# Patient Record
Sex: Female | Born: 1960
Health system: Southern US, Community
[De-identification: ages and names within clinical notes are randomized; demographics above are authoritative.]

## PROBLEM LIST (undated history)

## (undated) DIAGNOSIS — T7840XA Allergy, unspecified, initial encounter: Secondary | ICD-10-CM

## (undated) DIAGNOSIS — D682 Hereditary deficiency of other clotting factors: Secondary | ICD-10-CM

## (undated) DIAGNOSIS — M1990 Unspecified osteoarthritis, unspecified site: Secondary | ICD-10-CM

## (undated) DIAGNOSIS — T4145XA Adverse effect of unspecified anesthetic, initial encounter: Secondary | ICD-10-CM

## (undated) DIAGNOSIS — N3281 Overactive bladder: Secondary | ICD-10-CM

## (undated) DIAGNOSIS — J45909 Unspecified asthma, uncomplicated: Secondary | ICD-10-CM

## (undated) DIAGNOSIS — E785 Hyperlipidemia, unspecified: Secondary | ICD-10-CM

## (undated) DIAGNOSIS — E119 Type 2 diabetes mellitus without complications: Secondary | ICD-10-CM

## (undated) DIAGNOSIS — K219 Gastro-esophageal reflux disease without esophagitis: Secondary | ICD-10-CM

## (undated) DIAGNOSIS — F3289 Other specified depressive episodes: Secondary | ICD-10-CM

## (undated) DIAGNOSIS — G5601 Carpal tunnel syndrome, right upper limb: Secondary | ICD-10-CM

## (undated) DIAGNOSIS — I10 Essential (primary) hypertension: Secondary | ICD-10-CM

## (undated) DIAGNOSIS — R011 Cardiac murmur, unspecified: Secondary | ICD-10-CM

## (undated) DIAGNOSIS — F329 Major depressive disorder, single episode, unspecified: Secondary | ICD-10-CM

## (undated) DIAGNOSIS — R0600 Dyspnea, unspecified: Secondary | ICD-10-CM

## (undated) DIAGNOSIS — T8859XA Other complications of anesthesia, initial encounter: Secondary | ICD-10-CM

## (undated) DIAGNOSIS — G4733 Obstructive sleep apnea (adult) (pediatric): Secondary | ICD-10-CM

## (undated) DIAGNOSIS — R51 Headache: Secondary | ICD-10-CM

## (undated) DIAGNOSIS — R0609 Other forms of dyspnea: Secondary | ICD-10-CM

## (undated) DIAGNOSIS — G4730 Sleep apnea, unspecified: Secondary | ICD-10-CM

## (undated) DIAGNOSIS — I341 Nonrheumatic mitral (valve) prolapse: Secondary | ICD-10-CM

## (undated) DIAGNOSIS — Z9989 Dependence on other enabling machines and devices: Secondary | ICD-10-CM

## (undated) DIAGNOSIS — F4310 Post-traumatic stress disorder, unspecified: Secondary | ICD-10-CM

## (undated) HISTORY — DX: Other specified depressive episodes: F32.89

## (undated) HISTORY — DX: Major depressive disorder, single episode, unspecified: F32.9

## (undated) HISTORY — DX: Nonrheumatic mitral (valve) prolapse: I34.1

## (undated) HISTORY — DX: Essential (primary) hypertension: I10

## (undated) HISTORY — DX: Gastro-esophageal reflux disease without esophagitis: K21.9

## (undated) HISTORY — DX: Unspecified osteoarthritis, unspecified site: M19.90

## (undated) HISTORY — PX: LASIK: SHX215

## (undated) HISTORY — DX: Allergy, unspecified, initial encounter: T78.40XA

---

## 1979-05-12 HISTORY — PX: VAGINAL HYSTERECTOMY: SUR661

## 2000-01-23 ENCOUNTER — Other Ambulatory Visit: Admission: RE | Admit: 2000-01-23 | Discharge: 2000-01-23 | Payer: Self-pay | Admitting: Internal Medicine

## 2001-04-07 ENCOUNTER — Other Ambulatory Visit: Admission: RE | Admit: 2001-04-07 | Discharge: 2001-04-07 | Payer: Self-pay | Admitting: Obstetrics & Gynecology

## 2001-04-23 ENCOUNTER — Encounter: Admission: RE | Admit: 2001-04-23 | Discharge: 2001-04-23 | Payer: Self-pay | Admitting: Obstetrics & Gynecology

## 2001-04-23 ENCOUNTER — Encounter: Payer: Self-pay | Admitting: Obstetrics & Gynecology

## 2001-04-25 ENCOUNTER — Ambulatory Visit (HOSPITAL_BASED_OUTPATIENT_CLINIC_OR_DEPARTMENT_OTHER): Admission: RE | Admit: 2001-04-25 | Discharge: 2001-04-25 | Payer: Self-pay | Admitting: *Deleted

## 2001-04-25 ENCOUNTER — Encounter: Admission: RE | Admit: 2001-04-25 | Discharge: 2001-04-25 | Payer: Self-pay | Admitting: *Deleted

## 2001-04-25 ENCOUNTER — Encounter (INDEPENDENT_AMBULATORY_CARE_PROVIDER_SITE_OTHER): Payer: Self-pay | Admitting: *Deleted

## 2001-04-25 HISTORY — PX: BREAST BIOPSY: SHX20

## 2001-08-20 ENCOUNTER — Encounter: Payer: Self-pay | Admitting: Obstetrics & Gynecology

## 2001-08-27 ENCOUNTER — Inpatient Hospital Stay (HOSPITAL_COMMUNITY): Admission: RE | Admit: 2001-08-27 | Discharge: 2001-08-29 | Payer: Self-pay | Admitting: Obstetrics & Gynecology

## 2001-08-27 ENCOUNTER — Encounter (INDEPENDENT_AMBULATORY_CARE_PROVIDER_SITE_OTHER): Payer: Self-pay | Admitting: Specialist

## 2001-08-27 HISTORY — PX: TOTAL ABDOMINAL HYSTERECTOMY: SHX209

## 2001-08-27 HISTORY — PX: PUBOVAGINAL SLING: SHX1035

## 2001-12-19 ENCOUNTER — Encounter: Payer: Self-pay | Admitting: Internal Medicine

## 2001-12-19 ENCOUNTER — Emergency Department (HOSPITAL_COMMUNITY): Admission: EM | Admit: 2001-12-19 | Discharge: 2001-12-19 | Payer: Self-pay | Admitting: Internal Medicine

## 2002-03-29 ENCOUNTER — Emergency Department (HOSPITAL_COMMUNITY): Admission: EM | Admit: 2002-03-29 | Discharge: 2002-03-29 | Payer: Self-pay | Admitting: Emergency Medicine

## 2002-09-10 HISTORY — PX: SHOULDER ARTHROSCOPY W/ ROTATOR CUFF REPAIR: SHX2400

## 2004-08-17 ENCOUNTER — Ambulatory Visit: Payer: Self-pay | Admitting: Internal Medicine

## 2005-02-13 ENCOUNTER — Ambulatory Visit: Payer: Self-pay | Admitting: Internal Medicine

## 2005-12-20 ENCOUNTER — Ambulatory Visit: Payer: Self-pay | Admitting: Internal Medicine

## 2006-04-08 ENCOUNTER — Ambulatory Visit: Payer: Self-pay | Admitting: Internal Medicine

## 2006-10-09 ENCOUNTER — Ambulatory Visit: Payer: Self-pay | Admitting: Internal Medicine

## 2006-10-09 LAB — CONVERTED CEMR LAB
ALT: 31 units/L (ref 0–40)
AST: 25 units/L (ref 0–37)
Albumin: 3.9 g/dL (ref 3.5–5.2)
Alkaline Phosphatase: 60 units/L (ref 39–117)
BUN: 9 mg/dL (ref 6–23)
Basophils Absolute: 0 10*3/uL (ref 0.0–0.1)
Basophils Relative: 0.2 % (ref 0.0–1.0)
Bilirubin, Direct: 0.1 mg/dL (ref 0.0–0.3)
CO2: 29 meq/L (ref 19–32)
Calcium: 9.6 mg/dL (ref 8.4–10.5)
Chloride: 104 meq/L (ref 96–112)
Cholesterol: 250 mg/dL (ref 0–200)
Creatinine, Ser: 0.8 mg/dL (ref 0.4–1.2)
Direct LDL: 145 mg/dL
Eosinophils Absolute: 0.2 10*3/uL (ref 0.0–0.6)
Eosinophils Relative: 2.3 % (ref 0.0–5.0)
GFR calc Af Amer: 99 mL/min
GFR calc non Af Amer: 82 mL/min
Glucose, Bld: 123 mg/dL — ABNORMAL HIGH (ref 70–99)
HCT: 41.3 % (ref 36.0–46.0)
Hemoglobin: 14.5 g/dL (ref 12.0–15.0)
Lymphocytes Relative: 35.5 % (ref 12.0–46.0)
MCHC: 34.9 g/dL (ref 30.0–36.0)
MCV: 89.1 fL (ref 78.0–100.0)
Monocytes Absolute: 0.7 10*3/uL (ref 0.2–0.7)
Monocytes Relative: 7.8 % (ref 3.0–11.0)
Neutro Abs: 4.6 10*3/uL (ref 1.4–7.7)
Neutrophils Relative %: 54.2 % (ref 43.0–77.0)
Platelets: 263 10*3/uL (ref 150–400)
Potassium: 3.7 meq/L (ref 3.5–5.1)
RBC: 4.64 M/uL (ref 3.87–5.11)
RDW: 12.3 % (ref 11.5–14.6)
Sodium: 138 meq/L (ref 135–145)
TSH: 3.19 microintl units/mL (ref 0.35–5.50)
Total Bilirubin: 0.5 mg/dL (ref 0.3–1.2)
Total Protein: 6.7 g/dL (ref 6.0–8.3)
WBC: 8.6 10*3/uL (ref 4.5–10.5)

## 2006-11-27 ENCOUNTER — Ambulatory Visit: Payer: Self-pay | Admitting: Internal Medicine

## 2007-04-21 ENCOUNTER — Ambulatory Visit: Payer: Self-pay | Admitting: Internal Medicine

## 2007-04-21 DIAGNOSIS — M25519 Pain in unspecified shoulder: Secondary | ICD-10-CM | POA: Insufficient documentation

## 2007-04-21 DIAGNOSIS — I10 Essential (primary) hypertension: Secondary | ICD-10-CM | POA: Insufficient documentation

## 2007-04-22 ENCOUNTER — Encounter: Payer: Self-pay | Admitting: Internal Medicine

## 2007-09-23 ENCOUNTER — Telehealth: Payer: Self-pay | Admitting: Internal Medicine

## 2007-09-25 ENCOUNTER — Telehealth (INDEPENDENT_AMBULATORY_CARE_PROVIDER_SITE_OTHER): Payer: Self-pay | Admitting: *Deleted

## 2007-10-17 ENCOUNTER — Telehealth: Payer: Self-pay | Admitting: Internal Medicine

## 2007-10-21 ENCOUNTER — Encounter (HOSPITAL_COMMUNITY): Admission: RE | Admit: 2007-10-21 | Discharge: 2007-11-20 | Payer: Self-pay | Admitting: Orthopedic Surgery

## 2007-10-27 ENCOUNTER — Telehealth: Payer: Self-pay | Admitting: Internal Medicine

## 2007-10-30 ENCOUNTER — Ambulatory Visit: Payer: Self-pay | Admitting: Internal Medicine

## 2007-10-31 ENCOUNTER — Ambulatory Visit: Payer: Self-pay | Admitting: Internal Medicine

## 2007-10-31 DIAGNOSIS — I10 Essential (primary) hypertension: Secondary | ICD-10-CM | POA: Insufficient documentation

## 2007-10-31 DIAGNOSIS — F339 Major depressive disorder, recurrent, unspecified: Secondary | ICD-10-CM | POA: Insufficient documentation

## 2007-10-31 DIAGNOSIS — E7800 Pure hypercholesterolemia, unspecified: Secondary | ICD-10-CM | POA: Insufficient documentation

## 2008-03-16 ENCOUNTER — Ambulatory Visit: Payer: Self-pay | Admitting: Internal Medicine

## 2008-03-16 DIAGNOSIS — N3000 Acute cystitis without hematuria: Secondary | ICD-10-CM | POA: Insufficient documentation

## 2008-03-16 LAB — CONVERTED CEMR LAB
Bilirubin Urine: NEGATIVE
Glucose, Urine, Semiquant: NEGATIVE
Ketones, urine, test strip: NEGATIVE
Nitrite: NEGATIVE
Protein, U semiquant: 30
Specific Gravity, Urine: 1.01
Urobilinogen, UA: NEGATIVE
pH: 5

## 2008-03-25 ENCOUNTER — Telehealth (INDEPENDENT_AMBULATORY_CARE_PROVIDER_SITE_OTHER): Payer: Self-pay | Admitting: *Deleted

## 2008-04-01 ENCOUNTER — Ambulatory Visit (HOSPITAL_BASED_OUTPATIENT_CLINIC_OR_DEPARTMENT_OTHER): Admission: RE | Admit: 2008-04-01 | Discharge: 2008-04-01 | Payer: Self-pay | Admitting: Orthopedic Surgery

## 2008-04-01 HISTORY — PX: KNEE ARTHROSCOPY: SUR90

## 2008-04-21 ENCOUNTER — Ambulatory Visit: Payer: Self-pay | Admitting: Internal Medicine

## 2008-04-21 LAB — CONVERTED CEMR LAB
ALT: 29 units/L (ref 0–35)
AST: 26 units/L (ref 0–37)
Albumin: 4 g/dL (ref 3.5–5.2)
Alkaline Phosphatase: 56 units/L (ref 39–117)
BUN: 15 mg/dL (ref 6–23)
Basophils Absolute: 0 10*3/uL (ref 0.0–0.1)
Basophils Relative: 0.3 % (ref 0.0–3.0)
Bilirubin Urine: NEGATIVE
Bilirubin, Direct: 0.1 mg/dL (ref 0.0–0.3)
Blood in Urine, dipstick: NEGATIVE
CO2: 29 meq/L (ref 19–32)
Calcium: 9.7 mg/dL (ref 8.4–10.5)
Chloride: 103 meq/L (ref 96–112)
Cholesterol: 183 mg/dL (ref 0–200)
Creatinine, Ser: 1 mg/dL (ref 0.4–1.2)
Direct LDL: 113.3 mg/dL
Eosinophils Absolute: 0.3 10*3/uL (ref 0.0–0.7)
Eosinophils Relative: 3.4 % (ref 0.0–5.0)
GFR calc Af Amer: 76 mL/min
GFR calc non Af Amer: 63 mL/min
Glucose, Bld: 121 mg/dL — ABNORMAL HIGH (ref 70–99)
Glucose, Urine, Semiquant: NEGATIVE
HCT: 40.9 % (ref 36.0–46.0)
HDL: 41.5 mg/dL (ref >39.0)
Hemoglobin: 14.2 g/dL (ref 12.0–15.0)
Ketones, urine, test strip: NEGATIVE
Lymphocytes Relative: 46.4 % — ABNORMAL HIGH (ref 12.0–46.0)
MCHC: 34.7 g/dL (ref 30.0–36.0)
MCV: 89 fL (ref 78.0–100.0)
Monocytes Absolute: 0.7 10*3/uL (ref 0.1–1.0)
Monocytes Relative: 7.3 % (ref 3.0–12.0)
Neutro Abs: 3.9 10*3/uL (ref 1.4–7.7)
Neutrophils Relative %: 42.6 % — ABNORMAL LOW (ref 43.0–77.0)
Nitrite: NEGATIVE
Platelets: 249 10*3/uL (ref 150–400)
Potassium: 3.8 meq/L (ref 3.5–5.1)
Protein, U semiquant: NEGATIVE
RBC: 4.59 M/uL (ref 3.87–5.11)
RDW: 12.4 % (ref 11.5–14.6)
Sodium: 143 meq/L (ref 135–145)
Specific Gravity, Urine: 1.02
TSH: 5.68 microintl units/mL — ABNORMAL HIGH (ref 0.35–5.50)
Total Bilirubin: 0.7 mg/dL (ref 0.3–1.2)
Total CHOL/HDL Ratio: 4.4
Total Protein: 6.9 g/dL (ref 6.0–8.3)
Triglycerides: 201 mg/dL (ref 0–149)
Urobilinogen, UA: 0.2
VLDL: 40 mg/dL (ref 0–40)
WBC Urine, dipstick: NEGATIVE
WBC: 9.2 10*3/uL (ref 4.5–10.5)
pH: 5

## 2008-04-29 ENCOUNTER — Ambulatory Visit: Payer: Self-pay | Admitting: Internal Medicine

## 2008-08-02 ENCOUNTER — Telehealth: Payer: Self-pay | Admitting: Internal Medicine

## 2008-08-10 ENCOUNTER — Ambulatory Visit: Payer: Self-pay | Admitting: Internal Medicine

## 2008-08-10 DIAGNOSIS — M1990 Unspecified osteoarthritis, unspecified site: Secondary | ICD-10-CM | POA: Insufficient documentation

## 2008-12-13 ENCOUNTER — Ambulatory Visit: Payer: Self-pay | Admitting: Internal Medicine

## 2008-12-13 LAB — CONVERTED CEMR LAB: Blood Glucose, Fingerstick: 121

## 2009-04-22 ENCOUNTER — Ambulatory Visit: Payer: Self-pay | Admitting: Internal Medicine

## 2009-04-22 LAB — CONVERTED CEMR LAB
ALT: 35 units/L (ref 0–35)
AST: 29 units/L (ref 0–37)
Albumin: 4.2 g/dL (ref 3.5–5.2)
BUN: 13 mg/dL (ref 6–23)
Bilirubin Urine: NEGATIVE
Chloride: 106 meq/L (ref 96–112)
Cholesterol: 157 mg/dL (ref 0–200)
Eosinophils Relative: 3 % (ref 0.0–5.0)
GFR calc non Af Amer: 70.35 mL/min (ref >60)
Glucose, Bld: 149 mg/dL — ABNORMAL HIGH (ref 70–99)
Glucose, Urine, Semiquant: NEGATIVE
HCT: 42.7 % (ref 36.0–46.0)
Hemoglobin: 14.6 g/dL (ref 12.0–15.0)
Lymphs Abs: 2.5 10*3/uL (ref 0.7–4.0)
MCV: 89.2 fL (ref 78.0–100.0)
Monocytes Absolute: 0.5 10*3/uL (ref 0.1–1.0)
Monocytes Relative: 7.3 % (ref 3.0–12.0)
Neutro Abs: 4 10*3/uL (ref 1.4–7.7)
Platelets: 238 10*3/uL (ref 150.0–400.0)
Potassium: 4.1 meq/L (ref 3.5–5.1)
RDW: 12.3 % (ref 11.5–14.6)
Sodium: 141 meq/L (ref 135–145)
TSH: 2.07 microintl units/mL (ref 0.35–5.50)
Total Bilirubin: 0.7 mg/dL (ref 0.3–1.2)
WBC Urine, dipstick: NEGATIVE
WBC: 7.2 10*3/uL (ref 4.5–10.5)
pH: 7

## 2009-05-12 ENCOUNTER — Ambulatory Visit: Payer: Self-pay | Admitting: Internal Medicine

## 2009-11-10 ENCOUNTER — Ambulatory Visit: Payer: Self-pay | Admitting: Internal Medicine

## 2009-11-10 DIAGNOSIS — G4700 Insomnia, unspecified: Secondary | ICD-10-CM | POA: Insufficient documentation

## 2009-11-10 LAB — CONVERTED CEMR LAB: Blood Glucose, Fingerstick: 128

## 2010-01-31 ENCOUNTER — Ambulatory Visit: Payer: Self-pay | Admitting: Internal Medicine

## 2010-01-31 DIAGNOSIS — J029 Acute pharyngitis, unspecified: Secondary | ICD-10-CM | POA: Insufficient documentation

## 2010-05-18 ENCOUNTER — Telehealth: Payer: Self-pay | Admitting: Internal Medicine

## 2010-07-17 ENCOUNTER — Ambulatory Visit: Payer: Self-pay | Admitting: Internal Medicine

## 2010-07-17 LAB — CONVERTED CEMR LAB
ALT: 26 units/L (ref 0–35)
Alkaline Phosphatase: 71 units/L (ref 39–117)
BUN: 12 mg/dL (ref 6–23)
Basophils Relative: 0.5 % (ref 0.0–3.0)
Bilirubin, Direct: 0 mg/dL (ref 0.0–0.3)
Calcium: 9.8 mg/dL (ref 8.4–10.5)
Cholesterol: 185 mg/dL (ref 0–200)
Creatinine, Ser: 0.9 mg/dL (ref 0.4–1.2)
Eosinophils Relative: 3.1 % (ref 0.0–5.0)
GFR calc non Af Amer: 74.78 mL/min (ref >60)
Hemoglobin, Urine: NEGATIVE
Ketones, ur: NEGATIVE mg/dL
LDL Cholesterol: 114 mg/dL — ABNORMAL HIGH (ref 0–99)
Leukocytes, UA: NEGATIVE
Lymphocytes Relative: 33.8 % (ref 12.0–46.0)
MCV: 88.5 fL (ref 78.0–100.0)
Monocytes Absolute: 0.6 10*3/uL (ref 0.1–1.0)
Monocytes Relative: 6.9 % (ref 3.0–12.0)
Neutrophils Relative %: 55.7 % (ref 43.0–77.0)
Platelets: 273 10*3/uL (ref 150.0–400.0)
RBC: 4.69 M/uL (ref 3.87–5.11)
Total Bilirubin: 0.6 mg/dL (ref 0.3–1.2)
Total CHOL/HDL Ratio: 4
Urine Glucose: NEGATIVE mg/dL
Urobilinogen, UA: 0.2 (ref 0.0–1.0)
VLDL: 25.4 mg/dL (ref 0.0–40.0)
WBC: 8.2 10*3/uL (ref 4.5–10.5)

## 2010-08-01 ENCOUNTER — Ambulatory Visit: Payer: Self-pay | Admitting: Internal Medicine

## 2010-10-10 NOTE — Assessment & Plan Note (Signed)
Summary: congestion//ccm   Vital Signs:  Patient profile:   50 year old female Weight:      223 pounds Temp:     98.3 degrees F oral BP sitting:   100 / 70  (right arm) Cuff size:   regular  Vitals Entered By: Duard Brady LPN (Jan 31, 2010 4:30 PM) CC: c/o sore throat , ear pain , bodyaches Is Patient Diabetic? No   CC:  c/o sore throat , ear pain , and bodyaches.  History of Present Illness: 50 year old patient who is seen today with a one-week history of sore throat and ear pain.  She has had generalized myalgias, but no documented fever.  She has a history of depression, which has been stable.  She is treated hypertension, and dyslipidemia.  Denies any cough, shortness of breath or chest pain, rapid strep screen negative  Preventive Screening-Counseling & Management  Alcohol-Tobacco     Smoking Status: never  Allergies (verified): No Known Drug Allergies  Past History:  Past Medical History: Reviewed history from 12/13/2008 and no changes required. Depression Hyperlipidemia Hypertension Osteoarthritis impaired glucose tolerance  Review of Systems       The patient complains of anorexia, muscle weakness, and enlarged lymph nodes.  The patient denies fever, weight loss, weight gain, vision loss, decreased hearing, hoarseness, chest pain, syncope, dyspnea on exertion, peripheral edema, prolonged cough, headaches, hemoptysis, abdominal pain, melena, hematochezia, severe indigestion/heartburn, hematuria, incontinence, genital sores, suspicious skin lesions, transient blindness, difficulty walking, depression, unusual weight change, abnormal bleeding, angioedema, and breast masses.    Physical Exam  General:  overweight-appearing.  low-normal blood pressure Head:  Normocephalic and atraumatic without obvious abnormalities. No apparent alopecia or balding. Eyes:  No corneal or conjunctival inflammation noted. EOMI. Perrla. Funduscopic exam benign, without  hemorrhages, exudates or papilledema. Vision grossly normal. Ears:  both tympanic membranes and canals normal Nose:  External nasal examination shows no deformity or inflammation. Nasal mucosa are pink and moist without lesions or exudates. Mouth:  pharyngeal erythema.   Neck:  mild anterior cervical adenopathy Lungs:  Normal respiratory effort, chest expands symmetrically. Lungs are clear to auscultation, no crackles or wheezes. Heart:  Normal rate and regular rhythm. S1 and S2 normal without gallop, murmur, click, rub or other extra sounds.   Impression & Recommendations:  Problem # 1:  SORE THROAT (ICD-462)  Her updated medication list for this problem includes:    Meloxicam 15 Mg Tabs (Meloxicam) ..... One daily  Orders: Rapid Strep (14782)  Problem # 2:  HYPERTENSION (ICD-401.9)  Her updated medication list for this problem includes:    Lisinopril-hydrochlorothiazide 20-25 Mg Tabs (Lisinopril-hydrochlorothiazide) ..... One daily  Complete Medication List: 1)  Zoloft 100 Mg Tabs (sertraline Hcl)  .Marland Kitchen.. 1 once daily 2)  Lipitor 20 Mg Tabs (Atorvastatin calcium) .... 1/2 once daily 3)  Meloxicam 15 Mg Tabs (Meloxicam) .... One daily 4)  Lorazepam 0.5 Mg Tabs (Lorazepam) .Marland Kitchen.. 1 two times a day as needed 5)  Oxybutynin Chloride 15 Mg Xr24h-tab (Oxybutynin chloride) .... Take one tab once daily 6)  Lisinopril-hydrochlorothiazide 20-25 Mg Tabs (Lisinopril-hydrochlorothiazide) .... One daily 7)  Zolpidem Tartrate 10 Mg Tabs (Zolpidem tartrate) .... One at bedtime as needed for sleep 8)  Hydrocodone-acetaminophen 5-500 Mg Tabs (Hydrocodone-acetaminophen) .... One every 6 hours as needed for pain  Patient Instructions: 1)  Please schedule a follow-up appointment as needed. 2)  Limit your Sodium (Salt) to less than 2 grams a day(slightly less than 1/2 a teaspoon) to prevent fluid  retention, swelling, or worsening of symptoms. 3)  Please schedule a follow-up appointment in 6  months. Prescriptions: HYDROCODONE-ACETAMINOPHEN 5-500 MG TABS (HYDROCODONE-ACETAMINOPHEN) one every 6 hours as needed for pain  #40 x 0   Entered and Authorized by:   Gordy Savers  MD   Signed by:   Gordy Savers  MD on 01/31/2010   Method used:   Print then Give to Patient   RxID:   [id number]   Appended Document: congestion//ccm     Allergies: No Known Drug Allergies   Complete Medication List: 1)  Zoloft 100 Mg Tabs (sertraline Hcl)  .Marland Kitchen.. 1 once daily 2)  Lipitor 20 Mg Tabs (Atorvastatin calcium) .... 1/2 once daily 3)  Meloxicam 15 Mg Tabs (Meloxicam) .... One daily 4)  Lorazepam 0.5 Mg Tabs (Lorazepam) .Marland Kitchen.. 1 two times a day as needed 5)  Oxybutynin Chloride 15 Mg Xr24h-tab (Oxybutynin chloride) .... Take one tab once daily 6)  Lisinopril-hydrochlorothiazide 20-25 Mg Tabs (Lisinopril-hydrochlorothiazide) .... One daily 7)  Zolpidem Tartrate 10 Mg Tabs (Zolpidem tartrate) .... One at bedtime as needed for sleep 8)  Hydrocodone-acetaminophen 5-500 Mg Tabs (Hydrocodone-acetaminophen) .... One every 6 hours as needed for pain   Laboratory Results    Other Tests  Rapid Strep: negative

## 2010-10-10 NOTE — Assessment & Plan Note (Signed)
Summary: 6 mo rov/mm   Vital Signs:  Patient profile:   50 year old female Weight:      229 pounds Temp:     98.1 degrees F oral BP sitting:   110 / 70  (left arm) Cuff size:   large  Vitals Entered By: Duard Brady LPN (November 10, 8117 10:10 AM) CC: 6 mos rov / doing ok , c/o insomnia , bilat thumb pain Is Patient Diabetic? No CBG Result 128   CC:  6 mos rov / doing ok , c/o insomnia , and bilat thumb pain.  History of Present Illness: 50 year old patient who is in today for follow-up of her dyslipidemiaand hypertension.  She has a chief complaint of insomnia.  This has been present for at least 6 months.  She has a history of anxiety, depression, which has been stable.  She was seen 6 months ago for a physical and lab reviewed  Preventive Screening-Counseling & Management  Alcohol-Tobacco     Smoking Status: never  Allergies (verified): No Known Drug Allergies  Past History:  Past Medical History: Reviewed history from 12/13/2008 and no changes required. Depression Hyperlipidemia Hypertension Osteoarthritis impaired glucose tolerance  Social History: Smoking Status:  never  Review of Systems  The patient denies anorexia, fever, weight loss, weight gain, vision loss, decreased hearing, hoarseness, chest pain, syncope, dyspnea on exertion, peripheral edema, prolonged cough, headaches, hemoptysis, abdominal pain, melena, hematochezia, severe indigestion/heartburn, hematuria, incontinence, genital sores, muscle weakness, suspicious skin lesions, transient blindness, difficulty walking, depression, unusual weight change, abnormal bleeding, enlarged lymph nodes, angioedema, and breast masses.    Physical Exam  General:  overweight-appearing.  low normal blood pressureoverweight-appearing.   Head:  Normocephalic and atraumatic without obvious abnormalities. No apparent alopecia or balding. Eyes:  No corneal or conjunctival inflammation noted. EOMI. Perrla.  Funduscopic exam benign, without hemorrhages, exudates or papilledema. Vision grossly normal. Mouth:  Oral mucosa and oropharynx without lesions or exudates.  Teeth in good repair. Neck:  No deformities, masses, or tenderness noted. Lungs:  Normal respiratory effort, chest expands symmetrically. Lungs are clear to auscultation, no crackles or wheezes. Heart:  Normal rate and regular rhythm. S1 and S2 normal without gallop, murmur, click, rub or other extra sounds. Msk:  No deformity or scoliosis noted of thoracic or lumbar spine.   Skin:  Intact without suspicious lesions or rashes Cervical Nodes:  No lymphadenopathy noted   Impression & Recommendations:  Problem # 1:  IMPAIRED GLUCOSE TOLERANCE (ICD-271.3)  Problem # 2:  HYPERTENSION (ICD-401.9)  The following medications were removed from the medication list:    Benazepril-hydrochlorothiazide 20-25 Mg Tabs (Benazepril-hydrochlorothiazide) .Marland Kitchen... 1 once daily Her updated medication list for this problem includes:    Lisinopril-hydrochlorothiazide 20-25 Mg Tabs (Lisinopril-hydrochlorothiazide) ..... One daily  The following medications were removed from the medication list:    Benazepril-hydrochlorothiazide 20-25 Mg Tabs (Benazepril-hydrochlorothiazide) .Marland Kitchen... 1 once daily Her updated medication list for this problem includes:    Lisinopril-hydrochlorothiazide 20-25 Mg Tabs (Lisinopril-hydrochlorothiazide) ..... One daily  Problem # 3:  HYPERLIPIDEMIA (ICD-272.4)  Her updated medication list for this problem includes:    Lipitor 20 Mg Tabs (Atorvastatin calcium) .Marland Kitchen... 1/2 once daily  Her updated medication list for this problem includes:    Lipitor 20 Mg Tabs (Atorvastatin calcium) .Marland Kitchen... 1/2 once daily  Problem # 4:  INSOMNIA (ICD-780.52)  Her updated medication list for this problem includes:    Zolpidem Tartrate 10 Mg Tabs (Zolpidem tartrate) ..... One at bedtime as needed for  sleep  Complete Medication List: 1)  Zoloft 100 Mg  Tabs (sertraline Hcl)  .Marland Kitchen.. 1 once daily 2)  Lipitor 20 Mg Tabs (Atorvastatin calcium) .... 1/2 once daily 3)  Meloxicam 15 Mg Tabs (Meloxicam) .... One daily 4)  Lorazepam 0.5 Mg Tabs (Lorazepam) .Marland Kitchen.. 1 two times a day as needed 5)  Oxybutynin Chloride 15 Mg Xr24h-tab (Oxybutynin chloride) .... Take one tab once daily 6)  Lisinopril-hydrochlorothiazide 20-25 Mg Tabs (Lisinopril-hydrochlorothiazide) .... One daily 7)  Zolpidem Tartrate 10 Mg Tabs (Zolpidem tartrate) .... One at bedtime as needed for sleep  Other Orders: Capillary Blood Glucose/CBG (04540)  Patient Instructions: 1)  Please schedule a follow-up appointment in 6 months. 2)  Limit your Sodium (Salt). 3)  It is important that you exercise regularly at least 20 minutes 5 times a week. If you develop chest pain, have severe difficulty breathing, or feel very tired , stop exercising immediately and seek medical attention. 4)  You need to lose weight. Consider a lower calorie diet and regular exercise.  5)  Check your Blood Pressure regularly. If it is above: 140/90  you should make an appointment. Prescriptions: ZOLPIDEM TARTRATE 10 MG TABS (ZOLPIDEM TARTRATE) one at bedtime as needed for sleep  #30 x 3   Entered and Authorized by:   Gordy Savers  MD   Signed by:   Gordy Savers  MD on 11/10/2009   Method used:   Print then Give to Patient   RxID:   [id number] LISINOPRIL-HYDROCHLOROTHIAZIDE 20-25 MG TABS (LISINOPRIL-HYDROCHLOROTHIAZIDE) one daily  #90 x 6   Entered and Authorized by:   Gordy Savers  MD   Signed by:   Gordy Savers  MD on 11/10/2009   Method used:   Print then Give to Patient   RxID:   [id number] OXYBUTYNIN CHLORIDE 15 MG XR24H-TAB (OXYBUTYNIN CHLORIDE) take one tab once daily  #90 x 6   Entered and Authorized by:   Gordy Savers  MD   Signed by:   Gordy Savers  MD on 11/10/2009   Method used:   Print then Give to Patient   RxID:   [id number] LORAZEPAM  0.5 MG  TABS (LORAZEPAM) 1 two times a day as needed  #90 x 4   Entered and Authorized by:   Gordy Savers  MD   Signed by:   Gordy Savers  MD on 11/10/2009   Method used:   Print then Give to Patient   RxID:   [id number] MELOXICAM 15 MG  TABS (MELOXICAM) one daily  #90 x 6   Entered and Authorized by:   Gordy Savers  MD   Signed by:   Gordy Savers  MD on 11/10/2009   Method used:   Print then Give to Patient   RxID:   [id number] LIPITOR 20 MG  TABS (ATORVASTATIN CALCIUM) 1/2 once daily  #90 x 6   Entered and Authorized by:   Gordy Savers  MD   Signed by:   Gordy Savers  MD on 11/10/2009   Method used:   Print then Give to Patient   RxID:   [id number] ZOLOFT 100 MG  TABS (SERTRALINE HCL) 1 once daily  #90 x 6   Entered and Authorized by:   Gordy Savers  MD   Signed by:   Gordy Savers  MD on 11/10/2009   Method used:   Print then Give to Patient   RxID:  [id number]  

## 2010-10-10 NOTE — Progress Notes (Signed)
Summary: missed appt  Phone Note Outgoing Call   Call placed by: Duard Brady LPN,  May 18, 2010 9:01 AM Call placed to: Patient Summary of Call: no call no show 6 mos rov - attempt to call - no ans no mach. - Looking in chart was seen 02/04/10 ,instructed to rov in 6 mos. not due untill Nov. KIK Initial call taken by: Duard Brady LPN,  May 18, 2010 9:02 AM

## 2010-10-10 NOTE — Assessment & Plan Note (Signed)
Summary: ROV/MM   Vital Signs:  Patient Profile:   50 Years Old Female Temp:     98.2 degrees F BP sitting:   98 / 74  (left arm) Cuff size:   large  Vitals Entered By: Raechel Ache, RN (April 29, 2008 2:01 PM)                In  Chief Complaint:  ROV and labs done. Has R knee fractures- no weight-bearing x 6 wks..  History of Present Illness: 50 year female seen today for follow-up of her hypertension and depression.  She has treated dyslipidemia, on Lipitor.  In the past 5 weeks.  She has been recovering from orthopedic surgery.  She states she is been more depressed and hypersomnolent and feels that she needs a higher dose of Zoloft.  The symptoms began before her orthopedic surgery and her forced inactivity    Current Allergies: No known allergies   Past Medical History:    Reviewed history from 10/31/2007 and no changes required:       Depression       Hyperlipidemia       Hypertension  Past Surgical History:    Reviewed history from 10/31/2007 and no changes required:       gravida two, para two, abortus zero,        left rotator cuff surgery       status post right knee surgery with meniscectomy and microfractures of the patella   Family History:    Reviewed history from 10/31/2007 and no changes required:       father died age 80, possible MI versus rheumatic fever       mother history of end-stage COPD              One brother died age 73, MI       one sister is well    Review of Systems  The patient denies anorexia, fever, weight loss, weight gain, vision loss, decreased hearing, hoarseness, chest pain, syncope, dyspnea on exertion, peripheral edema, prolonged cough, headaches, hemoptysis, abdominal pain, melena, hematochezia, severe indigestion/heartburn, hematuria, incontinence, genital sores, muscle weakness, suspicious skin lesions, transient blindness, difficulty walking, depression, unusual weight change, abnormal bleeding, enlarged lymph nodes,  angioedema, and breast masses.     Physical Exam  General:     overweight-appearing.  wheelchair-bound Head:     Normocephalic and atraumatic without obvious abnormalities. No apparent alopecia or balding. Eyes:     No corneal or conjunctival inflammation noted. EOMI. Perrla. Funduscopic exam benign, without hemorrhages, exudates or papilledema. Vision grossly normal. Mouth:     Oral mucosa and oropharynx without lesions or exudates.  Teeth in good repair. Neck:     No deformities, masses, or tenderness noted. Lungs:     Normal respiratory effort, chest expands symmetrically. Lungs are clear to auscultation, no crackles or wheezes. Heart:     Normal rate and regular rhythm. S1 and S2 normal without gallop, murmur, click, rub or other extra sounds. Abdomen:     Bowel sounds positive,abdomen soft and non-tender without masses, organomegaly or hernias noted.    Impression & Recommendations:  Problem # 1:  HYPERTENSION (ICD-401.9)  Her updated medication list for this problem includes:    Benazepril-hydrochlorothiazide 20-25 Mg Tabs (Benazepril-hydrochlorothiazide) .Marland Kitchen... 1 once daily   Problem # 2:  HYPERLIPIDEMIA (ICD-272.4)  Her updated medication list for this problem includes:    Lipitor 20 Mg Tabs (Atorvastatin calcium) .Marland Kitchen... 1/2 once daily  Complete Medication List: 1)  Zoloft 100 Mg Tabs (sertraline Hcl)  .Marland Kitchen.. 1 once daily 2)  Detrol La 4 Mg Cp24 (Tolterodine tartrate) .Marland Kitchen.. 1 once daily 3)  Lipitor 20 Mg Tabs (Atorvastatin calcium) .... 1/2 once daily 4)  Meloxicam 15 Mg Tabs (Meloxicam) .... One daily 5)  Lorazepam 0.5 Mg Tabs (Lorazepam) .Marland Kitchen.. 1 two times a day as needed 6)  Benazepril-hydrochlorothiazide 20-25 Mg Tabs (Benazepril-hydrochlorothiazide) .Marland Kitchen.. 1 once daily 7)  Ciprofloxacin Hcl 500 Mg Tabs (Ciprofloxacin hcl) .... One twice daily   Patient Instructions: 1)  Please schedule a follow-up appointment in 3 months. 2)  Limit your Sodium (Salt) to less  than 2 grams a day(slightly less than 1/2 a teaspoon) to prevent fluid retention, swelling, or worsening of symptoms.   Prescriptions: BENAZEPRIL-HYDROCHLOROTHIAZIDE 20-25 MG  TABS (BENAZEPRIL-HYDROCHLOROTHIAZIDE) 1 once daily  #90 x 5   Entered and Authorized by:   Gordy Savers  MD   Signed by:   Gordy Savers  MD on 04/29/2008   Method used:   Print then Give to Patient   RxID:   [id number] LORAZEPAM 0.5 MG  TABS (LORAZEPAM) 1 two times a day as needed  #50 Each x 2   Entered and Authorized by:   Gordy Savers  MD   Signed by:   Gordy Savers  MD on 04/29/2008   Method used:   Print then Give to Patient   RxID:   [id number] MELOXICAM 15 MG  TABS (MELOXICAM) one daily  #90 x 6   Entered and Authorized by:   Gordy Savers  MD   Signed by:   Gordy Savers  MD on 04/29/2008   Method used:   Print then Give to Patient   RxID:   [id number] LIPITOR 20 MG  TABS (ATORVASTATIN CALCIUM) 1/2 once daily  #90 x 6   Entered and Authorized by:   Gordy Savers  MD   Signed by:   Gordy Savers  MD on 04/29/2008   Method used:   Print then Give to Patient   RxID:   [id number] DETROL LA 4 MG  CP24 (TOLTERODINE TARTRATE) 1 once daily  #90 x 6   Entered and Authorized by:   Gordy Savers  MD   Signed by:   Gordy Savers  MD on 04/29/2008   Method used:   Print then Give to Patient   RxID:   [id number] ZOLOFT 100 MG  TABS (SERTRALINE HCL) 1 once daily  #90 x 6   Entered and Authorized by:   Gordy Savers  MD   Signed by:   Gordy Savers  MD on 04/29/2008   Method used:   Print then Give to Patient   RxID:   [id number]  ]

## 2010-10-10 NOTE — Assessment & Plan Note (Signed)
Summary: cpx/flu shot and pneumonia shot/cjr   Vital Signs:  Patient profile:   50 year old female Height:      63 inches Weight:      233 pounds BMI:     41.42 Temp:     98.4 degrees F oral BP sitting:   100 / 64  (left arm) Cuff size:   regular  Vitals Entered By: Duard Brady LPN (August 01, 2010 2:04 PM) CC: cpx - doing well Is Patient Diabetic? No Flu Vaccine Consent Questions     Do you have a history of severe allergic reactions to this vaccine? no    Any prior history of allergic reactions to egg and/or gelatin? no    Do you have a sensitivity to the preservative Thimersol? no    Do you have a past history of Guillan-Barre Syndrome? no    Do you currently have an acute febrile illness? no    Have you ever had a severe reaction to latex? no    Vaccine information given and explained to patient? yes    Are you currently pregnant? no    Lot Number:[id number]   Exp Date:03/10/2011   Site Given  Left Deltoid IM   CC:  cpx - doing well.  History of Present Illness: 50 year old patient who is seen today for follow-upand an annual exam.  Medical problems include impaired glucose tolerance.  Exogenous obesity, hypertension, and dyslipidemia her in she is doing quite well.  No concerns or complaints.  No prior history of screening colonoscopy  Allergies (verified): No Known Drug Allergies  Past History:  Past Medical History: Reviewed history from 12/13/2008 and no changes required. Depression Hyperlipidemia Hypertension Osteoarthritis impaired glucose tolerance  Past Surgical History: gravida two, para two, abortus zero,  left rotator cuff surgery status post right knee surgery with meniscectomy and microfractures of the patella Hysterectomy  Family History: Reviewed history from 05/12/2009 and no changes required. father died age 20, possible MI versus rheumatic fever mother history of end-stage COPD: DM 2 maternal GM- DM 2  One brother died age 45,  MI one sister is well- IGT  Social History: Reviewed history from 05/12/2009 and no changes required. Married Regular exercise-yes  Review of Systems  The patient denies anorexia, fever, weight loss, weight gain, vision loss, decreased hearing, hoarseness, chest pain, syncope, dyspnea on exertion, peripheral edema, prolonged cough, headaches, hemoptysis, abdominal pain, melena, hematochezia, severe indigestion/heartburn, hematuria, incontinence, genital sores, muscle weakness, suspicious skin lesions, transient blindness, difficulty walking, depression, unusual weight change, abnormal bleeding, enlarged lymph nodes, angioedema, and breast masses.    Physical Exam  General:  overweight-appearing.  110/70overweight-appearing.   Head:  Normocephalic and atraumatic without obvious abnormalities. No apparent alopecia or balding. Eyes:  No corneal or conjunctival inflammation noted. EOMI. Perrla. Funduscopic exam benign, without hemorrhages, exudates or papilledema. Vision grossly normal. Ears:  External ear exam shows no significant lesions or deformities.  Otoscopic examination reveals clear canals, tympanic membranes are intact bilaterally without bulging, retraction, inflammation or discharge. Hearing is grossly normal bilaterally. Nose:  External nasal examination shows no deformity or inflammation. Nasal mucosa are pink and moist without lesions or exudates. Mouth:  Oral mucosa and oropharynx without lesions or exudates.  Teeth in good repair. Neck:  No deformities, masses, or tenderness noted. Chest Wall:  No deformities, masses, or tenderness noted. Lungs:  Normal respiratory effort, chest expands symmetrically. Lungs are clear to auscultation, no crackles or wheezes. Heart:  Normal rate and regular rhythm. S1 and  S2 normal without gallop, murmur, click, rub or other extra sounds. Abdomen:  Bowel sounds positive,abdomen soft and non-tender without masses, organomegaly or hernias  noted. Msk:  No deformity or scoliosis noted of thoracic or lumbar spine.   Pulses:  R and L carotid,radial,femoral,dorsalis pedis and posterior tibial pulses are full and equal bilaterally Extremities:  No clubbing, cyanosis, edema, or deformity noted with normal full range of motion of all joints.   Neurologic:  No cranial nerve deficits noted. Station and gait are normal. Plantar reflexes are down-going bilaterally. DTRs are symmetrical throughout. Sensory, motor and coordinative functions appear intact. Skin:  Intact without suspicious lesions or rashes Cervical Nodes:  No lymphadenopathy noted Axillary Nodes:  No palpable lymphadenopathy Inguinal Nodes:  No significant adenopathy Psych:  Cognition and judgment appear intact. Alert and cooperative with normal attention span and concentration. No apparent delusions, illusions, hallucinations   Impression & Recommendations:  Problem # 1:  Preventive Health Care (ICD-V70.0)  Orders: Gastroenterology Referral (GI)  Complete Medication List: 1)  Zoloft 100 Mg Tabs (sertraline Hcl)  .Marland Kitchen.. 1 once daily 2)  Lipitor 20 Mg Tabs (Atorvastatin calcium) .... 1/2 once daily 3)  Meloxicam 15 Mg Tabs (Meloxicam) .... One daily 4)  Lorazepam 0.5 Mg Tabs (Lorazepam) .Marland Kitchen.. 1 two times a day as needed 5)  Oxybutynin Chloride 15 Mg Xr24h-tab (Oxybutynin chloride) .... Take one tab once daily 6)  Lisinopril-hydrochlorothiazide 20-25 Mg Tabs (Lisinopril-hydrochlorothiazide) .... One daily 7)  Zolpidem Tartrate 10 Mg Tabs (Zolpidem tartrate) .... One at bedtime as needed for sleep  Other Orders: Admin 1st Vaccine (34742) Flu Vaccine 51yrs + (59563)  Patient Instructions: 1)  Please schedule a follow-up appointment in 6 months. 2)  Limit your Sodium (Salt). 3)  It is important that you exercise regularly at least 20 minutes 5 times a week. If you develop chest pain, have severe difficulty breathing, or feel very tired , stop exercising immediately and  seek medical attention. 4)  You need to lose weight. Consider a lower calorie diet and regular exercise.  Prescriptions: ZOLPIDEM TARTRATE 10 MG TABS (ZOLPIDEM TARTRATE) one at bedtime as needed for sleep  #30 x 3   Entered and Authorized by:   Gordy Savers  MD   Signed by:   Gordy Savers  MD on 08/01/2010   Method used:   Print then Give to Patient   RxID:   [id number] LISINOPRIL-HYDROCHLOROTHIAZIDE 20-25 MG TABS (LISINOPRIL-HYDROCHLOROTHIAZIDE) one daily  #90 x 6   Entered and Authorized by:   Gordy Savers  MD   Signed by:   Gordy Savers  MD on 08/01/2010   Method used:   Print then Give to Patient   RxID:   [id number] OXYBUTYNIN CHLORIDE 15 MG XR24H-TAB (OXYBUTYNIN CHLORIDE) take one tab once daily  #90 x 6   Entered and Authorized by:   Gordy Savers  MD   Signed by:   Gordy Savers  MD on 08/01/2010   Method used:   Print then Give to Patient   RxID:   [id number] LORAZEPAM 0.5 MG  TABS (LORAZEPAM) 1 two times a day as needed  #90 x 4   Entered and Authorized by:   Gordy Savers  MD   Signed by:   Gordy Savers  MD on 08/01/2010   Method used:   Print then Give to Patient   RxID:   [id number] MELOXICAM 15 MG  TABS (MELOXICAM) one daily  #90 x 6  Entered and Authorized by:   Gordy Savers  MD   Signed by:   Gordy Savers  MD on 08/01/2010   Method used:   Print then Give to Patient   RxID:   [id number] LIPITOR 20 MG  TABS (ATORVASTATIN CALCIUM) 1/2 once daily  #90 x 6   Entered and Authorized by:   Gordy Savers  MD   Signed by:   Gordy Savers  MD on 08/01/2010   Method used:   Print then Give to Patient   RxID:   [id number] ZOLOFT 100 MG  TABS (SERTRALINE HCL) 1 once daily  #90 x 6   Entered and Authorized by:   Gordy Savers  MD   Signed by:   Gordy Savers  MD on 08/01/2010   Method used:   Print then Give to Patient   RxID:    [id number]    Orders Added: 1)  Admin 1st Vaccine [90471] 2)  Flu Vaccine 62yrs + [01027] 3)  Est. Patient 40-64 years [25366] 4)  Gastroenterology Referral [GI]

## 2011-01-02 ENCOUNTER — Encounter: Payer: Self-pay | Admitting: Internal Medicine

## 2011-01-04 ENCOUNTER — Ambulatory Visit (INDEPENDENT_AMBULATORY_CARE_PROVIDER_SITE_OTHER): Payer: BC Managed Care – PPO | Admitting: Internal Medicine

## 2011-01-04 ENCOUNTER — Encounter: Payer: Self-pay | Admitting: Internal Medicine

## 2011-01-04 VITALS — BP 116/60 | HR 100 | Ht 63.0 in | Wt 235.2 lb

## 2011-01-04 DIAGNOSIS — G4700 Insomnia, unspecified: Secondary | ICD-10-CM

## 2011-01-04 DIAGNOSIS — G4733 Obstructive sleep apnea (adult) (pediatric): Secondary | ICD-10-CM

## 2011-01-04 NOTE — Progress Notes (Signed)
  Subjective:    Patient ID: Kayla [name], female    DOB: 18-Jul-1961, 50 y.o.   MRN: [id number]  HPI 53 yoF self referred for sleep medicine evaluation. She describes loud snoring, witnessed respiratory distress and daytme tiredness. Occasional ambien at bedtime. Occasional caffeine in day. No ENT surgery. Has considered sleep an issue for 10 years. See sleep questionnaire.  Review of Systems See HPI Constitutional:   No weight loss, night sweats,  Fevers, chills, fatigue, HEENT:   No headaches,  Difficulty swallowing,  Tooth/dental problems,  Sore throat,                No sneezing, itching, ear ache, nasal congestion, post nasal drip,   CV:  No chest pain,  Orthopnea, PND, swelling in lower extremities, anasarca, dizziness, palpitations  GI  No heartburn, indigestion, abdominal pain, nausea, vomiting, diarrhea, change in bowel habits, loss of appetite  Resp: No shortness of breath with exertion or at rest.  No excess mucus, no productive cough,  No non-productive cough,  No coughing up of blood.  No change in color of mucus.  No wheezing.   Skin: no rash or lesions.  GU: no dysuria, change in color of urine, no urgency or frequency.  No flank pain.  MS:  No joint pain or swelling.  No decreased range of motion.  No back pain.  Psych:  No change in mood or affect. No depression or anxiety.  No memory loss.      Objective:   Physical Exam General- Alert, Oriented, Affect-appropriate, Distress- none acute  Moderately overweight  Skin- rash-none, lesions- none, excoriation- none  Lymphadenopathy- none  Head- atraumatic  Eyes- Gross vision intact, PERRLA, conjunctivae clear secretions  Ears- OK- Hearing, canals, Tm L ,   R ,  Nose- Clear, No- Septal dev, mucus, polyps, erosion, perforation   Throat- Mallampati II-III , mucosa clear , drainage- none, tonsils- atrophic  Neck- flexible , trachea midline, no stridor , thyroid nl, carotid no bruit  Chest - symmetrical  excursion , unlabored     Heart/CV- RRR , no murmur , no gallop  , no rub, nl s1 s2                     - JVD- none , edema- none, stasis changes- none, varices- none     Lung- clear to P&A, wheeze- none, cough- none , dullness-none, rub- none     Chest wall-  Abd- tender-no, distended-no, bowel sounds-present, HSM- no  Br/ Gen/ Rectal- Not done, not indicated  Extrem- cyanosis- none, clubbing, none, atrophy- none, strength- nl  Neuro- grossly intact to observation         Assessment & Plan:

## 2011-01-04 NOTE — Patient Instructions (Signed)
Orders- [facility]  Schedule split protocol NPSG for dx of obstructive Sleep Apnea

## 2011-01-04 NOTE — Assessment & Plan Note (Signed)
She gives a very good history for OSA, especially concerning because of family hx of early cardiac death in father and brother. We discussed the physiology of OSA, compared simple snoring, reviewed medical concerns and the testing process. We will get a formal sleep study.

## 2011-01-07 NOTE — Assessment & Plan Note (Signed)
A sleep study should help understand if she has primary insomnia, or this is secondary to sleep disordered breathing.

## 2011-01-23 NOTE — Op Note (Signed)
NAME:  Kayla [name], Kayla [name] NO.:  [id number]   MEDICAL RECORD NO.:  [id number]          PATIENT TYPE:  AMB   LOCATION:  DSC                          FACILITY:  [facility]   PHYSICIAN:  Loreta Ave, M.D. DATE OF BIRTH:  Apr 12, 1961   DATE OF PROCEDURE:  04/01/2008  DATE OF DISCHARGE:                               OPERATIVE REPORT   PREOPERATIVE DIAGNOSIS:  Right knee medial meniscus tear.   POSTOPERATIVE DIAGNOSIS:  Right knee extensive tearing medial meniscus,  small focal grade 4 lesion, weightbearing dome medial femoral condyle  with chondral loose bodies.  Radial tears, middle third lateral  meniscus.   PROCEDURE:  Right knee exam under anesthesia, arthroscopy, partial  medial and lesser extent lateral meniscectomy.  Removal of chondral  loose bodies.  Chondroplasty microfracture in medial femoral condyle.   SURGEON:  Loreta Ave, MD   ASSISTANT:  Genene Churn. Barry Dienes, [place]   ANESTHESIA:  General.   BLOOD LOSS:  Minimal.   TOURNIQUET:  Not employed.   SPECIMENS:  None.   CULTURES:  None.   COMPLICATIONS:  None.   DRESSINGS:  Soft compressive.   PROCEDURE:  The patient brought to the operating room, placed on the  operating table in supine position.  After adequate anesthesia had been  obtained, knee examined.  Full motion, good stability, positive medial  McMurray's.  Leg holder applied.  Prepped and draped in the usual  sterile fashion.  Three portals created, one superolateral, one each  medial and lateral parapatellar.  Inflow catheter induced.  Knee was  distended.  Arthroscope was induced.  Knee was inspected.  Good  patellofemoral tracking and cartilage.  Cruciate ligaments intact.  Medial meniscus had extensive complex tearing of the entire posterior  third.  Posterior third removed, tapered the intervening meniscus.  Small radial tears in the middle of the lateral meniscus debrided with a  shaver, tapered in smoothly.  Lateral compartment looked  good.  Unfortunately, about a 1-cm diameter grade 4 lesion right in the middle  of the weightbearing dome medial femoral condyle in full extension,  slightly lateral, chondroplasty to a stable surface.  This is where the  chondral loose bodies were coming from.  As it was grade 4 at the base,  it was debrided, then treated with multiple microfracturing.  The fluid  pressure decreased to be sure we had good bleeding.  Once this was  confirmed, the entire knee examined.  No other findings appreciated.  Instruments and fluid removed.  Portals and knee injected with Marcaine.  Portals closed with 4-0 nylon.  Sterile compressive dressing applied.  Anesthesia reversed.  Brought to the recovery room.  Tolerated this  surgery well.  No complications.      Loreta Ave, M.D.  Electronically Signed     DFM/MEDQ  D:  04/01/2008  T:  04/02/2008  Job:  [id number]

## 2011-01-26 NOTE — Op Note (Signed)
[facility]  Patient:    Kayla [name], Kayla [name] Visit Number: [id number] MRN: [id number]          Service Type: Attending:  Vikki Ports, M.D. Proc. Date: 04/25/01                             Operative Report  PREOPERATIVE DIAGNOSIS:   Abnormal left mammogram.  POSTOPERATIVE DIAGNOSIS:  Abnormal left mammogram.  OPERATION:   Needle localization, left breast biopsy.  SURGEON:  Catalina Lunger, M.D.  ANESTHESIA:  General.  DESCRIPTION OF PROCEDURE:  After wire localization was performed, the patient was taken to the operating room and placed in a supine position.  After general anesthesia was induced via laryngeal mask, the left breast was prepped and draped in normal sterile fashion.  A curvilinear periareolar incision was made extending from the 7 oclock region to the 10 oclock region of the left breast.  I dissected down to approximately 2 to 3 cm, incising all tissue around the reinforced segment of the wire.  Adequate hemostasis was assured. Specimen mammography verified the presence of the mass within the lesion. Tissue were injected with 0.25 Marcaine, and the skin was closed with a subcuticular 4-0 Monocryl.  Dermabond dressing was applied.  The patient tolerated the procedure well and went to PACU in good condition. Attending:  Vikki Ports, M.D. DD:  04/25/01 TD:  04/26/01 Job: [id number]

## 2011-01-26 NOTE — Op Note (Signed)
[facility]  Patient:    Kayla [name], Kayla [name] Visit Number: [id number] MRN: [id number]          Service Type: GYN Location: 4W [id number] 01 Attending Physician:  Mickle Mallory Dictated by:   Gerrit Friends. Aldona Bar, M.D. Proc. Date: 08/27/01 Admit Date:  08/27/2001                             Operative Report  PREOPERATIVE DIAGNOSES: 1. Menorrhagia. 2. Dysmenorrhea. 3. Uterine fibroids. 4. Stress urinary incontinence.  POSTOPERATIVE DIAGNOSES: 1. Menorrhagia. 2. Dysmenorrhea. 3. Uterine fibroids. 4. Stress urinary incontinence. 5. Pathology pending.  PROCEDURE:  First part - Total abdominal hysterectomy.  SURGEON:  Gerrit Friends. Aldona Bar, M.D.  ASSISTANT:  Luvenia Redden, M.D.  ANESTHESIA:  General endotracheal.  HISTORY:  This 50 year old, gravida 5, para 2, aborta 2, is being taken to the operating room at this time for total abdominal hysterectomy because of menorrhagia, dysmenorrhea, and uterine fibroids.  She was referred to me by Dr. Eleonore Chiquito.  She also has complaints of stress urinary incontinence and has been evaluated by Dr. Barron Alvine, who will be performing a pubovaginal sling at the conclusion of the abdominal hysterectomy, and that will be dictated as the second part of the operative note.  DESCRIPTION OF PROCEDURE:  The patient was taken to the operating room and after satisfactory induction of general endotracheal anesthesia, she was prepped and draped having been placed in a supine position.  A Foley catheter was placed as part of the prep.  Once the patient was adequately draped, the procedure was begun.  A Pfannenstiel incision was made and with minimal difficulty dissected down sharply to the fascia which was also incised in a low transverse fashion. Hemostasis was created at each layer.  Subfascial space was created inferiorly and superiorly, muscles separated in the midline, peritoneum identified and entered  appropriately with care taken to avoid the bowel superiorly and the bladder inferiorly.  At this time, the abdomen and pelvis was inspected.  The uterus was approximately 10-12 weeks size, very irregular.  Both ovaries and tubes felt normal.  The appendix was palpated and noted to be normal.  Upper abdomen was unremarkable.  At this time, a self-retaining OConnor-OSullivan retractor was placed, and bowel was packed off without difficulty.  Hysterectomy at this time was begun in the usual fashion.  Long Kelly clamps were placed at the cornus of the uterus, and the uterus was elevated.  At this time, the round ligaments were identified, suture ligated with 0 Vicryl suture, and transected with the Bovie, and the anterior flap was created as well, pushing the vesicouterine peritoneum off of the lower uterine segment.  At time time, as mentioned, both ovaries appeared normal.  Both ovaries were left in situ.  The ovarian pedicles were isolated, clamped, and doubly suture secured with 0 Vicryl suture.  Skeletonization of the uterine vessels was then carried out, and the uterine artery pedicles were then clamped, cut, and suture secured with 0 Vicryl suture and, thereafter, an addition parametrial bite was taken in a similar fashion bilaterally.  An attempt was made to identify both ureters, but because of the increased tissue on the sidewall (fatty tissue), it was difficult to specifically palpate the ureters.  At this time, to aid in visualization, the fundus of the uterus was removed. The cervical stump was then elevated, and continued parametrial bites were taken bilaterally with suture ligature  of 0 Vicryl suture used.  Finally the vaginal angles were identified, clamped, cut, and suture secured.  This was carried out after the rectum was pushed off posteriorly.  At this time, using the stings scissors, the cervix was removed in its entirety, and the vaginal cuff was thereafter closed  using figure-of-eight 0 Vicryl sutures, and the cuff was closed completely.  Hemostasis was adequate.  The pelvis at this time was profusely irrigated and noted to be dry.  The hysterectomy portion of the procedure was then noted to be complete.  Again, irrigation was carried out and hemostasis was adequate.  At time time, all packs were removed, retractors were removed, all counts were noted to be correct.  No foreign bodies were noted to be remaining in the abdominal cavity, and closure of the abdomen was carried out in the usual fashion.  The abdominal peritoneum was closed with 0 Vicryl in a running fashion, the muscles secured with same.  Assured of good subfascial hemostasis, the fascia was then reapproximated with 0 Vicryl from the angle of the midline bilaterally.  Subcutaneous tissue was rendered hemostatic, and the remainder of the closure of the abdomen will be carried out by Dr. Isabel Caprice upon completion of the pubovaginal sling portion of the procedure which will be dictated in a separate operative note.  At this point, estimated blood loss was approximately 150-200 cc.  All counts correct x 2. Specimen included the uterus and included the cervix. Dictated by:   Gerrit Friends. Aldona Bar, M.D. Attending Physician:  Mickle Mallory DD:  08/27/01 TD:  08/27/01 Job: [id number]

## 2011-01-26 NOTE — Op Note (Signed)
[facility]  Patient:    Kayla [name], Kayla [name] Visit Number: [id number] MRN: [id number]          Service Type: GYN Location: 4W 0457 01 Attending Physician:  Mickle Mallory Dictated by:   Barron Alvine, M.D. Proc. Date: 08/27/01 Admit Date:  08/27/2001   CC:         Gerrit Friends. Aldona Bar, M.D.   Operative Report  PREOPERATIVE DIAGNOSIS:  Stress urinary incontinence.  POSTOPERATIVE DIAGNOSIS:  Stress urinary incontinence.  PROCEDURE:  Pubovaginal sling, flexible cystoscopy and suprapubic tube placement.  SURGEON:  Barron Alvine, M.D.  ANESTHESIA:  General endotracheal.  INDICATIONS FOR PROCEDURE:  Ms. Canul is a 50 year old female. She was sent to [place] through the courtesy of Dr. Aldona Bar for evaluation of concurrent stress incontinence. The patient has significant dysfunctional uterine bleeding and was scheduled for an abdominal hysterectomy. Because of her concurrent stress incontinence, we evaluated her and discussed with her the options for having a concurrent procedure for that problem. She has 80-90% stress base leakage with rare urge incontinence. She has otherwise fairly normal voiding patterns with mild frequency. She wears several thin panty liners per day. Urodynamics did not reveal any other worrisome features with regard to her bladder function. We told the patient that it would not be unusual for her incontinence to progress status post hysterectomy if she did not have a concurrent procedure. We talked about the possibility of a Burch suspension versus a pubovaginal sling. After much discussion, the patient did elect to have a concurrent pubovaginal sling with her hysterectomy. Dr. Aldona Bar performed the abdominal hysterectomy. He left the incision open after closure of the fascia and his portion of the operation will be dictated separately.  FINDINGS:  When we entered the operating room, the patient had had completion of a  hysterectomy. That procedure went well and the patient was stable. Again her fascia then closed but the subcutaneous skin portion of the incision were left open. The patient was in moderate lithotomy position. We had asked them to look at her fascia potentially for use of the graft but they found that to be quite attenuated and also thought that it might difficult to close her incision and therefore we elected to go ahead and use a piece of cadaveric fascia lata measuring approximately 3 x 7 cm. We utilized a weighted vaginal speculum and a Foley catheter. The patient really did not have a significant cystocele. For that reason, a small incision was made from the mid urethra back several centimeters and the vaginal clots were developed. With blunt dissective technique, we were able to get into the retropubic spaces bilaterally. The sling itself was anchored on both ends with a #1 nylon suture. Then utilizing the Pfannenstiel incision from the hysterectomy, we were able to pass a Stamey needle passer underneath the pubic symphysis to the tip of my finger. We were able to use digital finger control throughout that passage and this was brought out the right side of the vaginal incision. The nylon sutures were grabbed through the eye of the needle and brought out the suprapubic incision. The same thing was done on the left side. This thing was then positioned at the mid urethra and opened up to disperse the sling. Cystoscopy showed this to be in good position with no evidence of bladder injury or suture within the bladder. The suprapubic tube was placed with direct visual guidance. We then trimmed a very small amount of redundant anterior vaginal mucosa and  closed the vaginal incision with a running 2-0 Vicryl suture. Vaginal packing was utilized. The suprapubic incision was irrigated and then infiltrated with Marcaine. The subcutaneous tissues were reapproximated and the skin was closed clips.  The suprapubic tube was just inferior to that incision. It was left to grab the drainage and the Foley catheter was removed. The patient was brought to the recovery room in stable condition without obvious complications or difficulties. Dictated by:   Barron Alvine, M.D. Attending Physician:  Mickle Mallory DD:  08/27/01 TD:  08/28/01 Job: [id number]

## 2011-01-26 NOTE — Discharge Summary (Signed)
[facility]  Patient:    Kayla [name], Kayla [name] Visit Number: [id number] MRN: [id number]          Service Type: GYN Location: 4W 0457 01 Attending Physician:  Mickle Mallory Dictated by:   Gerrit Friends. Aldona Bar, M.D. Admit Date:  08/27/2001 Discharge Date: 08/29/2001   CC:         Gordy Savers, M.D.  Barron Alvine, M.D.   Discharge Summary  DISCHARGE DIAGNOSES: 1. Leiomyomata uterus. 2. Adenomyosis of the uterus. 3. Clinical dysmenorrhea and menorrhagia. 4. Stress urinary incontinence.  SUMMARY:  This 50 year old female was admitted to the hospital after a preoperative work-up for total abdominal hysterectomy by myself and a pubovaginal sling by Dr. Isabel Caprice.  The procedure was carried out without incident.  The patients postoperative course was entirely benign.  Her pathology was benign.  Her discharge hemoglobin was 11.4, white count 12,100, platelet count 336,000, and metabolic profile within normal limits.  On the morning of 12/20, she was ambulating well, tolerating a regular diet well, having normal bowel function, and was unable to void yet, but her suprapubic catheter was functioning well.  Her wound was clean and dry, and she was having no vaginal bleeding.  She was afebrile.  Vital signs were stable.  She was ready for discharge and, accordingly, she was discharged to home with instructions to return to my office on 12/23 for staple removal, Dr. Noralee Chars office on 12/26 for follow up of the suprapubic catheter, etc., and again to see me in approximately 3-4 weeks for a postoperative check up. She was given all specific instructions at the time of discharge and understood all instructions well.  DISCHARGE MEDICATIONS: 1. Cipro 250 mg twice daily until she sees Dr. Isabel Caprice. 2. Tylox 1-2 every 4-6 hours as needed for severe pain. 3. Motrin 600 mg every 6 hours as needed for less severe pain.  Her pathology report returned and  was consistent with the discharge diagnosis.  CONDITION ON DISCHARGE:  Improved. Dictated by:   Gerrit Friends. Aldona Bar, M.D. Attending Physician:  Mickle Mallory DD:  08/29/01 TD:  08/31/01 Job: [phone] [id number]

## 2011-01-30 ENCOUNTER — Encounter: Payer: Self-pay | Admitting: Internal Medicine

## 2011-01-30 ENCOUNTER — Ambulatory Visit (HOSPITAL_BASED_OUTPATIENT_CLINIC_OR_DEPARTMENT_OTHER): Payer: BC Managed Care – PPO | Attending: Internal Medicine

## 2011-01-30 ENCOUNTER — Ambulatory Visit (INDEPENDENT_AMBULATORY_CARE_PROVIDER_SITE_OTHER): Payer: BC Managed Care – PPO | Admitting: Internal Medicine

## 2011-01-30 DIAGNOSIS — E785 Hyperlipidemia, unspecified: Secondary | ICD-10-CM

## 2011-01-30 DIAGNOSIS — I10 Essential (primary) hypertension: Secondary | ICD-10-CM

## 2011-01-30 DIAGNOSIS — E739 Lactose intolerance, unspecified: Secondary | ICD-10-CM

## 2011-01-30 DIAGNOSIS — G4733 Obstructive sleep apnea (adult) (pediatric): Secondary | ICD-10-CM | POA: Insufficient documentation

## 2011-01-30 LAB — HEMOGLOBIN A1C: Hgb A1c MFr Bld: 7.6 % — ABNORMAL HIGH (ref 4.6–6.5)

## 2011-01-30 LAB — GLUCOSE, POCT (MANUAL RESULT ENTRY): POC Glucose: 147

## 2011-01-30 MED ORDER — ATORVASTATIN CALCIUM 20 MG PO TABS: ORAL_TABLET | ORAL | Status: DC

## 2011-01-30 NOTE — Progress Notes (Signed)
  Subjective:    Patient ID: Kayla [name], female    DOB: 1961-05-04, 50 y.o.   MRN: [id number]  HPI  50 year old patient who is in today for followup. She has a history of hypertension and dyslipidemia. She was seen 6 months ago for annual physical. LDL cholesterol was slightly over 100. She is on 10 mg of Lipitor daily. She is scheduled for a sleep study tonight to rule out obstructive sleep apnea. She has a history of depression which has been stable.  She is cared for a granddaughter with scarlet fever. She is asymptomatic. Unfortunately there is been no weight loss.   Review of Systems  Constitutional: Negative.   HENT: Negative for hearing loss, congestion, sore throat, rhinorrhea, dental problem, sinus pressure and tinnitus.   Eyes: Negative for pain, discharge and visual disturbance.  Respiratory: Negative for cough and shortness of breath.   Cardiovascular: Negative for chest pain, palpitations and leg swelling.  Gastrointestinal: Negative for nausea, vomiting, abdominal pain, diarrhea, constipation, blood in stool and abdominal distention.  Genitourinary: Negative for dysuria, urgency, frequency, hematuria, flank pain, vaginal bleeding, vaginal discharge, difficulty urinating, vaginal pain and pelvic pain.  Musculoskeletal: Negative for joint swelling, arthralgias and gait problem.  Skin: Negative for rash.  Neurological: Negative for dizziness, syncope, speech difficulty, weakness, numbness and headaches.  Hematological: Negative for adenopathy.  Psychiatric/Behavioral: Negative for behavioral problems, dysphoric mood and agitation. The patient is not nervous/anxious.        Objective:   Physical Exam  Constitutional: She is oriented to person, place, and time. She appears well-developed and well-nourished.  HENT:  Head: Normocephalic.  Right Ear: External ear normal.  Left Ear: External ear normal.  Mouth/Throat: Oropharynx is clear and moist.  Eyes: Conjunctivae and  EOM are normal. Pupils are equal, round, and reactive to light.  Neck: Normal range of motion. Neck supple. No thyromegaly present.  Cardiovascular: Normal rate, regular rhythm, normal heart sounds and intact distal pulses.   Pulmonary/Chest: Effort normal and breath sounds normal.  Abdominal: Soft. Bowel sounds are normal. She exhibits no mass. There is no tenderness.  Musculoskeletal: Normal range of motion.  Lymphadenopathy:    She has no cervical adenopathy.  Neurological: She is alert and oriented to person, place, and time.  Skin: Skin is warm and dry. No rash noted.  Psychiatric: She has a normal mood and affect. Her behavior is normal.          Assessment & Plan:   Hypertension well controlled Dyslipidemia. We'll increase Lipitor to 20 mg daily  Glucose tolerance. Exercise weight loss encouraged we'll check a random blood sugar

## 2011-01-30 NOTE — Patient Instructions (Signed)
You need to lose weight.  Consider a lower calorie diet and regular exercise.  Limit your sodium (Salt) intake    It is important that you exercise regularly, at least 20 minutes 3 to 4 times per week.  If you develop chest pain or shortness of breath seek  medical attention.  Return in 6 months for follow-up  

## 2011-02-04 DIAGNOSIS — R0989 Other specified symptoms and signs involving the circulatory and respiratory systems: Secondary | ICD-10-CM

## 2011-02-04 DIAGNOSIS — G4733 Obstructive sleep apnea (adult) (pediatric): Secondary | ICD-10-CM

## 2011-02-04 DIAGNOSIS — R0609 Other forms of dyspnea: Secondary | ICD-10-CM

## 2011-02-04 NOTE — Procedures (Signed)
NAME:  Kayla [name], Kayla [name]          ACCOUNT NO.:  [id number]  MEDICAL RECORD NO.:  [id number]          PATIENT TYPE:  OUT  LOCATION:  SLEEP CENTER                 FACILITY:  [facility]  PHYSICIAN:  Inayah Woodin D. Maple Hudson, MD, FCCP, FACPDATE OF BIRTH:  12-07-58  DATE OF STUDY:  01/30/2011                           NOCTURNAL POLYSOMNOGRAM  REFERRING PHYSICIAN:  Merial Moritz D Mohid Furuya  INDICATION FOR STUDY:  Insomnia with sleep apnea.  EPWORTH SLEEPINESS SCORE:  MEDICATIONS:  Home medications are charted and reviewed.  SLEEP ARCHITECTURE:  Split study protocol.  During the diagnostic phase, total sleep time 168.5 minutes with sleep efficiency 76.1%.  Stage I was 11.3%, stage II 65.6%, stage III 13.4%, REM 9.8% of total sleep time. Sleep latency 18 minutes, REM latency 144.5 minutes, awake after sleep onset 33.5 minutes, arousal index 28.5.  Bedtime medication:  None.  RESPIRATORY DATA:  Split study protocol.  Apnea-hypopnea index (AHI) 19.9 per hour.  A total of 56 events was scored including 17 obstructive apneas, 1 central apnea, 4 mixed apneas, 34 hypopneas.  Events were seen in all sleep positions, especially while supine.  REM AHI 50.9.  CPAP was then titrated to 9 CWP, AHI 0 per hour.  She wore a ResMed Mirage FX nasal mask with C-Flex setting of 3.  OXYGEN DATA:  Moderate snoring before CPAP with oxygen desaturation to a nadir of 88% on room air.  With CPAP titration mean oxygen saturation held 94.7% on room air and snoring was prevented.  CARDIAC DATA:  Sinus rhythm with occasional PAC.  MOVEMENT-PARASOMNIA:  No significant movement disturbance.  Bathroom x1.  IMPRESSIONS-RECOMMENDATIONS: 1. Moderate obstructive sleep apnea/hypopnea syndrome, apnea-hypopnea     index 19.9 per hour with events in all sleep positions.  Moderate     snoring and oxygen desaturation to a nadir of 88% on room air. 2. Successful continuous positive airway pressure titration to 9     centimeters of water  pressure, apnea-hypopnea     index 0 per hour.  She wore a ResMed Mirage FX nasal mask with the     C-Flex setting of 3.  Snoring was prevented and oxygenation     normalized.     Milaina Sher D. Maple Hudson, MD, [facility], FACP Diplomate, Biomedical engineer of Sleep Medicine Electronically Signed    CDY/MEDQ  D:  02/04/2011 12:45:41  T:  02/04/2011 23:44:22  Job:  [id number]

## 2011-02-12 ENCOUNTER — Telehealth: Payer: Self-pay | Admitting: *Deleted

## 2011-02-12 NOTE — Telephone Encounter (Signed)
Pt would like lab results.  

## 2011-02-12 NOTE — Telephone Encounter (Signed)
Please advise 

## 2011-02-13 ENCOUNTER — Encounter: Payer: Self-pay | Admitting: Internal Medicine

## 2011-02-13 MED ORDER — METFORMIN HCL 500 MG PO TABS: 500.0000 mg | ORAL_TABLET | Freq: Two times a day (BID) | ORAL | Status: DC

## 2011-02-13 NOTE — Telephone Encounter (Signed)
Called pt - informed of lab and dr. Vernon Prey instructions to start metformin and rov 1 mos. New rx sent to [facility]. Pt will call back to make rov. KIK

## 2011-02-13 NOTE — Telephone Encounter (Signed)
Notify patient that HghA1C is elevated into diabetic range- start metformin 500 one BID  #180; stress better diet exercise and weight loss; sent diabetic info; rov ONE MONTH

## 2011-02-15 ENCOUNTER — Encounter: Payer: Self-pay | Admitting: Internal Medicine

## 2011-02-15 ENCOUNTER — Ambulatory Visit (INDEPENDENT_AMBULATORY_CARE_PROVIDER_SITE_OTHER): Payer: BC Managed Care – PPO | Admitting: Internal Medicine

## 2011-02-15 VITALS — BP 108/62 | HR 83 | Ht 63.0 in | Wt 235.6 lb

## 2011-02-15 DIAGNOSIS — G4700 Insomnia, unspecified: Secondary | ICD-10-CM

## 2011-02-15 DIAGNOSIS — G4733 Obstructive sleep apnea (adult) (pediatric): Secondary | ICD-10-CM

## 2011-02-15 MED ORDER — ESZOPICLONE 2 MG PO TABS: 2.0000 mg | ORAL_TABLET | Freq: Every evening | ORAL | Status: DC | PRN

## 2011-02-15 NOTE — Progress Notes (Signed)
  Subjective:    Patient ID: Kayla [name], female    DOB: 05-20-1961, 50 y.o.   MRN: [id number]  HPI    Review of Systems     Objective:   Physical Exam        Assessment & Plan:   Subjective:    Patient ID: Kayla [name], female    DOB: 1961/01/15, 50 y.o.   MRN: [id number]  HPI 01/07/11- 27 yoF self referred for sleep medicine evaluation. She describes loud snoring, witnessed respiratory distress and daytme tiredness. Occasional ambien at bedtime. Occasional caffeine in day. No ENT surgery. Has considered sleep an issue for 10 years. See sleep questionnaire.  02/15/11- OSA / Insomnia Returns after sleep study  NPSG 01/30/11- Moderate OSA  AHI 19.9/ hr. CPAP 9 gave AHI 0/hr Zolpidem takes too long now to help her get to sleep and doesn't last through the night.   Review of Systems See HPI Constitutional:   No weight loss, night sweats,  Fevers, chills, fatigue, HEENT:   No headaches,  Difficulty swallowing,  Tooth/dental problems,  Sore throat,                No sneezing, itching, ear ache, nasal congestion, post nasal drip,   CV:  No chest pain,  Orthopnea, PND, swelling in lower extremities, anasarca, dizziness, palpitations  GI  No heartburn, indigestion, abdominal pain, nausea, vomiting, diarrhea, change in bowel habits, loss of appetite  Resp: No shortness of breath with exertion or at rest.  No excess mucus, no productive cough,  No non-productive cough,  No coughing up of blood.  No change in color of mucus.  No wheezing.   Skin: no rash or lesions.  GU: no dysuria, change in color of urine, no urgency or frequency.  No flank pain.  MS:  No joint pain or swelling.  No decreased range of motion.  No back pain.  Psych:  No change in mood or affect. No depression or anxiety.  No memory loss.      Objective:   Physical Exam General- Alert, Oriented, Affect-appropriate, Distress- none acute.      overweight  Skin- rash-none, lesions- none,  excoriation- none  Lymphadenopathy- none  Head- atraumatic  Eyes- Gross vision intact, PERRLA, conjunctivae clear secretions  Ears- OK- Hearing, canals, Tm - normal  Nose- Clear, No- Septal dev, mucus, polyps, erosion, perforation   Throat- Mallampati II-III , mucosa clear , drainage- none, tonsils- atrophic  Neck- flexible , trachea midline, no stridor , thyroid nl, carotid no bruit  Chest - symmetrical excursion , unlabored     Heart/CV- RRR , no murmur , no gallop  , no rub, nl s1 s2                     - JVD- none , edema- none, stasis changes- none, varices- none     Lung- clear to P&A, wheeze- none, cough- none , dullness-none, rub- none     Chest wall-  Abd- tender-no, distended-no, bowel sounds-present, HSM- no  Br/ Gen/ Rectal- Not done, not indicated  Extrem- cyanosis- none, clubbing, none, atrophy- none, strength- nl  Neuro- grossly intact to observation         Assessment & Plan:

## 2011-02-15 NOTE — Assessment & Plan Note (Signed)
Obstructive sleep apnea confirmed. We discussed medical concerns, sleep habits and available treatments. She will start with CPAP, assisted by sleep med.

## 2011-02-15 NOTE — Assessment & Plan Note (Signed)
She has failed reasonable trial of zolpidem. We will try [facility]

## 2011-02-15 NOTE — Patient Instructions (Addendum)
Order [facility] refer respiratory    New CPAP 9, mask of choice, heated humidifier  Dx OSA   Script for Lunesta 2 mg with samples

## 2011-02-26 ENCOUNTER — Encounter: Payer: Self-pay | Admitting: Internal Medicine

## 2011-03-02 ENCOUNTER — Other Ambulatory Visit: Payer: Self-pay | Admitting: Internal Medicine

## 2011-03-29 ENCOUNTER — Ambulatory Visit (INDEPENDENT_AMBULATORY_CARE_PROVIDER_SITE_OTHER): Payer: BC Managed Care – PPO | Admitting: Internal Medicine

## 2011-03-29 ENCOUNTER — Ambulatory Visit: Payer: BC Managed Care – PPO | Admitting: Internal Medicine

## 2011-03-29 ENCOUNTER — Encounter: Payer: Self-pay | Admitting: Internal Medicine

## 2011-03-29 VITALS — BP 106/68 | HR 78 | Ht 63.0 in | Wt 237.4 lb

## 2011-03-29 DIAGNOSIS — G4733 Obstructive sleep apnea (adult) (pediatric): Secondary | ICD-10-CM

## 2011-03-29 DIAGNOSIS — G4700 Insomnia, unspecified: Secondary | ICD-10-CM

## 2011-03-29 MED ORDER — CLONAZEPAM 1 MG PO TABS: ORAL_TABLET | ORAL | Status: DC

## 2011-03-29 NOTE — Patient Instructions (Signed)
Continue CPAP at 9 cwp. Our goal is "all night, every night"  Script clonazepam for sleep- printed

## 2011-03-29 NOTE — Assessment & Plan Note (Signed)
We have discussed sleep hygiene. This is still an important complaint for her. We will let her try clonazepam.

## 2011-03-29 NOTE — Assessment & Plan Note (Signed)
Good compliance and control 

## 2011-03-29 NOTE — Progress Notes (Signed)
Subjective:    Patient ID: Kayla [name], female    DOB: February 19, 1961, 50 y.o.   MRN: [id number]  HPI  Subjective:    Patient ID: Kayla [name], female    DOB: 1961-01-09, 50 y.o.   MRN: [id number]  HPI    Review of Systems     Objective:   Physical Exam        Assessment & Plan:   Subjective:    Patient ID: Kayla [name], female    DOB: [birth year], 50 y.o.   MRN: [id number]  HPI 01/07/11- 27 yoF self referred for sleep medicine evaluation. She describes loud snoring, witnessed respiratory distress and daytme tiredness. Occasional ambien at bedtime. Occasional caffeine in day. No ENT surgery. Has considered sleep an issue for 10 years. See sleep questionnaire.  02/15/11- OSA / Insomnia Returns after sleep study  NPSG 01/30/11- Moderate OSA  AHI 19.9/ hr. CPAP 9 gave AHI 0/hr Zolpidem takes too long now to help her get to sleep and doesn't last through the night.  03/29/11-  50 yoF never smoker followed for OSA / Insomnia, complicated by HBP, depression Doing well, using CPAP all night every night. It makes a difference. Download for compliance is pending. Mask and pressure seem fine.  Zolpidem didn't work well for her difficulty initiating and maintaining sleep. Lunesta didn't help at all. We discussed a trial of clonazepam.   Review of Systems See HPI Constitutional:   No weight loss, night sweats,  Fevers, chills, fatigue, HEENT:   No headaches,  Difficulty swallowing,  Tooth/dental problems,  Sore throat,                No sneezing, itching, ear ache, nasal congestion, post nasal drip,   CV:  No chest pain,  Orthopnea, PND, swelling in lower extremities, anasarca, dizziness, palpitations  GI  No heartburn, indigestion, abdominal pain, nausea, vomiting, diarrhea, change in bowel habits, loss of appetite  Resp: No shortness of breath with exertion or at rest.  No excess mucus, no productive cough,  No non-productive cough,  No coughing up of blood.  No  change in color of mucus.  No wheezing.   Skin: no rash or lesions.  GU: no dysuria, change in color of urine, no urgency or frequency.  No flank pain.  MS:  No joint pain or swelling.  No decreased range of motion.  No back pain.  Psych:  No change in mood or affect. No depression or anxiety.  No memory loss.      Objective:   Physical Exam General- Alert, Oriented, Affect-appropriate, Distress- none acute.      overweight  Skin- rash-none, lesions- none, excoriation- none  Lymphadenopathy- none  Head- atraumatic  Eyes- Gross vision intact, PERRLA, conjunctivae clear secretions  Ears- OK- Hearing, canals, Tm - normal  Nose- Clear, No- Septal dev, mucus, polyps, erosion, perforation   Throat- Mallampati II-III , mucosa clear , drainage- none, tonsils- atrophic  Neck- flexible , trachea midline, no stridor , thyroid nl, carotid no bruit  Chest - symmetrical excursion , unlabored     Heart/CV- RRR , no murmur , no gallop  , no rub, nl s1 s2                     - JVD- none , edema- none, stasis changes- none, varices- none     Lung- clear to P&A, wheeze- none, cough- none , dullness-none, rub- none     Chest wall-  Abd- tender-no, distended-no, bowel sounds-present, HSM- no  Br/ Gen/ Rectal- Not done, not indicated  Extrem- cyanosis- none, clubbing, none, atrophy- none, strength- nl  Neuro- grossly intact to observation         Assessment & Plan:     Review of Systems     Objective:   Physical Exam        Assessment & Plan:

## 2011-05-16 ENCOUNTER — Other Ambulatory Visit: Payer: Self-pay | Admitting: Obstetrics & Gynecology

## 2011-05-16 DIAGNOSIS — R928 Other abnormal and inconclusive findings on diagnostic imaging of breast: Secondary | ICD-10-CM

## 2011-05-21 ENCOUNTER — Ambulatory Visit
Admission: RE | Admit: 2011-05-21 | Discharge: 2011-05-21 | Disposition: A | Discharge status: Home / Self Care | Payer: BC Managed Care – PPO | Source: Ambulatory Visit | Attending: Obstetrics & Gynecology | Admitting: Obstetrics & Gynecology

## 2011-05-21 DIAGNOSIS — R928 Other abnormal and inconclusive findings on diagnostic imaging of breast: Secondary | ICD-10-CM

## 2011-06-08 LAB — BASIC METABOLIC PANEL
CO2: 29
Calcium: 9.2
Creatinine, Ser: 0.83
GFR calc Af Amer: >60
GFR calc non Af Amer: >60

## 2011-07-20 ENCOUNTER — Other Ambulatory Visit (INDEPENDENT_AMBULATORY_CARE_PROVIDER_SITE_OTHER): Payer: BC Managed Care – PPO

## 2011-07-20 DIAGNOSIS — Z0000 Encounter for general adult medical examination without abnormal findings: Secondary | ICD-10-CM

## 2011-07-20 LAB — CBC WITH DIFFERENTIAL/PLATELET
Basophils Absolute: 0 10*3/uL (ref 0.0–0.1)
Eosinophils Relative: 4.5 % (ref 0.0–5.0)
Hemoglobin: 14.3 g/dL (ref 12.0–15.0)
Lymphocytes Relative: 34.3 % (ref 12.0–46.0)
Monocytes Relative: 6.6 % (ref 3.0–12.0)
Neutro Abs: 5.1 10*3/uL (ref 1.4–7.7)
Platelets: 302 10*3/uL (ref 150.0–400.0)
RDW: 13.7 % (ref 11.5–14.6)
WBC: 9.4 10*3/uL (ref 4.5–10.5)

## 2011-07-20 LAB — HEPATIC FUNCTION PANEL
AST: 21 U/L (ref 0–37)
Alkaline Phosphatase: 69 U/L (ref 39–117)
Total Bilirubin: 0.7 mg/dL (ref 0.3–1.2)

## 2011-07-20 LAB — BASIC METABOLIC PANEL
Calcium: 9.7 mg/dL (ref 8.4–10.5)
GFR: 75.51 mL/min (ref >60.00)
Glucose, Bld: 124 mg/dL — ABNORMAL HIGH (ref 70–99)
Sodium: 143 mEq/L (ref 135–145)

## 2011-07-20 LAB — POCT URINALYSIS DIPSTICK
Leukocytes, UA: NEGATIVE
Nitrite, UA: NEGATIVE
Protein, UA: NEGATIVE
Urobilinogen, UA: 0.2
pH, UA: 6.5

## 2011-07-20 LAB — LIPID PANEL
HDL: 52 mg/dL (ref >39.00)
LDL Cholesterol: 85 mg/dL (ref 0–99)
Total CHOL/HDL Ratio: 3
VLDL: 21.4 mg/dL (ref 0.0–40.0)

## 2011-07-20 LAB — TSH: TSH: 3.44 u[IU]/mL (ref 0.35–5.50)

## 2011-07-27 ENCOUNTER — Other Ambulatory Visit: Payer: BC Managed Care – PPO

## 2011-07-30 ENCOUNTER — Ambulatory Visit: Payer: BC Managed Care – PPO | Admitting: Internal Medicine

## 2011-08-03 ENCOUNTER — Ambulatory Visit (INDEPENDENT_AMBULATORY_CARE_PROVIDER_SITE_OTHER): Payer: BC Managed Care – PPO | Admitting: Internal Medicine

## 2011-08-03 ENCOUNTER — Encounter: Payer: Self-pay | Admitting: Internal Medicine

## 2011-08-03 VITALS — BP 110/70 | HR 80 | Temp 98.2°F | Resp 16 | Ht 63.0 in | Wt 232.0 lb

## 2011-08-03 DIAGNOSIS — I10 Essential (primary) hypertension: Secondary | ICD-10-CM

## 2011-08-03 DIAGNOSIS — Z23 Encounter for immunization: Secondary | ICD-10-CM

## 2011-08-03 DIAGNOSIS — Z0000 Encounter for general adult medical examination without abnormal findings: Secondary | ICD-10-CM

## 2011-08-03 DIAGNOSIS — E119 Type 2 diabetes mellitus without complications: Secondary | ICD-10-CM

## 2011-08-03 DIAGNOSIS — E785 Hyperlipidemia, unspecified: Secondary | ICD-10-CM

## 2011-08-03 MED ORDER — METFORMIN HCL ER 500 MG PO TB24: 500.0000 mg | ORAL_TABLET | Freq: Every day | ORAL | Status: DC

## 2011-08-03 MED ORDER — SERTRALINE HCL 100 MG PO TABS: 100.0000 mg | ORAL_TABLET | Freq: Every day | ORAL | Status: DC

## 2011-08-03 MED ORDER — LISINOPRIL-HYDROCHLOROTHIAZIDE 20-25 MG PO TABS: 1.0000 | ORAL_TABLET | Freq: Every day | ORAL | Status: DC

## 2011-08-03 MED ORDER — OXYBUTYNIN CHLORIDE ER 15 MG PO TB24: 15.0000 mg | ORAL_TABLET | Freq: Every day | ORAL | Status: DC

## 2011-08-03 MED ORDER — CLONAZEPAM 1 MG PO TABS: ORAL_TABLET | ORAL | Status: DC

## 2011-08-03 MED ORDER — LORAZEPAM 0.5 MG PO TABS: 0.5000 mg | ORAL_TABLET | Freq: Three times a day (TID) | ORAL | Status: DC

## 2011-08-03 MED ORDER — ATORVASTATIN CALCIUM 20 MG PO TABS: ORAL_TABLET | ORAL | Status: DC

## 2011-08-03 MED ORDER — MELOXICAM 15 MG PO TABS: 15.0000 mg | ORAL_TABLET | Freq: Every day | ORAL | Status: DC

## 2011-08-03 MED ORDER — TRAMADOL HCL 50 MG PO TABS: 50.0000 mg | ORAL_TABLET | ORAL | Status: DC | PRN

## 2011-08-03 NOTE — Progress Notes (Signed)
Subjective:    Patient ID: Kayla [name], female    DOB: May 22, 1961, 50 y.o.   MRN: [id number]  HPI  50 year old patient who is seen today for a wellness exam. She has type 2 diabetes and treated hypertension. She also has arthritis especially involving both knees the right greater than the left. She has dyslipidemia.  Allergies (verified):  No Known Drug Allergies   Past History:  Past Medical History:  Reviewed history from 12/13/2008 and no changes required.  Depression  Hyperlipidemia  Hypertension  Osteoarthritis  impaired glucose tolerance   Past Surgical History:  gravida two, para two, abortus zero,  left rotator cuff surgery  status post right knee surgery with meniscectomy and microfractures of the patella  Hysterectomy   Family History:  Reviewed history from 05/12/2009 and no changes required.  father died age 86, possible MI versus rheumatic fever  mother history of end-stage COPD: DM 2  maternal GM- DM 2  One brother died age 34, MI  one sister is well- IGT   Social History:  Reviewed history from 05/12/2009 and no changes required.  Married  Regular exercise-yes    Past Medical History  Diagnosis Date  . Depressive disorder, not elsewhere classified   . Other and unspecified hyperlipidemia   . Unspecified essential hypertension   . Osteoarthritis   . Mitral valve prolapse     History   Social History  . Marital Status: Married    Spouse Name: N/A    Number of Children: 2  . Years of Education: N/A   Occupational History  . Chiropodist    Social History Main Topics  . Smoking status: Never Smoker   . Smokeless tobacco: Never Used  . Alcohol Use: No  . Drug Use: No  . Sexually Active: Not on file   Other Topics Concern  . Not on file   Social History Narrative  . No narrative on file    Past Surgical History  Procedure Date  . Rotator cuff repair   . Vesicovaginal fistula closure w/ tah   . Replacement total knee       Family History  Problem Relation Age of Onset  . Heart disease Father   . COPD Mother   . Heart disease Mother   . Heart attack Brother     No Known Allergies  Current Outpatient Prescriptions on File Prior to Visit  Medication Sig Dispense Refill  . atorvastatin (LIPITOR) 20 MG tablet 1 daily  90 tablet  6  . clonazePAM (KLONOPIN) 1 MG tablet 1/2 to 1 tab for sleep as needed  30 tablet  5  . lisinopril-hydrochlorothiazide (PRINZIDE,ZESTORETIC) 20-25 MG per tablet Take 1 tablet by mouth daily.        Marland Kitchen LORazepam (ATIVAN) 0.5 MG tablet TAKE ONE TABLET BY MOUTH TWICE DAILY AS NEEDED  60 tablet  2  . meloxicam (MOBIC) 15 MG tablet Take 15 mg by mouth daily.        . metFORMIN (GLUCOPHAGE) 500 MG tablet Take 1 tablet (500 mg total) by mouth 2 (two) times daily with a meal.  180 tablet  1  . oxybutynin (DITROPAN XL) 15 MG 24 hr tablet Take 15 mg by mouth daily.        . sertraline (ZOLOFT) 100 MG tablet Take 100 mg by mouth daily.        . traMADol (ULTRAM) 50 MG tablet Take 50 mg by mouth as needed.  BP 110/70  Pulse 80  Temp(Src) 98.2 F (36.8 C) (Oral)  Resp 16  Ht 5\' 3"  (1.6 m)  Wt 232 lb (105.235 kg)  BMI 41.10 kg/m2  SpO2 97%    Review of Systems  Constitutional: Negative for fever, appetite change, fatigue and unexpected weight change.  HENT: Negative for hearing loss, ear pain, nosebleeds, congestion, sore throat, mouth sores, trouble swallowing, neck stiffness, dental problem, voice change, sinus pressure and tinnitus.   Eyes: Negative for photophobia, pain, redness and visual disturbance.  Respiratory: Negative for cough, chest tightness and shortness of breath.   Cardiovascular: Negative for chest pain, palpitations and leg swelling.  Gastrointestinal: Negative for nausea, vomiting, abdominal pain, diarrhea, constipation, blood in stool, abdominal distention and rectal pain.  Genitourinary: Negative for dysuria, urgency, frequency, hematuria, flank pain,  vaginal bleeding, vaginal discharge, difficulty urinating, genital sores, vaginal pain, menstrual problem and pelvic pain.  Musculoskeletal: Negative for back pain and arthralgias.  Skin: Negative for rash.  Neurological: Negative for dizziness, syncope, speech difficulty, weakness, light-headedness, numbness and headaches.  Hematological: Negative for adenopathy. Does not bruise/bleed easily.  Psychiatric/Behavioral: Negative for suicidal ideas, behavioral problems, self-injury, dysphoric mood and agitation. The patient is not nervous/anxious.        Objective:   Physical Exam  Constitutional: She is oriented to person, place, and time. She appears well-developed and well-nourished.  HENT:  Head: Normocephalic and atraumatic.  Right Ear: External ear normal.  Left Ear: External ear normal.  Mouth/Throat: Oropharynx is clear and moist.  Eyes: Conjunctivae and EOM are normal.  Neck: Normal range of motion. Neck supple. No JVD present. No thyromegaly present.  Cardiovascular: Normal rate, regular rhythm, normal heart sounds and intact distal pulses.   No murmur heard. Pulmonary/Chest: Effort normal and breath sounds normal. She has no wheezes. She has no rales.  Abdominal: Soft. Bowel sounds are normal. She exhibits no distension and no mass. There is no tenderness. There is no rebound and no guarding.  Musculoskeletal: Normal range of motion. She exhibits no edema and no tenderness.  Neurological: She is alert and oriented to person, place, and time. She has normal reflexes. No cranial nerve deficit. She exhibits normal muscle tone. Coordination normal.  Skin: Skin is warm and dry. No rash noted.  Psychiatric: She has a normal mood and affect. Her behavior is normal.          Assessment & Plan:     Preventive Health Exam DM2 HTN HLD  Lifestyle discussed

## 2011-08-03 NOTE — Patient Instructions (Signed)
Limit your sodium (Salt) intake    It is important that you exercise regularly, at least 20 minutes 3 to 4 times per week.  If you develop chest pain or shortness of breath seek  medical attention.  You need to lose weight.  Consider a lower calorie diet and regular exercise.  Schedule your colonoscopy to help detect colon cancer.   

## 2011-08-15 NOTE — H&P (Signed)
[name] is a 50 year old who comes in with left knee pain for 2-3 years. She had a cortisone injection on 03/23/11 in both knees which helped 80% but is now wearing off. She states her left knee pain is in the anteromedial aspect of the knee 4/10 in intensity worse with weightbearing improved with rest and cortisone injection. She's taking Tramadol and Hydrocodone for pain. She has intermittent catching in the left knee. No locking or giving way. She has a diagnosis of degenerative joint disease in the right knee medial compartment bone on bone. She's been treating her degenerative joint disease conservatively with medications and cortisone injections. She is also complaining of right wrist CMC pain, she has diagnosis of right wrist CMC degenerative joint disease and has had cortisone injection in the past which helped tremendously with her pain. Her right wrist pain is 4/10 sharp pain worse with activity and use of her thumb. No numbness or tingling distally. She denies any wrist or hand injury. Past medical history: significant for type II diabetes, mitral valve prolapse and heart murmur. Current medications: Metformin, Lisinopril, Lipitor, Myoflex, Tramadol, Norco. No known drug allergies. 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   EXAMINATION: Well developed well nourished in no acute distress. Eyes: extraocular motion is intact. Lymph: no cervical or axillary lymph nodes palpable. Cardiovascular: no pedal edema. Respiratory: no increase in respiratory effort.  Psych: judgment and insight intact.  Right knee skin is intact, full range of motion tenderness to palpation over the medial joint line. Ligaments are intact. Sensation is intact distally. She has mild patellofemoral crepitus. No swelling. Left knee skin is intact, mild patellofemoral crepitus, she has full range of motion. She has tenderness to palpation in the medial joint line. Positive McMurray's for medial meniscus  tear. Sensation is intact distally. Ligaments are intact. She ambulates with a limp favoring the left side.  Right wrist skin is intact, full range of motion. There is tenderness to palpation in the [facility] joint, there is pain with mobilization of the right thumb. Sensation is intact distally.  X-RAYS: AP lateral sunrise and tunnel views of right knee shows severe degenerative joint disease medial compartment bone on bone. Left knee has mild degenerative joint disease in the medial compartment and patellofemoral compartment. No fracture or dislocation. X-rays of the right wrist AP and lateral show CMC osteoarthritis, no fracture or dislocation.  IMPRESSION: Left knee possible medial meniscus tear. Right knee severe degenerative joint disease medial compartment. Right wrist CMC degenerative joint disease.  PLAN: We'll proceed with a right knee cortisone injection. We'll proceed with a right wrist CMC joint injection. We'll obtain a left knee MRI to rule out meniscal tear. She'll follow-up after the MRI to go over the results. She's given a prescription for Ultram #30 and Norco 5/325 #60.   PROCEDURE: The patient's clinical condition is marked by substantial pain and/or significant functional disability. Other conservative therapy has not provided relief, is contraindicated, or not appropriate. There is a reasonable likelihood that injection will significantly improve the patient's pain and/or functional disability.   After routine sterile prep of the knee with use of ethyl chloride and 1% plain Xylocaine through a medial parapatellar approach the knee is entered without difficulty. The knee is injected with 2:6 Depo-Medrol/Marcaine, accomplished atraumatically.  Patient tolerated the procedure well.  After routine sterile prep the use of ethyl chloride and 1% plain Xylocaine for anesthesia the right wrist CMC joint is injected with 0.5:0.5  Depo-Medrol/Marcaine. The patient tolerated the procedure  well.  Kayla [name], M.D.  Electronically verified by Kayla [name], M.D. DFM(JR):kh D 07-10-11 T 07-11-11  Kayla [name]/[facility] [address]   [address] [place], [place] [address] [phone] A Division of [facility]  Kayla [name], M.D.     Kayla [name], M.D.     Kayla [name], M.D. Kayla [name], M.D.    Kayla [name], M.D. Kayla [name], M.D. Kayla [name], D.O.          Kayla [name]. Kayla Dienes, PA-C            Kayla A. Shepperson, PA-C Kayla [name], OPA-C   RE: Kayla [name], Kayla [name]   [id number]      DOB: 1961-02-21 PROGRESS NOTE: 07-20-11 Kayla [name] is seen for evaluation of her left knee. Cortisone injection in the right knee at the end of October has helped, no complete relief but at least tolerable. We also injected her [facility] joint right hand which has helped.   Left knee continues to be symptomatic. Although she has aching that's constant she definitely has very specific medial mechanical symptoms. She cannot pivot or squat without sharp pain mechanical symptoms. Stair climbing is also difficult.   Recent MRI of her left knee shows medial meniscus tear. Reactive effusion. There is some thinning medial compartment and spur formation medially but previous standing x-rays show maintenance of fairly good joint space throughout. History is reviewed updated included in the chart.  EXAMINATION: She's point tender medial joint line left knee. She has full motion good alignment stable ligaments. Positive medial McMurray's. Not a lot of grating or crepitus.   DISPOSITION: Sufficient continued symptoms on the left to warrant treatment. We discussed exam under anesthesia arthroscopy care of her meniscus tear. Outcome will depend on degree of degenerative changes that are found. More than 25 minutes spent covering this face-to-face and with counseling. Paperwork complete. All questions answered.   Kayla [name],  M.D.  Electronically verified by Kayla [name], M.D. DFM:kh D 07-20-11 T 07-23-11

## 2011-08-16 ENCOUNTER — Encounter (HOSPITAL_BASED_OUTPATIENT_CLINIC_OR_DEPARTMENT_OTHER)
Admission: RE | Disposition: A | Discharge status: Home / Self Care | Payer: Self-pay | Source: Ambulatory Visit | Attending: Orthopedic Surgery

## 2011-08-16 ENCOUNTER — Encounter (HOSPITAL_BASED_OUTPATIENT_CLINIC_OR_DEPARTMENT_OTHER): Payer: Self-pay | Admitting: Anesthesiology

## 2011-08-16 ENCOUNTER — Ambulatory Visit (HOSPITAL_BASED_OUTPATIENT_CLINIC_OR_DEPARTMENT_OTHER)
Admission: RE | Admit: 2011-08-16 | Discharge: 2011-08-16 | Disposition: A | Discharge status: Home / Self Care | Payer: BC Managed Care – PPO | Source: Ambulatory Visit | Attending: Orthopedic Surgery | Admitting: Orthopedic Surgery

## 2011-08-16 ENCOUNTER — Ambulatory Visit (HOSPITAL_BASED_OUTPATIENT_CLINIC_OR_DEPARTMENT_OTHER): Payer: BC Managed Care – PPO | Admitting: Anesthesiology

## 2011-08-16 ENCOUNTER — Other Ambulatory Visit: Payer: Self-pay

## 2011-08-16 ENCOUNTER — Encounter (HOSPITAL_BASED_OUTPATIENT_CLINIC_OR_DEPARTMENT_OTHER): Payer: Self-pay | Admitting: Orthopedic Surgery

## 2011-08-16 DIAGNOSIS — Z4789 Encounter for other orthopedic aftercare: Secondary | ICD-10-CM

## 2011-08-16 DIAGNOSIS — E119 Type 2 diabetes mellitus without complications: Secondary | ICD-10-CM | POA: Insufficient documentation

## 2011-08-16 DIAGNOSIS — M23305 Other meniscus derangements, unspecified medial meniscus, unspecified knee: Secondary | ICD-10-CM | POA: Insufficient documentation

## 2011-08-16 DIAGNOSIS — I10 Essential (primary) hypertension: Secondary | ICD-10-CM | POA: Insufficient documentation

## 2011-08-16 DIAGNOSIS — G4733 Obstructive sleep apnea (adult) (pediatric): Secondary | ICD-10-CM | POA: Insufficient documentation

## 2011-08-16 DIAGNOSIS — M9420 Chondromalacia, unspecified site: Secondary | ICD-10-CM | POA: Insufficient documentation

## 2011-08-16 HISTORY — PX: KNEE ARTHROSCOPY W/ MENISCECTOMY: SHX1879

## 2011-08-16 LAB — POCT I-STAT, CHEM 8
BUN: 14 mg/dL (ref 6–23)
Calcium, Ion: 1.16 mmol/L (ref 1.12–1.32)
Chloride: 102 mEq/L (ref 96–112)
Glucose, Bld: 131 mg/dL — ABNORMAL HIGH (ref 70–99)
Potassium: 3.6 mEq/L (ref 3.5–5.1)

## 2011-08-16 SURGERY — ARTHROSCOPY, KNEE, WITH MEDIAL MENISCECTOMY
Anesthesia: General | Site: Knee | Laterality: Left | Wound class: Clean

## 2011-08-16 MED ORDER — PROPOFOL 10 MG/ML IV EMUL
INTRAVENOUS | Status: DC | PRN
  Administered 2011-08-16: 200 mg via INTRAVENOUS

## 2011-08-16 MED ORDER — ONDANSETRON HCL 4 MG/2ML IJ SOLN
INTRAMUSCULAR | Status: DC | PRN
  Administered 2011-08-16: 4 mg via INTRAVENOUS

## 2011-08-16 MED ORDER — PROMETHAZINE HCL 25 MG/ML IJ SOLN: 6.2500 mg | INTRAMUSCULAR | Status: DC | PRN

## 2011-08-16 MED ORDER — BUPIVACAINE HCL (PF) 0.5 % IJ SOLN
INTRAMUSCULAR | Status: DC | PRN
  Administered 2011-08-16: 20 mL

## 2011-08-16 MED ORDER — METHYLPREDNISOLONE ACETATE 80 MG/ML IJ SUSP
INTRAMUSCULAR | Status: DC | PRN
  Administered 2011-08-16: 80 mg

## 2011-08-16 MED ORDER — DEXAMETHASONE SODIUM PHOSPHATE 4 MG/ML IJ SOLN
INTRAMUSCULAR | Status: DC | PRN
  Administered 2011-08-16: 10 mg via INTRAVENOUS

## 2011-08-16 MED ORDER — FENTANYL CITRATE 0.05 MG/ML IJ SOLN
INTRAMUSCULAR | Status: DC | PRN
  Administered 2011-08-16: 100 ug via INTRAVENOUS
  Administered 2011-08-16: 50 ug via INTRAVENOUS
  Administered 2011-08-16: 25 ug via INTRAVENOUS
  Administered 2011-08-16 (×2): 50 ug via INTRAVENOUS
  Administered 2011-08-16: 25 ug via INTRAVENOUS

## 2011-08-16 MED ORDER — CHLORHEXIDINE GLUCONATE 4 % EX LIQD: 60.0000 mL | Freq: Once | CUTANEOUS | Status: DC

## 2011-08-16 MED ORDER — HYDROMORPHONE HCL PF 1 MG/ML IJ SOLN
0.2500 mg | INTRAMUSCULAR | Status: DC | PRN
  Administered 2011-08-16 (×2): 0.5 mg via INTRAVENOUS

## 2011-08-16 MED ORDER — MIDAZOLAM HCL 5 MG/5ML IJ SOLN
INTRAMUSCULAR | Status: DC | PRN
  Administered 2011-08-16: 2 mg via INTRAVENOUS

## 2011-08-16 MED ORDER — MEPERIDINE HCL 25 MG/ML IJ SOLN: 6.2500 mg | INTRAMUSCULAR | Status: DC | PRN

## 2011-08-16 MED ORDER — DROPERIDOL 2.5 MG/ML IJ SOLN
INTRAMUSCULAR | Status: DC | PRN
  Administered 2011-08-16: 0.625 mg via INTRAVENOUS

## 2011-08-16 MED ORDER — CEFAZOLIN SODIUM 1-5 GM-% IV SOLN
1.0000 g | INTRAVENOUS | Status: AC
  Administered 2011-08-16: 2 g via INTRAVENOUS

## 2011-08-16 MED ORDER — OXYCODONE-ACETAMINOPHEN 5-325 MG PO TABS
1.0000 | ORAL_TABLET | Freq: Once | ORAL | Status: AC | PRN
  Administered 2011-08-16: 1 via ORAL

## 2011-08-16 MED ORDER — SODIUM CHLORIDE 0.9 % IR SOLN
Status: DC | PRN
  Administered 2011-08-16: 3000 mL

## 2011-08-16 MED ORDER — LACTATED RINGERS IV SOLN
INTRAVENOUS | Status: DC
  Administered 2011-08-16 (×2): via INTRAVENOUS

## 2011-08-16 SURGICAL SUPPLY — 37 items
BANDAGE ELASTIC 6 VELCRO ST LF (GAUZE/BANDAGES/DRESSINGS) ×2 IMPLANT
BLADE CUDA 5.5 (BLADE) IMPLANT
BLADE CUDA GRT WHITE 3.5 (BLADE) IMPLANT
BLADE CUTTER GATOR 3.5 (BLADE) ×2 IMPLANT
BLADE CUTTER MENIS 5.5 (BLADE) IMPLANT
BLADE GREAT WHITE 4.2 (BLADE) ×2 IMPLANT
BUR OVAL 4.0 (BURR) IMPLANT
CANISTER OMNI JUG 16 LITER (MISCELLANEOUS) ×2 IMPLANT
CANISTER SUCTION 2500CC (MISCELLANEOUS) IMPLANT
CLOTH BEACON ORANGE TIMEOUT ST (SAFETY) ×2 IMPLANT
CUTTER MENISCUS  4.2MM (BLADE)
CUTTER MENISCUS 4.2MM (BLADE) IMPLANT
DRAPE ARTHROSCOPY W/POUCH 90 (DRAPES) ×2 IMPLANT
DRSG PAD ABDOMINAL 8X10 ST (GAUZE/BANDAGES/DRESSINGS) ×2 IMPLANT
DURAPREP 26ML APPLICATOR (WOUND CARE) ×2 IMPLANT
ELECT MENISCUS 165MM 90D (ELECTRODE) IMPLANT
ELECT REM PT RETURN 9FT ADLT (ELECTROSURGICAL)
ELECTRODE REM PT RTRN 9FT ADLT (ELECTROSURGICAL) IMPLANT
GAUZE XEROFORM 1X8 LF (GAUZE/BANDAGES/DRESSINGS) ×2 IMPLANT
GLOVE BIOGEL M STRL SZ7.5 (GLOVE) ×2 IMPLANT
GLOVE BIOGEL PI IND STRL 8 (GLOVE) ×1 IMPLANT
GLOVE BIOGEL PI INDICATOR 8 (GLOVE) ×1
GLOVE ORTHO TXT STRL SZ7.5 (GLOVE) ×4 IMPLANT
GOWN BRE IMP PREV XXLGXLNG (GOWN DISPOSABLE) ×4 IMPLANT
GOWN PREVENTION PLUS XLARGE (GOWN DISPOSABLE) ×2 IMPLANT
HOLDER KNEE FOAM BLUE (MISCELLANEOUS) ×2 IMPLANT
KNEE WRAP E Z 3 GEL PACK (MISCELLANEOUS) ×2 IMPLANT
PACK ARTHROSCOPY DSU (CUSTOM PROCEDURE TRAY) ×2 IMPLANT
PACK BASIN DAY SURGERY FS (CUSTOM PROCEDURE TRAY) ×2 IMPLANT
PENCIL BUTTON HOLSTER BLD 10FT (ELECTRODE) IMPLANT
SET ARTHROSCOPY TUBING (MISCELLANEOUS) ×1
SET ARTHROSCOPY TUBING LN (MISCELLANEOUS) ×1 IMPLANT
SPONGE GAUZE 4X4 12PLY (GAUZE/BANDAGES/DRESSINGS) ×2 IMPLANT
SUT ETHILON 3 0 PS 1 (SUTURE) ×2 IMPLANT
SUT VIC AB 3-0 FS2 27 (SUTURE) IMPLANT
TOWEL OR 17X24 6PK STRL BLUE (TOWEL DISPOSABLE) ×2 IMPLANT
WATER STERILE IRR 1000ML POUR (IV SOLUTION) ×2 IMPLANT

## 2011-08-16 NOTE — Anesthesia Preprocedure Evaluation (Signed)
Anesthesia Evaluation  Patient identified by MRN, date of birth, ID band Patient awake    Reviewed: Allergy & Precautions, H&P , NPO status , Patient's Chart, lab work & pertinent test results  Airway Mallampati: II TM Distance: >3 FB Neck ROM: full    Dental No notable dental hx. (+) Teeth Intact   Pulmonary sleep apnea and Continuous Positive Airway Pressure Ventilation ,  clear to auscultation  Pulmonary exam normal       Cardiovascular hypertension, On Medications regular Normal    Neuro/Psych Negative Neurological ROS  Negative Psych ROS   GI/Hepatic negative GI ROS, Neg liver ROS,   Endo/Other  Well Controlled, Type obesity  Renal/GU negative Renal ROS  Genitourinary negative   Musculoskeletal   Abdominal Normal abdominal exam  (+)   Peds  Hematology negative hematology ROS (+)   Anesthesia Other Findings   Reproductive/Obstetrics negative OB ROS                           Anesthesia Physical Anesthesia Plan  ASA: III  Anesthesia Plan: General   Post-op Pain Management:    Induction: Intravenous  Airway Management Planned: LMA  Additional Equipment:   Intra-op Plan:   Post-operative Plan: Extubation in OR  Informed Consent: I have reviewed the patients History and Physical, chart, labs and discussed the procedure including the risks, benefits and alternatives for the proposed anesthesia with the patient or authorized representative who has indicated his/her understanding and acceptance.     Plan Discussed with: CRNA  Anesthesia Plan Comments:         Anesthesia Quick Evaluation

## 2011-08-16 NOTE — Anesthesia Procedure Notes (Signed)
Procedure Name: LMA Insertion Date/Time: 08/16/2011 4:05 PM Performed by: Radford Pax Pre-anesthesia Checklist: Patient identified, Timeout performed, Emergency Drugs available, Suction available and Patient being monitored Patient Re-evaluated:Patient Re-evaluated prior to inductionOxygen Delivery Method: Circle System Utilized Preoxygenation: Pre-oxygenation with 100% oxygen Intubation Type: IV induction Ventilation: Mask ventilation without difficulty LMA: LMA with gastric port inserted LMA Size: 4.0 Number of attempts: 1 Placement Confirmation: breath sounds checked- equal and bilateral and positive ETCO2 Tube secured with: Tape Dental Injury: Teeth and Oropharynx as per pre-operative assessment

## 2011-08-16 NOTE — Brief Op Note (Signed)
08/16/2011  5:05 PM  PATIENT:  Kayla [name]  50 y.o. female  PRE-OPERATIVE DIAGNOSIS:  mmt left  POST-OPERATIVE DIAGNOSIS:  Left Knee Medial Meniscus Tear  PROCEDURE:  Procedure(s): Left KNEE ARTHROSCOPY WITH MEDIAL MENISECTOMY  SURGEON:  Surgeon(s): Loreta Ave, MD  PHYSICIAN ASSISTANT: Zonia Kief M   ANESTHESIA:   general  EBL:  Total I/O In: 1500 [I.V.:1500] Out: -         SPECIMEN:  No Specimen  DISPOSITION OF SPECIMEN:  N/A  COUNTS:  YES  TOURNIQUET:  * No tourniquets in log *      PATIENT DISPOSITION:  PACU - hemodynamically stable.

## 2011-08-16 NOTE — Interval H&P Note (Signed)
History and Physical Interval Note:  08/16/2011 7:47 AM  Kayla [name]  has presented today for surgery, with the diagnosis of mmt left  The various methods of treatment have been discussed with the patient and family. After consideration of risks, benefits and other options for treatment, the patient has consented to  Procedure(s): KNEE ARTHROSCOPY WITH MEDIAL MENISECTOMY as a surgical intervention .  The patients' history has been reviewed, patient examined, no change in status, stable for surgery.  I have reviewed the patients' chart and labs.  Questions were answered to the patient's satisfaction.     Alexa Blish F

## 2011-08-16 NOTE — Anesthesia Postprocedure Evaluation (Signed)
  Anesthesia Post-op Note  Patient: Kayla [name]  Procedure(s) Performed:  KNEE ARTHROSCOPY WITH MEDIAL MENISECTOMY  Patient Location: PACU  Anesthesia Type: General  Level of Consciousness: awake, alert  and oriented  Airway and Oxygen Therapy: Patient Spontanous Breathing  Post-op Pain: mild  Post-op Assessment: Post-op Vital signs reviewed, Patient's Cardiovascular Status Stable, Respiratory Function Stable, Patent Airway and No signs of Nausea or vomiting  Post-op Vital Signs: Reviewed and stable  Complications: No apparent anesthesia complications

## 2011-08-16 NOTE — Transfer of Care (Signed)
Immediate Anesthesia Transfer of Care Note  Patient: Kayla [name]  Procedure(s) Performed:  KNEE ARTHROSCOPY WITH MEDIAL MENISECTOMY  Patient Location: PACU  Anesthesia Type: General  Level of Consciousness: awake and alert   Airway & Oxygen Therapy: Patient Spontanous Breathing and Patient connected to face mask oxygen  Post-op Assessment: Report given to PACU RN and Post -op Vital signs reviewed and stable  Post vital signs: Reviewed and stable  Complications: No apparent anesthesia complications

## 2011-08-21 NOTE — Op Note (Signed)
NAME:  Kayla [name], Kayla [name] NO.:  [id number]  MEDICAL RECORD NO.:  [id number]  LOCATION:                                 FACILITY:  PHYSICIAN:  Loreta Ave, M.D.      DATE OF BIRTH:  DATE OF PROCEDURE:  08/16/2011 DATE OF DISCHARGE:                              OPERATIVE REPORT   PREOPERATIVE DIAGNOSIS:  Left knee medial meniscus tear.  POSTOPERATIVE DIAGNOSIS:  Left knee medial meniscus tear, some focal mild grade 3 chondromalacia of the medial femoral condyle.  PROCEDURE:  Left knee exam under anesthesia.  Partial medial meniscectomy.  Chondroplasty, medial femoral condyle.  SURGEON:  Loreta Ave, M.D.  ASSISTANT:  Genene Churn. Barry Dienes, [place].  ANESTHESIA:  General.  BLOOD LOSS:  Minimal.  SPECIMENS:  None.  COMPLICATION:  None.  DRESSINGS:  Soft compressive.  TOURNIQUET:  Not employed.  PROCEDURE:  The patient was brought to the operating room, placed on the operating table in supine position.  After adequate anesthesia had been obtained, knee examined.  Good motion, good stability.  Leg holder applied.  Leg prepped and draped in usual sterile fashion.  Two portals, one each medial and lateral parapatellar.  Arthroscope was introduced. Knee distended and inspected.  Patellofemoral joint with good tracking and cartilage throughout.  Cruciate ligaments intact.  Lateral meniscus and lateral compartment are normal.  Medial meniscus, extensive complex tearing entire posterior half, displaced fragments.  Saucerized out to a stable rim.  Tapered into remaining meniscus.  Some mild focal grade 2 and 3 changes on the condyle, which was rubbing over the torn cartilage, debrided to a stable surface.  Most of the compartment looked good. Entire knee examined, no other findings appreciated.  Instruments and fluid removed.  Portals closed with nylon. Injected Marcaine.  Knee injected with Depo-Medrol and Marcaine. Anesthesia reversed.  Brought to the recovery  room.  Tolerated the surgery well.  No complications.     Loreta Ave, M.D.     DFM/MEDQ  D:  08/20/2011  T:  08/20/2011  Job:  [id number]

## 2011-08-30 ENCOUNTER — Other Ambulatory Visit: Payer: Self-pay | Admitting: Internal Medicine

## 2011-10-10 ENCOUNTER — Telehealth: Payer: Self-pay | Admitting: Internal Medicine

## 2011-10-10 DIAGNOSIS — G4733 Obstructive sleep apnea (adult) (pediatric): Secondary | ICD-10-CM

## 2011-10-10 NOTE — Telephone Encounter (Signed)
Per Florentina Addison okay to send order. I have sent the order. Will sign off message since that was all needed

## 2011-10-29 ENCOUNTER — Other Ambulatory Visit: Payer: Self-pay | Admitting: Internal Medicine

## 2011-11-30 ENCOUNTER — Encounter: Payer: Self-pay | Admitting: Internal Medicine

## 2011-11-30 ENCOUNTER — Ambulatory Visit (INDEPENDENT_AMBULATORY_CARE_PROVIDER_SITE_OTHER): Payer: BC Managed Care – PPO | Admitting: Internal Medicine

## 2011-11-30 VITALS — BP 138/70 | Temp 98.5°F | Wt 230.0 lb

## 2011-11-30 DIAGNOSIS — I10 Essential (primary) hypertension: Secondary | ICD-10-CM

## 2011-11-30 DIAGNOSIS — E119 Type 2 diabetes mellitus without complications: Secondary | ICD-10-CM

## 2011-11-30 LAB — HEMOGLOBIN A1C: Hgb A1c MFr Bld: 7.1 % — ABNORMAL HIGH (ref 4.6–6.5)

## 2011-11-30 NOTE — Patient Instructions (Signed)
Limit your sodium (Salt) intake    It is important that you exercise regularly, at least 20 minutes 3 to 4 times per week.  If you develop chest pain or shortness of breath seek  medical attention.  You need to lose weight.  Consider a lower calorie diet and regular exercise. 

## 2011-11-30 NOTE — Progress Notes (Signed)
  Subjective:    Patient ID: Kayla [name], female    DOB: 04/08/1961, 51 y.o.   MRN: [id number]  HPI 51 year old patient who is seen today for followup of hypertension and type 2 diabetes. Diabetes is controlled with low-dose metformin therapy. She tolerates her medications well. She did have left knee arthroscopic surgery performed in December. Unfortunately still has knee pain related to underlying arthritic changes.    Review of Systems  Constitutional: Negative.   HENT: Negative for hearing loss, congestion, sore throat, rhinorrhea, dental problem, sinus pressure and tinnitus.   Eyes: Negative for pain, discharge and visual disturbance.  Respiratory: Negative for cough and shortness of breath.   Cardiovascular: Negative for chest pain, palpitations and leg swelling.  Gastrointestinal: Negative for nausea, vomiting, abdominal pain, diarrhea, constipation, blood in stool and abdominal distention.  Genitourinary: Negative for dysuria, urgency, frequency, hematuria, flank pain, vaginal bleeding, vaginal discharge, difficulty urinating, vaginal pain and pelvic pain.  Musculoskeletal: Negative for joint swelling, arthralgias (left knee pain) and gait problem.  Skin: Negative for rash.  Neurological: Negative for dizziness, syncope, speech difficulty, weakness, numbness and headaches.  Hematological: Negative for adenopathy.  Psychiatric/Behavioral: Negative for behavioral problems, dysphoric mood and agitation. The patient is not nervous/anxious.        Objective:   Physical Exam  Constitutional: She is oriented to person, place, and time. She appears well-developed and well-nourished.       Overweight. Weight 2:30 Repeat blood pressure 110/70  HENT:  Head: Normocephalic.  Right Ear: External ear normal.  Left Ear: External ear normal.  Mouth/Throat: Oropharynx is clear and moist.  Eyes: Conjunctivae and EOM are normal. Pupils are equal, round, and reactive to light.  Neck:  Normal range of motion. Neck supple. No thyromegaly present.  Cardiovascular: Normal rate, regular rhythm, normal heart sounds and intact distal pulses.   Pulmonary/Chest: Effort normal and breath sounds normal.  Abdominal: Soft. Bowel sounds are normal. She exhibits no mass. There is no tenderness.  Musculoskeletal: Normal range of motion.  Lymphadenopathy:    She has no cervical adenopathy.  Neurological: She is alert and oriented to person, place, and time.  Skin: Skin is warm and dry. No rash noted.  Psychiatric: She has a normal mood and affect. Her behavior is normal.          Assessment & Plan:   Hypertension well controlled. We'll continue present regimen. Low salt diet exercise weight loss all encouraged Diabetes mellitus. We'll check a hemoglobin A1c. Lifestyle treatment stress

## 2012-01-15 ENCOUNTER — Ambulatory Visit (INDEPENDENT_AMBULATORY_CARE_PROVIDER_SITE_OTHER): Payer: BC Managed Care – PPO | Admitting: Internal Medicine

## 2012-01-15 ENCOUNTER — Encounter: Payer: Self-pay | Admitting: Internal Medicine

## 2012-01-15 VITALS — BP 110/74 | Temp 98.4°F | Wt 223.0 lb

## 2012-01-15 DIAGNOSIS — J069 Acute upper respiratory infection, unspecified: Secondary | ICD-10-CM

## 2012-01-15 DIAGNOSIS — I10 Essential (primary) hypertension: Secondary | ICD-10-CM

## 2012-01-15 DIAGNOSIS — J029 Acute pharyngitis, unspecified: Secondary | ICD-10-CM

## 2012-01-15 DIAGNOSIS — E119 Type 2 diabetes mellitus without complications: Secondary | ICD-10-CM

## 2012-01-15 MED ORDER — HYDROCODONE-HOMATROPINE 5-1.5 MG/5ML PO SYRP: 5.0000 mL | ORAL_SOLUTION | Freq: Four times a day (QID) | ORAL | Status: AC | PRN

## 2012-01-15 NOTE — Progress Notes (Signed)
  Subjective:    Patient ID: Kayla [name], female    DOB: 05-18-1961, 51 y.o.   MRN: [id number]  HPI  51 year old patient his medical problems include hypertension and type 2 diabetes. She has osteoarthritis and is scheduled for bilateral simultaneous knee replacement surgeries at the end of this month. For the past 10 days she has had some cold symptoms with head congestion and cough this has worsened over the past week. Cough is nonproductive denies any shortness of breath or wheezing.    Review of Systems  Constitutional: Positive for fatigue.  HENT: Positive for congestion. Negative for hearing loss, sore throat, rhinorrhea, dental problem, sinus pressure and tinnitus.   Eyes: Negative for pain, discharge and visual disturbance.  Respiratory: Positive for cough. Negative for shortness of breath.   Cardiovascular: Negative for chest pain, palpitations and leg swelling.  Gastrointestinal: Negative for nausea, vomiting, abdominal pain, diarrhea, constipation, blood in stool and abdominal distention.  Genitourinary: Negative for dysuria, urgency, frequency, hematuria, flank pain, vaginal bleeding, vaginal discharge, difficulty urinating, vaginal pain and pelvic pain.  Musculoskeletal: Negative for joint swelling, arthralgias and gait problem.  Skin: Negative for rash.  Neurological: Negative for dizziness, syncope, speech difficulty, weakness, numbness and headaches.  Hematological: Negative for adenopathy.  Psychiatric/Behavioral: Negative for behavioral problems, dysphoric mood and agitation. The patient is not nervous/anxious.        Objective:   Physical Exam  Constitutional: She is oriented to person, place, and time. She appears well-developed and well-nourished.  HENT:  Head: Normocephalic.  Right Ear: External ear normal.  Left Ear: External ear normal.       Mild erythema of the oropharynx  Eyes: Conjunctivae and EOM are normal. Pupils are equal, round, and reactive to  light.  Neck: Normal range of motion. Neck supple. No thyromegaly present.  Cardiovascular: Normal rate, regular rhythm, normal heart sounds and intact distal pulses.   Pulmonary/Chest: Effort normal and breath sounds normal.       O2 saturation 98  Abdominal: Soft. Bowel sounds are normal. She exhibits no mass. There is no tenderness.  Musculoskeletal: Normal range of motion.  Lymphadenopathy:    She has no cervical adenopathy.  Neurological: She is alert and oriented to person, place, and time.  Skin: Skin is warm and dry. No rash noted.  Psychiatric: She has a normal mood and affect. Her behavior is normal.          Assessment & Plan:   Viral URI Hypertension well controlled Diabetes mellitus  Will treat symptomatically. Will call if she develops any worsening symptoms or fever

## 2012-01-15 NOTE — Patient Instructions (Signed)
Get plenty of rest, Drink lots of  clear liquids, and use Tylenol or ibuprofen for fever and discomfort.    nasonex  Use 2 puffs each nares daily

## 2012-01-18 ENCOUNTER — Other Ambulatory Visit: Payer: Self-pay | Admitting: Internal Medicine

## 2012-01-23 ENCOUNTER — Encounter (HOSPITAL_COMMUNITY): Payer: Self-pay | Admitting: Respiratory Therapy

## 2012-01-23 ENCOUNTER — Telehealth: Payer: Self-pay | Admitting: *Deleted

## 2012-01-23 NOTE — Telephone Encounter (Signed)
Attempt to call Dr. Greig Right office - spoke with Jasmine December - both doctor and PA in surgery - will leave msg for them to call back r/t possible BP issues noted at her pro op visit yesterday - I got VM - but could not understand info -   VM for Threresa - LMTCB if questions - waiting for D.r Murphy's office to return call - but it will probably be tomorrow since they are in surg today. KIK

## 2012-01-23 NOTE — Telephone Encounter (Signed)
Pt states Dr. Greig Right office called with information for Dr Kirtland Bouchard that her BP was different in each arm. Pt wants to talk to Selena Batten to see if this is going to interfere with her surgery.  Attempted to call x 3 but busy.

## 2012-01-24 ENCOUNTER — Telehealth: Payer: Self-pay

## 2012-01-24 NOTE — Telephone Encounter (Signed)
Attempt to call- ANS mach - LMTCB and make appt to be seen to eval BPs further - will see Friday or Monday. KIK

## 2012-01-24 NOTE — Telephone Encounter (Signed)
Suggest ROV to evaluate for possible left subclavian artery stenosis

## 2012-01-24 NOTE — Telephone Encounter (Signed)
recv'd call from Dr. Greig Right office - pre-op BPs different in each arm per PA (L) 89/61 (R) 111/78 Having total knee replacement 02/06/12 - did you see this as a problem - please advise

## 2012-01-25 ENCOUNTER — Encounter (HOSPITAL_COMMUNITY)
Admission: RE | Admit: 2012-01-25 | Discharge: 2012-01-25 | Disposition: A | Discharge status: Home / Self Care | Payer: BC Managed Care – PPO | Source: Ambulatory Visit | Attending: Surgery | Admitting: Surgery

## 2012-01-25 ENCOUNTER — Encounter: Payer: Self-pay | Admitting: Internal Medicine

## 2012-01-25 ENCOUNTER — Ambulatory Visit (INDEPENDENT_AMBULATORY_CARE_PROVIDER_SITE_OTHER): Payer: BC Managed Care – PPO | Admitting: Internal Medicine

## 2012-01-25 ENCOUNTER — Encounter (HOSPITAL_COMMUNITY)
Admission: RE | Admit: 2012-01-25 | Discharge: 2012-01-25 | Disposition: A | Discharge status: Home / Self Care | Payer: BC Managed Care – PPO | Source: Ambulatory Visit | Attending: Orthopedic Surgery | Admitting: Orthopedic Surgery

## 2012-01-25 ENCOUNTER — Encounter (HOSPITAL_COMMUNITY): Payer: Self-pay

## 2012-01-25 VITALS — BP 102/70 | Wt 226.0 lb

## 2012-01-25 DIAGNOSIS — E785 Hyperlipidemia, unspecified: Secondary | ICD-10-CM

## 2012-01-25 DIAGNOSIS — I10 Essential (primary) hypertension: Secondary | ICD-10-CM

## 2012-01-25 DIAGNOSIS — E119 Type 2 diabetes mellitus without complications: Secondary | ICD-10-CM

## 2012-01-25 HISTORY — DX: Sleep apnea, unspecified: G47.30

## 2012-01-25 LAB — URINALYSIS, ROUTINE W REFLEX MICROSCOPIC
Bilirubin Urine: NEGATIVE
Nitrite: NEGATIVE
Specific Gravity, Urine: 1.004 — ABNORMAL LOW (ref 1.005–1.030)
Urobilinogen, UA: 0.2 mg/dL (ref 0.0–1.0)
pH: 6.5 (ref 5.0–8.0)

## 2012-01-25 LAB — COMPREHENSIVE METABOLIC PANEL
ALT: 28 U/L (ref 0–35)
Albumin: 3.9 g/dL (ref 3.5–5.2)
Alkaline Phosphatase: 76 U/L (ref 39–117)
BUN: 11 mg/dL (ref 6–23)
Chloride: 101 mEq/L (ref 96–112)
GFR calc Af Amer: 85 mL/min — ABNORMAL LOW (ref >90)
Glucose, Bld: 145 mg/dL — ABNORMAL HIGH (ref 70–99)
Potassium: 3.7 mEq/L (ref 3.5–5.1)
Sodium: 140 mEq/L (ref 135–145)
Total Bilirubin: 0.3 mg/dL (ref 0.3–1.2)
Total Protein: 6.9 g/dL (ref 6.0–8.3)

## 2012-01-25 LAB — SURGICAL PCR SCREEN: MRSA, PCR: NEGATIVE

## 2012-01-25 LAB — CBC
HCT: 37.4 % (ref 36.0–46.0)
Hemoglobin: 13 g/dL (ref 12.0–15.0)
RBC: 4.43 MIL/uL (ref 3.87–5.11)
WBC: 9.5 10*3/uL (ref 4.0–10.5)

## 2012-01-25 LAB — PROTIME-INR: Prothrombin Time: 13 seconds (ref 11.6–15.2)

## 2012-01-25 LAB — APTT: aPTT: 28 seconds (ref 24–37)

## 2012-01-25 LAB — TYPE AND SCREEN: Antibody Screen: NEGATIVE

## 2012-01-25 LAB — ABO/RH: ABO/RH(D): A POS

## 2012-01-25 NOTE — Progress Notes (Signed)
  Subjective:    Patient ID: Kayla [name], female    DOB: [birth year], 51 y.o.   MRN: [id number]  HPI  51 year old patient who has a history of hypertension. She has multiple cardiovascular risk factors including diabetes and dyslipidemia. She is scheduled for elective staged bilateral knee replacement surgery later this month. Preop evaluation suggested a possible left subclavian artery stenosis with decrease in left brachial artery blood pressure readings. The patient was seen today to rule out significant vascular disease. She is asymptomatic. Denies any claudication exertional chest pain or any history of focal neurological deficit    Review of Systems  Constitutional: Negative.        Objective:   Physical Exam  Constitutional: She appears well-developed and well-nourished. No distress.       Blood pressure left arm 110/70; Blood pressure right arm 104/64  Radial and ulnar pulses were full No carotid or supraclavicular bruits Pedal pulses were full          Assessment & Plan:   Hypertension. Appears to be well controlled. No significant discrepancy in arm blood pressure readings to suggest subclavian or axillary artery stenosis. In fact,  blood pressure slightly diminished on the right compared to the left today. No clinical evidence of vascular disease Dyslipidemia Type 2 diabetes  The patient has been medically cleared for surgery; do not feel that any further preoperative studies are necessary. She is scheduled for preoperative lab

## 2012-01-25 NOTE — Telephone Encounter (Signed)
Spoke with pt- appt made today for 115pm

## 2012-01-25 NOTE — Pre-Procedure Instructions (Addendum)
20 Kayla [name]  01/25/2012   Your procedure is scheduled on:  Feb 06, 2012  Report to [facility] at 0630 AM.  Call this number if you have problems the morning of surgery: [phone]   Remember:   Do not eat food:After Midnight.  May have clear liquids: up to 4 Hours before arrival.  Clear liquids include soda, tea, black coffee, apple or grape juice, broth.  Take these medicines the morning of surgery with A SIP OF WATER: ativan (if needed), ditropan, zoloft   Do not wear jewelry, make-up or nail polish.  Do not wear lotions, powders, or perfumes. You may wear deodorant.  Do not shave 48 hours prior to surgery. Men may shave face and neck.  Do not bring valuables to the hospital.  Contacts, dentures or bridgework may not be worn into surgery.  Leave suitcase in the car. After surgery it may be brought to your room.  For patients admitted to the hospital, checkout time is 11:00 AM the day of discharge.   Patients discharged the day of surgery will not be allowed to drive home.  Special Instructions: Incentive Spirometry - Practice and bring it with you on the day of surgery. and CHG Shower Use Special Wash: 1/2 bottle night before surgery and 1/2 bottle morning of surgery.   Please read over the following fact sheets that you were given: Pain Booklet, Coughing and Deep Breathing, Blood Transfusion Information, MRSA Information and Surgical Site Infection Prevention

## 2012-01-25 NOTE — Patient Instructions (Signed)
Limit your sodium (Salt) intake  Please check your blood pressure on a regular basis.  If it is consistently greater than 150/90, please make an office appointment.   

## 2012-02-05 MED ORDER — CEFAZOLIN SODIUM-DEXTROSE 2-3 GM-% IV SOLR
2.0000 g | INTRAVENOUS | Status: DC
  Filled 2012-02-05: qty 50

## 2012-02-05 MED ORDER — CEFAZOLIN SODIUM-DEXTROSE 2-3 GM-% IV SOLR
2.0000 g | INTRAVENOUS | Status: AC
  Administered 2012-02-06: 2 g via INTRAVENOUS

## 2012-02-05 NOTE — H&P (Signed)
[facility] [address]   [address] [place], [place] [address] [phone] A Division of [facility]  Loreta Ave, M.D.     Robert A. Thurston Hole, M.D.     Lunette Stands, M.D. Eulas Post, M.D.    Buford Dresser, M.D. Estell Harpin, M.D. Ralene Cork, D.O.          Genene Churn. Barry Dienes, PA-C            Kirstin A. Shepperson, PA-C Janace Litten, OPA-C   RE: Kayla [name], Kayla [name]                                [id number]      DOB: 05/12/1961 PROGRESS NOTE: 01-22-12 Chief complaint: Bilateral knee pain.  History of present illness: 51 year-old white female who presents to the office today with the above complaint.  Known history of end stage DJD.  She was originally scheduled to have bilateral total knee replacements on May 29th, but she realizes that this will be difficult since she has a 66 year-old child and she will have limited assistance post-op.  States that she would like to do the right knee and possibly do the right 4-6 weeks after.   Current medications: Atorvastatin, Clonazepam, Lisinopril/HCTZ, Lorazepam, Meloxicam, Metformin, Oxybutynin, Sertraline and Ambien. Allergies: Question Morphine. Past medical/surgical history: Hysterectomy and bladder tack procedure, left rotator cuff repair, bilateral knee scopes, hypercholesterolemia, hypertension and Type II diabetes. Review of systems: Patient denies fevers or chills.  She does admit to some feeling of lightheadedness when she stands too quickly.  Denies chest pain or shortness of breath, palpitations, GI or GU issues. Family history: Positive for heart disease, hypertension, diabetes, cancer and stroke.   Social history: Does not smoke or drink.  Patient is married.  She is a Public librarian.    EXAMINATION: Height: 5?2.  Weight: 224 pounds.  Pulse: 78.  Temperature: 98.6.  Blood pressure left arm: 89/61 and rechecked 89/76.  Blood pressure right arm: 111/78.   Pleasant white female, alert and oriented x 3 and in no acute distress.  No increase in respiratory effort.  Head is normocephalic, a traumatic.  PERRLA, EOMI.  Cervical spine unremarkable.  Lungs: CTA bilaterally.  No wheezes noted.  Heart: RRR.  S1 and S2.  No murmurs noted.  Abdomen: Round and non-distended.  NBS x 4.  Soft and non-tender.  Left knee: Decreased range of motion.  Positive crepitus.  Joint line tender.  Positive effusion.  Positive effusion.  Calf non-tender.  Neurovascularly intact.  Skin warm and dry. Gait antalgic.    IMPRESSION: End stage DJD, right knee, and chronic pain.  Failed conservative treatment.  PLAN: We will proceed with right total knee replacement on May 29th.  If she rehabs well we will consider doing the left knee 4-6 weeks post-op and again patient is very comfortable with this approach.  I did call her primary care physician, Dr. Vernon Prey office, in regards to blood pressures taken today and to see if they would like to see Ms. Dascoli and decide whether or not adjustments need to be made to her current blood pressure medication.  Patient states that she will also call his office to discuss this matter.  All questions answered.    Loreta Ave, M.D.   Electronically verified by Loreta Ave, M.D. DFM(JMO):jjh Dr. Eleonore Chiquito, fax: [phone]  D 01-22-12  T 01-23-12

## 2012-02-06 ENCOUNTER — Ambulatory Visit (HOSPITAL_COMMUNITY): Payer: BC Managed Care – PPO | Admitting: Anesthesiology

## 2012-02-06 ENCOUNTER — Inpatient Hospital Stay (HOSPITAL_COMMUNITY): Payer: BC Managed Care – PPO

## 2012-02-06 ENCOUNTER — Encounter (HOSPITAL_COMMUNITY): Payer: Self-pay | Admitting: Anesthesiology

## 2012-02-06 ENCOUNTER — Encounter (HOSPITAL_COMMUNITY): Payer: Self-pay | Admitting: *Deleted

## 2012-02-06 ENCOUNTER — Encounter (HOSPITAL_COMMUNITY)
Admission: RE | Disposition: A | Discharge status: Home / Self Care | Payer: Self-pay | Source: Ambulatory Visit | Attending: Orthopedic Surgery

## 2012-02-06 ENCOUNTER — Inpatient Hospital Stay (HOSPITAL_COMMUNITY)
Admission: RE | Admit: 2012-02-06 | Discharge: 2012-02-08 | DRG: 209 | Disposition: A | Discharge status: Home / Self Care | Payer: BC Managed Care – PPO | Source: Ambulatory Visit | Attending: Orthopedic Surgery | Admitting: Orthopedic Surgery

## 2012-02-06 DIAGNOSIS — G8929 Other chronic pain: Secondary | ICD-10-CM | POA: Diagnosis present

## 2012-02-06 DIAGNOSIS — E119 Type 2 diabetes mellitus without complications: Secondary | ICD-10-CM | POA: Diagnosis present

## 2012-02-06 DIAGNOSIS — Z79899 Other long term (current) drug therapy: Secondary | ICD-10-CM

## 2012-02-06 DIAGNOSIS — I10 Essential (primary) hypertension: Secondary | ICD-10-CM | POA: Diagnosis present

## 2012-02-06 DIAGNOSIS — E785 Hyperlipidemia, unspecified: Secondary | ICD-10-CM | POA: Diagnosis present

## 2012-02-06 DIAGNOSIS — M1710 Unilateral primary osteoarthritis, unspecified knee: Principal | ICD-10-CM | POA: Diagnosis present

## 2012-02-06 DIAGNOSIS — Z471 Aftercare following joint replacement surgery: Secondary | ICD-10-CM

## 2012-02-06 DIAGNOSIS — Z01812 Encounter for preprocedural laboratory examination: Secondary | ICD-10-CM

## 2012-02-06 HISTORY — PX: TOTAL KNEE ARTHROPLASTY: SHX125

## 2012-02-06 LAB — GLUCOSE, CAPILLARY
Glucose-Capillary: 129 mg/dL — ABNORMAL HIGH (ref 70–99)
Glucose-Capillary: 149 mg/dL — ABNORMAL HIGH (ref 70–99)

## 2012-02-06 SURGERY — ARTHROPLASTY, KNEE, TOTAL
Anesthesia: General | Site: Knee | Laterality: Right | Wound class: Clean

## 2012-02-06 MED ORDER — INSULIN ASPART 100 UNIT/ML ~~LOC~~ SOLN
0.0000 [IU] | Freq: Three times a day (TID) | SUBCUTANEOUS | Status: DC
  Administered 2012-02-08: 2 [IU] via SUBCUTANEOUS

## 2012-02-06 MED ORDER — SODIUM CHLORIDE 0.9 % IR SOLN
Status: DC | PRN
  Administered 2012-02-06: 3000 mL

## 2012-02-06 MED ORDER — OXYCODONE-ACETAMINOPHEN 5-325 MG PO TABS
1.0000 | ORAL_TABLET | ORAL | Status: DC | PRN
  Administered 2012-02-07 (×4): 1 via ORAL
  Administered 2012-02-08 (×3): 2 via ORAL
  Filled 2012-02-06 (×4): qty 2
  Filled 2012-02-06 (×2): qty 1
  Filled 2012-02-06: qty 2

## 2012-02-06 MED ORDER — ACETAMINOPHEN 650 MG RE SUPP: 650.0000 mg | Freq: Four times a day (QID) | RECTAL | Status: DC | PRN

## 2012-02-06 MED ORDER — WARFARIN VIDEO
1.0000 | Freq: Once | Status: AC
  Administered 2012-02-07: 1

## 2012-02-06 MED ORDER — ONDANSETRON HCL 4 MG/2ML IJ SOLN: 4.0000 mg | Freq: Four times a day (QID) | INTRAMUSCULAR | Status: DC | PRN

## 2012-02-06 MED ORDER — SERTRALINE HCL 100 MG PO TABS
100.0000 mg | ORAL_TABLET | Freq: Every day | ORAL | Status: DC
  Administered 2012-02-07: 100 mg via ORAL
  Filled 2012-02-06 (×4): qty 1

## 2012-02-06 MED ORDER — PROPOFOL 10 MG/ML IV EMUL
INTRAVENOUS | Status: DC | PRN
  Administered 2012-02-06: 200 mg via INTRAVENOUS

## 2012-02-06 MED ORDER — DROPERIDOL 2.5 MG/ML IJ SOLN: 0.6250 mg | INTRAMUSCULAR | Status: DC | PRN

## 2012-02-06 MED ORDER — METHOCARBAMOL 500 MG PO TABS
500.0000 mg | ORAL_TABLET | Freq: Four times a day (QID) | ORAL | Status: DC | PRN
Start: 2012-02-06 — End: 2012-02-08
  Administered 2012-02-07 – 2012-02-08 (×5): 500 mg via ORAL
  Filled 2012-02-06 (×5): qty 1

## 2012-02-06 MED ORDER — METOCLOPRAMIDE HCL 10 MG PO TABS: 5.0000 mg | ORAL_TABLET | Freq: Three times a day (TID) | ORAL | Status: DC | PRN

## 2012-02-06 MED ORDER — DOCUSATE SODIUM 100 MG PO CAPS
100.0000 mg | ORAL_CAPSULE | Freq: Two times a day (BID) | ORAL | Status: DC
  Administered 2012-02-06 – 2012-02-08 (×4): 100 mg via ORAL
  Filled 2012-02-06 (×5): qty 1

## 2012-02-06 MED ORDER — MORPHINE SULFATE (PF) 1 MG/ML IV SOLN
INTRAVENOUS | Status: DC
  Administered 2012-02-06: 18 mg via INTRAVENOUS
  Administered 2012-02-06 – 2012-02-07 (×2): via INTRAVENOUS
  Filled 2012-02-06 (×2): qty 25

## 2012-02-06 MED ORDER — ONDANSETRON HCL 4 MG PO TABS: 4.0000 mg | ORAL_TABLET | Freq: Four times a day (QID) | ORAL | Status: DC | PRN

## 2012-02-06 MED ORDER — METFORMIN HCL ER 500 MG PO TB24
500.0000 mg | ORAL_TABLET | Freq: Every day | ORAL | Status: DC
  Administered 2012-02-07 – 2012-02-08 (×2): 500 mg via ORAL
  Filled 2012-02-06 (×3): qty 1

## 2012-02-06 MED ORDER — ONDANSETRON HCL 4 MG/2ML IJ SOLN
4.0000 mg | Freq: Four times a day (QID) | INTRAMUSCULAR | Status: DC | PRN
  Administered 2012-02-06: 4 mg via INTRAVENOUS
  Filled 2012-02-06: qty 2

## 2012-02-06 MED ORDER — OXYBUTYNIN CHLORIDE ER 15 MG PO TB24
15.0000 mg | ORAL_TABLET | Freq: Every day | ORAL | Status: DC
  Administered 2012-02-06 – 2012-02-08 (×3): 15 mg via ORAL
  Filled 2012-02-06 (×4): qty 1

## 2012-02-06 MED ORDER — SIMVASTATIN 40 MG PO TABS
40.0000 mg | ORAL_TABLET | Freq: Every day | ORAL | Status: DC
  Administered 2012-02-06 – 2012-02-07 (×2): 40 mg via ORAL
  Filled 2012-02-06 (×3): qty 1

## 2012-02-06 MED ORDER — ACETAMINOPHEN 325 MG PO TABS: 650.0000 mg | ORAL_TABLET | Freq: Four times a day (QID) | ORAL | Status: DC | PRN

## 2012-02-06 MED ORDER — CEFAZOLIN SODIUM 1-5 GM-% IV SOLN
1.0000 g | Freq: Three times a day (TID) | INTRAVENOUS | Status: AC
  Administered 2012-02-06 – 2012-02-07 (×3): 1 g via INTRAVENOUS
  Filled 2012-02-06 (×3): qty 50

## 2012-02-06 MED ORDER — DIPHENHYDRAMINE HCL 12.5 MG/5ML PO ELIX: 12.5000 mg | ORAL_SOLUTION | Freq: Four times a day (QID) | ORAL | Status: DC | PRN

## 2012-02-06 MED ORDER — MORPHINE SULFATE 4 MG/ML IJ SOLN
INTRAMUSCULAR | Status: DC | PRN
  Administered 2012-02-06: 4 mg via INTRAVENOUS

## 2012-02-06 MED ORDER — PHENOL 1.4 % MT LIQD: 1.0000 | OROMUCOSAL | Status: DC | PRN

## 2012-02-06 MED ORDER — NALOXONE HCL 0.4 MG/ML IJ SOLN: 0.4000 mg | INTRAMUSCULAR | Status: DC | PRN

## 2012-02-06 MED ORDER — WARFARIN SODIUM 7.5 MG PO TABS
7.5000 mg | ORAL_TABLET | Freq: Once | ORAL | Status: AC
  Administered 2012-02-06: 7.5 mg via ORAL
  Filled 2012-02-06: qty 1

## 2012-02-06 MED ORDER — BUPIVACAINE HCL (PF) 0.25 % IJ SOLN
INTRAMUSCULAR | Status: DC | PRN
  Administered 2012-02-06: 30 mL

## 2012-02-06 MED ORDER — FENTANYL CITRATE 0.05 MG/ML IJ SOLN
INTRAMUSCULAR | Status: DC | PRN
  Administered 2012-02-06 (×2): 100 ug via INTRAVENOUS
  Administered 2012-02-06 (×2): 50 ug via INTRAVENOUS
  Administered 2012-02-06: 100 ug via INTRAVENOUS

## 2012-02-06 MED ORDER — MIDAZOLAM HCL 5 MG/5ML IJ SOLN
INTRAMUSCULAR | Status: DC | PRN
  Administered 2012-02-06: 2 mg via INTRAVENOUS

## 2012-02-06 MED ORDER — MAGNESIUM CITRATE PO SOLN
1.0000 | Freq: Once | ORAL | Status: AC | PRN
  Filled 2012-02-06: qty 296

## 2012-02-06 MED ORDER — HYDROMORPHONE HCL PF 1 MG/ML IJ SOLN
0.2500 mg | INTRAMUSCULAR | Status: DC | PRN
  Administered 2012-02-06 (×3): 0.5 mg via INTRAVENOUS

## 2012-02-06 MED ORDER — METOCLOPRAMIDE HCL 5 MG/ML IJ SOLN: 5.0000 mg | Freq: Three times a day (TID) | INTRAMUSCULAR | Status: DC | PRN

## 2012-02-06 MED ORDER — METHOCARBAMOL 100 MG/ML IJ SOLN
500.0000 mg | INTRAVENOUS | Status: AC
  Administered 2012-02-06: 500 mg via INTRAVENOUS
  Filled 2012-02-06: qty 5

## 2012-02-06 MED ORDER — SODIUM CHLORIDE 0.9 % IJ SOLN: 9.0000 mL | INTRAMUSCULAR | Status: DC | PRN

## 2012-02-06 MED ORDER — CLONAZEPAM 0.5 MG PO TABS: 0.5000 mg | ORAL_TABLET | Freq: Every evening | ORAL | Status: DC | PRN

## 2012-02-06 MED ORDER — BUPIVACAINE-EPINEPHRINE PF 0.5-1:200000 % IJ SOLN
INTRAMUSCULAR | Status: DC | PRN
  Administered 2012-02-06: 150 mg

## 2012-02-06 MED ORDER — MENTHOL 3 MG MT LOZG: 1.0000 | LOZENGE | OROMUCOSAL | Status: DC | PRN

## 2012-02-06 MED ORDER — WARFARIN - PHARMACIST DOSING INPATIENT
Freq: Every day | Status: DC
  Administered 2012-02-06: 18:00:00

## 2012-02-06 MED ORDER — DIPHENHYDRAMINE HCL 50 MG/ML IJ SOLN
12.5000 mg | Freq: Four times a day (QID) | INTRAMUSCULAR | Status: DC | PRN
  Administered 2012-02-07: 12.5 mg via INTRAVENOUS
  Filled 2012-02-06: qty 1

## 2012-02-06 MED ORDER — ENOXAPARIN SODIUM 30 MG/0.3ML ~~LOC~~ SOLN
30.0000 mg | Freq: Two times a day (BID) | SUBCUTANEOUS | Status: DC
  Administered 2012-02-07 – 2012-02-08 (×3): 30 mg via SUBCUTANEOUS
  Filled 2012-02-06 (×5): qty 0.3

## 2012-02-06 MED ORDER — ONDANSETRON HCL 4 MG/2ML IJ SOLN
INTRAMUSCULAR | Status: DC | PRN
  Administered 2012-02-06: 4 mg via INTRAVENOUS

## 2012-02-06 MED ORDER — LORAZEPAM 0.5 MG PO TABS: 0.5000 mg | ORAL_TABLET | Freq: Two times a day (BID) | ORAL | Status: DC | PRN

## 2012-02-06 MED ORDER — METHOCARBAMOL 100 MG/ML IJ SOLN
500.0000 mg | Freq: Four times a day (QID) | INTRAVENOUS | Status: DC | PRN
  Filled 2012-02-06: qty 5

## 2012-02-06 MED ORDER — COUMADIN BOOK
1.0000 | Freq: Once | Status: AC
  Administered 2012-02-06: 1
  Filled 2012-02-06: qty 1

## 2012-02-06 MED ORDER — LACTATED RINGERS IV SOLN
INTRAVENOUS | Status: DC | PRN
  Administered 2012-02-06: 08:00:00 via INTRAVENOUS

## 2012-02-06 MED ORDER — SENNOSIDES-DOCUSATE SODIUM 8.6-50 MG PO TABS: 1.0000 | ORAL_TABLET | Freq: Every evening | ORAL | Status: DC | PRN

## 2012-02-06 MED ORDER — POTASSIUM CHLORIDE IN NACL 20-0.9 MEQ/L-% IV SOLN
INTRAVENOUS | Status: DC
  Administered 2012-02-06 – 2012-02-07 (×2): via INTRAVENOUS
  Filled 2012-02-06 (×5): qty 1000

## 2012-02-06 SURGICAL SUPPLY — 55 items
BANDAGE ESMARK 6X9 LF (GAUZE/BANDAGES/DRESSINGS) ×1 IMPLANT
BLADE SAG 18X100X1.27 (BLADE) ×4 IMPLANT
BNDG ESMARK 6X9 LF (GAUZE/BANDAGES/DRESSINGS) ×2
BOOTCOVER CLEANROOM LRG (PROTECTIVE WEAR) ×4 IMPLANT
BOWL SMART MIX CTS (DISPOSABLE) ×2 IMPLANT
CEMENT BONE SIMPLEX SPEEDSET (Cement) ×4 IMPLANT
CLOTH BEACON ORANGE TIMEOUT ST (SAFETY) ×2 IMPLANT
COVER BACK TABLE 24X17X13 BIG (DRAPES) IMPLANT
COVER SURGICAL LIGHT HANDLE (MISCELLANEOUS) ×4 IMPLANT
CUFF TOURNIQUET SINGLE 34IN LL (TOURNIQUET CUFF) ×2 IMPLANT
DRAPE EXTREMITY T 121X128X90 (DRAPE) ×2 IMPLANT
DRAPE PROXIMA HALF (DRAPES) ×2 IMPLANT
DRAPE U-SHAPE 47X51 STRL (DRAPES) ×2 IMPLANT
DRSG PAD ABDOMINAL 8X10 ST (GAUZE/BANDAGES/DRESSINGS) ×2 IMPLANT
DURAPREP 26ML APPLICATOR (WOUND CARE) ×2 IMPLANT
ELECT CAUTERY BLADE 6.4 (BLADE) ×2 IMPLANT
ELECT REM PT RETURN 9FT ADLT (ELECTROSURGICAL) ×2
ELECTRODE REM PT RTRN 9FT ADLT (ELECTROSURGICAL) ×1 IMPLANT
EVACUATOR 1/8 PVC DRAIN (DRAIN) ×2 IMPLANT
FACESHIELD LNG OPTICON STERILE (SAFETY) ×2 IMPLANT
GAUZE XEROFORM 5X9 LF (GAUZE/BANDAGES/DRESSINGS) ×2 IMPLANT
GLOVE BIOGEL PI IND STRL 8 (GLOVE) ×1 IMPLANT
GLOVE BIOGEL PI INDICATOR 8 (GLOVE) ×1
GLOVE ORTHO TXT STRL SZ7.5 (GLOVE) ×2 IMPLANT
GOWN PREVENTION PLUS XLARGE (GOWN DISPOSABLE) IMPLANT
GOWN STRL NON-REIN LRG LVL3 (GOWN DISPOSABLE) IMPLANT
GOWN STRL REIN 2XL XLG LVL4 (GOWN DISPOSABLE) IMPLANT
HANDPIECE INTERPULSE COAX TIP (DISPOSABLE) ×1
IMMOBILIZER KNEE 22 UNIV (SOFTGOODS) ×2 IMPLANT
IMMOBILIZER KNEE 24 THIGH 36 (MISCELLANEOUS) IMPLANT
IMMOBILIZER KNEE 24 UNIV (MISCELLANEOUS)
KIT BASIN OR (CUSTOM PROCEDURE TRAY) ×2 IMPLANT
KIT ROOM TURNOVER OR (KITS) ×2 IMPLANT
MANIFOLD NEPTUNE II (INSTRUMENTS) ×2 IMPLANT
NS IRRIG 1000ML POUR BTL (IV SOLUTION) ×2 IMPLANT
PACK TOTAL JOINT (CUSTOM PROCEDURE TRAY) ×2 IMPLANT
PAD ARMBOARD 7.5X6 YLW CONV (MISCELLANEOUS) ×4 IMPLANT
PAD CAST 4YDX4 CTTN HI CHSV (CAST SUPPLIES) ×1 IMPLANT
PADDING CAST COTTON 4X4 STRL (CAST SUPPLIES) ×1
PADDING CAST COTTON 6X4 STRL (CAST SUPPLIES) ×2 IMPLANT
RUBBERBAND STERILE (MISCELLANEOUS) ×2 IMPLANT
SET HNDPC FAN SPRY TIP SCT (DISPOSABLE) ×1 IMPLANT
SPONGE GAUZE 4X4 12PLY (GAUZE/BANDAGES/DRESSINGS) ×2 IMPLANT
STAPLER VISISTAT 35W (STAPLE) ×2 IMPLANT
SUCTION FRAZIER TIP 10 FR DISP (SUCTIONS) ×2 IMPLANT
SUT VIC AB 1 CTX 36 (SUTURE) ×2
SUT VIC AB 1 CTX36XBRD ANBCTR (SUTURE) ×2 IMPLANT
SUT VIC AB 2-0 CT1 27 (SUTURE) ×2
SUT VIC AB 2-0 CT1 TAPERPNT 27 (SUTURE) ×2 IMPLANT
SYR 30ML LL (SYRINGE) ×2 IMPLANT
SYR 30ML SLIP (SYRINGE) ×2 IMPLANT
TOWEL OR 17X24 6PK STRL BLUE (TOWEL DISPOSABLE) ×2 IMPLANT
TOWEL OR 17X26 10 PK STRL BLUE (TOWEL DISPOSABLE) ×2 IMPLANT
TRAY FOLEY CATH 14FR (SET/KITS/TRAYS/PACK) ×2 IMPLANT
WATER STERILE IRR 1000ML POUR (IV SOLUTION) ×2 IMPLANT

## 2012-02-06 NOTE — Anesthesia Procedure Notes (Addendum)
Anesthesia Regional Block:  Femoral nerve block  Pre-Anesthetic Checklist: ,, timeout performed, Correct Patient, Correct Site, Correct Laterality, Correct Procedure,, site marked, risks and benefits discussed, Surgical consent,  Pre-op evaluation,  At surgeon's request and post-op pain management  Laterality: Right  Prep: chloraprep       Needles:  Injection technique: Single-shot  Needle Type: Echogenic Stimulator Needle     Needle Length: 5cm 5 cm Needle Gauge: 22 and 22 G    Additional Needles:  Procedures: ultrasound guided and nerve stimulator Femoral nerve block  Nerve Stimulator or Paresthesia:  Response: quadraceps contraction, 0.45 mA,   Additional Responses:   Narrative:  Start time: 02/06/2012 8:10 AM End time: 02/06/2012 8:22 AM Injection made incrementally with aspirations every 5 mL.  Performed by: Personally  Anesthesiologist: Halford Decamp, MD  Additional Notes: Functioning IV was confirmed and monitors were applied.  A 50mm 22ga Arrow echogenic stimulator needle was used. Sterile prep and drape,hand hygiene and sterile gloves were used. Ultrasound guidance: relevant anatomy identified, needle position confirmed, local anesthetic spread visualized around nerve(s)., vascular puncture avoided.  Image printed for medical record. Negative aspiration and negative test dose prior to incremental administration of local anesthetic. The patient tolerated the procedure well.    Femoral nerve block Procedure Name: LMA Insertion Date/Time: 02/06/2012 8:40 AM Performed by: Marena Chancy Pre-anesthesia Checklist: Patient identified, Emergency Drugs available, Suction available, Patient being monitored and Timeout performed Patient Re-evaluated:Patient Re-evaluated prior to inductionOxygen Delivery Method: Circle system utilized Preoxygenation: Pre-oxygenation with 100% oxygen Intubation Type: IV induction Ventilation: Mask ventilation without difficulty LMA:  LMA inserted LMA Size: 4.0 Number of attempts: 1 Placement Confirmation: positive ETCO2 and breath sounds checked- equal and bilateral Tube secured with: Tape Dental Injury: Teeth and Oropharynx as per pre-operative assessment

## 2012-02-06 NOTE — Consult Note (Signed)
ANTICOAGULATION CONSULT NOTE - Initial Consult  Pharmacy Consult for Coumadin with Lovenox bridging Indication: VTE prophylaxis following R-TKA  No Known Allergies  Patient Measurements: Height: 5\' 3"  (160 cm) Weight: 227 lb (102.967 kg) IBW/kg (Calculated) : 52.4   Vital Signs: Temp: 97.5 F (36.4 C) (05/29 1130) Temp src: Oral (05/29 0708) BP: 122/69 mmHg (05/29 1130) Pulse Rate: 86  (05/29 1130)  Labs: Estimated Creatinine Clearance: 84.7 ml/min (by C-G formula based on Cr of 0.88). Lab Results  Component Value Date   WBC 9.5 01/25/2012   HGB 13.0 01/25/2012   HCT 37.4 01/25/2012   MCV 84.4 01/25/2012   PLT 287 01/25/2012   Lab Results  Component Value Date   INR 0.96 01/25/2012     Medical / Surgical History: Past Medical History  Diagnosis Date  . Depressive disorder, not elsewhere classified   . Other and unspecified hyperlipidemia   . Unspecified essential hypertension   . Osteoarthritis   . Mitral valve prolapse   . Sleep apnea   . Diabetes mellitus    Past Surgical History  Procedure Date  . Rotator cuff repair   . Vesicovaginal fistula closure w/ tah   . Eye surgery     lasik bilateral   . Knee arthroscopy w/ meniscal repair   . Abdominal hysterectomy     Medications:  Prescriptions prior to admission  Medication Sig Dispense Refill  . atorvastatin (LIPITOR) 20 MG tablet Take 20 mg by mouth daily.      . clonazePAM (KLONOPIN) 1 MG tablet Take 0.5-1 mg by mouth at bedtime as needed.      Marland Kitchen lisinopril-hydrochlorothiazide (PRINZIDE,ZESTORETIC) 20-25 MG per tablet Take 1 tablet by mouth daily.  90 tablet  6  . LORazepam (ATIVAN) 0.5 MG tablet Take 0.5 mg by mouth 2 (two) times daily as needed. For anxiety      . meloxicam (MOBIC) 15 MG tablet Take 1 tablet (15 mg total) by mouth daily.  90 tablet  6  . metFORMIN (GLUCOPHAGE XR) 500 MG 24 hr tablet Take 1 tablet (500 mg total) by mouth daily with breakfast.  180 tablet  11  . oxybutynin (DITROPAN XL) 15  MG 24 hr tablet Take 1 tablet (15 mg total) by mouth daily.  90 tablet  6  . sertraline (ZOLOFT) 100 MG tablet Take 1 tablet (100 mg total) by mouth daily.  90 tablet  6  . zolpidem (AMBIEN) 10 MG tablet Take 10 mg by mouth at bedtime as needed. For sleep       Scheduled:    .  ceFAZolin (ANCEF) IV  1 g Intravenous Q8H  .  ceFAZolin (ANCEF) IV  2 g Intravenous 60 min Pre-Op  . docusate sodium  100 mg Oral BID  . enoxaparin  30 mg Subcutaneous Q12H  . insulin aspart  0-15 Units Subcutaneous TID WC  . metFORMIN  500 mg Oral Q breakfast  . methocarbamol (ROBAXIN) IV  500 mg Intravenous To PACU  . morphine   Intravenous Q4H  . oxybutynin  15 mg Oral Daily  . sertraline  100 mg Oral Daily  . simvastatin  40 mg Oral q1800  . DISCONTD:  ceFAZolin (ANCEF) IV  2 g Intravenous 60 min Pre-Op   Anti-infectives     Start     Dose/Rate Route Frequency Ordered Stop   02/06/12 1600   ceFAZolin (ANCEF) IVPB 1 g/50 mL premix        1 g 100 mL/hr over 30  Minutes Intravenous Every 8 hours 02/06/12 1314 02/07/12 1559   02/05/12 1432   ceFAZolin (ANCEF) IVPB 2 g/50 mL premix  Status:  Discontinued        2 g 100 mL/hr over 30 Minutes Intravenous 60 min pre-op 02/05/12 1432 02/06/12 1144   02/05/12 1432   ceFAZolin (ANCEF) IVPB 2 g/50 mL premix        2 g 100 mL/hr over 30 Minutes Intravenous 60 min pre-op 02/05/12 1432 02/06/12 0840          Assessment:  51 y/o female s/p R-TKA who is to be placed on Coumadin with Lovenox bridging post-op for VTE prophylaxis  Baseline INR  0.96,   Hgb 13,   Plt 287  Estimated Creatinine Clearance: 84.7 ml/min (by C-G formula based on Cr of 0.88).  No adjustments for Lovenox required.  Goal of Therapy:   INR 2-3   Plan:   Coumadin 7.5 mg po today.  Continue Lovenox 30 mg sq q 12 hours until INR therapeutic. Daily INR's, CBC. Warf ED initiated  Laurena Bering, Pharm.D. 02/06/2012 1:36 PM

## 2012-02-06 NOTE — Anesthesia Preprocedure Evaluation (Signed)
Anesthesia Evaluation  Patient identified by MRN, date of birth, ID band Patient awake    Reviewed: Allergy & Precautions, H&P , NPO status , Patient's Chart, lab work & pertinent test results, reviewed documented beta blocker date and time   History of Anesthesia Complications Negative for: history of anesthetic complications  Airway Mallampati: I TM Distance: >3 FB Neck ROM: Full    Dental  (+) Teeth Intact and Dental Advisory Given   Pulmonary  breath sounds clear to auscultation  Pulmonary exam normal       Cardiovascular hypertension, Pt. on medications Rhythm:Regular Rate:Normal     Neuro/Psych PSYCHIATRIC DISORDERS Anxiety Depression negative neurological ROS     GI/Hepatic negative GI ROS, Neg liver ROS,   Endo/Other  Diabetes mellitus-, Well Controlled, Type obesity  Renal/GU negative Renal ROS     Musculoskeletal   Abdominal   Peds  Hematology   Anesthesia Other Findings   Reproductive/Obstetrics                           Anesthesia Physical Anesthesia Plan  ASA: II  Anesthesia Plan: General   Post-op Pain Management:    Induction: Intravenous  Airway Management Planned: LMA  Additional Equipment:   Intra-op Plan:   Post-operative Plan:   Informed Consent: I have reviewed the patients History and Physical, chart, labs and discussed the procedure including the risks, benefits and alternatives for the proposed anesthesia with the patient or authorized representative who has indicated his/her understanding and acceptance.   Dental advisory given  Plan Discussed with: CRNA, Anesthesiologist and Surgeon  Anesthesia Plan Comments:         Anesthesia Quick Evaluation

## 2012-02-06 NOTE — Progress Notes (Signed)
Orthopedic Tech Progress Note Patient Details:  Kayla [name] [birth year] [id number]  CPM Right Knee CPM Right Knee: On Right Knee Flexion (Degrees): 60  Right Knee Extension (Degrees): 0  Trapeze bar patient helper  Nikki Dom 02/06/2012, 11:23 AM

## 2012-02-06 NOTE — Anesthesia Postprocedure Evaluation (Signed)
Anesthesia Post Note  Patient: Kayla [name]  Procedure(s) Performed: Procedure(s) (LRB): TOTAL KNEE ARTHROPLASTY (Right)  Anesthesia type: general  Patient location: PACU  Post pain: Pain level controlled  Post assessment: Patient's Cardiovascular Status Stable  Last Vitals:  Filed Vitals:   02/06/12 1130  BP: 122/69  Pulse: 86  Temp: 36.4 C  Resp: 14    Post vital signs: Reviewed and stable  Level of consciousness: sedated  Complications: No apparent anesthesia complications

## 2012-02-06 NOTE — Op Note (Signed)
NAMEMarland Kitchen  [name], Kayla [name] NO.:  [id number]  MEDICAL RECORD NO.:  [id number]  LOCATION:  5025                         FACILITY:  [facility]  PHYSICIAN:  Loreta Ave, M.D. DATE OF BIRTH:  [birth year]  DATE OF PROCEDURE:  02/06/2012 DATE OF DISCHARGE:                              OPERATIVE REPORT   PREOPERATIVE DIAGNOSIS:  Right knee end-stage degenerative arthritis, varus alignment.  POSTOPERATIVE DIAGNOSIS:  Right knee end-stage degenerative arthritis, varus alignment.  PROCEDURE:  Right knee modified minimally invasive total knee replacement Stryker Triathlon prosthesis.  Soft tissue balancing. Cemented pegged posterior stabilized #4 femoral component.  Cemented #4 tibial component, 9 mm polyethylene insert.  Cemented resurfacing 35 mm patellar component.  SURGEON:  Loreta Ave, M.D.  ASSISTANT:  Genene Churn. Barry Dienes, [place], present throughout the entire case, necessary for timely completion of procedure.  ANESTHESIA:  General.  BLOOD LOSS:  Minimal.  SPECIMENS:  None.  CULTURES:  None.  COMPLICATION:  None.  DRESSING:  Soft compressive with knee immobilizer.  DRAINS:  Hemovac x1.  TOURNIQUET TIME:  45 minutes.  PROCEDURE:  The patient was brought to the operating room and placed on the operating table in supine position.  After adequate anesthesia had been obtained, right knee examined.  5 degrees of varus.  Minimal flexion contracture, fairly good flexion.  Tourniquet applied.  Prepped and draped in usual sterile fashion.  Exsanguinated with elevation, Esmarch.  Tourniquet inflated to 350 mmHg.  Straight incision above the patella down to the tibial tubercle.  Hemostasis with cautery.  Medial arthrotomy, vastus splitting, preserving quad tendon.  Knee exposed. Grade 4 changes throughout.  Medial capsule released.  Remnants of menisci, cruciate ligaments, periarticular spurs removed.  Distal femur exposed.  Intramedullary guide placed.  An 8-mm cut 5  degrees of valgus with the intramedullary guide.  Using epicondylar axis, the femur was sized, cut, and fitted for a posterior stabilized pegged #4 component. Proximal tibia exposed.  Extramedullary guide.  3-degree posterior slope cut.  Taken below the defect medially.  Size 4 component.  Patella exposed, posterior 10-mm removed; drilled, sized, and fitted for a 35-mm component.  Trials put in place throughout after the knee was thoroughly cleared out all recesses.  With a #4 components above and below, a 9-mm insert and 35 patella and a medial capsule release.  I was very pleased with alignment, stability, mechanical axis, balance in flexion and extension, as well as a patellar tracking.  Tibia was marked for rotation and reamed.  All trials removed.  Copious irrigation with a pulse irrigating device.  Cement prepared and placed on all components, which were firmly seated.  Polyethylene attached to the tibia and knee reduced.  Patella held with clamp.  Once cement hardened, I again re- examined and I was pleased with the results.  Hemovac placed and brought out through a separate stab wound.  Arthrotomy was closed with #1 Vicryl, skin and subcutaneous tissue with Vicryl and staples.  A sterile compressive dressing was applied.  Tourniquet deflated and removed. Knee immobilizer applied.  Anesthesia reversed.  Brought to the recovery room.  Tolerated surgery well.  No complications.     Loreta Ave, M.D.  DFM/MEDQ  D:  02/06/2012  T:  02/06/2012  Job:  [id number]

## 2012-02-06 NOTE — Preoperative (Signed)
Beta Blockers   Reason not to administer Beta Blockers:Not Applicable 

## 2012-02-06 NOTE — Interval H&P Note (Signed)
History and Physical Interval Note:  02/06/2012 8:31 AM  Kayla [name]  has presented today for surgery, with the diagnosis of Degenerative Arthritis Right Side  The various methods of treatment have been discussed with the patient and family. After consideration of risks, benefits and other options for treatment, the patient has consented to  Procedure(s) (LRB): TOTAL KNEE ARTHROPLASTY (Right) as a surgical intervention .  The patients' history has been reviewed, patient examined, no change in status, stable for surgery.  I have reviewed the patients' chart and labs.  Questions were answered to the patient's satisfaction.     Jones Viviani F

## 2012-02-06 NOTE — Brief Op Note (Signed)
02/06/2012  1:00 PM  PATIENT:  Kayla [name]  51 y.o. female  PRE-OPERATIVE DIAGNOSIS:  Degenerative Arthritis Right Side  POST-OPERATIVE DIAGNOSIS:  Degenerative Arthritis Right Side  PROCEDURE:  Procedure(s) (LRB): TOTAL KNEE ARTHROPLASTY (Right)  SURGEON:  Surgeon(s) and Role:    * Loreta Ave, MD - Primary  PHYSICIAN ASSISTANT: Zonia Kief M   ANESTHESIA:   regional and general  EBL:  Total I/O In: 800 [I.V.:800] Out: 275 [Urine:275]  BLOOD ADMINISTERED:none  SPECIMEN:  No Specimen  DISPOSITION OF SPECIMEN:  N/A  COUNTS:  YES  TOURNIQUET:   Total Tourniquet Time Documented: Thigh (Right) - 61 minutes  PATIENT DISPOSITION:  PACU - hemodynamically stable.

## 2012-02-06 NOTE — Transfer of Care (Signed)
Immediate Anesthesia Transfer of Care Note  Patient: Kayla [name]  Procedure(s) Performed: Procedure(s) (LRB): TOTAL KNEE ARTHROPLASTY (Right)  Patient Location: PACU  Anesthesia Type: General  Level of Consciousness: awake and alert   Airway & Oxygen Therapy: Patient Spontanous Breathing and Patient connected to nasal cannula oxygen  Post-op Assessment: Report given to PACU RN and Post -op Vital signs reviewed and stable  Post vital signs: stable  Complications: No apparent anesthesia complications

## 2012-02-06 NOTE — Progress Notes (Signed)
Orthopedic Tech Progress Note Patient Details:  Kayla [name] 12-Dec-1960 [id number]  CPM Right Knee CPM Right Knee: On Right Knee Flexion (Degrees): 60  Right Knee Extension (Degrees): 0    Nikki Dom 02/06/2012, 11:24 AM Viewed order from rn order list

## 2012-02-07 ENCOUNTER — Encounter (HOSPITAL_COMMUNITY): Payer: Self-pay | Admitting: Orthopedic Surgery

## 2012-02-07 LAB — CBC
Hemoglobin: 10.8 g/dL — ABNORMAL LOW (ref 12.0–15.0)
MCH: 28.6 pg (ref 26.0–34.0)
MCHC: 33 g/dL (ref 30.0–36.0)
MCV: 86.5 fL (ref 78.0–100.0)
Platelets: 198 10*3/uL (ref 150–400)

## 2012-02-07 LAB — BASIC METABOLIC PANEL
Calcium: 8.6 mg/dL (ref 8.4–10.5)
Creatinine, Ser: 0.8 mg/dL (ref 0.50–1.10)
GFR calc non Af Amer: 83 mL/min — ABNORMAL LOW (ref >90)
Glucose, Bld: 170 mg/dL — ABNORMAL HIGH (ref 70–99)
Sodium: 135 mEq/L (ref 135–145)

## 2012-02-07 LAB — GLUCOSE, CAPILLARY
Glucose-Capillary: 175 mg/dL — ABNORMAL HIGH (ref 70–99)
Glucose-Capillary: 168 mg/dL — ABNORMAL HIGH (ref 70–99)
Glucose-Capillary: 136 mg/dL — ABNORMAL HIGH (ref 70–99)
Glucose-Capillary: 128 mg/dL — ABNORMAL HIGH (ref 70–99)

## 2012-02-07 LAB — PROTIME-INR: Prothrombin Time: 13.5 seconds (ref 11.6–15.2)

## 2012-02-07 MED ORDER — WARFARIN SODIUM 7.5 MG PO TABS
7.5000 mg | ORAL_TABLET | Freq: Once | ORAL | Status: AC
  Administered 2012-02-07: 7.5 mg via ORAL
  Filled 2012-02-07: qty 1

## 2012-02-07 NOTE — Progress Notes (Signed)
Subjective: C/o itching. Denies cp, sob.    Objective: Vital signs in last 24 hours: Temp:  [98.5 F (36.9 C)-100.4 F (38 C)] 98.5 F (36.9 C) (05/30 0533) Pulse Rate:  [87-98] 94  (05/30 0533) Resp:  [16-18] 16  (05/30 0543) BP: (102-118)/(44-70) 108/47 mmHg (05/30 0533) SpO2:  [92 %-99 %] 95 % (05/30 0543) Weight:  [102.967 kg (227 lb)] 102.967 kg (227 lb) (05/29 1319)  Intake/Output from previous day: 05/29 0701 - 05/30 0700 In: 1040 [P.O.:240; I.V.:800] Out: 1425 [Urine:1275; Drains:150] Intake/Output this shift: Total I/O In: -  Out: 125 [Urine:125]   Basename 02/07/12 0607  HGB 10.8*    Basename 02/07/12 0607  WBC 8.4  RBC 3.78*  HCT 32.7*  PLT 198    Basename 02/07/12 0607  NA 135  K 3.9  CL 97  CO2 29  BUN 10  CREATININE 0.80  GLUCOSE 170*  CALCIUM 8.6    Basename 02/07/12 0607  LABPT --  INR 1.01    Exam:  Dressing c/d/i.  Calf nt, nvi.    Assessment/Plan: D/c pca due to itching. Patient advised to list this as a true allergic reaction.  Anticipate d/c home fri or sat.     Kayla [name] M 02/07/2012, 1:07 PM

## 2012-02-07 NOTE — Progress Notes (Signed)
UR COMPLETED  

## 2012-02-07 NOTE — Evaluation (Signed)
Occupational Therapy Evaluation Patient Details Name: Kayla [name] MRN: [id number] DOB: 01-21-1961 Today's Date: 02/07/2012 Time: 4098-1191 OT Time Calculation (min): 30 min  OT Assessment / Plan / Recommendation Clinical Impression  Pt s/p right TKA and presents with generalized weakness, pain, and overall decreased independence with ADLs. Pt will benefit from skilled OT in the acute setting to maximize I with ADL and ADL mobility prior to d/c home with family    OT Assessment  Patient needs continued OT Services    Follow Up Recommendations  No OT follow up;Supervision - Intermittent    Barriers to Discharge      Equipment Recommendations  None recommended by OT    Recommendations for Other Services    Frequency  Min 2X/week    Precautions / Restrictions Precautions Precautions: Knee Precaution Booklet Issued: No Required Braces or Orthoses: Knee Immobilizer - Right Knee Immobilizer - Right: On except when in CPM Restrictions Weight Bearing Restrictions: Yes RLE Weight Bearing: Weight bearing as tolerated   Pertinent Vitals/Pain C/o 6/10 pain; RN notified    ADL  Grooming: Performed;Wash/dry face;Brushing hair;Teeth care;Min guard Where Assessed - Grooming: Supported standing Lower Body Dressing: Simulated;Moderate assistance Where Assessed - Lower Body Dressing: Sopported sit to stand Toilet Transfer: Simulated;Min guard Toilet Transfer Method: Sit to stand Toileting - Clothing Manipulation and Hygiene: Simulated;Minimal assistance Where Assessed - Engineer, mining and Hygiene: Standing Equipment Used: Knee Immobilizer;Rolling walker Transfers/Ambulation Related to ADLs: Min guard A with RW ambulation. VC for sequencing, especially for backing up ADL Comments: Expect good progress    OT Diagnosis: Generalized weakness;Acute pain  OT Problem List: Decreased activity tolerance;Impaired balance (sitting and/or standing);Decreased knowledge of  use of DME or AE;Decreased knowledge of precautions;Pain;Increased edema OT Treatment Interventions: Self-care/ADL training;Therapeutic activities;Patient/family education;Balance training;DME and/or AE instruction   OT Goals Acute Rehab OT Goals OT Goal Formulation: With patient Time For Goal Achievement: 02/14/12 Potential to Achieve Goals: Good ADL Goals Pt Will Perform Grooming: Standing at sink;with modified independence ADL Goal: Grooming - Progress: Goal set today Pt Will Perform Lower Body Bathing: with set-up;with supervision;Sit to stand in shower ADL Goal: Lower Body Bathing - Progress: Goal set today Pt Will Perform Lower Body Dressing: with set-up;with supervision;Sit to stand from bed;Sit to stand from chair ADL Goal: Lower Body Dressing - Progress: Goal set today Pt Will Transfer to Toilet: with modified independence;Ambulation;3-in-1 ADL Goal: Toilet Transfer - Progress: Goal set today Pt Will Perform Toileting - Clothing Manipulation: with modified independence;Standing ADL Goal: Toileting - Clothing Manipulation - Progress: Goal set today Pt Will Perform Tub/Shower Transfer: Shower transfer;with supervision;Ambulation;with DME (backward entry) ADL Goal: Tub/Shower Transfer - Progress: Goal set today  Visit Information  Last OT Received On: 02/07/12 Assistance Needed: +1    Subjective Data  Subjective: I don't want anymore morphine Patient Stated Goal: Return home with family   Prior Functioning  Home Living Lives With: Spouse;Son (2.5 yo daughter in daycare during day) Available Help at Discharge: Family;Available PRN/intermittently (husband and son work 3rd shift, older dtr to stay til 6/4) Type of Home: House Home Access: Stairs to enter [employer] of Steps: 3-4 Entrance Stairs-Rails: Can reach both Home Layout: One level Bathroom Shower/Tub: Walk-in shower;Door [employer] Toilet: Standard Bathroom Accessibility: Yes How Accessible: Accessible  via walker Home Adaptive Equipment: Bedside commode/3-in-1;Walker - rolling Prior Function Level of Independence: Independent Able to Take Stairs?: Yes Driving: Yes Vocation: Full time employment Comments: job involves driving and walking Communication Communication: No difficulties Dominant Hand:  Right    Cognition  Overall Cognitive Status: Appears within functional limits for tasks assessed/performed Arousal/Alertness: Awake/alert Orientation Level: Oriented X4 / Intact Behavior During Session: [facility] for tasks performed    Extremity/Trunk Assessment Right Upper Extremity Assessment RUE ROM/Strength/Tone: Within functional levels RUE Sensation: WFL - Light Touch RUE Coordination: WFL - gross/fine motor Left Upper Extremity Assessment LUE ROM/Strength/Tone: Within functional levels LUE Sensation: WFL - Light Touch LUE Coordination: WFL - gross/fine motor Right Lower Extremity Assessment RLE ROM/Strength/Tone: Deficits;Due to pain;Due to precautions RLE ROM/Strength/Tone Deficits: initiates quad set, achieved 60 deg active R knee flex Left Lower Extremity Assessment LLE ROM/Strength/Tone: Within functional levels Trunk Assessment Trunk Assessment: Normal   Mobility Bed Mobility Bed Mobility: Not assessed Supine to Sit: 4: Min guard;HOB flat Details for Bed Mobility Assistance: v/c's for long sit technique, pt able to manage R LE I'ly Transfers Sit to Stand: 4: Min assist;With armrests;From chair/3-in-1 Stand to Sit: 4: Min assist;With armrests;To chair/3-in-1 Details for Transfer Assistance: v/c's for hand placement and technique, minA initially due to R knee buckling upon standing   Exercise Total Joint Exercises Ankle Circles/Pumps: AROM;Both;10 reps;Supine Quad Sets: AROM;Right;10 reps;Supine Heel Slides: AAROM;Right;10 reps;Supine Goniometric ROM: 60 R aa knee flex  Balance    End of Session OT - End of Session Equipment Utilized During Treatment: Gait belt;Right  knee immobilizer Activity Tolerance: Patient tolerated treatment well Patient left: in chair;with call bell/phone within reach;with family/visitor present Nurse Communication: Mobility status CPM Right Knee CPM Right Knee: Off   Hiroki Wint 02/07/2012, 9:55 AM

## 2012-02-07 NOTE — Progress Notes (Addendum)
Physical Therapy Evaluation Note  Past Medical History  Diagnosis Date  . Depressive disorder, not elsewhere classified   . Other and unspecified hyperlipidemia   . Unspecified essential hypertension   . Osteoarthritis   . Mitral valve prolapse   . Sleep apnea   . Diabetes mellitus    Past Surgical History  Procedure Date  . Rotator cuff repair   . Vesicovaginal fistula closure w/ tah   . Eye surgery     lasik bilateral   . Knee arthroscopy w/ meniscal repair   . Abdominal hysterectomy      02/07/12 0743  PT Visit Information  Last PT Received On 02/07/12  Assistance Needed +1  PT Time Calculation  PT Start Time 0743  PT Stop Time 0808  PT Time Calculation (min) 25 min  Subjective Data  Subjective Pt received supine in bed with report of 0/10 R knee pain. Patient c/o "I'm itching like crazy."  Precautions  Precautions Knee  Precaution Booklet Issued No  Required Braces or Orthoses Knee Immobilizer - Right  Knee Immobilizer - Right On except when in CPM  Restrictions  Weight Bearing Restrictions Yes  RLE Weight Bearing WBAT  Home Living  Lives With Spouse;Son (doe have 2.5 yo daughter who will be in daycare during the day)  Available Help at Discharge Family;Available PRN/intermittently (husband and son work 3rd shift, older dtr to stay til 6/4)  Type of Home House  Home Access Stairs to enter  Entrance Stairs-Number of Steps 3-4  Entrance Stairs-Rails Can reach both  Home Layout One level  [employer];Door  Horticulturist, commercial Yes  How Accessible Accessible via walker  Home Adaptive Equipment Bedside commode/3-in-1;Walker - rolling  Prior Function  Level of Independence Independent  Able to Take Stairs? Yes  Driving Yes  Vocation Full time employment  Comments job involves driving and walking  Communication  Communication No difficulties  Cognition  Overall Cognitive Status Appears within functional  limits for tasks assessed/performed  Arousal/Alertness Awake/alert  Orientation Level Oriented X4 / Intact  Behavior During Session [facility] for tasks performed  Right Upper Extremity Assessment  RUE ROM/Strength/Tone WFL  Left Upper Extremity Assessment  LUE ROM/Strength/Tone WFL  Right Lower Extremity Assessment  RLE ROM/Strength/Tone Deficits;Due to pain;Due to precautions  RLE ROM/Strength/Tone Deficits initiates quad set, achieved 60 deg active R knee flex  Left Lower Extremity Assessment  LLE ROM/Strength/Tone WFL  Trunk Assessment  Trunk Assessment Normal  Bed Mobility  Bed Mobility Supine to Sit  Supine to Sit 4: Min guard;HOB flat  Details for Bed Mobility Assistance v/c's for long sit technique, pt able to manage R LE I'ly  Transfers  Transfers Sit to Stand;Stand to Sit  Sit to Stand 4: Min assist;With armrests;From bed  Stand to Sit 4: Min assist;With armrests;To chair/3-in-1  Details for Transfer Assistance v/c's for hand placement and technique, minA initially due to R knee buckling upon standing  Ambulation/Gait  Ambulation/Gait Assistance 4: Min assist  Ambulation Distance (Feet) 30 Feet  Assistive device Rolling walker  Ambulation/Gait Assistance Details v/c's for sequencing and to maintain R quad set when advancing L LE to minimize R knee buckling  Gait Pattern Step-to pattern;Decreased step length - right;Decreased stance time - right;Antalgic  Gait velocity slow  Stairs No  Exercises  Exercises Total Joint (provided handout for HEP)  Total Joint Exercises  Ankle Circles/Pumps AROM;Both;10 reps;Supine  Quad Sets AROM;Right;10 reps;Supine  Heel Slides AAROM;Right;10 reps;Supine  Goniometric ROM  60 R aa knee flex  PT - End of Session  Equipment Utilized During Treatment Gait belt;Right knee immobilizer  Activity Tolerance Patient limited by pain;Patient limited by fatigue  Patient left in chair;with call bell/phone within reach  Nurse Communication Mobility  status  CPM Right Knee  CPM Right Knee Off  PT Assessment  Clinical Impression Statement Pt s/p R TKA presenting with minimal R knee pain, decreased R LE strength, and R knee active ROM. Patient motivated to return home and tolerating PT well. Patient demonstrates good potential to be able to return home upon d/c from hospital  PT Recommendation/Assessment Patient needs continued PT services  PT Problem List Decreased strength;Decreased range of motion;Decreased activity tolerance;Decreased balance  Barriers to Discharge Decreased caregiver support  PT Therapy Diagnosis  Difficulty walking;Abnormality of gait;Generalized weakness;Acute pain  PT Plan  PT Frequency 7X/week  PT Treatment/Interventions DME instruction;Stair training;Functional mobility training;Gait training;Therapeutic activities;Therapeutic exercise  PT Recommendation  Follow Up Recommendations Home health PT;Supervision/Assistance - 24 hour  Equipment Recommended None recommended by PT (pt has RW and BSC)  Individuals Consulted  Consulted and Agree with Results and Recommendations Patient  Acute Rehab PT Goals  PT Goal Formulation With patient  Time For Goal Achievement 02/14/12  Potential to Achieve Goals Good  Pt will go Supine/Side to Sit with modified independence;with HOB 0 degrees  PT Goal: Supine/Side to Sit - Progress Goal set today  Pt will go Sit to Supine/Side with modified independence;with HOB 0 degrees  PT Goal: Sit to Supine/Side - Progress Goal set today  Pt will go Sit to Stand with modified independence;with upper extremity assist (up to RW)  PT Goal: Sit to Stand - Progress Goal set today  Pt will Ambulate >150 feet;with modified independence;with rolling walker  PT Goal: Ambulate - Progress Goal set today  Pt will Go Up / Down Stairs with supervision;3-5 stairs;with rail(s)  PT Goal: Up/Down Stairs - Progress Goal set today  Pt will Perform Home Exercise Program Independently  PT Goal: Perform  Home Exercise Program - Progress Goal set today  Written Expression  Dominant Hand Right     Pain: pt initially at 0/10 R knee pain, pt with increased R knee pain s/p PT but did not rate  Lewis Shock, PT, DPT Pager #: [phone] Office #: [phone]

## 2012-02-07 NOTE — Consult Note (Signed)
ANTICOAGULATION CONSULT NOTE - Follow Up Consult  Pharmacy Consult for Coumadin with Lovenox bridging Indication: VTE prophylaxis following R-TKA  No Known Allergies  Patient Measurements: Height: 5\' 3"  (160 cm) Weight: 227 lb (102.967 kg) IBW/kg (Calculated) : 52.4    Vital Signs: Temp: 98.5 F (36.9 C) (05/30 0533) BP: 108/47 mmHg (05/30 0533) Pulse Rate: 94  (05/30 0533)  Labs:  Basename 02/07/12 0607  HGB 10.8*  HCT 32.7*  PLT 198  APTT --  LABPROT 13.5  INR 1.01  HEPARINUNFRC --  CREATININE 0.80  CKTOTAL --  CKMB --  TROPONINI --   Estimated Creatinine Clearance: 93.2 ml/min (by C-G formula based on Cr of 0.8).   Medications:  Prescriptions prior to admission  Medication Sig Dispense Refill  . atorvastatin (LIPITOR) 20 MG tablet Take 20 mg by mouth daily.      . clonazePAM (KLONOPIN) 1 MG tablet Take 0.5-1 mg by mouth at bedtime as needed.      Marland Kitchen lisinopril-hydrochlorothiazide (PRINZIDE,ZESTORETIC) 20-25 MG per tablet Take 1 tablet by mouth daily.  90 tablet  6  . LORazepam (ATIVAN) 0.5 MG tablet Take 0.5 mg by mouth 2 (two) times daily as needed. For anxiety      . meloxicam (MOBIC) 15 MG tablet Take 1 tablet (15 mg total) by mouth daily.  90 tablet  6  . metFORMIN (GLUCOPHAGE XR) 500 MG 24 hr tablet Take 1 tablet (500 mg total) by mouth daily with breakfast.  180 tablet  11  . oxybutynin (DITROPAN XL) 15 MG 24 hr tablet Take 1 tablet (15 mg total) by mouth daily.  90 tablet  6  . sertraline (ZOLOFT) 100 MG tablet Take 1 tablet (100 mg total) by mouth daily.  90 tablet  6  . zolpidem (AMBIEN) 10 MG tablet Take 10 mg by mouth at bedtime as needed. For sleep       Scheduled:    .  ceFAZolin (ANCEF) IV  1 g Intravenous Q8H  . coumadin book  1 each Does not apply Once  . docusate sodium  100 mg Oral BID  . enoxaparin  30 mg Subcutaneous Q12H  . insulin aspart  0-15 Units Subcutaneous TID WC  . metFORMIN  500 mg Oral Q breakfast  . methocarbamol (ROBAXIN) IV   500 mg Intravenous To PACU  . oxybutynin  15 mg Oral Daily  . sertraline  100 mg Oral Daily  . simvastatin  40 mg Oral q1800  . warfarin  7.5 mg Oral ONCE-1800  . warfarin  1 each Does not apply Once  . Warfarin - Pharmacist Dosing Inpatient   Does not apply q1800  . DISCONTD:  ceFAZolin (ANCEF) IV  2 g Intravenous 60 min Pre-Op  . DISCONTD: morphine   Intravenous Q4H   Anti-infectives     Start     Dose/Rate Route Frequency Ordered Stop   02/06/12 1600   ceFAZolin (ANCEF) IVPB 1 g/50 mL premix        1 g 100 mL/hr over 30 Minutes Intravenous Every 8 hours 02/06/12 1314 02/07/12 0830   02/05/12 1432   ceFAZolin (ANCEF) IVPB 2 g/50 mL premix  Status:  Discontinued        2 g 100 mL/hr over 30 Minutes Intravenous 60 min pre-op 02/05/12 1432 02/06/12 1144   02/05/12 1432   ceFAZolin (ANCEF) IVPB 2 g/50 mL premix        2 g 100 mL/hr over 30 Minutes Intravenous 60 min pre-op 02/05/12  1432 02/06/12 0840          Assessment:  POD # 1 s/p R-TKA on Coumadin with Lovenox bridging for VTE prophylaxis  INR 1.01 WBC/Hgb/Hct/Plts:  8.4/10.8/32.7/198 (05/30 7829)  Renal function stable  No evidence of bleeding  Goal of Therapy:   INR 2-3   Plan:   Repeat Coumadin 7.5 mg today.  Continue Lovenox until INR therapeutic.  Rabecka Brendel, Elisha Headland, Pharm.D. 02/07/2012 10:53 AM

## 2012-02-07 NOTE — Progress Notes (Signed)
Physical Therapy Treatment Note   02/07/12 1450  PT Visit Information  Last PT Received On 02/07/12  Assistance Needed +1  PT Time Calculation  PT Start Time 1450  PT Stop Time 1522  PT Time Calculation (min) 32 min  Subjective Data  Subjective Pt received supine in bed in CPM 0-60 with request for pain meds. RN provided. pt pain at 6/10. Patient with report "I haven't been able to pee since they took my catheter out."  Precautions  Precautions Knee  Required Braces or Orthoses Knee Immobilizer - Right  Knee Immobilizer - Right On except when in CPM  Restrictions  Weight Bearing Restrictions Yes  RLE Weight Bearing WBAT  Cognition  Overall Cognitive Status Appears within functional limits for tasks assessed/performed  Arousal/Alertness Awake/alert  Orientation Level Oriented X4 / Intact  Behavior During Session [facility] for tasks performed  Bed Mobility  Bed Mobility Supine to Sit  Supine to Sit 4: Min guard;HOB flat  Transfers  Sit to Stand 4: Min assist;With armrests;From chair/3-in-1  Stand to Sit 4: Min assist;With armrests;To chair/3-in-1  Details for Transfer Assistance v/c's for hand placement  Ambulation/Gait  Ambulation/Gait Assistance 4: Min assist  Ambulation Distance (Feet) 65 Feet  Assistive device Rolling walker  Ambulation/Gait Assistance Details used R KI, v/c's to increased R LE WBing  Gait Pattern Step-to pattern;Decreased step length - right;Decreased stance time - right;Antalgic  Gait velocity improved since yesterday  Total Joint Exercises  Quad Sets AROM;Right;10 reps;Supine  Heel Slides AAROM;Right;10 reps;Supine  PT - End of Session  Equipment Utilized During Treatment Gait belt;Right knee immobilizer  Activity Tolerance Patient tolerated treatment well  Patient left in chair;with call bell/phone within reach  Nurse Communication (pt unable to pee)  PT - Assessment/Plan  Comments on Treatment Session Pt assist to commode x 2 but pt unable to urinate  either time. Patient with significant improved with transfer and ambulation. will trial stairs tomrorow AM prior to d/c.  PT Plan Discharge plan remains appropriate;Frequency remains appropriate  PT Frequency 7X/week  Follow Up Recommendations Home health PT;Supervision/Assistance - 24 hour  Equipment Recommended None recommended by PT  Acute Rehab PT Goals  Time For Goal Achievement 02/14/12  Potential to Achieve Goals Good  PT Goal: Supine/Side to Sit - Progress Progressing toward goal  PT Goal: Sit to Stand - Progress Progressing toward goal  PT Goal: Ambulate - Progress Progressing toward goal  PT Goal: Perform Home Exercise Program - Progress Progressing toward goal     Pain: 6/10 R knee pain, RN provided pain meds  Lewis Shock, PT, DPT Pager #: [phone] Office #: [phone]

## 2012-02-07 NOTE — Progress Notes (Signed)
Occupational Therapy Treatment Patient Details Name: Kayla [name] MRN: [id number] DOB: June 03, 1961 Today's Date: 02/07/2012 Time: 7829-5621 OT Time Calculation (min): 11 min  OT Assessment / Plan / Recommendation Comments on Treatment Session pt doing well despite pain    Follow Up Recommendations  No OT follow up;Supervision - Intermittent    Barriers to Discharge       Equipment Recommendations  None recommended by OT    Recommendations for Other Services    Frequency Min 2X/week   Plan Discharge plan remains appropriate    Precautions / Restrictions Precautions Precautions: Knee Precaution Booklet Issued: No Required Braces or Orthoses: Knee Immobilizer - Right Knee Immobilizer - Right: On except when in CPM Restrictions Weight Bearing Restrictions: Yes RLE Weight Bearing: Weight bearing as tolerated   Pertinent Vitals/Pain 6/10 pain; RN provided medication    ADL  Grooming: Performed;Wash/dry face;Brushing hair;Teeth care;Min guard Where Assessed - Grooming: Supported standing Lower Body Dressing: Simulated;Moderate assistance Where Assessed - Lower Body Dressing: Sopported sit to stand Toilet Transfer: Simulated;Min guard Toilet Transfer Method: Sit to stand Toileting - Clothing Manipulation and Hygiene: Simulated;Minimal assistance Where Assessed - Engineer, mining and Hygiene: Standing Tub/Shower Transfer: Simulated;Minimal assistance Tub/Shower Transfer Method: Ambulating (backward entry/forward exit) Psychologist, educational: Walk in shower Equipment Used: Knee Immobilizer;Rolling walker Transfers/Ambulation Related to ADLs: educated pt on walk-in shower entry/exit. husband present. pt able to verbalize sequence, but still requiring cues to execute.  ADL Comments: Educated pt on various shower DME. Pt states she will try putting her 3n1 in shower, if it does not fit, pt and husband able to verbalize where to acquire a different shower  seat    OT Diagnosis: Generalized weakness;Acute pain  OT Problem List: Decreased activity tolerance;Impaired balance (sitting and/or standing);Decreased knowledge of use of DME or AE;Decreased knowledge of precautions;Pain;Increased edema OT Treatment Interventions: Self-care/ADL training;Therapeutic activities;Patient/family education;Balance training;DME and/or AE instruction   OT Goals Acute Rehab OT Goals OT Goal Formulation: With patient Time For Goal Achievement: 02/14/12 Potential to Achieve Goals: Good ADL Goals Pt Will Perform Grooming: Standing at sink;with modified independence ADL Goal: Grooming - Progress: Goal set today Pt Will Perform Lower Body Bathing: with set-up;with supervision;Sit to stand in shower ADL Goal: Lower Body Bathing - Progress: Goal set today Pt Will Perform Lower Body Dressing: with set-up;with supervision;Sit to stand from bed;Sit to stand from chair ADL Goal: Lower Body Dressing - Progress: Goal set today Pt Will Transfer to Toilet: with modified independence;Ambulation;3-in-1 ADL Goal: Toilet Transfer - Progress: Goal set today Pt Will Perform Toileting - Clothing Manipulation: with modified independence;Standing ADL Goal: Toileting - Clothing Manipulation - Progress: Goal set today Pt Will Perform Tub/Shower Transfer: Shower transfer;with supervision;Ambulation;with DME (backward entry) ADL Goal: Tub/Shower Transfer - Progress: Progressing toward goals  Visit Information  Last OT Received On: 02/07/12 Assistance Needed: +1    Subjective Data  Subjective: I don't want anymore morphine Patient Stated Goal: Return home with family   Prior Functioning  Home Living Lives With: Spouse;Son (2.5 yo daughter in daycare during day) Available Help at Discharge: Family;Available PRN/intermittently (husband and son work 3rd shift, older dtr to stay til 6/4) Type of Home: House Home Access: Stairs to enter [employer] of Steps: 3-4 Entrance  Stairs-Rails: Can reach both Home Layout: One level Bathroom Shower/Tub: Walk-in shower;Door [employer] Toilet: Standard Bathroom Accessibility: Yes How Accessible: Accessible via walker Home Adaptive Equipment: Bedside commode/3-in-1;Walker - rolling Prior Function Level of Independence: Independent Able to Take Stairs?: Yes Driving:  Yes Vocation: Full time employment Comments: job involves driving and walking Communication Communication: No difficulties Dominant Hand: Right    Cognition  Overall Cognitive Status: Appears within functional limits for tasks assessed/performed Arousal/Alertness: Awake/alert Orientation Level: Oriented X4 / Intact Behavior During Session: WFL for tasks performed    End of Session OT - End of Session Equipment Utilized During Treatment: Gait belt;Right knee immobilizer Activity Tolerance: Patient tolerated treatment well Patient left: in chair;with call bell/phone within reach;with family/visitor present Nurse Communication: Mobility status CPM Right Knee CPM Right Knee: Off   Kayla [name] 02/07/2012, 10:00 AM

## 2012-02-07 NOTE — Care Management Note (Signed)
    Page 1 of 1   02/07/2012     11:53:50 AM   CARE MANAGEMENT NOTE 02/07/2012  Patient:  Kayla [name], Kayla [name]   Account Number:  [id number]  Date Initiated:  02/06/2012  Documentation initiated by:  Vance Peper  Subjective/Objective Assessment:   51 yr old female s/p right total knee arthroplasty     Action/Plan:   will f/u pt fresh postop   Anticipated DC Date:     Anticipated DC Plan:  HOME W HOME HEALTH SERVICES      DC Planning Services  CM consult      Choice offered to / List presented to:             Status of service:  In process, will continue to follow Medicare Important Message given?  NO (If response is "NO", the following Medicare IM given date fields will be blank) Date Medicare IM given:   Date Additional Medicare IM given:    Discharge Disposition:    Per UR Regulation:  Reviewed for med. necessity/level of care/duration of stay  If discussed at Long Length of Stay Meetings, dates discussed:    Comments:  02/07/12  11:51  Anette Guarneri RN/CM Spoke with patient, she has DME at home, MD office has set up HHPT w/[facility], per [name] her care givers will be her husband and daughter No  other CM needs identified at this time

## 2012-02-08 LAB — BASIC METABOLIC PANEL
BUN: 9 mg/dL (ref 6–23)
CO2: 28 mEq/L (ref 19–32)
Calcium: 8.7 mg/dL (ref 8.4–10.5)
Chloride: 103 mEq/L (ref 96–112)
Creatinine, Ser: 0.66 mg/dL (ref 0.50–1.10)
Glucose, Bld: 119 mg/dL — ABNORMAL HIGH (ref 70–99)

## 2012-02-08 LAB — CBC
HCT: 28.2 % — ABNORMAL LOW (ref 36.0–46.0)
MCH: 29.3 pg (ref 26.0–34.0)
MCV: 86 fL (ref 78.0–100.0)
Platelets: 156 10*3/uL (ref 150–400)
RBC: 3.28 MIL/uL — ABNORMAL LOW (ref 3.87–5.11)

## 2012-02-08 LAB — GLUCOSE, CAPILLARY

## 2012-02-08 MED ORDER — METHOCARBAMOL 500 MG PO TABS: 500.0000 mg | ORAL_TABLET | Freq: Four times a day (QID) | ORAL | Status: AC

## 2012-02-08 MED ORDER — BISACODYL 10 MG RE SUPP
10.0000 mg | Freq: Once | RECTAL | Status: AC
  Administered 2012-02-08: 10 mg via RECTAL
  Filled 2012-02-08: qty 1

## 2012-02-08 MED ORDER — OXYCODONE-ACETAMINOPHEN 7.5-325 MG PO TABS: 1.0000 | ORAL_TABLET | ORAL | Status: AC | PRN

## 2012-02-08 MED ORDER — ENOXAPARIN SODIUM 30 MG/0.3ML ~~LOC~~ SOLN: 30.0000 mg | Freq: Two times a day (BID) | SUBCUTANEOUS | Status: DC

## 2012-02-08 MED ORDER — WARFARIN SODIUM 5 MG PO TABS: 5.0000 mg | ORAL_TABLET | Freq: Every day | ORAL | Status: DC

## 2012-02-08 MED ORDER — WARFARIN SODIUM 5 MG PO TABS
5.0000 mg | ORAL_TABLET | Freq: Once | ORAL | Status: DC
  Filled 2012-02-08: qty 1

## 2012-02-08 NOTE — Consult Note (Signed)
ANTICOAGULATION CONSULT NOTE - Follow Up Consult  Pharmacy Consult for Coumadin with Lovenox bridging Indication: VTE prophylaxis following R-TKA  No Known Allergies  Patient Measurements: Height: 5\' 3"  (160 cm) Weight: 227 lb (102.967 kg) IBW/kg (Calculated) : 52.4    Vital Signs: Temp: 98.2 F (36.8 C) (05/31 0519) BP: 104/63 mmHg (05/31 0519) Pulse Rate: 82  (05/31 0519)  Labs:  Basename 02/08/12 0547 02/07/12 0607  HGB 9.6* 10.8*  HCT 28.2* 32.7*  PLT 156 198  APTT -- --  LABPROT 17.9* 13.5  INR 1.45 1.01  HEPARINUNFRC -- --  CREATININE 0.66 0.80  CKTOTAL -- --  CKMB -- --  TROPONINI -- --   Estimated Creatinine Clearance: 93.2 ml/min (by C-G formula based on Cr of 0.66).   Medications:  Prescriptions prior to admission  Medication Sig Dispense Refill  . atorvastatin (LIPITOR) 20 MG tablet Take 20 mg by mouth daily.      . clonazePAM (KLONOPIN) 1 MG tablet Take 0.5-1 mg by mouth at bedtime as needed.      Marland Kitchen lisinopril-hydrochlorothiazide (PRINZIDE,ZESTORETIC) 20-25 MG per tablet Take 1 tablet by mouth daily.  90 tablet  6  . LORazepam (ATIVAN) 0.5 MG tablet Take 0.5 mg by mouth 2 (two) times daily as needed. For anxiety      . meloxicam (MOBIC) 15 MG tablet Take 1 tablet (15 mg total) by mouth daily.  90 tablet  6  . metFORMIN (GLUCOPHAGE XR) 500 MG 24 hr tablet Take 1 tablet (500 mg total) by mouth daily with breakfast.  180 tablet  11  . oxybutynin (DITROPAN XL) 15 MG 24 hr tablet Take 1 tablet (15 mg total) by mouth daily.  90 tablet  6  . sertraline (ZOLOFT) 100 MG tablet Take 1 tablet (100 mg total) by mouth daily.  90 tablet  6  . zolpidem (AMBIEN) 10 MG tablet Take 10 mg by mouth at bedtime as needed. For sleep       Scheduled:     . docusate sodium  100 mg Oral BID  . enoxaparin  30 mg Subcutaneous Q12H  . insulin aspart  0-15 Units Subcutaneous TID WC  . metFORMIN  500 mg Oral Q breakfast  . oxybutynin  15 mg Oral Daily  . sertraline  100 mg Oral  Daily  . simvastatin  40 mg Oral q1800  . warfarin  7.5 mg Oral ONCE-1800  . warfarin  1 each Does not apply Once  . Warfarin - Pharmacist Dosing Inpatient   Does not apply q1800   Anti-infectives     Start     Dose/Rate Route Frequency Ordered Stop   02/06/12 1600   ceFAZolin (ANCEF) IVPB 1 g/50 mL premix        1 g 100 mL/hr over 30 Minutes Intravenous Every 8 hours 02/06/12 1314 02/07/12 0830   02/05/12 1432   ceFAZolin (ANCEF) IVPB 2 g/50 mL premix  Status:  Discontinued        2 g 100 mL/hr over 30 Minutes Intravenous 60 min pre-op 02/05/12 1432 02/06/12 1144   02/05/12 1432   ceFAZolin (ANCEF) IVPB 2 g/50 mL premix        2 g 100 mL/hr over 30 Minutes Intravenous 60 min pre-op 02/05/12 1432 02/06/12 0840          Assessment: s/p R-TKA on Coumadin with Lovenox bridging for VTE prophylaxis. Baseline INR 1.01. Coumadin score = 6.  Anticoag: INR up to 1.45. Hgb donw to 9.6 post-op.  Continue Lovenox 30mg /12h until INR>=2.  CV: HTN: VSS  ENDO: CBGs 128-175 on SSI and metformin  Neuro: Zoloft for depression  GI/NUTR: HLD, BMI > 40, Zocor   Goal of Therapy:   INR 2-3   Plan:   Decrease Coumadin to 5mg  today  Continue Lovenox until INR therapeutic.  Morphine added to allergy profile  Merilynn Finland, [employer], [address].D. 02/08/2012 9:33 AM

## 2012-02-08 NOTE — Progress Notes (Addendum)
Physical Therapy Treatment Note   02/08/12 0804  PT Visit Information  Last PT Received On 02/08/12  Assistance Needed +1  PT Time Calculation  PT Start Time 1610-9604  31 min  Subjective Data  Subjective Pt received supine in bed with request to use commode. Patient reports "I'm still having hard time peeing." Pt with also reports increased pain this date due "the knee woke up."  Precautions  Precautions Knee  Required Braces or Orthoses Knee Immobilizer - Right  Restrictions  RLE Weight Bearing WBAT  Cognition  Overall Cognitive Status Appears within functional limits for tasks assessed/performed  Arousal/Alertness Awake/alert  Orientation Level Oriented X4 / Intact  Behavior During Session [facility] for tasks performed  Bed Mobility  Bed Mobility Supine to Sit  Supine to Sit 5: Supervision;HOB flat  Transfers  Sit to Stand 5: Supervision;Without upper extremity assist;From bed  Stand to Sit 5: Supervision;With upper extremity assist;To chair/3-in-1  Ambulation/Gait  Ambulation/Gait Assistance 5: Supervision  Ambulation Distance (Feet) 100 Feet  Assistive device Rolling walker  Ambulation/Gait Assistance Details used RW with R KI  Gait Pattern Step-to pattern;Decreased step length - right;Decreased stance time - right;Antalgic  Gait velocity slow  General Gait Details improved R LE WBing  Stairs Yes  Stairs Assistance 4: Min guard  Stair Management Technique Two rails;Forwards  Number of Stairs 2   Balance  Balance Assessed Yes  Total Joint Exercises  Quad Sets AROM;Right;10 reps;Supine  Heel Slides AAROM;Right;10 reps;Seated  Long Arc Quad AROM;Right;10 reps;Seated  PT - End of Session  Equipment Utilized During Treatment Gait belt;Right knee immobilizer  Activity Tolerance Patient tolerated treatment well  Patient left in chair;with call bell/phone within reach  Nurse Communication Mobility status  PT - Assessment/Plan  Comments on Treatment Session Pt assisted to  commode with supervision however still having difficulty with urinating. Pt with improved transfer and ambulation mobility. Patient safe to d/c home today or when approved by MD.  PT Plan Discharge plan remains appropriate;Frequency remains appropriate  PT Frequency 7X/week  Follow Up Recommendations Home health PT;Supervision/Assistance - 24 hour  Acute Rehab PT Goals  Time For Goal Achievement 02/14/12  Potential to Achieve Goals Good  PT Goal: Supine/Side to Sit - Progress Progressing toward goal  PT Goal: Sit to Supine/Side - Progress Progressing toward goal  PT Goal: Sit to Stand - Progress Progressing toward goal  PT Goal: Ambulate - Progress Progressing toward goal  PT Goal: Up/Down Stairs - Progress Progressing toward goal  PT Goal: Perform Home Exercise Program - Progress Progressing toward goal    Pain: 7/10 R knee pain.  Lewis Shock, PT, DPT Pager #: [phone] Office #: [phone]

## 2012-02-08 NOTE — Progress Notes (Signed)
Subjective: Doing well.  Pain controlled.  No bowel movement yet.  States she is voiding but slow starting.  hx bladder spasms.  Denies abd pain, dysuria.     Objective: Vital signs in last 24 hours: Temp:  [98.2 F (36.8 C)-98.7 F (37.1 C)] 98.2 F (36.8 C) (05/31 0519) Pulse Rate:  [82-96] 82  (05/31 0519) Resp:  [16-20] 20  (05/31 0519) BP: (104-115)/(63-71) 104/63 mmHg (05/31 0519) SpO2:  [90 %-97 %] 90 % (05/31 0519)  Intake/Output from previous day: 05/30 0701 - 05/31 0700 In: 1010 [P.O.:360; I.V.:650] Out: 205 [Urine:125; Drains:80] Intake/Output this shift: Total I/O In: 480 [P.O.:480] Out: -    Basename 02/08/12 0547 02/07/12 0607  HGB 9.6* 10.8*    Basename 02/08/12 0547 02/07/12 0607  WBC 7.6 8.4  RBC 3.28* 3.78*  HCT 28.2* 32.7*  PLT 156 198    Basename 02/08/12 0547 02/07/12 0607  NA 138 135  K 3.6 3.9  CL 103 97  CO2 28 29  BUN 9 10  CREATININE 0.66 0.80  GLUCOSE 119* 170*  CALCIUM 8.7 8.6    Basename 02/08/12 0547 02/07/12 0607  LABPT -- --  INR 1.45 1.01    Exam:  Knee wound looks good.  Staples intact.  No signs of infection.  hemovac removed.  abd soft, nontender.    Assessment/Plan: Dulcolax supp now.  Will see how she is voiding this afternoon.  Anticipate d/c home today.     Kayla [name] M 02/08/2012, 10:42 AM

## 2012-02-08 NOTE — Progress Notes (Signed)
Occupational Therapy Treatment Patient Details Name: Kayla [name] MRN: [id number] DOB: 1961-05-06 Today's Date: 02/08/2012 Time: 1222-1230 OT Time Calculation (min): 8 min  OT Assessment / Plan / Recommendation Comments on Treatment Session Pt doing well with ADLs, and able to recall instruction provided yesterday    Follow Up Recommendations  No OT follow up;Supervision - Intermittent    Barriers to Discharge       Equipment Recommendations  None recommended by OT    Recommendations for Other Services    Frequency Min 2X/week   Plan Discharge plan remains appropriate    Precautions / Restrictions Precautions Precautions: Knee Required Braces or Orthoses: Knee Immobilizer - Right Restrictions RLE Weight Bearing: Weight bearing as tolerated       ADL  Tub/Shower Transfer: Performed;Supervision/safety (posterior approach/anterior exit) Tub/Shower Transfer Method: Science writer: Walk in shower Equipment Used: Knee Immobilizer;Rolling walker Transfers/Ambulation Related to ADLs: supervision ADL Comments: Pt. initially attempted to enter shower going forward, but hesitated and self corrected.  She demonstrates good technique and safety    OT Diagnosis:    OT Problem List:   OT Treatment Interventions:     OT Goals ADL Goals ADL Goal: Tub/Shower Transfer - Progress: Met  Visit Information  Last OT Received On: 02/08/12 Assistance Needed: +1    Subjective Data      Prior Functioning       Cognition  Overall Cognitive Status: Appears within functional limits for tasks assessed/performed Arousal/Alertness: Awake/alert Orientation Level: Oriented X4 / Intact Behavior During Session: [facility] for tasks performed    Mobility Bed Mobility Bed Mobility: Supine to Sit Supine to Sit: 5: Supervision;HOB flat Transfers Transfers: Sit to Stand;Stand to Sit Sit to Stand: 5: Supervision;Without upper extremity assist;From bed Stand to Sit:  5: Supervision;With upper extremity assist;To chair/3-in-1   Exercises    Balance    End of Session OT - End of Session Activity Tolerance: Patient tolerated treatment well Patient left: with call bell/phone within reach;in bed   Kayla [name], Kayla [name] 02/08/2012, 1:36 PM

## 2012-02-27 ENCOUNTER — Ambulatory Visit (INDEPENDENT_AMBULATORY_CARE_PROVIDER_SITE_OTHER): Payer: BC Managed Care – PPO | Admitting: Internal Medicine

## 2012-02-27 ENCOUNTER — Encounter: Payer: Self-pay | Admitting: Internal Medicine

## 2012-02-27 VITALS — BP 102/80 | Temp 98.3°F | Wt 220.0 lb

## 2012-02-27 DIAGNOSIS — E119 Type 2 diabetes mellitus without complications: Secondary | ICD-10-CM

## 2012-02-27 DIAGNOSIS — I10 Essential (primary) hypertension: Secondary | ICD-10-CM

## 2012-02-27 MED ORDER — METFORMIN HCL ER 500 MG PO TB24: 1000.0000 mg | ORAL_TABLET | Freq: Every day | ORAL | Status: DC

## 2012-02-27 NOTE — Progress Notes (Signed)
  Subjective:    Patient ID: Kayla [name], female    DOB: [birth year], 51 y.o.   MRN: [id number]  HPI  Wt Readings from Last 3 Encounters:  02/27/12 220 lb (99.791 kg)  02/06/12 227 lb (102.967 kg)  02/06/12 227 lb (102.967 kg)    BP Readings from Last 3 Encounters:  02/27/12 102/80  02/08/12 104/63  02/08/12 45/19   51 year old patient status post recent knee surgery. Blood pressures have remained low in the perioperative period and occasionally associated with orthostatic symptoms. She remains on accommodation therapy. She has type 2 diabetes controlled on low-dose metformin. Last hemoglobin A1c 7.1. She continues to lose weight.  Review of Systems  Neurological: Positive for dizziness, weakness and light-headedness.       Objective:   Physical Exam  Constitutional: She is oriented to person, place, and time. She appears well-developed and well-nourished.       Blood pressure 110/70 in both arms  HENT:  Head: Normocephalic.  Right Ear: External ear normal.  Left Ear: External ear normal.  Mouth/Throat: Oropharynx is clear and moist.  Eyes: Conjunctivae and EOM are normal. Pupils are equal, round, and reactive to light.  Neck: Normal range of motion. Neck supple. No thyromegaly present.  Cardiovascular: Normal rate, regular rhythm, normal heart sounds and intact distal pulses.   Pulmonary/Chest: Effort normal and breath sounds normal.  Abdominal: Soft. Bowel sounds are normal. She exhibits no mass. There is no tenderness.  Musculoskeletal: Normal range of motion.  Lymphadenopathy:    She has no cervical adenopathy.  Neurological: She is alert and oriented to person, place, and time.  Skin: Skin is warm and dry. No rash noted.  Psychiatric: She has a normal mood and affect. Her behavior is normal.          Assessment & Plan:   History of hypertension. Patient has documented hypotension associated with symptoms. We'll challenge soft blood pressure medications  and continue to monitor blood pressure carefully Diabetes. We'll increase metformin to 1 g daily  Recheck 3 months

## 2012-02-27 NOTE — Patient Instructions (Signed)
Limit your sodium (Salt) intake    It is important that you exercise regularly, at least 20 minutes 3 to 4 times per week.  If you develop chest pain or shortness of breath seek  medical attention.  You need to lose weight.  Consider a lower calorie diet and regular exercise.   Please check your hemoglobin A1c every 3 months  Please check your blood pressure on a regular basis.  If it is consistently greater than 140/90, please make an office appointment.

## 2012-03-04 NOTE — Discharge Summary (Signed)
  ABBREVIATED DISCHARGE SUMMARY      DATE OF HOSPITALIZATION:  06 Feb 2012   REASON FOR HOSPITALIZATION:  51 yo wf with end stage djd right knee and pain.    SIGNIFICANT FINDINGS:  DJD  OPERATION:  Right total knee replacement  FINAL DIAGNOSIS:   Same  SECONDARY DIAGNOSIS: none  CONSULTANTS:  none  DISCHARGE CONDITION:  STABLE  DISCHARGED TO:  HOME

## 2012-04-04 ENCOUNTER — Ambulatory Visit: Payer: BC Managed Care – PPO | Admitting: Internal Medicine

## 2012-05-05 ENCOUNTER — Telehealth: Payer: Self-pay | Admitting: Internal Medicine

## 2012-05-05 NOTE — Telephone Encounter (Signed)
Pt would like to come and give urine sample tomorrow. Pt has a remote history of uti's.

## 2012-05-05 NOTE — Telephone Encounter (Signed)
ok 

## 2012-05-05 NOTE — Telephone Encounter (Signed)
Attempt tp call- "[employer] customer-cant leave msg" Called [employer] number - VM - LMTCB and make lab appt - ok to come by and run just a urine

## 2012-05-05 NOTE — Telephone Encounter (Signed)
You last saw 02/27/12 for BP concerns - please advise - only 2 same day appt slots avaible

## 2012-05-06 ENCOUNTER — Other Ambulatory Visit (INDEPENDENT_AMBULATORY_CARE_PROVIDER_SITE_OTHER): Payer: BC Managed Care – PPO

## 2012-05-06 DIAGNOSIS — R300 Dysuria: Secondary | ICD-10-CM

## 2012-05-06 LAB — POCT URINALYSIS DIPSTICK
Bilirubin, UA: NEGATIVE
Glucose, UA: NEGATIVE
Nitrite, UA: NEGATIVE

## 2012-05-08 ENCOUNTER — Telehealth: Payer: Self-pay | Admitting: Internal Medicine

## 2012-05-08 MED ORDER — CIPROFLOXACIN HCL 500 MG PO TABS: 500.0000 mg | ORAL_TABLET | Freq: Two times a day (BID) | ORAL | Status: AC

## 2012-05-08 NOTE — Telephone Encounter (Signed)
Generic cipro 500  #14 one BID

## 2012-05-08 NOTE — Telephone Encounter (Signed)
attpempt to call- VM - LMTCB if questions cipro sent(fax) to [facility]

## 2012-05-08 NOTE — Telephone Encounter (Signed)
Please advise - UA done 8/27

## 2012-05-08 NOTE — Telephone Encounter (Signed)
Pt called req to get lab results. If results are positive, pls call med to [facility] in [place]. Lv detailed vm if pt not available.

## 2012-05-20 ENCOUNTER — Encounter: Payer: Self-pay | Admitting: Internal Medicine

## 2012-05-20 ENCOUNTER — Ambulatory Visit (INDEPENDENT_AMBULATORY_CARE_PROVIDER_SITE_OTHER): Payer: BC Managed Care – PPO | Admitting: Internal Medicine

## 2012-05-20 VITALS — BP 120/80 | HR 87 | Ht 63.0 in | Wt 225.0 lb

## 2012-05-20 DIAGNOSIS — G4700 Insomnia, unspecified: Secondary | ICD-10-CM

## 2012-05-20 DIAGNOSIS — Z23 Encounter for immunization: Secondary | ICD-10-CM

## 2012-05-20 DIAGNOSIS — G4733 Obstructive sleep apnea (adult) (pediatric): Secondary | ICD-10-CM

## 2012-05-20 NOTE — Progress Notes (Signed)
Subjective:    Patient ID: Kayla [name], female    DOB: 20-Apr-1961, 51 y.o.   MRN: [id number]  HPI 01/07/11- 49 yoF self referred for sleep medicine evaluation. She describes loud snoring, witnessed respiratory distress and daytme tiredness. Occasional ambien at bedtime. Occasional caffeine in day. No ENT surgery. Has considered sleep an issue for 10 years. See sleep questionnaire.  02/15/11- OSA / Insomnia Returns after sleep study  NPSG 01/30/11- Moderate OSA  AHI 19.9/ hr. CPAP 9 gave AHI 0/hr Zolpidem takes too long now to help her get to sleep and doesn't last through the night.  03/29/11-  50 yoF never smoker followed for OSA / Insomnia, complicated by HBP, depression Doing well, using CPAP all night every night. It makes a difference. Download for compliance is pending. Mask and pressure seem fine.  Zolpidem didn't work well for her difficulty initiating and maintaining sleep. Lunesta didn't help at all. We discussed a trial of clonazepam.   05/20/12- 51 yoF never smoker followed for OSA / Insomnia, complicated by HBP, depression Wears CPAP 9/Advanced every night for about 6 hours, nasal pillows mask. Difficulty initiating and maintaining sleep. Sleep hygiene reviewed. Bedtime 8:30 or 9 PM when she lies down with her granddaughter, then tries to sleep until 7 AM is probably longer than her needed sleep time. Failed zolpidem, Lunesta, clonazepam, none of which seemed to help provide comfortable sleep for that long.  ROS-see HPI Constitutional:   No-   weight loss, night sweats, fevers, chills, fatigue, lassitude. HEENT:   No-  headaches, difficulty swallowing, tooth/dental problems, sore throat,       No-  sneezing, itching, ear ache, nasal congestion, post nasal drip,  CV:  No-   chest pain, orthopnea, PND, swelling in lower extremities, anasarca,  dizziness, palpitations Resp: No-   shortness of breath with exertion or at rest.              No-   productive cough,  No  non-productive cough,  No- coughing up of blood.              No-   change in color of mucus.  No- wheezing.   Skin: No-   rash or lesions. GI:  No-   heartburn, indigestion, abdominal pain, nausea, vomiting,  GU: . MS:  No-   joint pain or swelling.  . Neuro-     nothing unusual Psych:  No- change in mood or affect. No depression or anxiety.  No memory loss.  OBJ- Physical Exam General- Alert, Oriented, Affect-appropriate, Distress- none acute; weight Skin- rash-none, lesions- none, excoriation- none Lymphadenopathy- none Head- atraumatic            Eyes- Gross vision intact, PERRLA, conjunctivae and secretions clear            Ears- Hearing, canals-normal            Nose- Clear, no-Septal dev, mucus, polyps, erosion, perforation             Throat- Mallampati II , mucosa clear , drainage- none, tonsils- atrophic Neck- flexible , trachea midline, no stridor , thyroid nl, carotid no bruit Chest - symmetrical excursion , unlabored           Heart/CV- RRR , no murmur , no gallop  , no rub, nl s1 s2                           - JVD-  none , edema- none, stasis changes- none, varices- none           Lung- clear to P&A, wheeze- none, cough- none , dullness-none, rub- none           Chest wall-  Abd-  Br/ Gen/ Rectal- Not done, not indicated Extrem- cyanosis- none, clubbing, none, atrophy- none, strength- nl. Right knee surgical incision scar. Neuro- grossly intact to observation Assessment & Plan:

## 2012-05-20 NOTE — Patient Instructions (Addendum)
Flu vax  Continue CPAP 9/ Advanced  To help with sleep- Set a regular bedtime in a comfortably cool, dark, quiet bedroom. Wind down in the evening, getting away from caffeine, alcohol, bright lights and stimulation as you get closer to bedtime. Try melatonin 3-6 mg every evening 30 minutes before bed Try an otc sleep med like ZZquil 15- 30 minutes before bedtime.  Don't try to stay in bed much longer than your sleep time, allowing for maybe 20 minutes or so to read in bed or etc if that helps you.

## 2012-05-26 ENCOUNTER — Encounter: Payer: Self-pay | Admitting: Internal Medicine

## 2012-05-27 NOTE — Assessment & Plan Note (Signed)
Good compliance and control. Weight loss is encouraged.

## 2012-05-27 NOTE — Assessment & Plan Note (Signed)
She is trying to sleep for longer than we would expect to be normal, 10 or 11 hours, but she is trying to lie down with her granddaughter at 9 PM. We discussed sleep hygiene and expectations. Try for shorter time in bed -On the order of 7 or 8 hours of sleep. She can try melatonin and OTC diphenhydramine if needed. Flu vaccine

## 2012-05-28 ENCOUNTER — Encounter (HOSPITAL_COMMUNITY)
Admission: RE | Admit: 2012-05-28 | Discharge: 2012-05-28 | Disposition: A | Discharge status: Home / Self Care | Payer: BC Managed Care – PPO | Source: Ambulatory Visit | Attending: Orthopedic Surgery | Admitting: Orthopedic Surgery

## 2012-05-28 ENCOUNTER — Encounter (HOSPITAL_COMMUNITY): Payer: Self-pay

## 2012-05-28 ENCOUNTER — Encounter (HOSPITAL_COMMUNITY): Payer: Self-pay | Admitting: Pharmacy Technician

## 2012-05-28 HISTORY — DX: Headache: R51

## 2012-05-28 LAB — URINALYSIS, ROUTINE W REFLEX MICROSCOPIC
Bilirubin Urine: NEGATIVE
Ketones, ur: NEGATIVE mg/dL
Nitrite: NEGATIVE
pH: 5.5 (ref 5.0–8.0)

## 2012-05-28 LAB — SURGICAL PCR SCREEN: Staphylococcus aureus: POSITIVE — AB

## 2012-05-28 LAB — PROTIME-INR: INR: 0.97 (ref 0.00–1.49)

## 2012-05-28 LAB — CBC
HCT: 39.5 % (ref 36.0–46.0)
Hemoglobin: 13.5 g/dL (ref 12.0–15.0)
MCV: 81.4 fL (ref 78.0–100.0)
Platelets: 285 10*3/uL (ref 150–400)
RBC: 4.85 MIL/uL (ref 3.87–5.11)
WBC: 9.2 10*3/uL (ref 4.0–10.5)

## 2012-05-28 LAB — COMPREHENSIVE METABOLIC PANEL
AST: 17 U/L (ref 0–37)
BUN: 11 mg/dL (ref 6–23)
CO2: 27 mEq/L (ref 19–32)
Chloride: 102 mEq/L (ref 96–112)
Creatinine, Ser: 0.83 mg/dL (ref 0.50–1.10)
GFR calc non Af Amer: 79 mL/min — ABNORMAL LOW (ref >90)
Total Bilirubin: 0.3 mg/dL (ref 0.3–1.2)

## 2012-05-28 LAB — TYPE AND SCREEN: ABO/RH(D): A POS

## 2012-05-28 NOTE — H&P (Signed)
  [facility] [address]   [address] [place], [place] [address] [phone] A Division of [facility]  Loreta Ave, M.D.   Robert A. Thurston Hole, M.D.   Burnell Blanks, M.D.   Eulas Post, M.D.   Lunette Stands, M.D Buford Dresser, M.D.  Charlsie Quest, M.D.   Estell Harpin, M.D.   Melina Fiddler, M.D. Genene Churn. Barry Dienes, PA-C            Kirstin A. Shepperson, PA-C Josh Brookview, PA-C [place], [place]   RE: Kayla, [name]                                [id number]      DOB: [birth year] PROGRESS NOTE: 05-22-12 Chief complaint: Left knee pain.  History of present illness: 19 three year-old white female with a history of end stage DJD, left knee, and chronic pain.  Returns.  States that knee symptoms are unchanged from previous visit.  She is wanting to proceed with total knee replacement as scheduled.  She is status post right total knee replacement on Feb 06, 2012 which is doing very well.  Pleased with her surgical result up to this point.   Current medications: Atorvastatin, Clonazepam, Lorazepam, Meloxicam, Metformin, Oxybutynin, Sertraline and Ambien. Allergies: Question Morphine. Past medical/surgical history: Right total knee replacement, hysterectomy, bladder tack procedure, left rotator cuff repair, bilateral knee scopes, hypercholesterolemia, hypertension and Type II diabetes. Review of systems: Patient currently denies lightheadedness, dizziness, fevers or chills, cardiac, pulmonary, GI, GU or neuro issues. Family history: Positive for heart disease, hypertension, diabetes, cancer and stroke. Social history: Does not smoke or drink.  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     EXAMINATION: Height: 5?4.  Weight: 221 pounds.  Blood pressure: 132/86.  Temperature: 98.2.  Pulse: 73.  Pleasant white female, alert and oriented x 3 and in no acute distress.  No increase in respiratory effort.   Head is normocephalic, a traumatic.  PERRLA, EOMI.  Cervical spine unremarkable.  Lungs: CTA bilaterally.  No wheezes.  Heart: RRR.  S1 and S2.  No murmurs.  Abdomen: Round and non-distended.  NBS x 4.  Soft and non-tender.  Left knee: Decreased range of motion.  Positive crepitus.  Joint line tender.  Positive effusion.  Ligaments stable.  Calf non-tender.  Neurovascularly intact.  Skin warm and dry.   X-RAYS: Previous left knee x-ray, AP, lateral and sunrise views, show end stage DJD with some periarticular spurs.    IMPRESSION: End stage DJD, left knee, and chronic pain.  Failed conservative treatment.    PLAN: We will proceed with left total knee replacement as scheduled.  Surgical procedure, along with potential rehab/recovery time discussed.  All questions answered.    Genene Churn. Barry Dienes, PA-C   Electronically verified by Loreta Ave, M.D. JMO:jjh D 05-26-12 T 05-26-12

## 2012-05-28 NOTE — Pre-Procedure Instructions (Signed)
20 IRANIA DURELL  05/28/2012   Your procedure is scheduled on:  06/04/12  Report to [facility] at 745 AM.  Call this number if you have problems the morning of surgery: [phone]   Remember:   Do not eat food:After Midnight.   Take these medicines the morning of surgery with A SIP OF WATER: **hydocodone,ativan,robaxin,ditropan,zoloft   Do not wear jewelry, make-up or nail polish.  Do not wear lotions, powders, or perfumes. You may wear deodorant.  Do not shave 48 hours prior to surgery. Men may shave face and neck.  Do not bring valuables to the hospital.  Contacts, dentures or bridgework may not be worn into surgery.  Leave suitcase in the car. After surgery it may be brought to your room.  For patients admitted to the hospital, checkout time is 11:00 AM the day of discharge.   Patients discharged the day of surgery will not be allowed to drive home.  Name and phone number of your driver: family  Special Instructions: CHG Shower Use Special Wash: 1/2 bottle night before surgery and 1/2 bottle morning of surgery.   Please read over the following fact sheets that you were given: Pain Booklet, Coughing and Deep Breathing, Blood Transfusion Information, Total Joint Packet, MRSA Information and Surgical Site Infection Prevention

## 2012-05-28 NOTE — Progress Notes (Signed)
Dr young called for sleep study

## 2012-05-29 ENCOUNTER — Encounter: Payer: Self-pay | Admitting: Internal Medicine

## 2012-05-29 ENCOUNTER — Ambulatory Visit (INDEPENDENT_AMBULATORY_CARE_PROVIDER_SITE_OTHER): Payer: BC Managed Care – PPO | Admitting: Internal Medicine

## 2012-05-29 VITALS — BP 120/84 | Temp 98.0°F | Wt 225.0 lb

## 2012-05-29 DIAGNOSIS — I10 Essential (primary) hypertension: Secondary | ICD-10-CM

## 2012-05-29 DIAGNOSIS — G4733 Obstructive sleep apnea (adult) (pediatric): Secondary | ICD-10-CM

## 2012-05-29 DIAGNOSIS — M1990 Unspecified osteoarthritis, unspecified site: Secondary | ICD-10-CM

## 2012-05-29 DIAGNOSIS — E119 Type 2 diabetes mellitus without complications: Secondary | ICD-10-CM

## 2012-05-29 LAB — GLUCOSE, POCT (MANUAL RESULT ENTRY): POC Glucose: 100 mg/dl — AB (ref 70–99)

## 2012-05-29 MED ORDER — HYDROCODONE-ACETAMINOPHEN 5-325 MG PO TABS: 1.0000 | ORAL_TABLET | Freq: Two times a day (BID) | ORAL | Status: DC | PRN

## 2012-05-29 NOTE — Progress Notes (Signed)
Subjective:    Patient ID: Kayla [name], female    DOB: 07/18/1961, 51 y.o.   MRN: [id number]  HPI  51 year old patient who is seen today for followup. She is scheduled for elective total knee replacement surgery next week. She's had a recent pulmonary evaluation and does have a history of OSA. She has type 2 diabetes which has been stable. She has maintained the muscularis and the control. Fasting blood sugar this morning 100.  She has treated hypertension which has been the well-controlled. Her main complaint today is headaches. This has occurred almost daily for the past 2 or 3 weeks. She wakes up with a dull mild headache but seems to worsen throughout the day. She has been helped with Allegra and Aleve but has been instructed to withhold anti-inflammatories prior to her surgery.  Past Medical History  Diagnosis Date  . Depressive disorder, not elsewhere classified   . Other and unspecified hyperlipidemia   . Osteoarthritis   . Mitral valve prolapse   . Diabetes mellitus   . Sleep apnea     c pap    last sleep study  2 yrs     . Unspecified essential hypertension     fast heart rate     dr Oswaldo Milian  . Shortness of breath   . Headache     History   Social History  . Marital Status: Married    Spouse Name: N/A    Number of Children: 2  . Years of Education: N/A   Occupational History  . Chiropodist    Social History Main Topics  . Smoking status: Never Smoker   . Smokeless tobacco: Never Used  . Alcohol Use: No  . Drug Use: No  . Sexually Active: Not on file   Other Topics Concern  . Not on file   Social History Narrative  . No narrative on file    Past Surgical History  Procedure Date  . Rotator cuff repair   . Vesicovaginal fistula closure w/ tah   . Eye surgery     lasik bilateral   . Knee arthroscopy w/ meniscal repair   . Abdominal hysterectomy   . Total knee arthroplasty 02/06/2012    Procedure: TOTAL KNEE ARTHROPLASTY;  Surgeon: Loreta Ave, MD;  [facility];  Service: Orthopedics;  Laterality: Right;  total knee right side    Family History  Problem Relation Age of Onset  . Heart disease Father   . COPD Mother   . Heart disease Mother   . Heart attack Brother     Allergies  Allergen Reactions  . Morphine And Related Itching    Current Outpatient Prescriptions on File Prior to Visit  Medication Sig Dispense Refill  . atorvastatin (LIPITOR) 20 MG tablet Take 20 mg by mouth daily.      Marland Kitchen LORazepam (ATIVAN) 0.5 MG tablet Take 0.5 mg by mouth 2 (two) times daily as needed. For anxiety      . meloxicam (MOBIC) 7.5 MG tablet Take 7.5 mg by mouth daily.      . metFORMIN (GLUCOPHAGE-XR) 500 MG 24 hr tablet Take 1,000 mg by mouth daily with breakfast.      . oxybutynin (DITROPAN XL) 15 MG 24 hr tablet Take 15 mg by mouth daily.      . sertraline (ZOLOFT) 100 MG tablet Take 100 mg by mouth daily.      Marland Kitchen zolpidem (AMBIEN) 10 MG tablet Take 10 mg by mouth at  bedtime.         BP 120/84  Temp 98 F (36.7 C) (Oral)  Wt 225 lb (102.059 kg)       Review of Systems  Constitutional: Negative.   HENT: Negative for hearing loss, congestion, sore throat, rhinorrhea, dental problem, sinus pressure and tinnitus.   Eyes: Negative for pain, discharge and visual disturbance.  Respiratory: Negative for cough and shortness of breath.   Cardiovascular: Negative for chest pain, palpitations and leg swelling.  Gastrointestinal: Negative for nausea, vomiting, abdominal pain, diarrhea, constipation, blood in stool and abdominal distention.  Genitourinary: Negative for dysuria, urgency, frequency, hematuria, flank pain, vaginal bleeding, vaginal discharge, difficulty urinating, vaginal pain and pelvic pain.  Musculoskeletal: Positive for joint swelling, arthralgias and gait problem.  Skin: Negative for rash.  Neurological: Positive for headaches. Negative for dizziness, syncope, speech difficulty, weakness and numbness.    Hematological: Negative for adenopathy.  Psychiatric/Behavioral: Negative for behavioral problems, dysphoric mood and agitation. The patient is not nervous/anxious.        Objective:   Physical Exam  Constitutional: She is oriented to person, place, and time. She appears well-developed and well-nourished.       Weight 225 Blood pressure 120/80  HENT:  Head: Normocephalic.  Right Ear: External ear normal.  Left Ear: External ear normal.  Mouth/Throat: Oropharynx is clear and moist.  Eyes: Conjunctivae normal and EOM are normal. Pupils are equal, round, and reactive to light.  Neck: Normal range of motion. Neck supple. No thyromegaly present.  Cardiovascular: Normal rate, regular rhythm, normal heart sounds and intact distal pulses.   Pulmonary/Chest: Effort normal and breath sounds normal.  Abdominal: Soft. Bowel sounds are normal. She exhibits no mass. There is no tenderness.  Musculoskeletal: Normal range of motion.  Lymphadenopathy:    She has no cervical adenopathy.  Neurological: She is alert and oriented to person, place, and time.  Skin: Skin is warm and dry. No rash noted.  Psychiatric: She has a normal mood and affect. Her behavior is normal.          Assessment & Plan:   Diabetes mellitus. Appears to be stable we'll continue present regimen Advanced DJD. Scheduled for elective total knee replacement   Hypertension stable OSA stable  Recheck 3 months

## 2012-05-29 NOTE — Patient Instructions (Signed)
Limit your sodium (Salt) intake  Please check your blood pressure on a regular basis.  If it is consistently greater than 150/90, please make an office appointment.   Please check your hemoglobin A1c every 3 months  

## 2012-06-03 MED ORDER — CEFAZOLIN SODIUM-DEXTROSE 2-3 GM-% IV SOLR
2.0000 g | INTRAVENOUS | Status: DC
  Filled 2012-06-03: qty 50

## 2012-06-04 ENCOUNTER — Encounter (HOSPITAL_COMMUNITY): Payer: Self-pay | Admitting: *Deleted

## 2012-06-04 ENCOUNTER — Encounter (HOSPITAL_COMMUNITY)
Admission: RE | Disposition: A | Discharge status: Home / Self Care | Payer: Self-pay | Source: Ambulatory Visit | Attending: Orthopedic Surgery

## 2012-06-04 ENCOUNTER — Encounter (HOSPITAL_COMMUNITY): Payer: Self-pay | Admitting: Anesthesiology

## 2012-06-04 ENCOUNTER — Inpatient Hospital Stay (HOSPITAL_COMMUNITY): Payer: BC Managed Care – PPO

## 2012-06-04 ENCOUNTER — Ambulatory Visit (HOSPITAL_COMMUNITY): Payer: BC Managed Care – PPO | Admitting: Anesthesiology

## 2012-06-04 ENCOUNTER — Inpatient Hospital Stay (HOSPITAL_COMMUNITY)
Admission: RE | Admit: 2012-06-04 | Discharge: 2012-06-06 | DRG: 209 | Disposition: A | Discharge status: Home / Self Care | Payer: BC Managed Care – PPO | Source: Ambulatory Visit | Attending: Orthopedic Surgery | Admitting: Orthopedic Surgery

## 2012-06-04 DIAGNOSIS — I059 Rheumatic mitral valve disease, unspecified: Secondary | ICD-10-CM | POA: Diagnosis present

## 2012-06-04 DIAGNOSIS — G4733 Obstructive sleep apnea (adult) (pediatric): Secondary | ICD-10-CM | POA: Diagnosis present

## 2012-06-04 DIAGNOSIS — F3289 Other specified depressive episodes: Secondary | ICD-10-CM | POA: Diagnosis present

## 2012-06-04 DIAGNOSIS — Z96659 Presence of unspecified artificial knee joint: Secondary | ICD-10-CM

## 2012-06-04 DIAGNOSIS — F329 Major depressive disorder, single episode, unspecified: Secondary | ICD-10-CM | POA: Diagnosis present

## 2012-06-04 DIAGNOSIS — Z23 Encounter for immunization: Secondary | ICD-10-CM

## 2012-06-04 DIAGNOSIS — E119 Type 2 diabetes mellitus without complications: Secondary | ICD-10-CM | POA: Diagnosis present

## 2012-06-04 DIAGNOSIS — E7800 Pure hypercholesterolemia, unspecified: Secondary | ICD-10-CM | POA: Diagnosis present

## 2012-06-04 DIAGNOSIS — I10 Essential (primary) hypertension: Secondary | ICD-10-CM | POA: Diagnosis present

## 2012-06-04 DIAGNOSIS — K219 Gastro-esophageal reflux disease without esophagitis: Secondary | ICD-10-CM | POA: Diagnosis present

## 2012-06-04 DIAGNOSIS — M1710 Unilateral primary osteoarthritis, unspecified knee: Principal | ICD-10-CM | POA: Diagnosis present

## 2012-06-04 DIAGNOSIS — Z01812 Encounter for preprocedural laboratory examination: Secondary | ICD-10-CM

## 2012-06-04 HISTORY — DX: Type 2 diabetes mellitus without complications: E11.9

## 2012-06-04 HISTORY — DX: Post-traumatic stress disorder, unspecified: F43.10

## 2012-06-04 HISTORY — PX: TOTAL KNEE ARTHROPLASTY: SHX125

## 2012-06-04 HISTORY — DX: Obstructive sleep apnea (adult) (pediatric): G47.33

## 2012-06-04 HISTORY — DX: Dependence on other enabling machines and devices: Z99.89

## 2012-06-04 HISTORY — DX: Hereditary deficiency of other clotting factors: D68.2

## 2012-06-04 HISTORY — DX: Other forms of dyspnea: R06.09

## 2012-06-04 HISTORY — DX: Cardiac murmur, unspecified: R01.1

## 2012-06-04 HISTORY — DX: Dyspnea, unspecified: R06.00

## 2012-06-04 LAB — GLUCOSE, CAPILLARY
Glucose-Capillary: 151 mg/dL — ABNORMAL HIGH (ref 70–99)
Glucose-Capillary: 172 mg/dL — ABNORMAL HIGH (ref 70–99)
Glucose-Capillary: 192 mg/dL — ABNORMAL HIGH (ref 70–99)

## 2012-06-04 SURGERY — ARTHROPLASTY, KNEE, TOTAL
Anesthesia: General | Site: Knee | Laterality: Left | Wound class: Clean

## 2012-06-04 MED ORDER — MEPERIDINE HCL 25 MG/ML IJ SOLN: 6.2500 mg | INTRAMUSCULAR | Status: DC | PRN

## 2012-06-04 MED ORDER — METHOCARBAMOL 500 MG PO TABS
500.0000 mg | ORAL_TABLET | Freq: Four times a day (QID) | ORAL | Status: DC | PRN
  Administered 2012-06-04 – 2012-06-05 (×2): 500 mg via ORAL
  Filled 2012-06-04 (×4): qty 1

## 2012-06-04 MED ORDER — DIPHENHYDRAMINE HCL 12.5 MG/5ML PO ELIX: 12.5000 mg | ORAL_SOLUTION | Freq: Four times a day (QID) | ORAL | Status: DC | PRN

## 2012-06-04 MED ORDER — PROPOFOL 10 MG/ML IV BOLUS
INTRAVENOUS | Status: DC | PRN
  Administered 2012-06-04: 100 mg via INTRAVENOUS
  Administered 2012-06-04: 200 mg via INTRAVENOUS
  Administered 2012-06-04: 100 mg via INTRAVENOUS

## 2012-06-04 MED ORDER — HYDROMORPHONE 0.3 MG/ML IV SOLN
INTRAVENOUS | Status: AC
  Filled 2012-06-04: qty 25

## 2012-06-04 MED ORDER — ATORVASTATIN CALCIUM 20 MG PO TABS
20.0000 mg | ORAL_TABLET | Freq: Every day | ORAL | Status: DC
  Administered 2012-06-04 – 2012-06-06 (×3): 20 mg via ORAL
  Filled 2012-06-04 (×3): qty 1

## 2012-06-04 MED ORDER — INSULIN ASPART 100 UNIT/ML ~~LOC~~ SOLN
0.0000 [IU] | Freq: Three times a day (TID) | SUBCUTANEOUS | Status: DC
  Administered 2012-06-04 – 2012-06-05 (×4): 3 [IU] via SUBCUTANEOUS
  Administered 2012-06-06 (×2): 2 [IU] via SUBCUTANEOUS

## 2012-06-04 MED ORDER — MIDAZOLAM HCL 5 MG/5ML IJ SOLN
INTRAMUSCULAR | Status: DC | PRN
  Administered 2012-06-04: 2 mg via INTRAVENOUS

## 2012-06-04 MED ORDER — LIDOCAINE HCL (CARDIAC) 20 MG/ML IV SOLN: INTRAVENOUS | Status: DC | PRN

## 2012-06-04 MED ORDER — LORAZEPAM 0.5 MG PO TABS
0.5000 mg | ORAL_TABLET | Freq: Two times a day (BID) | ORAL | Status: DC | PRN
  Administered 2012-06-05: 0.5 mg via ORAL
  Filled 2012-06-04: qty 1

## 2012-06-04 MED ORDER — MENTHOL 3 MG MT LOZG: 1.0000 | LOZENGE | OROMUCOSAL | Status: DC | PRN

## 2012-06-04 MED ORDER — PHENOL 1.4 % MT LIQD: 1.0000 | OROMUCOSAL | Status: DC | PRN

## 2012-06-04 MED ORDER — CEFAZOLIN SODIUM-DEXTROSE 2-3 GM-% IV SOLR
INTRAVENOUS | Status: DC | PRN
  Administered 2012-06-04: 2 g via INTRAVENOUS

## 2012-06-04 MED ORDER — METFORMIN HCL ER 500 MG PO TB24
1000.0000 mg | ORAL_TABLET | Freq: Every day | ORAL | Status: DC
  Administered 2012-06-05 – 2012-06-06 (×2): 1000 mg via ORAL
  Filled 2012-06-04 (×3): qty 2

## 2012-06-04 MED ORDER — LIDOCAINE HCL (CARDIAC) 20 MG/ML IV SOLN
INTRAVENOUS | Status: DC | PRN
  Administered 2012-06-04: 20 mg via INTRAVENOUS

## 2012-06-04 MED ORDER — BUPIVACAINE-EPINEPHRINE PF 0.5-1:200000 % IJ SOLN
INTRAMUSCULAR | Status: DC | PRN
  Administered 2012-06-04: 30 mL

## 2012-06-04 MED ORDER — OXYBUTYNIN CHLORIDE ER 15 MG PO TB24
15.0000 mg | ORAL_TABLET | Freq: Every day | ORAL | Status: DC
  Administered 2012-06-05 – 2012-06-06 (×2): 15 mg via ORAL
  Filled 2012-06-04 (×3): qty 1

## 2012-06-04 MED ORDER — ONDANSETRON HCL 4 MG/2ML IJ SOLN: 4.0000 mg | Freq: Four times a day (QID) | INTRAMUSCULAR | Status: DC | PRN

## 2012-06-04 MED ORDER — BUPIVACAINE HCL (PF) 0.25 % IJ SOLN
INTRAMUSCULAR | Status: DC | PRN
  Administered 2012-06-04: 30 mL

## 2012-06-04 MED ORDER — ACETAMINOPHEN 325 MG PO TABS: 650.0000 mg | ORAL_TABLET | Freq: Four times a day (QID) | ORAL | Status: DC | PRN

## 2012-06-04 MED ORDER — METHOCARBAMOL 100 MG/ML IJ SOLN
500.0000 mg | Freq: Four times a day (QID) | INTRAVENOUS | Status: DC | PRN
  Administered 2012-06-04: 500 mg via INTRAVENOUS
  Filled 2012-06-04: qty 5

## 2012-06-04 MED ORDER — METOCLOPRAMIDE HCL 10 MG PO TABS: 5.0000 mg | ORAL_TABLET | Freq: Three times a day (TID) | ORAL | Status: DC | PRN

## 2012-06-04 MED ORDER — WARFARIN VIDEO: Freq: Once | Status: DC

## 2012-06-04 MED ORDER — CEFAZOLIN SODIUM 1-5 GM-% IV SOLN
1.0000 g | Freq: Three times a day (TID) | INTRAVENOUS | Status: AC
  Administered 2012-06-04 – 2012-06-05 (×3): 1 g via INTRAVENOUS
  Filled 2012-06-04 (×3): qty 50

## 2012-06-04 MED ORDER — MIDAZOLAM HCL 2 MG/2ML IJ SOLN: 0.5000 mg | Freq: Once | INTRAMUSCULAR | Status: DC | PRN

## 2012-06-04 MED ORDER — HYDROMORPHONE HCL PF 1 MG/ML IJ SOLN
INTRAMUSCULAR | Status: AC
  Filled 2012-06-04: qty 1

## 2012-06-04 MED ORDER — WARFARIN SODIUM 7.5 MG PO TABS
7.5000 mg | ORAL_TABLET | Freq: Once | ORAL | Status: AC
  Administered 2012-06-04: 7.5 mg via ORAL
  Filled 2012-06-04: qty 1

## 2012-06-04 MED ORDER — SODIUM CHLORIDE 0.9 % IR SOLN
Status: DC | PRN
  Administered 2012-06-04: 3000 mL

## 2012-06-04 MED ORDER — 0.9 % SODIUM CHLORIDE (POUR BTL) OPTIME
TOPICAL | Status: DC | PRN
  Administered 2012-06-04: 1000 mL

## 2012-06-04 MED ORDER — BUPIVACAINE HCL (PF) 0.25 % IJ SOLN
INTRAMUSCULAR | Status: AC
  Filled 2012-06-04: qty 30

## 2012-06-04 MED ORDER — HYDROMORPHONE 0.3 MG/ML IV SOLN
INTRAVENOUS | Status: DC
  Administered 2012-06-04: 1.79 mg via INTRAVENOUS
  Administered 2012-06-04: 13:00:00 via INTRAVENOUS
  Administered 2012-06-04: 0.9 mg via INTRAVENOUS
  Administered 2012-06-04: 1.53 mg via INTRAVENOUS

## 2012-06-04 MED ORDER — HYDROMORPHONE HCL PF 1 MG/ML IJ SOLN
INTRAMUSCULAR | Status: AC
  Administered 2012-06-04: 0.5 mg via INTRAVENOUS
  Filled 2012-06-04: qty 1

## 2012-06-04 MED ORDER — COUMADIN BOOK
Freq: Once | Status: DC
  Filled 2012-06-04: qty 1

## 2012-06-04 MED ORDER — METOCLOPRAMIDE HCL 5 MG/ML IJ SOLN: 5.0000 mg | Freq: Three times a day (TID) | INTRAMUSCULAR | Status: DC | PRN

## 2012-06-04 MED ORDER — LACTATED RINGERS IV SOLN
INTRAVENOUS | Status: DC
  Administered 2012-06-04: 09:00:00 via INTRAVENOUS

## 2012-06-04 MED ORDER — LACTATED RINGERS IV SOLN
INTRAVENOUS | Status: DC | PRN
  Administered 2012-06-04 (×2): via INTRAVENOUS

## 2012-06-04 MED ORDER — SODIUM CHLORIDE 0.9 % IJ SOLN: 9.0000 mL | INTRAMUSCULAR | Status: DC | PRN

## 2012-06-04 MED ORDER — DOCUSATE SODIUM 100 MG PO CAPS
100.0000 mg | ORAL_CAPSULE | Freq: Two times a day (BID) | ORAL | Status: DC
  Administered 2012-06-04 – 2012-06-06 (×5): 100 mg via ORAL
  Filled 2012-06-04 (×7): qty 1

## 2012-06-04 MED ORDER — HYDROMORPHONE HCL PF 1 MG/ML IJ SOLN
0.2500 mg | INTRAMUSCULAR | Status: DC | PRN
  Administered 2012-06-04 (×4): 0.5 mg via INTRAVENOUS

## 2012-06-04 MED ORDER — WARFARIN - PHARMACIST DOSING INPATIENT
Freq: Every day | Status: DC
  Administered 2012-06-05: 18:00:00

## 2012-06-04 MED ORDER — FENTANYL CITRATE 0.05 MG/ML IJ SOLN
INTRAMUSCULAR | Status: DC | PRN
  Administered 2012-06-04: 100 ug via INTRAVENOUS
  Administered 2012-06-04 (×2): 50 ug via INTRAVENOUS
  Administered 2012-06-04: 100 ug via INTRAVENOUS

## 2012-06-04 MED ORDER — ONDANSETRON HCL 4 MG PO TABS: 4.0000 mg | ORAL_TABLET | Freq: Four times a day (QID) | ORAL | Status: DC | PRN

## 2012-06-04 MED ORDER — STERILE WATER FOR IRRIGATION IR SOLN
Status: DC | PRN
  Administered 2012-06-04: 1000 mL

## 2012-06-04 MED ORDER — PROMETHAZINE HCL 25 MG/ML IJ SOLN: 6.2500 mg | INTRAMUSCULAR | Status: DC | PRN

## 2012-06-04 MED ORDER — SERTRALINE HCL 100 MG PO TABS
100.0000 mg | ORAL_TABLET | Freq: Every day | ORAL | Status: DC
  Administered 2012-06-05 – 2012-06-06 (×2): 100 mg via ORAL
  Filled 2012-06-04 (×3): qty 1

## 2012-06-04 MED ORDER — NALOXONE HCL 0.4 MG/ML IJ SOLN: 0.4000 mg | INTRAMUSCULAR | Status: DC | PRN

## 2012-06-04 MED ORDER — SENNOSIDES-DOCUSATE SODIUM 8.6-50 MG PO TABS: 1.0000 | ORAL_TABLET | Freq: Every evening | ORAL | Status: DC | PRN

## 2012-06-04 MED ORDER — ACETAMINOPHEN 650 MG RE SUPP: 650.0000 mg | Freq: Four times a day (QID) | RECTAL | Status: DC | PRN

## 2012-06-04 MED ORDER — DIPHENHYDRAMINE HCL 50 MG/ML IJ SOLN: 12.5000 mg | Freq: Four times a day (QID) | INTRAMUSCULAR | Status: DC | PRN

## 2012-06-04 MED ORDER — ENOXAPARIN SODIUM 30 MG/0.3ML ~~LOC~~ SOLN
30.0000 mg | Freq: Two times a day (BID) | SUBCUTANEOUS | Status: DC
  Administered 2012-06-05 – 2012-06-06 (×3): 30 mg via SUBCUTANEOUS
  Filled 2012-06-04 (×5): qty 0.3

## 2012-06-04 MED ORDER — POTASSIUM CHLORIDE IN NACL 20-0.9 MEQ/L-% IV SOLN
INTRAVENOUS | Status: DC
  Administered 2012-06-04 – 2012-06-05 (×3): via INTRAVENOUS
  Filled 2012-06-04 (×6): qty 1000

## 2012-06-04 SURGICAL SUPPLY — 57 items
BANDAGE ESMARK 6X9 LF (GAUZE/BANDAGES/DRESSINGS) ×1 IMPLANT
BLADE SAG 18X100X1.27 (BLADE) ×4 IMPLANT
BNDG ESMARK 6X9 LF (GAUZE/BANDAGES/DRESSINGS) ×2
BOOTCOVER CLEANROOM LRG (PROTECTIVE WEAR) ×4 IMPLANT
BOWL SMART MIX CTS (DISPOSABLE) ×2 IMPLANT
CEMENT BONE SIMPLEX SPEEDSET (Cement) ×4 IMPLANT
CLOTH BEACON ORANGE TIMEOUT ST (SAFETY) ×2 IMPLANT
CO AXIAL FAN SPRAY TIP SOFT SH (MISCELLANEOUS) ×2 IMPLANT
COVER BACK TABLE 24X17X13 BIG (DRAPES) ×2 IMPLANT
COVER SURGICAL LIGHT HANDLE (MISCELLANEOUS) ×2 IMPLANT
CUFF TOURNIQUET SINGLE 34IN LL (TOURNIQUET CUFF) ×2 IMPLANT
DRAPE EXTREMITY T 121X128X90 (DRAPE) ×2 IMPLANT
DRAPE PROXIMA HALF (DRAPES) ×2 IMPLANT
DRAPE U-SHAPE 47X51 STRL (DRAPES) ×2 IMPLANT
DRSG ADAPTIC 3X8 NADH LF (GAUZE/BANDAGES/DRESSINGS) ×2 IMPLANT
DRSG PAD ABDOMINAL 8X10 ST (GAUZE/BANDAGES/DRESSINGS) IMPLANT
DURAPREP 26ML APPLICATOR (WOUND CARE) ×2 IMPLANT
ELECT CAUTERY BLADE 6.4 (BLADE) ×2 IMPLANT
ELECT REM PT RETURN 9FT ADLT (ELECTROSURGICAL) ×2
ELECTRODE REM PT RTRN 9FT ADLT (ELECTROSURGICAL) ×1 IMPLANT
EVACUATOR 1/8 PVC DRAIN (DRAIN) ×2 IMPLANT
FACESHIELD LNG OPTICON STERILE (SAFETY) ×2 IMPLANT
GAUZE XEROFORM 5X9 LF (GAUZE/BANDAGES/DRESSINGS) IMPLANT
GLOVE BIOGEL PI IND STRL 8 (GLOVE) ×1 IMPLANT
GLOVE BIOGEL PI INDICATOR 8 (GLOVE) ×1
GLOVE ORTHO TXT STRL SZ7.5 (GLOVE) ×2 IMPLANT
GOWN PREVENTION PLUS XLARGE (GOWN DISPOSABLE) ×4 IMPLANT
GOWN STRL NON-REIN LRG LVL3 (GOWN DISPOSABLE) ×4 IMPLANT
GOWN STRL REIN 2XL XLG LVL4 (GOWN DISPOSABLE) ×2 IMPLANT
IMMOBILIZER KNEE 22 UNIV (SOFTGOODS) ×2 IMPLANT
IMMOBILIZER KNEE 24 THIGH 36 (MISCELLANEOUS) IMPLANT
IMMOBILIZER KNEE 24 UNIV (MISCELLANEOUS)
KIT BASIN OR (CUSTOM PROCEDURE TRAY) ×2 IMPLANT
KIT ROOM TURNOVER OR (KITS) ×2 IMPLANT
MANIFOLD NEPTUNE II (INSTRUMENTS) ×2 IMPLANT
NS IRRIG 1000ML POUR BTL (IV SOLUTION) ×2 IMPLANT
PACK TOTAL JOINT (CUSTOM PROCEDURE TRAY) ×2 IMPLANT
PAD ARMBOARD 7.5X6 YLW CONV (MISCELLANEOUS) ×4 IMPLANT
PAD CAST 4YDX4 CTTN HI CHSV (CAST SUPPLIES) ×1 IMPLANT
PADDING CAST COTTON 4X4 STRL (CAST SUPPLIES) ×1
PADDING CAST COTTON 6X4 STRL (CAST SUPPLIES) ×2 IMPLANT
RUBBERBAND STERILE (MISCELLANEOUS) ×2 IMPLANT
SPONGE GAUZE 4X4 12PLY (GAUZE/BANDAGES/DRESSINGS) ×2 IMPLANT
STAPLER VISISTAT 35W (STAPLE) ×2 IMPLANT
SUCTION FRAZIER TIP 10 FR DISP (SUCTIONS) ×2 IMPLANT
SUT ETHILON 4 0 PS 2 18 (SUTURE) ×2 IMPLANT
SUT VIC AB 1 CTX 36 (SUTURE) ×2
SUT VIC AB 1 CTX36XBRD ANBCTR (SUTURE) ×2 IMPLANT
SUT VIC AB 2-0 CT1 27 (SUTURE) ×3
SUT VIC AB 2-0 CT1 36 (SUTURE) ×2 IMPLANT
SUT VIC AB 2-0 CT1 TAPERPNT 27 (SUTURE) ×3 IMPLANT
SYR 30ML LL (SYRINGE) ×2 IMPLANT
SYR 30ML SLIP (SYRINGE) ×2 IMPLANT
TOWEL OR 17X24 6PK STRL BLUE (TOWEL DISPOSABLE) ×2 IMPLANT
TOWEL OR 17X26 10 PK STRL BLUE (TOWEL DISPOSABLE) ×2 IMPLANT
TRAY FOLEY CATH 14FR (SET/KITS/TRAYS/PACK) ×2 IMPLANT
WATER STERILE IRR 1000ML POUR (IV SOLUTION) ×6 IMPLANT

## 2012-06-04 NOTE — Progress Notes (Signed)
PATIENT IS COMPLAINING OF PAIN IN HER CALF NEAR THE BOTTOM OF HER DRESSING, PATIENT DIABETIC AND CBG 192 , PATIENT WEARS A CPAP AT HOME. MD NOTIFIED FOR ISSUES MENTIONED. WILL CONTINUE TO MONITOR.

## 2012-06-04 NOTE — Anesthesia Postprocedure Evaluation (Signed)
  Anesthesia Post-op Note  Patient: Kayla [name]  Procedure(s) Performed: Procedure(s) (LRB) with comments: TOTAL KNEE ARTHROPLASTY (Left)  Patient Location: PACU  Anesthesia Type: GA combined with regional for post-op pain  Level of Consciousness: sedated, patient cooperative and responds to stimulation and voice  Airway and Oxygen Therapy: Patient Spontanous Breathing and Patient connected to nasal cannula oxygen  Post-op Pain: mild  Post-op Assessment: Post-op Vital signs reviewed, Patient's Cardiovascular Status Stable, Respiratory Function Stable, Patent Airway, No signs of Nausea or vomiting and Pain level controlled  Post-op Vital Signs: Reviewed and stable  Complications: No apparent anesthesia complications

## 2012-06-04 NOTE — Anesthesia Procedure Notes (Signed)
Anesthesia Regional Block:  Femoral nerve block  Pre-Anesthetic Checklist: ,, timeout performed, Correct Patient, Correct Site, Correct Laterality, Correct Procedure, Correct Position, site marked, Risks and benefits discussed,  Surgical consent,  Pre-op evaluation,  At surgeon's request and post-op pain management  Laterality: Left  Prep: chloraprep       Needles:  Injection technique: Single-shot  Needle Type: Stimulator Needle - 40     Needle Length: 4cm  Needle Gauge: 22 and 22 G    Additional Needles:  Procedures: nerve stimulator Femoral nerve block  Nerve Stimulator or Paresthesia:  Response: patella twitch, 0.45 mA, 0.1 ms,   Additional Responses:   Narrative:  Start time: 06/04/2012 9:22 AM End time: 06/04/2012 9:28 AM Injection made incrementally with aspirations every 5 mL.  Performed by: Personally  Anesthesiologist: Sandford Craze, MD  Additional Notes: Pt identified in Holding room.  Monitors applied. Working IV access confirmed. Sterile prep L groin.  #22ga PNS to patella twitch at 0.33mA threshold.  30cc 0.5% Bupivacaine with 1:200k epi injected incrementally after negative test dose.  Patient asymptomatic, VSS, no heme aspirated, tolerated well.   Sandford Craze, MD  Femoral nerve block

## 2012-06-04 NOTE — Preoperative (Signed)
Beta Blockers   Reason not to administer Beta Blockers:Not Applicable 

## 2012-06-04 NOTE — Progress Notes (Signed)
ANTICOAGULATION CONSULT NOTE - Initial Consult  Pharmacy Consult for Coumadin  Indication: VTE prophylaxis  Allergies  Allergen Reactions  . Morphine And Related Itching    Patient Measurements:   Heparin Dosing Weight:   Vital Signs: Temp: 97 F (36.1 C) (09/25 1424) Temp src: Oral (09/25 0827) BP: 103/57 mmHg (09/25 1416) Pulse Rate: 112  (09/25 1416)  Labs: No results found for this basename: HGB:2,HCT:3,PLT:3,APTT:3,LABPROT:3,INR:3,HEPARINUNFRC:3,CREATININE:3,CKTOTAL:3,CKMB:3,TROPONINI:3 in the last 72 hours  The CrCl is unknown because both a height and weight (above a minimum accepted value) are required for this calculation.   Medical History: Past Medical History  Diagnosis Date  . Depressive disorder, not elsewhere classified   . Other and unspecified hyperlipidemia   . Osteoarthritis   . Mitral valve prolapse   . Diabetes mellitus   . Sleep apnea     c pap    last sleep study  2 yrs     . Unspecified essential hypertension     fast heart rate     dr Oswaldo Milian  . Shortness of breath   . Headache     Medications:  Scheduled:    . atorvastatin  20 mg Oral Daily  .  ceFAZolin (ANCEF) IV  1 g Intravenous Q8H  . coumadin book   Does not apply Once  . docusate sodium  100 mg Oral BID  . enoxaparin (LOVENOX) injection  30 mg Subcutaneous Q12H  . HYDROmorphone      . HYDROmorphone PCA 0.3 mg/mL   Intravenous Q4H  . insulin aspart  0-15 Units Subcutaneous TID WC  . metFORMIN  1,000 mg Oral Q breakfast  . oxybutynin  15 mg Oral Daily  . sertraline  100 mg Oral Daily  . warfarin  7.5 mg Oral ONCE-1800  . warfarin   Does not apply Once  . Warfarin - Pharmacist Dosing Inpatient   Does not apply q1800  . DISCONTD:  ceFAZolin (ANCEF) IV  2 g Intravenous 60 min Pre-Op    Assessment: 51 yr old female admitted today for total knee replacement of the left knee. She is status post rt total knee replacement in May and is doing well. INR is 0.97. Goal of Therapy:    INR 2-3 Monitor platelets by anticoagulation protocol: Yes   Plan:  1) Coumadin 7.5 mg today (score=7) 2) Daily PT/INR 3) Give pt coumadin booklet 4) Order coumadin video for pt to watch.  Eugene Garnet 06/04/2012,5:06 PM

## 2012-06-04 NOTE — Brief Op Note (Signed)
06/04/2012  1:05 PM  PATIENT:  Kayla [name]  51 y.o. female  PRE-OPERATIVE DIAGNOSIS:  DJD LEFT KNEE  POST-OPERATIVE DIAGNOSIS:  DJD LEFT KNEE  PROCEDURE:  Procedure(s) (LRB) with comments: TOTAL KNEE ARTHROPLASTY (Left)  SURGEON:  Surgeon(s) and Role:    * Loreta Ave, MD - Primary  PHYSICIAN ASSISTANT: Zonia Kief M   ANESTHESIA:   regional and general  EBL:  Total I/O In: 1500 [I.V.:1500] Out: 250 [Urine:200; Blood:50]   SPECIMEN:  No Specimen  DISPOSITION OF SPECIMEN:  N/A  COUNTS:  YES  TOURNIQUET:   Total Tourniquet Time Documented: Thigh (Left) - 67 minutes   PATIENT DISPOSITION:  PACU - hemodynamically stable.

## 2012-06-04 NOTE — Progress Notes (Signed)
Patient on PCA full dose Dilaudid, pain minimal around 3-5 out of 10. Vitals 98.9, HR 112, 117/60, R-18, 02 sat-95 on CPAP. Per report at beginning of shift her CO2 level had increased to high 60's, respiratory instructed [place] to place pt on her home CPAP in hopes the level comes down. Pt has been on CPAP for one hour currently with CO2 level getting as high as 72. Montez Morita, PA on call was notified. Order given to d/c PCA now, give Tylenol at this time and call him again once CO2 decreases for further pain med orders if needed. Will continue to monitor.

## 2012-06-04 NOTE — Transfer of Care (Signed)
Immediate Anesthesia Transfer of Care Note  Patient: Kayla [name]  Procedure(s) Performed: Procedure(s) (LRB) with comments: TOTAL KNEE ARTHROPLASTY (Left)  Patient Location: PACU  Anesthesia Type: General and Regional  Level of Consciousness: awake, alert , oriented and sedated  Airway & Oxygen Therapy: Patient Spontanous Breathing and Patient connected to nasal cannula oxygen  Post-op Assessment: Report given to PACU RN, Post -op Vital signs reviewed and stable and Patient moving all extremities X 4  Post vital signs: Reviewed and stable  Complications: No apparent anesthesia complications

## 2012-06-04 NOTE — Interval H&P Note (Signed)
History and Physical Interval Note:  06/04/2012 8:18 AM  Kayla [name]  has presented today for surgery, with the diagnosis of DJD LEFT KNEE  The various methods of treatment have been discussed with the patient and family. After consideration of risks, benefits and other options for treatment, the patient has consented to  Procedure(s) (LRB) with comments: TOTAL KNEE ARTHROPLASTY (Left) as a surgical intervention .  The patient's history has been reviewed, patient examined, no change in status, stable for surgery.  I have reviewed the patient's chart and labs.  Questions were answered to the patient's satisfaction.     Kayla [name]

## 2012-06-04 NOTE — Anesthesia Preprocedure Evaluation (Signed)
Anesthesia Evaluation  Patient identified by MRN, date of birth, ID band Patient awake    Reviewed: Allergy & Precautions, H&P , NPO status , Patient's Chart, lab work & pertinent test results  History of Anesthesia Complications Negative for: history of anesthetic complications  Airway Mallampati: I TM Distance: >3 FB Neck ROM: Full    Dental No notable dental hx. (+) Teeth Intact and Dental Advisory Given   Pulmonary neg shortness of breath, sleep apnea and Continuous Positive Airway Pressure Ventilation ,  breath sounds clear to auscultation  Pulmonary exam normal       Cardiovascular hypertension (borderline: off meds now), + Valvular Problems/Murmurs (asymptomatic) MVP Rhythm:Regular Rate:Normal     Neuro/Psych  Headaches, PSYCHIATRIC DISORDERS Depression    GI/Hepatic Neg liver ROS, GERD-  Controlled,  Endo/Other  diabetes (glu 151), Well Controlled, Type 2, Oral Hypoglycemic AgentsMorbid obesity  Renal/GU negative Renal ROS     Musculoskeletal   Abdominal (+) + obese,   Peds  Hematology   Anesthesia Other Findings   Reproductive/Obstetrics                           Anesthesia Physical Anesthesia Plan  ASA: III  Anesthesia Plan: General   Post-op Pain Management:    Induction: Intravenous  Airway Management Planned: LMA  Additional Equipment:   Intra-op Plan:   Post-operative Plan: Extubation in OR  Informed Consent: I have reviewed the patients History and Physical, chart, labs and discussed the procedure including the risks, benefits and alternatives for the proposed anesthesia with the patient or authorized representative who has indicated his/her understanding and acceptance.   Dental advisory given  Plan Discussed with: CRNA and Surgeon  Anesthesia Plan Comments: (Plan routine monitors, GA- LMA OK, femoral nerve block for post op analgesia )        Anesthesia  Quick Evaluation

## 2012-06-04 NOTE — Progress Notes (Signed)
Orthopedic Tech Progress Note Patient Details:  Kayla [name] [birth year] [id number]  CPM Left Knee CPM Left Knee: On Left Knee Flexion (Degrees): 60  Left Knee Extension (Degrees): 0  Additional Comments: trapeze bar   Cammer, Mickie Bail 06/04/2012, 1:06 PM

## 2012-06-05 ENCOUNTER — Encounter (HOSPITAL_COMMUNITY): Payer: Self-pay | Admitting: General Practice

## 2012-06-05 LAB — CBC
HCT: 34.9 % — ABNORMAL LOW (ref 36.0–46.0)
MCH: 28 pg (ref 26.0–34.0)
MCHC: 33.8 g/dL (ref 30.0–36.0)
MCV: 82.9 fL (ref 78.0–100.0)
Platelets: 243 10*3/uL (ref 150–400)
RDW: 14.3 % (ref 11.5–15.5)
WBC: 10.6 10*3/uL — ABNORMAL HIGH (ref 4.0–10.5)

## 2012-06-05 LAB — BASIC METABOLIC PANEL
BUN: 8 mg/dL (ref 6–23)
Calcium: 9.3 mg/dL (ref 8.4–10.5)
Creatinine, Ser: 0.73 mg/dL (ref 0.50–1.10)
GFR calc Af Amer: >90 mL/min (ref >90)
GFR calc non Af Amer: >90 mL/min (ref >90)

## 2012-06-05 LAB — GLUCOSE, CAPILLARY
Glucose-Capillary: 172 mg/dL — ABNORMAL HIGH (ref 70–99)
Glucose-Capillary: 161 mg/dL — ABNORMAL HIGH (ref 70–99)
Glucose-Capillary: 155 mg/dL — ABNORMAL HIGH (ref 70–99)

## 2012-06-05 MED ORDER — PNEUMOCOCCAL VAC POLYVALENT 25 MCG/0.5ML IJ INJ
0.5000 mL | INJECTION | INTRAMUSCULAR | Status: AC
  Administered 2012-06-06: 0.5 mL via INTRAMUSCULAR
  Filled 2012-06-05: qty 0.5

## 2012-06-05 MED ORDER — OXYCODONE HCL 5 MG PO TABS
5.0000 mg | ORAL_TABLET | ORAL | Status: DC | PRN
  Administered 2012-06-05 – 2012-06-06 (×8): 5 mg via ORAL
  Filled 2012-06-05 (×8): qty 1

## 2012-06-05 MED ORDER — HYDROCODONE-ACETAMINOPHEN 5-325 MG PO TABS
1.0000 | ORAL_TABLET | Freq: Four times a day (QID) | ORAL | Status: DC | PRN
  Administered 2012-06-05 (×2): 2 via ORAL
  Filled 2012-06-05 (×2): qty 2

## 2012-06-05 MED ORDER — METHOCARBAMOL 500 MG PO TABS
500.0000 mg | ORAL_TABLET | Freq: Four times a day (QID) | ORAL | Status: DC | PRN
  Administered 2012-06-05 (×2): 1000 mg via ORAL
  Administered 2012-06-05: 500 mg via ORAL
  Administered 2012-06-06 (×3): 1000 mg via ORAL
  Filled 2012-06-05 (×5): qty 2

## 2012-06-05 MED ORDER — OXYCODONE HCL 5 MG PO TABS
5.0000 mg | ORAL_TABLET | ORAL | Status: DC | PRN
  Administered 2012-06-05: 10 mg via ORAL
  Filled 2012-06-05: qty 2

## 2012-06-05 MED ORDER — WARFARIN SODIUM 7.5 MG PO TABS
7.5000 mg | ORAL_TABLET | Freq: Once | ORAL | Status: AC
  Administered 2012-06-05: 7.5 mg via ORAL
  Filled 2012-06-05: qty 1

## 2012-06-05 MED ORDER — OXYCODONE-ACETAMINOPHEN 5-325 MG PO TABS
1.0000 | ORAL_TABLET | ORAL | Status: DC | PRN
  Administered 2012-06-05 – 2012-06-06 (×8): 2 via ORAL
  Filled 2012-06-05 (×8): qty 2

## 2012-06-05 MED ORDER — DEXTROSE 5 % IV SOLN
500.0000 mg | Freq: Four times a day (QID) | INTRAVENOUS | Status: DC | PRN
  Filled 2012-06-05: qty 5

## 2012-06-05 NOTE — Evaluation (Signed)
Physical Therapy Evaluation Patient Details Name: Kayla [name] MRN: [id number] DOB: 17-Sep-1960 Today's Date: 06/05/2012 Time: 7829-5621 PT Time Calculation (min): 12 min  PT Assessment / Plan / Recommendation Clinical Impression  51 yo female s/p L TKA. Pt tearful throughout session. Mobilizing fairly well but demonstrates limited activity tolerance. Recommend HHPT and 24 hour supervision.     PT Assessment  Patient needs continued PT services    Follow Up Recommendations  Home health PT;Supervision/Assistance - 24 hour    Barriers to Discharge        Equipment Recommendations  None recommended by PT    Recommendations for Other Services OT consult   Frequency 7X/week    Precautions / Restrictions Precautions Required Braces or Orthoses: Knee Immobilizer - Left Restrictions Weight Bearing Restrictions: Yes LLE Weight Bearing: Weight bearing as tolerated   Pertinent Vitals/Pain       Mobility  Transfers Transfers: Sit to Stand;Stand to Sit Sit to Stand: 4: Min assist;From chair/3-in-1;With upper extremity assist;With armrests Stand to Sit: To chair/3-in-1;With armrests;With upper extremity assist;4: Min assist Details for Transfer Assistance: VCS safety, technique, hand placement, L LE placement. Assist to rise, stabilize, control descent. Ambulation/Gait Ambulation/Gait Assistance: 4: Min assist Ambulation Distance (Feet): 15 Feet Assistive device: Rolling walker Ambulation/Gait Assistance Details: VCS safety, technique, sequence. Assist to stabilize throughout ambulation. Pt did not wish to ambulate any further due to pain.  Gait Pattern: Step-to pattern;Antalgic;Decreased stance time - left;Decreased stride length;Decreased step length - right;Decreased step length - left    Shoulder Instructions     Exercises Total Joint Exercises Ankle Circles/Pumps: AROM;Both;10 reps;Seated Quad Sets: AROM;Left;10 reps;Seated (poor quad activation) Hip  ABduction/ADduction: AAROM;Left;10 reps;Seated Straight Leg Raises: AAROM;Left;10 reps;Seated   PT Diagnosis: Difficulty walking;Abnormality of gait;Acute pain  PT Problem List: Decreased strength;Decreased range of motion;Decreased activity tolerance;Decreased mobility;Pain;Decreased knowledge of use of DME PT Treatment Interventions: DME instruction;Gait training;Stair training;Functional mobility training;Therapeutic activities;Therapeutic exercise;Patient/family education   PT Goals Acute Rehab PT Goals PT Goal Formulation: With patient Time For Goal Achievement: 06/12/12 Potential to Achieve Goals: Good Pt will go Supine/Side to Sit: with supervision PT Goal: Supine/Side to Sit - Progress: Goal set today Pt will go Sit to Supine/Side: with supervision PT Goal: Sit to Supine/Side - Progress: Goal set today Pt will go Sit to Stand: with supervision PT Goal: Sit to Stand - Progress: Goal set today Pt will Ambulate: 51 - 150 feet;with supervision;with rolling walker PT Goal: Ambulate - Progress: Goal set today Pt will Go Up / Down Stairs: 3-5 stairs;with rail(s);with least restrictive assistive device;with min assist PT Goal: Up/Down Stairs - Progress: Goal set today Pt will Perform Home Exercise Program: with supervision, verbal cues required/provided PT Goal: Perform Home Exercise Program - Progress: Goal set today  Visit Information  Last PT Received On: 06/05/12 Assistance Needed: +1    Subjective Data  Subjective: "I had a rough night" (pt tearful) Patient Stated Goal: Less pain.    Prior Functioning  Home Living Lives With: Spouse;Family Type of Home: House Home Access: Stairs to enter [employer] of Steps: 3-4 Entrance Stairs-Rails: Can reach both Home Layout: One level Bathroom Shower/Tub: Health visitor: Standard Bathroom Accessibility: Yes How Accessible: Accessible via walker Home Adaptive Equipment: Feeding equipment;Straight  cane;Walker - standard;Bedside commode/3-in-1 Prior Function Level of Independence: Independent with assistive device(s) Able to Take Stairs?: Yes Driving: Yes Vocation: Unemployed Communication Communication: No difficulties Dominant Hand: Right    Cognition  Overall Cognitive Status: Appears within functional limits  for tasks assessed/performed Arousal/Alertness: Awake/alert Orientation Level: Appears intact for tasks assessed Behavior During Session: Anxious Cognition - Other Comments: pt tearful during session.     Extremity/Trunk Assessment Right Upper Extremity Assessment RUE ROM/Strength/Tone: [facility] for tasks assessed Left Upper Extremity Assessment LUE ROM/Strength/Tone: WFL for tasks assessed Right Lower Extremity Assessment RLE ROM/Strength/Tone: [facility] for tasks assessed Left Lower Extremity Assessment LLE ROM/Strength/Tone: Deficits;Unable to fully assess;Due to pain LLE ROM/Strength/Tone Deficits: SLR 2/5, moves ankle fairly well. Hip abd/add 2/5. LLE Sensation: WFL - Light Touch Trunk Assessment Trunk Assessment: Normal   Balance    End of Session PT - End of Session Equipment Utilized During Treatment: Gait belt;Left knee immobilizer Activity Tolerance: Patient limited by pain (Pt tearful during session) Patient left: in chair;with call bell/phone within reach  GP     Rebeca Alert [facility] 06/05/2012, 9:53 AM [phone]

## 2012-06-05 NOTE — Progress Notes (Signed)
Subjective: C/o left knee pain.  dilauded pca discontinued last night due to increased CO2.   Hx sleep apnea.    Objective: Vital signs in last 24 hours: Temp:  [97 F (36.1 C)-99 F (37.2 C)] 99 F (37.2 C) (09/26 0654) Pulse Rate:  [77-113] 113  (09/26 0654) Resp:  [10-20] 18  (09/26 0654) BP: (103-147)/(54-77) 138/59 mmHg (09/26 0654) SpO2:  [92 %-99 %] 97 % (09/26 0654) FiO2 (%):  [21 %] 21 % (09/25 2151)  Intake/Output from previous day: 09/25 0701 - 09/26 0700 In: 3310 [P.O.:480; I.V.:2830] Out: 1950 [Urine:1900; Blood:50] Intake/Output this shift:     Basename 06/05/12 0640  HGB 11.8*    Basename 06/05/12 0640  WBC 10.6*  RBC 4.21  HCT 34.9*  PLT 243   No results found for this basename: NA:2,K:2,CL:2,CO2:2,BUN:2,CREATININE:2,GLUCOSE:2,CALCIUM:2 in the last 72 hours  Basename 06/05/12 0640  LABPT --  INR 1.03    Exam:  Alert and oriented x 3.  Dressing c/d/i.  Calf nt, nvi.    Assessment/Plan: Start therapy.  Anticipate d/c home tomorrow.     Kayla [name] M 06/05/2012, 7:47 AM

## 2012-06-05 NOTE — Progress Notes (Signed)
ANTICOAGULATION CONSULT - COUMADIN  Pharmacy Consult for Coumadin  HPI: 51 y.o.female admitted for DJD LEFT KNEE now s/p L-TKA who is currently on Coumadin with Lovenox bridging for VTE prophylaxis .  Allergies: Allergies  Allergen Reactions  . Morphine And Related Itching    Height/Weight: Height: 5\' 3"  (160 cm) Weight: 224 lb 13.9 oz (102 kg) IBW/kg (Calculated) : 52.4   Vitals: Blood pressure 138/59, pulse 113, temperature 99 F (37.2 C), temperature source Oral, resp. rate 18, height 5\' 3"  (1.6 m), weight 224 lb 13.9 oz (102 kg), SpO2 97.00%.  Medical / Surgical History: Past Medical History  Diagnosis Date  . Depressive disorder, not elsewhere classified   . Other and unspecified hyperlipidemia   . Mitral valve prolapse   . Unspecified essential hypertension     fast heart rate     dr Oswaldo Milian  . Heart murmur   . Anginal pain   . Exertional dyspnea     "sometimes" (06/05/2012)  . OSA on CPAP   . Type II diabetes mellitus   . Anemia   . Factor II deficiency     "prothrombin gene factor II" (06/05/2012)  . Daily headache     "lately" (06/05/2012)  . Osteoarthritis     "knees; shoulders; base of thumbs" (06/05/2012)  . PTSD (post-traumatic stress disorder)    Past Surgical History  Procedure Date  . Vesicovaginal fistula closure w/ tah 1980's    w/bladder tacking  . Eye surgery     lasik bilateral   . Knee arthroscopy w/ meniscal repair     "both"  . Total knee arthroplasty 02/06/2012    Procedure: TOTAL KNEE ARTHROPLASTY;  Surgeon: Loreta Ave, MD;  Location: [facility] OR;  Service: Orthopedics;  Laterality: Right;  total knee right side  . Total knee arthroplasty 06/04/2012    left  . Shoulder arthroscopy w/ rotator cuff repair ~ 2004    left  . Vaginal hysterectomy 1980's    Current Labs:   [facility] 06/05/12 0640  HGB 11.8*  HCT 34.9*  PLT 243  LABPROT 13.4  INR 1.03  CREATININE 0.73   Lab Results  Component Value Date   INR 1.03 06/05/2012   INR 0.97 05/28/2012   INR 1.45 02/08/2012   Estimated Creatinine Clearance: 92.7 ml/min (by C-G formula based on Cr of 0.73).  Pertinent Medication History: Prescriptions prior to admission  Medication Sig Dispense Refill  . atorvastatin (LIPITOR) 20 MG tablet Take 20 mg by mouth daily.      Marland Kitchen HYDROcodone-acetaminophen (NORCO/VICODIN) 5-325 MG per tablet Take 1 tablet by mouth 2 (two) times daily as needed. For pain.  90 tablet  1  . LORazepam (ATIVAN) 0.5 MG tablet Take 0.5 mg by mouth 2 (two) times daily as needed. For anxiety      . meloxicam (MOBIC) 7.5 MG tablet Take 7.5 mg by mouth daily.      . metFORMIN (GLUCOPHAGE-XR) 500 MG 24 hr tablet Take 1,000 mg by mouth daily with breakfast.      . oxybutynin (DITROPAN XL) 15 MG 24 hr tablet Take 15 mg by mouth daily.      . sertraline (ZOLOFT) 100 MG tablet Take 100 mg by mouth daily.      Marland Kitchen zolpidem (AMBIEN) 10 MG tablet Take 10 mg by mouth at bedtime.        Scheduled:    . atorvastatin  20 mg Oral Daily  .  ceFAZolin (ANCEF) IV  1 g Intravenous Q8H  .  coumadin book   Does not apply Once  . docusate sodium  100 mg Oral BID  . enoxaparin (LOVENOX) injection  30 mg Subcutaneous Q12H  . HYDROmorphone      . insulin aspart  0-15 Units Subcutaneous TID WC  . metFORMIN  1,000 mg Oral Q breakfast  . oxybutynin  15 mg Oral Daily  . sertraline  100 mg Oral Daily  . warfarin  7.5 mg Oral ONCE-1800    Assessment:  INR 1.03 following first dose of Coumadin.  No Complications noted.  Goals:  Target INR of 2-3.  Close monitoring of INR response with noted Factor II deficiency.  Patient has previously been on Coumadin following previous knee surgery  Plan:  Will repeat Coumadin 7.5 mg today.  Daily INR's, CBC.  Jaylin Benzel, Elisha Headland, Pharm. D. 06/05/2012, 10:37 AM

## 2012-06-05 NOTE — Plan of Care (Signed)
Problem: Consults Goal: Diagnosis- Total Joint Replacement Outcome: Completed/Met Date Met:  06/05/12 Primary Total Knee

## 2012-06-05 NOTE — Clinical Social Work Psychosocial (Addendum)
    Clinical Social Work Department BRIEF PSYCHOSOCIAL ASSESSMENT 06/05/2012  Patient:  Kayla [name], Kayla [name]     Account Number:  [id number]     Admit date:  06/04/2012  Clinical Social Worker:  Tiburcio Pea  Date/Time:  06/05/2012 12:13 PM  Referred by:  Physician  Date Referred:  06/05/2012 Referred for  Advanced Directives  SNF Placement   Other Referral:   Interview type:  Patient Other interview type:    PSYCHOSOCIAL DATA Living Status:  HUSBAND Admitted from facility:   Level of care:   Primary support name:  Kayla [name] Primary support relationship to patient:  SPOUSE Degree of support available:   Strong support    CURRENT CONCERNS Current Concerns  Other - See comment   Other Concerns:   Wants information re: Advance Directive    SOCIAL WORK ASSESSMENT / PLAN Met with patient today. She was referred for SNF placement and wanting and Advance Directive.  Patient denies any interest in SNF- stating that she will return home with [facility]. (This is recommendation of PT).  She was interested in getting information regarding an advance directive. Patient given document and it was reviewed. She does not wish to fill it out during this hospitalization- states that she will read it later and arrange for it's completion.   Assessment/plan status:  No Further Intervention Required Other assessment/ plan:   Information/referral to community resources:   Advance Directive document given to pt and explained  Discussed HH/DME needs to be arranged by [facility].    PATIENT'S/FAMILY'S RESPONSE TO PLAN OF CARE: Patient is alert, oriented and very pleasant. She was appreciative of CSW's visit and information provided.  She denied any further needs.

## 2012-06-05 NOTE — Progress Notes (Signed)
Occupational Therapy Evaluation Patient Details Name: Kayla [name] MRN: [id number] DOB: Mar 14, 1961 Today's Date: 06/05/2012 Time: 4098-1191 OT Time Calculation (min): 23 min  OT Assessment / Plan / Recommendation Clinical Impression  Pt. 51 yo female s/p left TKA. Pt. very anxious throughout session, would benefit from acute OT to increase independence with LE dressing and shower transfer     OT Assessment  Patient needs continued OT Services    Follow Up Recommendations  Supervision/Assistance - 24 hour    Barriers to Discharge      Equipment Recommendations  None recommended by OT    Recommendations for Other Services    Frequency  Min 2X/week    Precautions / Restrictions Restrictions Weight Bearing Restrictions: Yes LLE Weight Bearing: Weight bearing as tolerated   Pertinent Vitals/Pain Pt. Reported pain level 6/10. Nursing notified    ADL  Eating/Feeding: Simulated;Set up Where Assessed - Eating/Feeding: Bed level Grooming: Performed;Teeth care;Wash/dry face;Set up Where Assessed - Grooming: Supported sitting Lower Body Dressing: Performed;Maximal assistance Where Assessed - Lower Body Dressing: Unsupported sitting Toilet Transfer: Performed;Minimal assistance Toilet Transfer Method: Sit to stand Toilet Transfer Equipment: Raised toilet seat with arms (or 3-in-1 over toilet) Toileting - Clothing Manipulation and Hygiene: Performed;Independent Where Assessed - Toileting Clothing Manipulation and Hygiene: Standing Equipment Used: Rolling walker;Gait belt;Knee Immobilizer Transfers/Ambulation Related to ADLs: Pt. very anxious and tearful to get OOB. Pt. transfer to bedside commode with cues for correct sequencing of RW and safest hand placement.  ADL Comments: Pt. reports pain level 6/10 and very tearful throughout session. Pt. transfered to bedside commode with in assist and verbal cues for sequencing. Pt. sat in chair to complete grooming tasks with setup due  to pain. Will address walk in shower transfer and LB dressing in next session.      OT Diagnosis: Generalized weakness;Acute pain  OT Problem List: Decreased activity tolerance;Decreased safety awareness;Decreased knowledge of use of DME or AE;Pain OT Treatment Interventions: Self-care/ADL training;Therapeutic exercise;DME and/or AE instruction;Patient/family education   OT Goals Acute Rehab OT Goals OT Goal Formulation: With patient Time For Goal Achievement: 06/18/12 Potential to Achieve Goals: Good ADL Goals Pt Will Perform Grooming: with modified independence;Standing at sink ADL Goal: Grooming - Progress: Goal set today Pt Will Perform Lower Body Dressing: with modified independence;Sit to stand from chair ADL Goal: Lower Body Dressing - Progress: Goal set today Pt Will Perform Tub/Shower Transfer: Shower transfer;with supervision;Ambulation ADL Goal: Tub/Shower Transfer - Progress: Goal set today  Visit Information  Last OT Received On: 06/05/12    Subjective Data  Subjective: I had a really bad night last night Patient Stated Goal: To get back to where I was before   Prior Functioning     Home Living Lives With: Spouse;Family Type of Home: House Home Access: Stairs to enter [employer] of Steps: 3-4 Entrance Stairs-Rails: Can reach both Home Layout: One level Bathroom Shower/Tub: Health visitor: Standard Bathroom Accessibility: Yes How Accessible: Accessible via walker Home Adaptive Equipment: Feeding equipment;Straight cane;Walker - standard;Bedside commode/3-in-1 Prior Function Level of Independence: Independent with assistive device(s) Able to Take Stairs?: Yes Driving: Yes Vocation: Unemployed Communication Communication: No difficulties Dominant Hand: Right         Vision/Perception     Cognition  Overall Cognitive Status: Appears within functional limits for tasks assessed/performed Arousal/Alertness:  Awake/alert Orientation Level: Appears intact for tasks assessed Behavior During Session: Anxious    Extremity/Trunk Assessment Right Upper Extremity Assessment RUE ROM/Strength/Tone: [facility] for tasks assessed Left Upper  Extremity Assessment LUE ROM/Strength/Tone: [facility] for tasks assessed     Mobility Bed Mobility Bed Mobility: Supine to Sit;Sitting - Scoot to Edge of Bed Supine to Sit: With rails;4: Min guard ( Use of leg lifter and (A) with supporting LLE ) Sitting - Scoot to Edge of Bed: 4: Min guard ((A) with LLE support) Details for Bed Mobility Assistance: Pt. required cueing for proper technique and use of leg lifter. (A) with supporting LLE  Transfers Transfers: Sit to Stand;Stand to Sit Sit to Stand: 4: Min assist;From bed;From chair/3-in-1;With upper extremity assist;From elevated surface Stand to Sit: 4: Min assist;With upper extremity assist;With armrests;To chair/3-in-1 Details for Transfer Assistance: Needs cues to keep LLE out in front and for safest hand placement                     End of Session OT - End of Session Equipment Utilized During Treatment: Gait belt;Left knee immobilizer Activity Tolerance: Patient limited by pain (Very anxious ) Patient left: in chair;with call bell/phone within reach Nurse Communication: Patient requests pain meds;Weight bearing status;Mobility status  GO     [name] 06/05/2012, 8:52 AM

## 2012-06-05 NOTE — Progress Notes (Signed)
UR COMPLETED  

## 2012-06-05 NOTE — Progress Notes (Signed)
Nutrition Brief Note  Patient identified on the Malnutrition Screening Tool (MST) report for wt loss, generating a score of 2.   Body mass index is 39.83 kg/(m^2). Pt meets criteria for Obese based on current BMI.   Current diet order is CHO Modified Medium, patient is consuming approximately 50-75% of meals at this time. Labs and medications reviewed.   Pt states her wt loss was intentional.  She has lost 6 lbs over the last 3 months by cutting back on sweets and portions.  Pt requests a sample meal plan which is provided through "Nutrition Therapy for Diabetes" handout from the AND.  Pt states no education needed, just the meal plan.  RD contact information provided for additional questions.  No nutrition interventions warranted at this time. If nutrition issues arise, please consult RD.   Loyce Dys, MS RD LDN Clinical Inpatient Dietitian Pager: [phone] Weekend/After hours pager: [phone]

## 2012-06-05 NOTE — Progress Notes (Signed)
Physical Therapy Treatment Patient Details Name: Kayla [name] MRN: [id number] DOB: [birth year] Today's Date: 06/05/2012 Time: 7829-5621 PT Time Calculation (min): 26 min  PT Assessment / Plan / Recommendation Comments on Treatment Session  Progressing slowly. Pain control better for pm session but activity tolerance still limited.     Follow Up Recommendations  Home health PT;Supervision/Assistance - 24 hour    Barriers to Discharge        Equipment Recommendations  None recommended by PT    Recommendations for Other Services OT consult  Frequency 7X/week   Plan Discharge plan remains appropriate    Precautions / Restrictions Precautions Precautions: Knee Required Braces or Orthoses: Knee Immobilizer - Left Restrictions Weight Bearing Restrictions: No LLE Weight Bearing: Weight bearing as tolerated   Pertinent Vitals/Pain L knee 2/10    Mobility  Bed Mobility Bed Mobility: Supine to Sit;Sit to Supine Supine to Sit: 4: Min assist Sit to Supine: 4: Min assist Details for Bed Mobility Assistance: Assist for L LE off/onto bed.  Transfers Transfers: Sit to Stand;Stand to Sit Sit to Stand: 4: Min assist;From chair/3-in-1;From bed Stand to Sit: 4: Min guard;To bed;To chair/3-in-1 Details for Transfer Assistance: x 2. VCs safety, hand placement, technique. asssit to steady, control descent Ambulation/Gait Ambulation/Gait Assistance: 4: Min assist Ambulation Distance (Feet): 75 Feet Assistive device: Rolling walker Ambulation/Gait Assistance Details: Assist to steady throughout ambulation. VCs safety, sequence, technique.  Gait Pattern: Antalgic;Step-to pattern;Decreased stride length    Exercises Total Joint Exercises Ankle Circles/Pumps: AROM;Both;10 reps;Supine Quad Sets: AROM;Both;10 reps;Supine;Limitations Quad Sets Limitations: poor quad activation Short Arc Quad: AAROM;Left;10 reps;Supine;Limitations Short Arc Quad Limitations: poor quad activation Heel  Slides: AAROM;Left;10 reps;Supine Hip ABduction/ADduction: AAROM;Left;10 reps;Supine Straight Leg Raises: AAROM;Left;10 reps;Supine   PT Diagnosis:    PT Problem List:   PT Treatment Interventions:     PT Goals Acute Rehab PT Goals Pt will go Supine/Side to Sit: with supervision PT Goal: Supine/Side to Sit - Progress: Progressing toward goal Pt will go Sit to Supine/Side: with supervision PT Goal: Sit to Supine/Side - Progress: Progressing toward goal Pt will go Sit to Stand: with supervision PT Goal: Sit to Stand - Progress: Progressing toward goal Pt will Ambulate: 51 - 150 feet;with supervision;with rolling walker PT Goal: Ambulate - Progress: Progressing toward goal Pt will Perform Home Exercise Program: with supervision, verbal cues required/provided PT Goal: Perform Home Exercise Program - Progress: Progressing toward goal  Visit Information  Last PT Received On: 06/05/12 Assistance Needed: +1    Subjective Data  Subjective: "I'm much better" Patient Stated Goal: Less pain.    Cognition  Overall Cognitive Status: Appears within functional limits for tasks assessed/performed Arousal/Alertness: Awake/alert Orientation Level: Appears intact for tasks assessed Behavior During Session: [facility] for tasks performed    Balance     End of Session PT - End of Session Equipment Utilized During Treatment: Gait belt;Left knee immobilizer Activity Tolerance: Patient limited by pain;Patient limited by fatigue Patient left: in bed;with call bell/phone within reach   GP     Rebeca Alert [facility] 06/05/2012, 2:03 PM [phone]

## 2012-06-05 NOTE — Progress Notes (Signed)
Pt assisted to sit edge of bed for 20 minutes, tolerated well but very drowsy. Pt instructed to deep breathe and use incentive. CO2 level decreased to mid 60's. Pt becoming more alert, c/o of increased pain at this time. Mellody Dance, PA contacted again, received order for Vicodin 5/325 q6h for now until CO2 decreases more and becomes more alert. Resting comfortably in bed. Will continue to monitor.

## 2012-06-05 NOTE — Op Note (Signed)
NAME:  Kayla [name], Kayla [name] NO.:  [id number]  MEDICAL RECORD NO.:  [id number]  LOCATION:  5N01C                        FACILITY:  [facility]  PHYSICIAN:  Loreta Ave, M.D. DATE OF BIRTH:  Jul 30, 1961  DATE OF PROCEDURE:  06/04/2012 DATE OF DISCHARGE:                              OPERATIVE REPORT   PREOPERATIVE DIAGNOSIS:  Left knee end-stage degenerative arthritis, varus alignment.  POSTOPERATIVE DIAGNOSIS:  Left knee end-stage degenerative arthritis, varus alignment.  PROCEDURE:  Left knee modified minimally invasive total knee replacement with Stryker Triathlon prosthesis.  Soft tissue balancing.  Cemented pegged posterior stabilized #4 femoral component.  Cemented #4 tibial component, 9-mm polyethylene insert.  Cemented resurfacing 35 mm patellar component.  SURGEON:  Loreta Ave, M.D.  ASSISTANT:  Genene Churn. Barry Dienes, [place], present throughout the entire case, necessary for timely completion of procedure.  ANESTHESIA:  General.  BLOOD LOSS:  Minimal.  SPECIMENS:  None.  CULTURES:  None.  COMPLICATIONS:  None.  DRESSING:  Soft compressive with knee immobilizer.  DRAINS:  Hemovac x1.  TOURNIQUET TIME:  1 hour.  PROCEDURE:  The patient was brought to the operating room and placed on the operating table in supine position.  After adequate anesthesia had been obtained, tourniquet applied, prepped and draped in usual sterile fashion.  A 5 degrees of varus partially correctable.  Almost full extension.  Straight incision above the patella down to the tibial tubercle.  Medial arthrotomy, vastus splitting, preserving quad tendon. Hemostasis with cautery.  Medial capsule released.  Distal femur exposed.  Intramedullary guide placed.  An 8-mm resection, 5 degrees of valgus.  Using epicondylar axis, the femur was sized, cut, and fitted for a posterior stabilized pegged #4 component.  Proximal tibial resection below the defect medially.  Extramedullary guide,  3-degree posterior slope cut.  Size #4 component.  Patella exposed, posterior 10- mm removed; drilled, sized, and fitted for a 35-mm component.  Debris cleared out in flexion and extension.  Trials put in place.  A #4 on the femur, 4 on the tibia, a 9-mm insert, 35 patella.  With this construct, nicely balanced knee, good biomechanical axis.  Excellent patellofemoral tracking.  Full extension and flexion.  Tibia was marked for rotation and hand reamed.  All trials removed.  Copious irrigation with a pulse irrigating device.  Cement prepared, placed on all components, firmly seated.  Polyethylene attached to tibia, knee reduced.  Patella held with a clamp.  Once the cement hardened, knee was irrigated once again. Hemovac placed.  Arthrotomy closed with #1 Vicryl.  Skin and subcutaneous tissue with Vicryl and staples.  Sterile compressive dressing applied.  Tourniquet deflated and removed.  Knee immobilizer applied.  Anesthesia reversed.  Brought to the recovery room.  Tolerated the surgery well.  No complications.     Loreta Ave, M.D.     DFM/MEDQ  D:  06/04/2012  T:  06/05/2012  Job:  [id number]

## 2012-06-05 NOTE — Progress Notes (Signed)
I agree with the following treatment note after reviewing documentation.   Johnston, Jaley Yan Brynn   OTR/L Pager: [phone] Office: [phone] .   

## 2012-06-06 LAB — BASIC METABOLIC PANEL
BUN: 7 mg/dL (ref 6–23)
CO2: 29 mEq/L (ref 19–32)
Chloride: 99 mEq/L (ref 96–112)
GFR calc Af Amer: >90 mL/min (ref >90)
Potassium: 4 mEq/L (ref 3.5–5.1)

## 2012-06-06 LAB — GLUCOSE, CAPILLARY: Glucose-Capillary: 124 mg/dL — ABNORMAL HIGH (ref 70–99)

## 2012-06-06 LAB — PROTIME-INR: Prothrombin Time: 22 seconds — ABNORMAL HIGH (ref 11.6–15.2)

## 2012-06-06 LAB — CBC
HCT: 33 % — ABNORMAL LOW (ref 36.0–46.0)
Hemoglobin: 11 g/dL — ABNORMAL LOW (ref 12.0–15.0)
MCH: 27.8 pg (ref 26.0–34.0)
MCV: 83.5 fL (ref 78.0–100.0)
RBC: 3.95 MIL/uL (ref 3.87–5.11)

## 2012-06-06 MED ORDER — METHOCARBAMOL 500 MG PO TABS: 500.0000 mg | ORAL_TABLET | Freq: Four times a day (QID) | ORAL | Status: DC | PRN

## 2012-06-06 MED ORDER — WARFARIN SODIUM 5 MG PO TABS: 5.0000 mg | ORAL_TABLET | Freq: Every day | ORAL | Status: DC

## 2012-06-06 MED ORDER — BISACODYL 10 MG RE SUPP
10.0000 mg | Freq: Once | RECTAL | Status: AC
  Administered 2012-06-06: 10 mg via RECTAL
  Filled 2012-06-06: qty 1

## 2012-06-06 MED ORDER — OXYCODONE-ACETAMINOPHEN 10-325 MG PO TABS: 1.0000 | ORAL_TABLET | ORAL | Status: DC | PRN

## 2012-06-06 NOTE — Progress Notes (Signed)
Occupational Therapy Discharge Patient Details Name: NATALIAH HATLESTAD MRN: [id number] DOB: [birth year] Today's Date: 06/06/2012 Time: 1046-1100 OT Time Calculation (min): 14 min  Patient discharged from OT services secondary to goals met and no further OT needs identified.  Please see latest therapy progress note for current level of functioning and progress toward goals.    Progress and discharge plan discussed with patient and/or caregiver: Patient/Caregiver agrees with plan  GO     [name] 06/06/2012, 11:20 AM

## 2012-06-06 NOTE — Progress Notes (Signed)
ANTICOAGULATION CONSULT - COUMADIN  Pharmacy Consult for Coumadin  HPI: 51 y.o.female admitted for DJD LEFT KNEE now s/p L-TKA who is currently on Day # 3 of Coumadin with Day # 2 Lovenox bridging for VTE prophylaxis .     Allergies: Allergies  Allergen Reactions  . Morphine And Related Itching    Height/Weight: Height: 5\' 3"  (160 cm) Weight: 224 lb 13.9 oz (102 kg) IBW/kg (Calculated) : 52.4   Vitals: Blood pressure 120/68, pulse 104, temperature 99.6 F (37.6 C), temperature source Oral, resp. rate 14, height 5\' 3"  (1.6 m), weight 224 lb 13.9 oz (102 kg), SpO2 94.00%.  Medical / Surgical History: Past Medical History  Diagnosis Date  . Depressive disorder, not elsewhere classified   . Other and unspecified hyperlipidemia   . Mitral valve prolapse   . Unspecified essential hypertension     fast heart rate     dr Oswaldo Milian  . Heart murmur   . Anginal pain   . Exertional dyspnea     "sometimes" (06/05/2012)  . OSA on CPAP   . Type II diabetes mellitus   . Anemia   . Factor II deficiency     "prothrombin gene factor II" (06/05/2012)  . Daily headache     "lately" (06/05/2012)  . Osteoarthritis     "knees; shoulders; base of thumbs" (06/05/2012)  . PTSD (post-traumatic stress disorder)    Past Surgical History  Procedure Date  . Vesicovaginal fistula closure w/ tah 1980's    w/bladder tacking  . Eye surgery     lasik bilateral   . Knee arthroscopy w/ meniscal repair     "both"  . Total knee arthroplasty 02/06/2012    Procedure: TOTAL KNEE ARTHROPLASTY;  Surgeon: Loreta Ave, MD;  Location[facility] OR;  Service: Orthopedics;  Laterality: Right;  total knee right side  . Total knee arthroplasty 06/04/2012    left  . Shoulder arthroscopy w/ rotator cuff repair ~ 2004    left  . Vaginal hysterectomy 1980's  . Total knee arthroplasty 06/04/2012    Procedure: TOTAL KNEE ARTHROPLASTY;  Surgeon: Loreta Ave, MD;  Location: [facility] OR;  Service: Orthopedics;  Laterality:  Left;    Current Labs:   Basename 06/06/12 0815 06/06/12 0722 06/05/12 0640  HGB -- 11.0* 11.8*  HCT -- 33.0* 34.9*  PLT -- 209 243  LABPROT -- 22.0* 13.4  INR -- 2.01* 1.03  CREATININE 0.77 -- 0.73    Lab Results  Component Value Date   INR 2.01* 06/06/2012   INR 1.03 06/05/2012   INR 0.97 05/28/2012   Estimated Creatinine Clearance: 92.7 ml/min (by C-G formula based on Cr of 0.77).  Pertinent Medication History: Medication Sig  . atorvastatin (LIPITOR) 20 MG tablet Take 20 mg by mouth daily.  Marland Kitchen HYDROcodone-acetaminophen (NORCO/VICODIN) 5-325 MG per tablet Take 1 tablet by mouth 2 (two) times daily as needed. For pain.  Marland Kitchen LORazepam (ATIVAN) 0.5 MG tablet Take 0.5 mg by mouth 2 (two) times daily as needed. For anxiety  . meloxicam (MOBIC) 7.5 MG tablet Take 7.5 mg by mouth daily.  . metFORMIN (GLUCOPHAGE-XR) 500 MG 24 hr tablet Take 1,000 mg by mouth daily with breakfast.  . oxybutynin (DITROPAN XL) 15 MG 24 hr tablet Take 15 mg by mouth daily.  . sertraline (ZOLOFT) 100 MG tablet Take 100 mg by mouth daily.  Marland Kitchen zolpidem (AMBIEN) 10 MG tablet Take 10 mg by mouth at bedtime.    Scheduled:    .  atorvastatin  20 mg Oral Daily  . bisacodyl  10 mg Rectal Once  . coumadin book   Does not apply Once  . docusate sodium  100 mg Oral BID  . enoxaparin (LOVENOX) injection  30 mg Subcutaneous Q12H  . insulin aspart  0-15 Units Subcutaneous TID WC  . metFORMIN  1,000 mg Oral Q breakfast  . oxybutynin  15 mg Oral Daily  . pneumococcal 23 valent vaccine  0.5 mL Intramuscular Tomorrow-1000  . sertraline  100 mg Oral Daily  . warfarin  7.5 mg Oral ONCE-1800  . warfarin   Does not apply Once  . Warfarin - Pharmacist Dosing Inpatient   Does not apply q1800   Anti-infectives     Start     Dose/Rate Route Frequency Ordered Stop   06/04/12 1530   ceFAZolin (ANCEF) IVPB 1 g/50 mL premix        1 g 100 mL/hr over 30 Minutes Intravenous 3 times per day 06/04/12 1517 06/05/12 0622    06/03/12 1415   ceFAZolin (ANCEF) IVPB 2 g/50 mL premix  Status:  Discontinued        2 g 100 mL/hr over 30 Minutes Intravenous 60 min pre-op 06/03/12 1415 06/04/12 1438          Assessment:  Large increase overnight of INR following only 2 doses of Coumadin 7.5 mg.  INR 1.03  > 2.01.  No drug interactions identified.  I expect the INR to continue to rise without additional Coumadin given the patient's rapid INR response.  No complications noted.  Despite INR of 2.01, patient will not be fully anticoagulated by Coumadin until after 5 days [takes 5 days to counteract all Vit K dependent clotting factors].  Until then, in high risk patients, Lovenox should be continued.  Goals:  Target INR of 2-3.  Plan:  Will hold Coumadin today.  Will continue Lovenox 1-2 more days [as above].    Daily INR's, CBC.  Aqueelah Cotrell, Elisha Headland, Pharm. D. 06/06/2012, 12:10 PM

## 2012-06-06 NOTE — Progress Notes (Signed)
I agree with the following treatment note after reviewing documentation.   Johnston, Jacquelina Hewins Brynn   OTR/L Pager: [phone] Office: [phone] .   

## 2012-06-06 NOTE — Progress Notes (Signed)
Occupational Therapy Treatment Patient Details Name: Kayla [name] MRN: [id number] DOB: Mar 06, 1961 Today's Date: 06/06/2012 Time: 1046-1100 OT Time Calculation (min): 14 min  OT Assessment / Plan / Recommendation Comments on Treatment Session Pt. tolerated treatment very well today stating her pain was a 3/10 and feeling much better than the previous day. Pt. is modified independent to complete ADL tasks. Pt was educated and able to demonstrate use of a sock aid to (A) with donning LLE sock.    Follow Up Recommendations  Supervision/Assistance - 24 hour    Barriers to Discharge       Equipment Recommendations  None recommended by OT    Recommendations for Other Services    Frequency Min 2X/week   Plan All goals met and education completed, patient discharged from OT services    Precautions / Restrictions Precautions Precautions: Knee Required Braces or Orthoses: Knee Immobilizer - Left Restrictions Weight Bearing Restrictions: Yes LLE Weight Bearing: Weight bearing as tolerated   Pertinent Vitals/Pain Pt. Reports pain 3/10    ADL  Grooming: Wash/dry hands;Teeth care;Performed;Modified independent Where Assessed - Grooming: Unsupported standing Lower Body Bathing: Simulated;Supervision/safety Where Assessed - Lower Body Bathing: Unsupported sitting Lower Body Dressing: Performed;Modified independent Where Assessed - Lower Body Dressing: Supported sit to Pharmacist, hospital: Buyer, retail Method: Sit to Barista: Raised toilet seat with arms (or 3-in-1 over toilet) Toileting - Clothing Manipulation and Hygiene: Performed;Independent Where Assessed - Toileting Clothing Manipulation and Hygiene: Standing Tub/Shower Transfer: Landscape architect Method: Science writer: Walk in Scientist, research (physical sciences) Used: Rolling walker;Gait belt;Knee  Immobilizer Transfers/Ambulation Related to ADLs: Pt. able to ambulate with RW using correct sequencing. Pt. educated and demonstrated a walk in shower transfer using RW. ADL Comments: Pt. up in chair upon arrival and feeling much better today, now that pain is under control. Pt. completed basic ADL's with modified independence and supervision for safety with shower transfers. Pt. stated she is "ready to go home"    OT Diagnosis:    OT Problem List:   OT Treatment Interventions:     OT Goals Acute Rehab OT Goals OT Goal Formulation: With patient Time For Goal Achievement: 06/18/12 Potential to Achieve Goals: Good ADL Goals Pt Will Perform Grooming: with modified independence;Standing at sink ADL Goal: Grooming - Progress: Met Pt Will Perform Lower Body Dressing: with modified independence;Sit to stand from chair ADL Goal: Lower Body Dressing - Progress: Met Pt Will Perform Tub/Shower Transfer: Shower transfer;with supervision;Ambulation ADL Goal: Tub/Shower Transfer - Progress: Met  Visit Information  Last OT Received On: 06/06/12 Assistance Needed: +1    Subjective Data      Prior Functioning       Cognition  Overall Cognitive Status: Appears within functional limits for tasks assessed/performed Arousal/Alertness: Awake/alert Orientation Level: Appears intact for tasks assessed Behavior During Session: [facility] for tasks performed    Mobility  Bed Mobility Bed Mobility: Not assessed Transfers Transfers: Sit to Stand;Stand to Sit Sit to Stand: 5: Supervision;With upper extremity assist;From chair/3-in-1;With armrests Stand to Sit: 5: Supervision;To chair/3-in-1;With armrests;Without upper extremity assist                 End of Session OT - End of Session Equipment Utilized During Treatment: Gait belt;Left knee immobilizer Activity Tolerance: Patient tolerated treatment well Patient left: in chair;with call bell/phone within reach Nurse Communication: Mobility status   GO     Kayla [name] 06/06/2012, 11:19 AM

## 2012-06-06 NOTE — Progress Notes (Signed)
Physical Therapy Treatment Patient Details Name: Kayla [name] MRN: [id number] DOB: July 12, 1961 Today's Date: 06/06/2012 Time: 4098-1191 PT Time Calculation (min): 19 min  PT Assessment / Plan / Recommendation Comments on Treatment Session  Patient making steady progress towards PT goals.  Patient able to increase activty today and performed stair and curb negotiation safely.  Feel patient is safe for discharge home with home Pt.    Follow Up Recommendations  Home health PT    Barriers to Discharge        Equipment Recommendations  None recommended by OT    Recommendations for Other Services    Frequency 7X/week   Plan Discharge plan remains appropriate    Precautions / Restrictions Precautions Precautions: Knee Required Braces or Orthoses: Knee Immobilizer - Left Restrictions Weight Bearing Restrictions: Yes LLE Weight Bearing: Weight bearing as tolerated   Pertinent Vitals/Pain 5/10    Mobility  Bed Mobility Bed Mobility: Not assessed Supine to Sit: 5: Supervision Sitting - Scoot to Edge of Bed: 5: Supervision Transfers Transfers: Sit to Stand;Stand to Sit Sit to Stand: 5: Supervision;With upper extremity assist;From chair/3-in-1;With armrests Stand to Sit: 5: Supervision;To chair/3-in-1;With armrests;Without upper extremity assist Details for Transfer Assistance: performed 4x successfully without VCs required  Ambulation/Gait Ambulation/Gait Assistance: 5: Supervision Ambulation Distance (Feet): 120 Feet Assistive device: Rolling walker Ambulation/Gait Assistance Details: Patient steady with ambulation VC's to look up Gait Pattern: Step-through pattern;Decreased stride length Gait velocity: decreased General Gait Details: Patient able to tolerated additional ambulation distance today  Stairs: Yes Stairs Assistance: 4: Min assist;4: Min guard Stairs Assistance Details (indicate cue type and reason): Min assist to stabilize rw Stair Management Technique:  Backwards;With walker;No rails (also performed with bilateral rails and now RW, fwd method) Number of Stairs: 12  (3 steps performed x4.  Both fwd and backward technique used.)      PT Goals Acute Rehab PT Goals Pt will go Supine/Side to Sit: with supervision PT Goal: Supine/Side to Sit - Progress: Met Pt will go Sit to Supine/Side: with supervision PT Goal: Sit to Supine/Side - Progress: Met Pt will go Sit to Stand: with supervision PT Goal: Sit to Stand - Progress: Met Pt will Ambulate: 51 - 150 feet;with supervision;with rolling walker PT Goal: Ambulate - Progress: Progressing toward goal Pt will Go Up / Down Stairs: 3-5 stairs;with rail(s);with least restrictive assistive device;with min assist PT Goal: Up/Down Stairs - Progress: Met  Visit Information  Last PT Received On: 06/06/12 Assistance Needed: +1    Subjective Data  Subjective: I feel pretty good Patient Stated Goal: To go home   Cognition  Overall Cognitive Status: Appears within functional limits for tasks assessed/performed Arousal/Alertness: Awake/alert Orientation Level: Appears intact for tasks assessed Behavior During Session: [facility] for tasks performed    Balance     End of Session PT - End of Session Equipment Utilized During Treatment: Gait belt;Left knee immobilizer Activity Tolerance: Patient tolerated treatment well Patient left: in chair;with call bell/phone within reach Nurse Communication: Mobility status   GP     Fabio Asa 06/06/2012, 12:29 PM Charlotte Crumb, PT DPT  [phone]

## 2012-06-06 NOTE — Progress Notes (Signed)
Subjective: Doing well.  No complaints.  Ready to go home.   Objective: Vital signs in last 24 hours: Temp:  [99.1 F (37.3 C)-99.6 F (37.6 C)] 99.6 F (37.6 C) (09/27 0900) Pulse Rate:  [71-104] 104  (09/27 0900) Resp:  [14-18] 18  (09/27 1200) BP: (120-145)/(55-73) 120/68 mmHg (09/27 0900) SpO2:  [94 %] 94 % (09/27 0441)  Intake/Output from previous day: 09/26 0701 - 09/27 0700 In: 2373 [P.O.:1080; I.V.:1293] Out: 385 [Urine:350; Drains:35] Intake/Output this shift:     Basename 06/06/12 0722 06/05/12 0640  HGB 11.0* 11.8*    Basename 06/06/12 0722 06/05/12 0640  WBC 8.5 10.6*  RBC 3.95 4.21  HCT 33.0* 34.9*  PLT 209 243    Basename 06/06/12 0815 06/05/12 0640  NA 136 132*  K 4.0 4.0  CL 99 95*  CO2 29 27  BUN 7 8  CREATININE 0.77 0.73  GLUCOSE 159* 185*  CALCIUM 9.1 9.3    Basename 06/06/12 0722 06/05/12 0640  LABPT -- --  INR 2.01* 1.03    Exam:  Wound looks good.  Staples intact.  No signs of infection.  Calf nt, nvi.  Drain removed.  Assessment/Plan: D/c home today.     Kaiea Esselman M 06/06/2012, 2:17 PM

## 2012-06-06 NOTE — Progress Notes (Signed)
PT PROGRESS NOTE   06/06/12 1400  PT Visit Information  Last PT Received On 06/06/12  Assistance Needed +1  PT Time Calculation  PT Start Time 1401  PT Stop Time 1415  PT Time Calculation (min) 14 min  Subjective Data  Subjective Just about ready to get out of here  Patient Stated Goal To go home  Precautions  Precautions Knee  Required Braces or Orthoses Knee Immobilizer - Left  Restrictions  Weight Bearing Restrictions Yes  LLE Weight Bearing WBAT  Cognition  Overall Cognitive Status Appears within functional limits for tasks assessed/performed  Arousal/Alertness Awake/alert  Orientation Level Appears intact for tasks assessed  Behavior During Session [facility] for tasks performed  PT - Assessment/Plan  Comments on Treatment Session Met with patient  and spouse to perform education for car transfer and expectations for mobility upon discharge.  Reviewed HEP and protocol for CPM with patient and provided visual demonstration on technique for in/out of car.  Patient states that she has no further questions or concerns for discharge from a mobility standpoint.  Anticipate patient will be safe for discharge home with home PT. Patient provided with hand out for HEP.  PT Plan Discharge plan remains appropriate  PT Frequency 7X/week  Follow Up Recommendations Home health PT  PT General Charges  $$ ACUTE PT VISIT 1 Procedure  PT Treatments  $Self Care/Home Management 8-22   Charlotte Crumb, [place] DPT  [phone]

## 2012-06-06 NOTE — Care Management Note (Signed)
    Page 1 of 1   06/06/2012     10:54:05 AM   CARE MANAGEMENT NOTE 06/06/2012  Patient:  Kayla [name], Kayla [name]   Account Number:  [id number]  Date Initiated:  06/06/2012  Documentation initiated by:  Anette Guarneri  Subjective/Objective Assessment:   POD#2 s/p left TKA  Pre-arranged [facility] services wiht [facility]  has DME,     Action/Plan:   home with [facility] services   Anticipated DC Date:  06/06/2012   Anticipated DC Plan:  HOME W HOME HEALTH SERVICES      DC Planning Services  CM consult      Choice offered to / List presented to:             Status of service:  Completed, signed off Medicare Important Message given?  NO (If response is "NO", the following Medicare IM given date fields will be blank) Date Medicare IM given:   Date Additional Medicare IM given:    Discharge Disposition:  HOME W HOME HEALTH SERVICES  Per UR Regulation:  Reviewed for med. necessity/level of care/duration of stay  If discussed at Long Length of Stay Meetings, dates discussed:    Comments:  HH services pre-arranged by MD office, HH with [facility] has DME, CPM to be delivered to home.

## 2012-06-06 NOTE — Progress Notes (Signed)
I agree with the following treatment note after reviewing documentation.   Johnston, Grayling Schranz Brynn   OTR/L Pager: [phone] Office: [phone] .   

## 2012-06-11 NOTE — Discharge Summary (Signed)
  ABBREVIATED DISCHARGE SUMMARY      DATE OF HOSPITALIZATION:  04 Jun 2012  REASON FOR HOSPITALIZATION:  51 yo wf with hx of end stage djd left knee and chronic pain.  Failed conservative treatment.      SIGNIFICANT FINDINGS:  DJD  OPERATION:  Left total knee replacement  FINAL DIAGNOSIS:  same  SECONDARY DIAGNOSIS: none  CONSULTANTS:  none  DISCHARGE CONDITION:  STABLE  DISCHARGED TO:  HOME

## 2012-06-26 ENCOUNTER — Other Ambulatory Visit: Payer: Self-pay | Admitting: Internal Medicine

## 2012-08-11 ENCOUNTER — Other Ambulatory Visit: Payer: Self-pay | Admitting: Internal Medicine

## 2012-08-28 ENCOUNTER — Ambulatory Visit (INDEPENDENT_AMBULATORY_CARE_PROVIDER_SITE_OTHER): Payer: BC Managed Care – PPO | Admitting: Internal Medicine

## 2012-08-28 ENCOUNTER — Encounter: Payer: Self-pay | Admitting: Internal Medicine

## 2012-08-28 VITALS — BP 110/70 | HR 98 | Temp 98.2°F | Resp 18 | Wt 216.0 lb

## 2012-08-28 DIAGNOSIS — E785 Hyperlipidemia, unspecified: Secondary | ICD-10-CM

## 2012-08-28 DIAGNOSIS — F3289 Other specified depressive episodes: Secondary | ICD-10-CM

## 2012-08-28 DIAGNOSIS — I10 Essential (primary) hypertension: Secondary | ICD-10-CM

## 2012-08-28 DIAGNOSIS — E119 Type 2 diabetes mellitus without complications: Secondary | ICD-10-CM

## 2012-08-28 DIAGNOSIS — M1990 Unspecified osteoarthritis, unspecified site: Secondary | ICD-10-CM

## 2012-08-28 DIAGNOSIS — F329 Major depressive disorder, single episode, unspecified: Secondary | ICD-10-CM

## 2012-08-28 LAB — HEMOGLOBIN A1C: Hgb A1c MFr Bld: 6.6 % — ABNORMAL HIGH (ref 4.6–6.5)

## 2012-08-28 MED ORDER — LORAZEPAM 0.5 MG PO TABS: 0.5000 mg | ORAL_TABLET | Freq: Three times a day (TID) | ORAL | Status: DC | PRN

## 2012-08-28 MED ORDER — METHOCARBAMOL 500 MG PO TABS: 500.0000 mg | ORAL_TABLET | Freq: Four times a day (QID) | ORAL | Status: DC | PRN

## 2012-08-28 MED ORDER — ATORVASTATIN CALCIUM 20 MG PO TABS: 20.0000 mg | ORAL_TABLET | Freq: Every day | ORAL | Status: DC

## 2012-08-28 MED ORDER — OXYBUTYNIN CHLORIDE ER 15 MG PO TB24: 15.0000 mg | ORAL_TABLET | Freq: Every day | ORAL | Status: DC

## 2012-08-28 MED ORDER — SERTRALINE HCL 100 MG PO TABS: 100.0000 mg | ORAL_TABLET | Freq: Every day | ORAL | Status: DC

## 2012-08-28 MED ORDER — METFORMIN HCL ER 500 MG PO TB24: 1000.0000 mg | ORAL_TABLET | Freq: Every day | ORAL | Status: DC

## 2012-08-28 NOTE — Progress Notes (Signed)
Subjective:    Patient ID: Kayla [name], female    DOB: [birth year], 51 y.o.   MRN: [id number]  HPI  51 year old patient who is seen today for followup. She has a history of type 2 diabetes which has been controlled on metformin therapy only no recent hemoglobin A1c. 6 months ago and the A1c 7.1. Since the last visit here she has had left total knee replacement surgery. Today she complains of some mild left hip pain and some mild swelling of the left foot. She has osteoarthritis dyslipidemia and a history depression which has been stable. She requires a medication refill  Past Medical History  Diagnosis Date  . Depressive disorder, not elsewhere classified   . Other and unspecified hyperlipidemia   . Mitral valve prolapse   . Unspecified essential hypertension     fast heart rate     dr Oswaldo Milian  . Heart murmur   . Anginal pain   . Exertional dyspnea     "sometimes" (06/05/2012)  . OSA on CPAP   . Type II diabetes mellitus   . Anemia   . Factor II deficiency     "prothrombin gene factor II" (06/05/2012)  . Daily headache     "lately" (06/05/2012)  . Osteoarthritis     "knees; shoulders; base of thumbs" (06/05/2012)  . PTSD (post-traumatic stress disorder)     History   Social History  . Marital Status: Married    Spouse Name: N/A    Number of Children: 2  . Years of Education: N/A   Occupational History  . Chiropodist    Social History Main Topics  . Smoking status: Never Smoker   . Smokeless tobacco: Never Used  . Alcohol Use: No  . Drug Use: No  . Sexually Active: Yes   Other Topics Concern  . Not on file   Social History Narrative  . No narrative on file    Past Surgical History  Procedure Date  . Vesicovaginal fistula closure w/ tah 1980's    w/bladder tacking  . Eye surgery     lasik bilateral   . Knee arthroscopy w/ meniscal repair     "both"  . Total knee arthroplasty 02/06/2012    Procedure: TOTAL KNEE ARTHROPLASTY;  Surgeon: Loreta Ave, MD;  Location: [facility] OR;  Service: Orthopedics;  Laterality: Right;  total knee right side  . Total knee arthroplasty 06/04/2012    left  . Shoulder arthroscopy w/ rotator cuff repair ~ 2004    left  . Vaginal hysterectomy 1980's  . Total knee arthroplasty 06/04/2012    Procedure: TOTAL KNEE ARTHROPLASTY;  Surgeon: Loreta Ave, MD;  Location: [facility] OR;  Service: Orthopedics;  Laterality: Left;    Family History  Problem Relation Age of Onset  . Heart disease Father   . COPD Mother   . Heart disease Mother   . Heart attack Brother     Allergies  Allergen Reactions  . Morphine And Related Itching    Current Outpatient Prescriptions on File Prior to Visit  Medication Sig Dispense Refill  . atorvastatin (LIPITOR) 20 MG tablet Take 20 mg by mouth daily.      Marland Kitchen LORazepam (ATIVAN) 0.5 MG tablet TAKE ONE TABLET BY MOUTH TWICE DAILY AS NEEDED  60 tablet  1  . metFORMIN (GLUCOPHAGE-XR) 500 MG 24 hr tablet Take 1,000 mg by mouth daily with breakfast.      . methocarbamol (ROBAXIN) 500 MG tablet Take 1-2 tablets (500-1,000  mg total) by mouth every 6 (six) hours as needed (spasms).  60 tablet  0  . oxybutynin (DITROPAN XL) 15 MG 24 hr tablet TAKE ONE TABLET BY MOUTH EVERY DAY  90 tablet  0  . sertraline (ZOLOFT) 100 MG tablet Take 100 mg by mouth daily.      . sertraline (ZOLOFT) 100 MG tablet TAKE ONE TABLET BY MOUTH EVERY DAY  90 tablet  0  . oxyCODONE-acetaminophen (PERCOCET) 10-325 MG per tablet Take 1-2 tablets by mouth every 4 (four) hours as needed for pain.  60 tablet  0  . warfarin (COUMADIN) 5 MG tablet Take 1 tablet (5 mg total) by mouth daily.  60 tablet  0    BP 110/70  Pulse 98  Temp 98.2 F (36.8 C) (Oral)  Resp 18  Wt 216 lb (97.977 kg)  SpO2 95%       Review of Systems  Constitutional: Negative.   HENT: Negative for hearing loss, congestion, sore throat, rhinorrhea, dental problem, sinus pressure and tinnitus.   Eyes: Negative for pain, discharge and visual  disturbance.  Respiratory: Negative for cough and shortness of breath.   Cardiovascular: Negative for chest pain, palpitations and leg swelling.  Gastrointestinal: Negative for nausea, vomiting, abdominal pain, diarrhea, constipation, blood in stool and abdominal distention.  Genitourinary: Negative for dysuria, urgency, frequency, hematuria, flank pain, vaginal bleeding, vaginal discharge, difficulty urinating, vaginal pain and pelvic pain.  Musculoskeletal: Negative for joint swelling, arthralgias (Left hip pain) and gait problem.  Skin: Negative for rash.  Neurological: Negative for dizziness, syncope, speech difficulty, weakness, numbness and headaches.  Hematological: Negative for adenopathy.  Psychiatric/Behavioral: Negative for behavioral problems, dysphoric mood and agitation. The patient is not nervous/anxious.        Objective:   Physical Exam  Constitutional: She is oriented to person, place, and time. She appears well-developed and well-nourished.  HENT:  Head: Normocephalic.  Right Ear: External ear normal.  Left Ear: External ear normal.  Mouth/Throat: Oropharynx is clear and moist.  Eyes: Conjunctivae normal and EOM are normal. Pupils are equal, round, and reactive to light.  Neck: Normal range of motion. Neck supple. No thyromegaly present.  Cardiovascular: Normal rate, regular rhythm, normal heart sounds and intact distal pulses.   Pulmonary/Chest: Effort normal and breath sounds normal.  Abdominal: Soft. Bowel sounds are normal. She exhibits no mass. There is no tenderness.  Musculoskeletal: Normal range of motion.  Lymphadenopathy:    She has no cervical adenopathy.  Neurological: She is alert and oriented to person, place, and time.  Skin: Skin is warm and dry. No rash noted.  Psychiatric: She has a normal mood and affect. Her behavior is normal.          Assessment & Plan:   Diabetes mellitus. Will check a hemoglobin A1c. Additional weight loss  encouraged Hypertension stable Osteoarthritis  Recheck 4 months

## 2012-08-28 NOTE — Patient Instructions (Signed)
Take Aleve 200 mg twice daily for pain or swelling    It is important that you exercise regularly, at least 20 minutes 3 to 4 times per week.  If you develop chest pain or shortness of breath seek  medical attention.  You need to lose weight.  Consider a lower calorie diet and regular exercise.   Please check your hemoglobin A1c every 3 months  Please check your blood pressure on a regular basis.  If it is consistently greater than 150/90, please make an office appointment.

## 2012-10-11 DIAGNOSIS — G5601 Carpal tunnel syndrome, right upper limb: Secondary | ICD-10-CM

## 2012-10-11 HISTORY — DX: Carpal tunnel syndrome, right upper limb: G56.01

## 2012-10-20 ENCOUNTER — Encounter (HOSPITAL_BASED_OUTPATIENT_CLINIC_OR_DEPARTMENT_OTHER): Payer: Self-pay | Admitting: *Deleted

## 2012-10-20 ENCOUNTER — Encounter (HOSPITAL_BASED_OUTPATIENT_CLINIC_OR_DEPARTMENT_OTHER)
Admission: RE | Admit: 2012-10-20 | Discharge: 2012-10-20 | Disposition: A | Discharge status: Home / Self Care | Payer: BC Managed Care – PPO | Source: Ambulatory Visit | Attending: Orthopedic Surgery | Admitting: Orthopedic Surgery

## 2012-10-20 LAB — BASIC METABOLIC PANEL
BUN: 13 mg/dL (ref 6–23)
GFR calc Af Amer: 87 mL/min — ABNORMAL LOW (ref >90)
GFR calc non Af Amer: 75 mL/min — ABNORMAL LOW (ref >90)
Potassium: 4.4 mEq/L (ref 3.5–5.1)
Sodium: 141 mEq/L (ref 135–145)

## 2012-10-20 NOTE — Pre-Procedure Instructions (Signed)
To come for BMET 

## 2012-10-21 NOTE — Consult Note (Signed)
  Kayla [name]/[facility] [address]   [address] [place], [place] [address] [phone] A Division of [facility]  Loreta Ave, M.D.   Robert A. Thurston Hole, M.D.   Burnell Blanks, M.D.   Eulas Post, M.D.   Lunette Stands, M.D Buford Dresser, M.D.  Charlsie Quest, M.D.   Estell Harpin, M.D.   Melina Fiddler, M.D. Genene Churn. Barry Dienes, PA-C            Kirstin A. Shepperson, PA-C Josh [place], PA-C [place], [place]   RE: Kayla [name], Kayla [name]                                [id number]      DOB: 05-23-1961 PROGRESS NOTE: 07-15-12 [age] year-old white female who is about six weeks status post left total knee replacement.  Returns.  States that her knee is doing well.  Continues to improve with physical therapy.  Needs a refill of pain medication.  Last office visit we discussed bilateral carpal tunnel syndrome and she brings in NCV/EMG study report from March 12, 2006.  Test at that time showed bilateral carpal tunnel syndrome.  Right median nerve more involved and considered moderate to severe in degree.  Left side mild to moderate.  No evidence of ulnar neuropathy or polyneuropathy.  States that her right hand is worse than the left.  She does have bilateral grip weakness.  Progressively worsening numbness and tingling, right greater than left, thumb, index and long finger.  She would like to have surgery sometime around January if possible.  Patient states that she does occasionally have some neck pain.  She has never had cervical spine x-rays.    EXAMINATION: Pleasant white female, alert and oriented x 3 and in no acute distress.  Cervical spine good range of motion.  She has bilateral brachial plexus and trapezius tenderness.  Bilateral wrists good range of motion.  She has positive Tinel's and Phalen's.  Positive right thenar atrophy.  Decreased sensation to light touch right thumb, index and long finger.  Left knee range of motion  about 0-115+ degrees.  Some swelling.  Calf non-tender.  Neurovascularly intact.  Skin warm and dry.   X-RAYS: Cervical spine, AP and lateral views, show multilevel degenerative disc disease.  More narrowing at C5-6 and C6-7.  Left knee, AP, lateral and sunrise views, show good alignment and seating of her prosthesis.    DISPOSITION:  In regards to her left knee, she will continue physical therapy per protocol.  Follow up in six weeks for recheck.  For her hands recommend bilateral carpal tunnel releases and we will do the right one first.  Pre-op paperwork filled out.  She will call and let us know when she is ready to proceed with final scheduling.  All questions answered.  Refilled Oda Kilts, M.D.

## 2012-10-24 ENCOUNTER — Ambulatory Visit (HOSPITAL_BASED_OUTPATIENT_CLINIC_OR_DEPARTMENT_OTHER): Payer: BC Managed Care – PPO | Admitting: Certified Registered Nurse Anesthetist

## 2012-10-24 ENCOUNTER — Encounter (HOSPITAL_BASED_OUTPATIENT_CLINIC_OR_DEPARTMENT_OTHER)
Admission: RE | Disposition: A | Discharge status: Home / Self Care | Payer: Self-pay | Source: Ambulatory Visit | Attending: Orthopedic Surgery

## 2012-10-24 ENCOUNTER — Ambulatory Visit (HOSPITAL_BASED_OUTPATIENT_CLINIC_OR_DEPARTMENT_OTHER)
Admission: RE | Admit: 2012-10-24 | Discharge: 2012-10-24 | Disposition: A | Discharge status: Home / Self Care | Payer: BC Managed Care – PPO | Source: Ambulatory Visit | Attending: Orthopedic Surgery | Admitting: Orthopedic Surgery

## 2012-10-24 ENCOUNTER — Encounter (HOSPITAL_BASED_OUTPATIENT_CLINIC_OR_DEPARTMENT_OTHER): Payer: Self-pay | Admitting: Certified Registered Nurse Anesthetist

## 2012-10-24 ENCOUNTER — Encounter (HOSPITAL_BASED_OUTPATIENT_CLINIC_OR_DEPARTMENT_OTHER): Payer: Self-pay | Admitting: *Deleted

## 2012-10-24 DIAGNOSIS — R51 Headache: Secondary | ICD-10-CM | POA: Insufficient documentation

## 2012-10-24 DIAGNOSIS — Z01812 Encounter for preprocedural laboratory examination: Secondary | ICD-10-CM | POA: Insufficient documentation

## 2012-10-24 DIAGNOSIS — I10 Essential (primary) hypertension: Secondary | ICD-10-CM | POA: Insufficient documentation

## 2012-10-24 DIAGNOSIS — G5600 Carpal tunnel syndrome, unspecified upper limb: Secondary | ICD-10-CM | POA: Insufficient documentation

## 2012-10-24 DIAGNOSIS — Z96659 Presence of unspecified artificial knee joint: Secondary | ICD-10-CM | POA: Insufficient documentation

## 2012-10-24 DIAGNOSIS — R0602 Shortness of breath: Secondary | ICD-10-CM | POA: Insufficient documentation

## 2012-10-24 DIAGNOSIS — E119 Type 2 diabetes mellitus without complications: Secondary | ICD-10-CM | POA: Insufficient documentation

## 2012-10-24 DIAGNOSIS — F411 Generalized anxiety disorder: Secondary | ICD-10-CM | POA: Insufficient documentation

## 2012-10-24 DIAGNOSIS — G4730 Sleep apnea, unspecified: Secondary | ICD-10-CM | POA: Insufficient documentation

## 2012-10-24 HISTORY — DX: Adverse effect of unspecified anesthetic, initial encounter: T41.45XA

## 2012-10-24 HISTORY — DX: Other complications of anesthesia, initial encounter: T88.59XA

## 2012-10-24 HISTORY — DX: Overactive bladder: N32.81

## 2012-10-24 HISTORY — PX: CARPAL TUNNEL RELEASE: SHX101

## 2012-10-24 HISTORY — DX: Hyperlipidemia, unspecified: E78.5

## 2012-10-24 HISTORY — DX: Carpal tunnel syndrome, right upper limb: G56.01

## 2012-10-24 SURGERY — CARPAL TUNNEL RELEASE
Anesthesia: Regional | Site: Wrist | Laterality: Right | Wound class: Clean

## 2012-10-24 MED ORDER — FENTANYL CITRATE 0.05 MG/ML IJ SOLN
INTRAMUSCULAR | Status: DC | PRN
  Administered 2012-10-24 (×2): 50 ug via INTRAVENOUS

## 2012-10-24 MED ORDER — MIDAZOLAM HCL 5 MG/5ML IJ SOLN
INTRAMUSCULAR | Status: DC | PRN
  Administered 2012-10-24 (×2): 1 mg via INTRAVENOUS

## 2012-10-24 MED ORDER — BUPIVACAINE HCL (PF) 0.5 % IJ SOLN
INTRAMUSCULAR | Status: DC | PRN
  Administered 2012-10-24: 6.5 mL

## 2012-10-24 MED ORDER — LACTATED RINGERS IV SOLN
INTRAVENOUS | Status: DC
  Administered 2012-10-24: 12:00:00 via INTRAVENOUS

## 2012-10-24 MED ORDER — ONDANSETRON HCL 4 MG/2ML IJ SOLN
INTRAMUSCULAR | Status: DC | PRN
  Administered 2012-10-24: 4 mg via INTRAVENOUS

## 2012-10-24 MED ORDER — LIDOCAINE HCL (PF) 0.5 % IJ SOLN
INTRAMUSCULAR | Status: DC | PRN
  Administered 2012-10-24: 35 mL via INTRAVENOUS

## 2012-10-24 MED ORDER — OXYCODONE-ACETAMINOPHEN 5-325 MG PO TABS: 1.0000 | ORAL_TABLET | ORAL | Status: DC | PRN

## 2012-10-24 MED ORDER — CEFAZOLIN SODIUM-DEXTROSE 2-3 GM-% IV SOLR
2.0000 g | INTRAVENOUS | Status: AC
  Administered 2012-10-24: 2 g via INTRAVENOUS

## 2012-10-24 MED ORDER — MIDAZOLAM HCL 2 MG/2ML IJ SOLN: 1.0000 mg | INTRAMUSCULAR | Status: DC | PRN

## 2012-10-24 MED ORDER — FENTANYL CITRATE 0.05 MG/ML IJ SOLN: 50.0000 ug | INTRAMUSCULAR | Status: DC | PRN

## 2012-10-24 MED ORDER — FENTANYL CITRATE 0.05 MG/ML IJ SOLN: 25.0000 ug | INTRAMUSCULAR | Status: DC | PRN

## 2012-10-24 MED ORDER — CEFAZOLIN SODIUM 1-5 GM-% IV SOLN: 1.0000 g | INTRAVENOUS | Status: DC

## 2012-10-24 MED ORDER — OXYCODONE HCL 5 MG PO TABS
5.0000 mg | ORAL_TABLET | ORAL | Status: AC | PRN
  Administered 2012-10-24: 5 mg via ORAL

## 2012-10-24 MED ORDER — MIDAZOLAM HCL 2 MG/ML PO SYRP: 12.0000 mg | ORAL_SOLUTION | Freq: Once | ORAL | Status: DC | PRN

## 2012-10-24 SURGICAL SUPPLY — 42 items
BANDAGE ELASTIC 3 VELCRO ST LF (GAUZE/BANDAGES/DRESSINGS) ×2 IMPLANT
BLADE SURG 15 STRL LF DISP TIS (BLADE) ×1 IMPLANT
BLADE SURG 15 STRL SS (BLADE) ×1
BNDG COHESIVE 3X5 TAN STRL LF (GAUZE/BANDAGES/DRESSINGS) ×2 IMPLANT
BNDG ESMARK 4X9 LF (GAUZE/BANDAGES/DRESSINGS) IMPLANT
CLOTH BEACON ORANGE TIMEOUT ST (SAFETY) ×2 IMPLANT
CORDS BIPOLAR (ELECTRODE) IMPLANT
COVER MAYO STAND STRL (DRAPES) ×2 IMPLANT
COVER TABLE BACK 60X90 (DRAPES) ×2 IMPLANT
CUFF TOURNIQUET SINGLE 18IN (TOURNIQUET CUFF) ×2 IMPLANT
DRAPE EXTREMITY T 121X128X90 (DRAPE) ×2 IMPLANT
DRAPE SURG 17X23 STRL (DRAPES) ×2 IMPLANT
DRSG PAD ABDOMINAL 8X10 ST (GAUZE/BANDAGES/DRESSINGS) ×2 IMPLANT
DURAPREP 26ML APPLICATOR (WOUND CARE) ×2 IMPLANT
GAUZE XEROFORM 1X8 LF (GAUZE/BANDAGES/DRESSINGS) ×2 IMPLANT
GLOVE BIOGEL PI IND STRL 8 (GLOVE) ×1 IMPLANT
GLOVE BIOGEL PI INDICATOR 8 (GLOVE) ×1
GLOVE ECLIPSE 6.5 STRL STRAW (GLOVE) ×2 IMPLANT
GLOVE INDICATOR 7.0 STRL GRN (GLOVE) ×2 IMPLANT
GLOVE ORTHO TXT STRL SZ7.5 (GLOVE) ×4 IMPLANT
GOWN BRE IMP PREV XXLGXLNG (GOWN DISPOSABLE) ×2 IMPLANT
GOWN PREVENTION PLUS XLARGE (GOWN DISPOSABLE) ×4 IMPLANT
NEEDLE HYPO 25X1 1.5 SAFETY (NEEDLE) ×2 IMPLANT
NS IRRIG 1000ML POUR BTL (IV SOLUTION) IMPLANT
PACK BASIN DAY SURGERY FS (CUSTOM PROCEDURE TRAY) ×2 IMPLANT
PAD CAST 3X4 CTTN HI CHSV (CAST SUPPLIES) ×1 IMPLANT
PADDING CAST ABS 3INX4YD NS (CAST SUPPLIES)
PADDING CAST ABS 4INX4YD NS (CAST SUPPLIES)
PADDING CAST ABS COTTON 3X4 (CAST SUPPLIES) IMPLANT
PADDING CAST ABS COTTON 4X4 ST (CAST SUPPLIES) IMPLANT
PADDING CAST COTTON 3X4 STRL (CAST SUPPLIES) ×1
SPLINT FIBERGLASS 3X35 (CAST SUPPLIES) ×2 IMPLANT
SPLINT PLASTER CAST XFAST 3X15 (CAST SUPPLIES) IMPLANT
SPLINT PLASTER XTRA FASTSET 3X (CAST SUPPLIES)
SPONGE GAUZE 4X4 12PLY (GAUZE/BANDAGES/DRESSINGS) ×2 IMPLANT
STOCKINETTE 4X48 STRL (DRAPES) ×2 IMPLANT
SUT ETHILON 3 0 PS 1 (SUTURE) ×2 IMPLANT
SYR BULB 3OZ (MISCELLANEOUS) ×2 IMPLANT
SYR CONTROL 10ML LL (SYRINGE) ×2 IMPLANT
TOWEL OR 17X24 6PK STRL BLUE (TOWEL DISPOSABLE) ×2 IMPLANT
UNDERPAD 30X30 INCONTINENT (UNDERPADS AND DIAPERS) ×2 IMPLANT
WATER STERILE IRR 1000ML POUR (IV SOLUTION) ×2 IMPLANT

## 2012-10-24 NOTE — Anesthesia Preprocedure Evaluation (Addendum)
Anesthesia Evaluation  Patient identified by MRN, date of birth, ID band Patient awake    Reviewed: Allergy & Precautions  Airway Mallampati: II      Dental   Pulmonary shortness of breath, sleep apnea and Continuous Positive Airway Pressure Ventilation ,  Sleep apnea history noted.Dr. Sampson Goon discussed  with Dr. Eulah Pont who agreed to do surgery under bier block. CE breath sounds clear to auscultation        Cardiovascular hypertension, Rhythm:Regular Rate:Normal     Neuro/Psych  Headaches, Anxiety    GI/Hepatic negative GI ROS, Neg liver ROS,   Endo/Other  diabetes  Renal/GU negative Renal ROS     Musculoskeletal   Abdominal   Peds  Hematology   Anesthesia Other Findings   Reproductive/Obstetrics                        Anesthesia Physical Anesthesia Plan  ASA: III  Anesthesia Plan: Bier Block   Post-op Pain Management:    Induction:   Airway Management Planned: Nasal Cannula  Additional Equipment:   Intra-op Plan:   Post-operative Plan: Extubation in OR  Informed Consent: I have reviewed the patients History and Physical, chart, labs and discussed the procedure including the risks, benefits and alternatives for the proposed anesthesia with the patient or authorized representative who has indicated his/her understanding and acceptance.   Dental advisory given  Plan Discussed with: CRNA, Anesthesiologist and Surgeon  Anesthesia Plan Comments:       Anesthesia Quick Evaluation

## 2012-10-24 NOTE — Transfer of Care (Signed)
Immediate Anesthesia Transfer of Care Note  Patient: Kayla [name]  Procedure(s) Performed: Procedure(s) with comments: CARPAL TUNNEL RELEASE (Right) - RIGHT CARPAL TUNNEL RELEASE  Patient Location: PACU  Anesthesia Type:Bier block  Level of Consciousness: awake, alert  and oriented  Airway & Oxygen Therapy: Patient Spontanous Breathing  Post-op Assessment: Report given to PACU RN and Post -op Vital signs reviewed and stable  Post vital signs: Reviewed and stable  Complications: No apparent anesthesia complications

## 2012-10-24 NOTE — Brief Op Note (Signed)
10/24/2012  12:58 PM  PATIENT:  Eunice Blase Kontz  52 y.o. female  PRE-OPERATIVE DIAGNOSIS:  CARPAL TUNNEL SYNDROME RIGHT WRIST  POST-OPERATIVE DIAGNOSIS:  CARPAL TUNNEL SYNDROME RIGHT WRIST  PROCEDURE:  Procedure(s) with comments: CARPAL TUNNEL RELEASE (Right) - RIGHT CARPAL TUNNEL RELEASE  SURGEON:  Surgeon(s) and Role:    * Loreta Ave, MD - Primary  PHYSICIAN ASSISTANT: Zonia Kief M    ANESTHESIA:   regional and IV sedation  EBL:  Total I/O In: 500 [I.V.:500] Out: -   BLOOD ADMINISTERED:none SPECIMEN:  No Specimen  DISPOSITION OF SPECIMEN:  N/A  COUNTS:  YES  TOURNIQUET:   Total Tourniquet Time Documented: Forearm (Right) - 25 minutes Total: Forearm (Right) - 25 minutes   PATIENT DISPOSITION:  PACU - hemodynamically stable.

## 2012-10-24 NOTE — Anesthesia Postprocedure Evaluation (Signed)
  Anesthesia Post-op Note  Patient: Kayla [name]  Procedure(s) Performed: Procedure(s) with comments: CARPAL TUNNEL RELEASE (Right) - RIGHT CARPAL TUNNEL RELEASE  Patient Location: PACU  Anesthesia Type:MAC  Level of Consciousness: awake, alert  and oriented  Airway and Oxygen Therapy: Patient Spontanous Breathing  Post-op Pain: none  Post-op Assessment: Post-op Vital signs reviewed, Patient's Cardiovascular Status Stable, Respiratory Function Stable, Patent Airway and No signs of Nausea or vomiting  Post-op Vital Signs: Reviewed and stable  Complications: No apparent anesthesia complications

## 2012-10-24 NOTE — H&P (Signed)
Kayla [name]/[facility] [address]   [address] Kayla [name], [place] [address] [phone] A Division of [facility]  Loreta Ave, M.D.   Robert A. Thurston Hole, M.D.   Burnell Blanks, M.D.   Eulas Post, M.D.   Lunette Stands, M.D Buford Dresser, M.D.  Charlsie Quest, M.D.   Estell Harpin, M.D.   Melina Fiddler, M.D. Genene Churn. Barry Dienes, PA-C            Kirstin A. Shepperson, PA-C Josh [place], PA-C [place], [place]   RE: Kayla [name], Kayla [name]                                [phone]      DOB: 1960/09/29 PROGRESS NOTE: 07-15-12 [age] year-old white female who is about six weeks status post left total knee replacement.  Returns.  States that her knee is doing well.  Continues to improve with physical therapy.  Needs a refill of pain medication.  Last office visit we discussed bilateral carpal tunnel syndrome and she brings in NCV/EMG study report from March 12, 2006.  Test at that time showed bilateral carpal tunnel syndrome.  Right median nerve more involved and considered moderate to severe in degree.  Left side mild to moderate.  No evidence of ulnar neuropathy or polyneuropathy.  States that her right hand is worse than the left.  She does have bilateral grip weakness.  Progressively worsening numbness and tingling, right greater than left, thumb, index and long finger.  She would like to have surgery sometime around January if possible.  Patient states that she does occasionally have some neck pain.  She has never had cervical spine x-rays.    EXAMINATION: Pleasant white female, alert and oriented x 3 and in no acute distress.  Cervical spine good range of motion.  She has bilateral brachial plexus and trapezius tenderness.  Bilateral wrists good range of motion.  She has positive Tinel's and Phalen's.  Positive right thenar atrophy.  Decreased sensation to light touch right thumb, index and long finger.  Left knee range of motion  about 0-115+ degrees.  Some swelling.  Calf non-tender.  Neurovascularly intact.  Skin warm and dry.   X-RAYS: Cervical spine, AP and lateral views, show multilevel degenerative disc disease.  More narrowing at C5-6 and C6-7.  Left knee, AP, lateral and sunrise views, show good alignment and seating of her prosthesis.    DISPOSITION:  In regards to her left knee, she will continue physical therapy per protocol.  Follow up in six weeks for recheck.  For her hands recommend bilateral carpal tunnel releases and we will do the right one first.  Pre-op paperwork filled out.  She will call and let us know when she is ready to proceed with final scheduling.  All questions answered.  Refilled Oda Kilts, M.D.   Electronically verified by Loreta Ave, M.D. DFM(JMO):jjh  Demetri Kerman/[facility] [address]   [address] [place],  [address] [phone] A Division of [facility]  Loreta Ave, M.D.   Robert A. Thurston Hole, M.D.   Burnell Blanks, M.D.   Eulas Post, M.D.   Lunette Stands, M.D Buford Dresser, M.D.  Charlsie Quest, M.D.   Estell Harpin, M.D.   Melina Fiddler, M.D. Genene Churn. Barry Dienes, PA-C  Kirstin A. Shepperson, PA-C Josh [place], PA-C [place], [place]  RE: Kayla [name], Kayla [name]   [phone]      DOB: Jan 22, 1961 PROGRESS NOTE: 09-12-12 Kayla [name] returns for follow-up. Left total knee replacement by me 06/04/12. Still has a little pain and swelling globally around her knee in her soft tissues. Overall doing well and has advanced well with physical therapy. She remains out of work. We're anticipating sequential bilateral carpal tunnel release in the next 4-6 weeks for documented carpal tunnel syndrome. We discussed that in the past and reviewed it again with her today. Remaining history and general exam is outlined and included in the chart.   EXAMINATION: Her gait and stance is good. Left  knee has full extension 120 degrees of flexion. Good alignment and stability. Just soft tissue irritation over her collaterals and medial and lateral retinaculum. Good strength. She's neurovascularly intact. Both hands have carpal tunnel syndrome positive Tinel's and Phalen's. Moderate atrophy on the right. She also has [facility] degenerative joint disease base of the thumb on the right.  DISPOSITION: 1. Doing well after total knee replacement. Continue exercise/rehab program. Recheck of her knee in March when she's 3 months out and repeat x-rays at that time. 2. Remain out of work. We'll see her at the time of operative intervention for her right carpal tunnel release in 4 weeks. The left will be done subsequent to that. Anticipate out of work until she's recovered and rehabbed from those and her knee. She understands and agrees.  Loreta Ave, M.D.  Electronically verified by Loreta Ave, M.D. DFM:kh D 09-12-12

## 2012-10-26 ENCOUNTER — Other Ambulatory Visit: Payer: Self-pay | Admitting: Internal Medicine

## 2012-10-27 ENCOUNTER — Encounter (HOSPITAL_BASED_OUTPATIENT_CLINIC_OR_DEPARTMENT_OTHER): Payer: Self-pay | Admitting: Orthopedic Surgery

## 2012-10-27 NOTE — Op Note (Signed)
NAME:  Kayla [name], Kayla [name]        ACCOUNT NO.:  [id number]  MEDICAL RECORD NO.:  [id number]  LOCATION:                               FACILITY:  [facility]  PHYSICIAN:  Loreta Ave, M.D. DATE OF BIRTH:  11/18/1960  DATE OF PROCEDURE:  10/24/2012 DATE OF DISCHARGE:  10/24/2012                              OPERATIVE REPORT   PREOPERATIVE DIAGNOSIS:  Right carpal tunnel syndrome.  POSTOPERATIVE DIAGNOSIS:  Right carpal tunnel syndrome.  PROCEDURE:  Right carpal tunnel release.  SURGEON:  Loreta Ave, MD  ASSISTANT:  Genene Churn. Barry Dienes, [place]  ANESTHESIA:  IV regional.  SPECIMENS:  None.  CULTURES:  None.  COMPLICATION:  None.  DRESSING:  Soft compressive.  DESCRIPTION OF PROCEDURE:  The patient was brought to the operating room, placed on the operating table in supine position.  After adequate anesthesia had been obtained, prepped and draped in usual sterile fashion.  Small volar incision over the carpal tunnel.  Skin and subcutaneous tissue divided.  Retinaculum of the carpal tunnel incised for forearm fascia proximally to the palmar arch distally.  Nice complete decompression.  Digital branch, motor branch identified, protected, decompressed.  Moderate hourglass deformity on the nerve with hyperemia prior to release.  Once I confirmed a good release, wound was irrigated.  Closed with nylon.  Margins were injected with Marcaine. Sterile compressive dressing applied.  Short-arm splint, bulky hand dressing applied.  Anesthesia reversed.  Brought to recovery room. Tolerated surgery well.  No complications.     Loreta Ave, M.D.    DFM/MEDQ  D:  10/24/2012  T:  10/24/2012  Job:  [id number]

## 2012-12-17 ENCOUNTER — Ambulatory Visit (INDEPENDENT_AMBULATORY_CARE_PROVIDER_SITE_OTHER): Payer: BC Managed Care – PPO | Admitting: Internal Medicine

## 2012-12-17 ENCOUNTER — Encounter: Payer: Self-pay | Admitting: Internal Medicine

## 2012-12-17 VITALS — BP 120/80 | HR 89 | Temp 98.9°F | Resp 20 | Wt 215.0 lb

## 2012-12-17 DIAGNOSIS — I10 Essential (primary) hypertension: Secondary | ICD-10-CM

## 2012-12-17 DIAGNOSIS — J069 Acute upper respiratory infection, unspecified: Secondary | ICD-10-CM

## 2012-12-17 DIAGNOSIS — E785 Hyperlipidemia, unspecified: Secondary | ICD-10-CM

## 2012-12-17 DIAGNOSIS — E119 Type 2 diabetes mellitus without complications: Secondary | ICD-10-CM

## 2012-12-17 MED ORDER — HYDROCODONE-HOMATROPINE 5-1.5 MG/5ML PO SYRP: 5.0000 mL | ORAL_SOLUTION | Freq: Four times a day (QID) | ORAL | Status: AC | PRN

## 2012-12-17 NOTE — Progress Notes (Signed)
Subjective:    Patient ID: Kayla [name], female    DOB: 01/30/1961, 52 y.o.   MRN: [id number]  HPI  52 year old patient who is seen today for her quarterly diabetic followup. Her last hemoglobin A1c 6.6. For the past 2 weeks she has had cough and congestion. Cough is quite severe but nonproductive. There is been no fever. She's also had a four-week history of diarrhea. Of cough and diarrhea are improving. She has a history of hypertension. She is maintain ice glycemic control. She has osteoarthritis that has been fairly stable.  Past Medical History  Diagnosis Date  . Depressive disorder, not elsewhere classified   . Mitral valve prolapse   . Factor II deficiency     "prothrombin gene factor II" (06/05/2012); states has had no problems  . PTSD (post-traumatic stress disorder)   . Headache     due to allergies  . Overactive bladder   . Complication of anesthesia     hard to wake up after surg. 06/04/2012 and CO2 went up to 78  . Hyperlipidemia   . Carpal tunnel syndrome of right wrist 10/2012  . Exertional dyspnea     occasionally  . OSA on CPAP   . Type II diabetes mellitus     NIDDM  . Heart murmur     states no problems  . Osteoarthritis     knees    History   Social History  . Marital Status: Married    Spouse Name: N/A    Number of Children: 2  . Years of Education: N/A   Occupational History  . Chiropodist    Social History Main Topics  . Smoking status: Never Smoker   . Smokeless tobacco: Never Used  . Alcohol Use: No  . Drug Use: No  . Sexually Active: Yes   Other Topics Concern  . Not on file   Social History Narrative  . No narrative on file    Past Surgical History  Procedure Laterality Date  . Total abdominal hysterectomy  08/27/2001  . Lasik      bilateral  . Knee arthroscopy w/ meniscectomy Left 08/16/2011    partial medial  . Total knee arthroplasty  02/06/2012    Procedure: TOTAL KNEE ARTHROPLASTY;  Surgeon: Loreta Ave, MD;   Location: [facility] OR;  Service: Orthopedics;  Laterality: Right;  total knee right side  . Knee arthroscopy Right 04/01/2008  . Shoulder arthroscopy w/ rotator cuff repair Left 2004  . Vaginal hysterectomy  1980's  . Total knee arthroplasty  06/04/2012    Procedure: TOTAL KNEE ARTHROPLASTY;  Surgeon: Loreta Ave, MD;  Location: [facility] OR;  Service: Orthopedics;  Laterality: Left;  . Pubovaginal sling  08/27/2001    cysto; suprapubic tube placement  . Breast biopsy Left 04/25/2001  . Carpal tunnel release Right 10/24/2012    Procedure: CARPAL TUNNEL RELEASE;  Surgeon: Loreta Ave, MD;  Location: [facility];  Service: Orthopedics;  Laterality: Right;  RIGHT CARPAL TUNNEL RELEASE    Family History  Problem Relation Age of Onset  . Heart disease Father   . COPD Mother   . Heart disease Mother   . Heart attack Brother     Allergies  Allergen Reactions  . Morphine And Related Shortness Of Breath and Itching    Current Outpatient Prescriptions on File Prior to Visit  Medication Sig Dispense Refill  . atorvastatin (LIPITOR) 20 MG tablet Take 1 tablet (20 mg total) by mouth daily.  90 tablet  4  . LORazepam (ATIVAN) 0.5 MG tablet Take 1 tablet (0.5 mg total) by mouth every 8 (eight) hours as needed for anxiety.  60 tablet  1  . meloxicam (MOBIC) 15 MG tablet Take 15 mg by mouth daily.      . meloxicam (MOBIC) 15 MG tablet TAKE ONE TABLET BY MOUTH EVERY DAY  90 tablet  0  . metFORMIN (GLUCOPHAGE-XR) 500 MG 24 hr tablet Take 2 tablets (1,000 mg total) by mouth daily with breakfast.  180 tablet  5  . methocarbamol (ROBAXIN) 500 MG tablet Take 1-2 tablets (500-1,000 mg total) by mouth every 6 (six) hours as needed (spasms).  60 tablet  0  . oxybutynin (DITROPAN XL) 15 MG 24 hr tablet Take 1 tablet (15 mg total) by mouth daily.  90 tablet  4  . oxyCODONE-acetaminophen (PERCOCET) 5-325 MG per tablet Take 1-2 tablets by mouth every 4 (four) hours as needed for pain.  30 tablet  0  .  sertraline (ZOLOFT) 100 MG tablet Take 1 tablet (100 mg total) by mouth daily.  90 tablet  4   No current facility-administered medications on file prior to visit.    BP 120/80  Pulse 89  Temp(Src) 98.9 F (37.2 C) (Oral)  Resp 20  Wt 215 lb (97.523 kg)  BMI 38.09 kg/m2  SpO2 96%       Review of Systems  Constitutional: Negative.   HENT: Negative for hearing loss, congestion, sore throat, rhinorrhea, dental problem, sinus pressure and tinnitus.   Eyes: Negative for pain, discharge and visual disturbance.  Respiratory: Positive for cough. Negative for shortness of breath.   Cardiovascular: Negative for chest pain, palpitations and leg swelling.  Gastrointestinal: Positive for diarrhea. Negative for nausea, vomiting, abdominal pain, constipation, blood in stool and abdominal distention.  Genitourinary: Negative for dysuria, urgency, frequency, hematuria, flank pain, vaginal bleeding, vaginal discharge, difficulty urinating, vaginal pain and pelvic pain.  Musculoskeletal: Negative for joint swelling, arthralgias and gait problem.  Skin: Negative for rash.  Neurological: Negative for dizziness, syncope, speech difficulty, weakness, numbness and headaches.  Hematological: Negative for adenopathy.  Psychiatric/Behavioral: Negative for behavioral problems, dysphoric mood and agitation. The patient is not nervous/anxious.        Objective:   Physical Exam  Constitutional: She is oriented to person, place, and time. She appears well-developed and well-nourished.  HENT:  Head: Normocephalic.  Right Ear: External ear normal.  Left Ear: External ear normal.  Mouth/Throat: Oropharynx is clear and moist.  Eyes: Conjunctivae and EOM are normal. Pupils are equal, round, and reactive to light.  Neck: Normal range of motion. Neck supple. No thyromegaly present.  Cardiovascular: Normal rate, regular rhythm, normal heart sounds and intact distal pulses.   Pulmonary/Chest: Effort normal. No  respiratory distress. She has no wheezes.  Few scattered rhonchi. Frequent paroxysms of coughing  Abdominal: Soft. Bowel sounds are normal. She exhibits no mass. There is no tenderness.  Musculoskeletal: Normal range of motion.  Lymphadenopathy:    She has no cervical adenopathy.  Neurological: She is alert and oriented to person, place, and time.  Skin: Skin is warm and dry. No rash noted.  Psychiatric: She has a normal mood and affect. Her behavior is normal.          Assessment & Plan:   Viral URI with refractory cough. We'll treat symptomatically Diarrhea. We'll continue symptomatic treatment with Imodium. We'll also place on a probiotic Diabetes mellitus. Will check a hemoglobin A1c Dyslipidemia.  Continue atorvastatin

## 2012-12-17 NOTE — Patient Instructions (Signed)
Take over-the-counter expectorants and cough medications such as  Mucinex DM.  Call if there is no improvement in 5 to 7 days or if he developed worsening cough, fever, or new symptoms, such as shortness of breath or chest pain.   Please check your hemoglobin A1c every 3 months  ALIGN ONE DAILY

## 2012-12-29 ENCOUNTER — Ambulatory Visit: Payer: BC Managed Care – PPO | Admitting: Internal Medicine

## 2013-01-17 IMAGING — CR DG KNEE 1-2V PORT*R*
2 series · 2 of 2 positions shown · non-contrast
Comparison: None.

CLINICAL DATA: Postop right knee replacement

PORTABLE RIGHT KNEE - 1-2 VIEW

[view not recorded (1 of 2)]
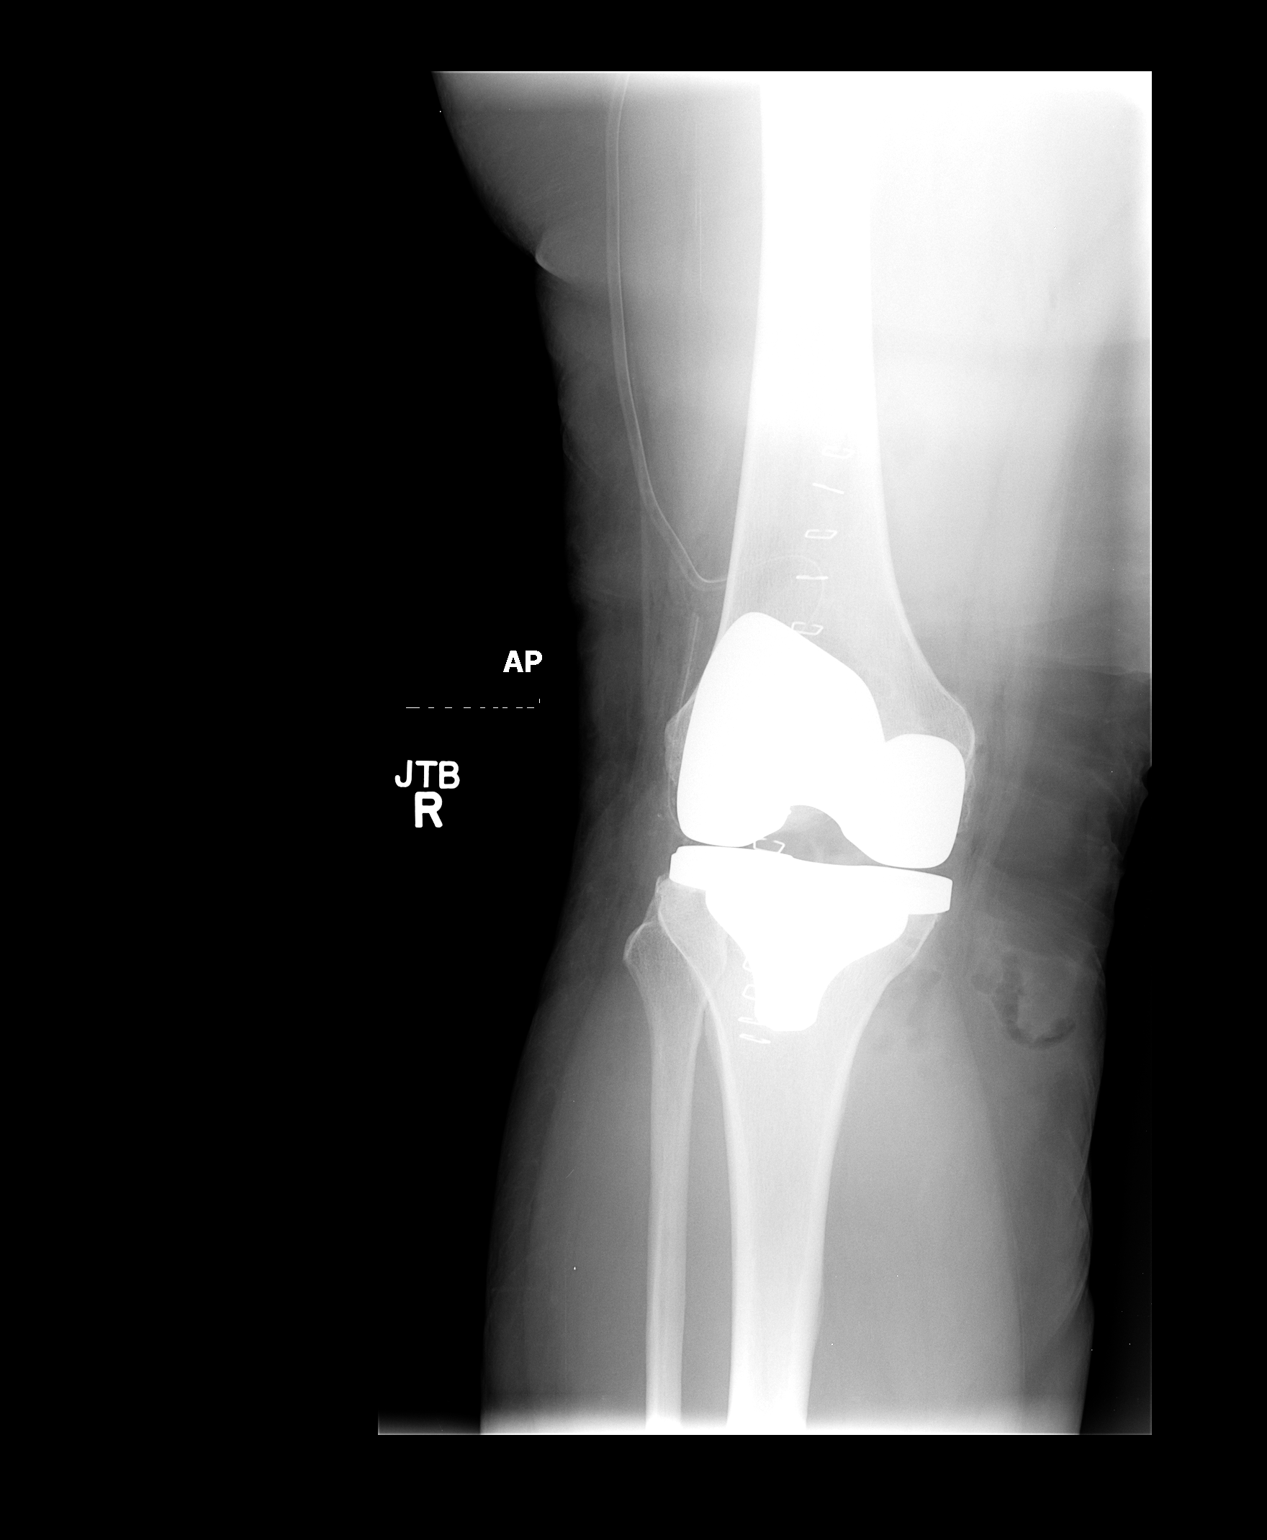

[view not recorded (2 of 2)]
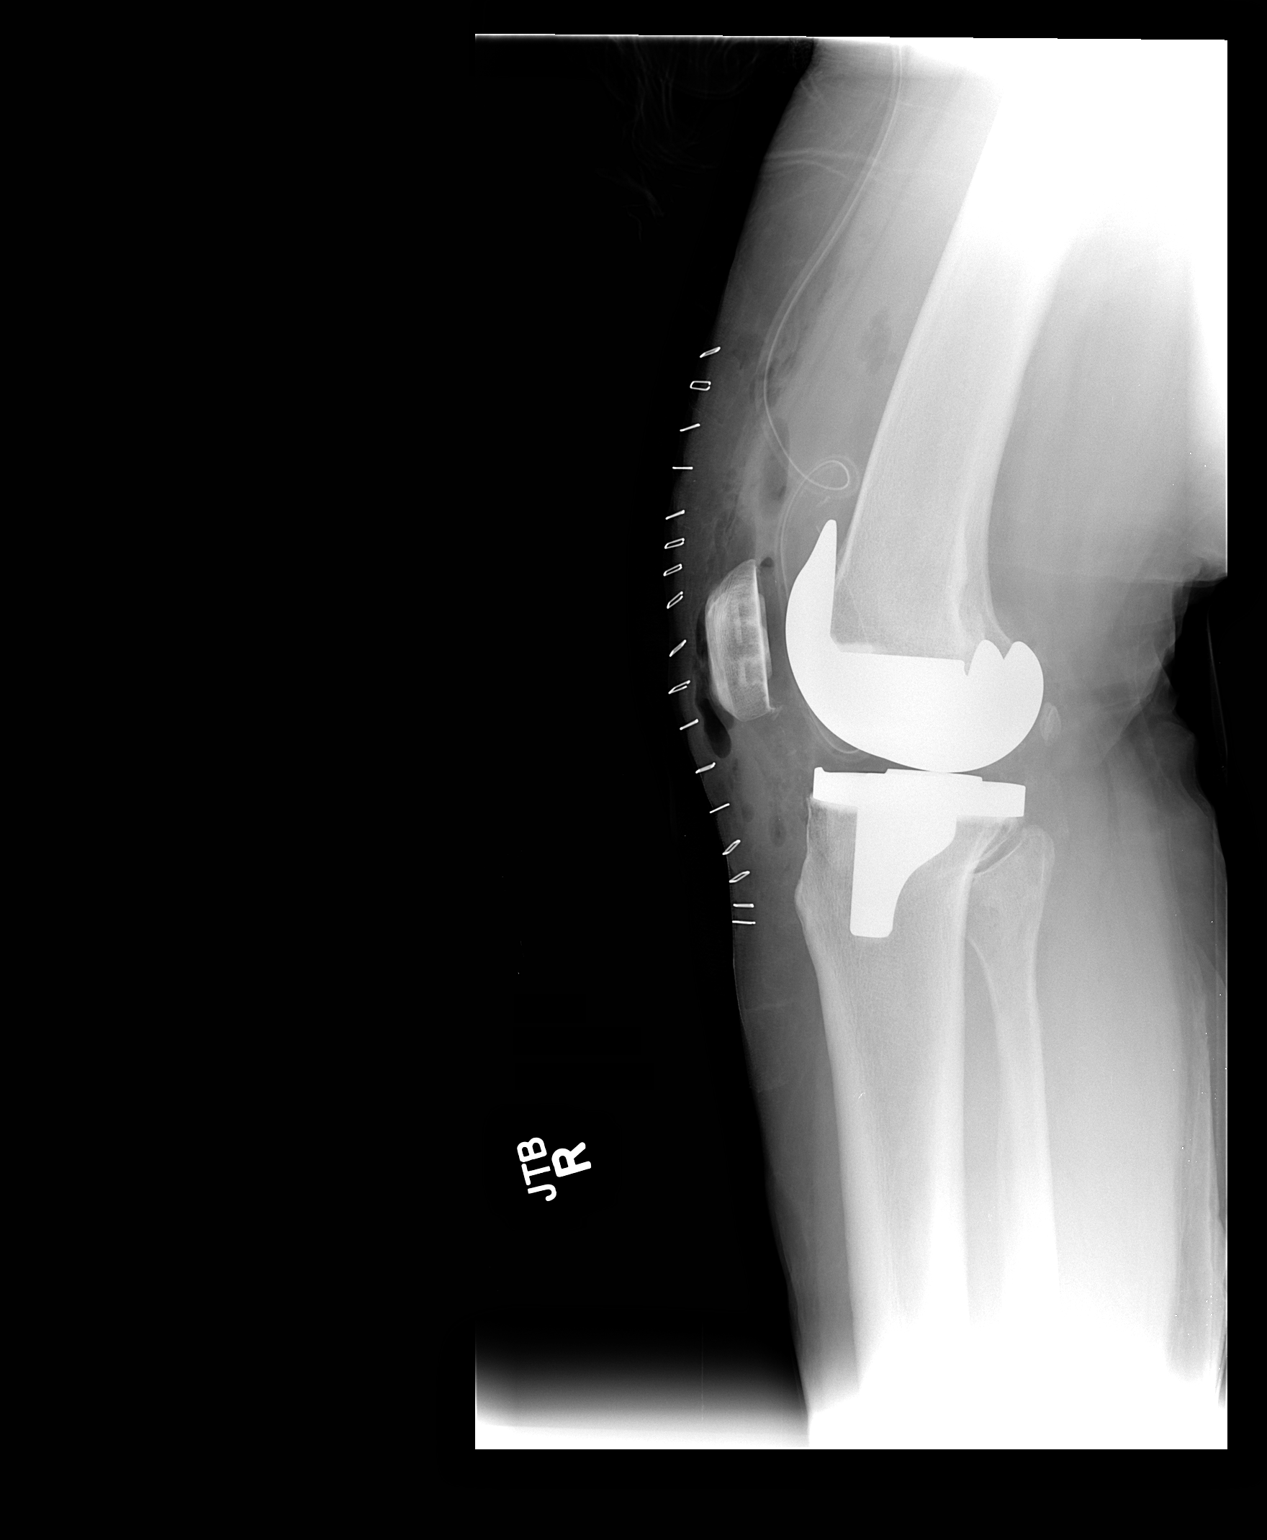

[2 of 2 positions shown; findings below may reference images not displayed]

FINDINGS: There is a total right knee prosthesis which is
anatomically aligned.  No evidence of fracture.  Postsurgical
drains and soft tissue gas are present.
IMPRESSION: New right total knee prosthesis is anatomically aligned.

## 2013-03-18 ENCOUNTER — Ambulatory Visit: Payer: BC Managed Care – PPO | Admitting: Internal Medicine

## 2013-03-20 ENCOUNTER — Other Ambulatory Visit: Payer: Self-pay | Admitting: *Deleted

## 2013-03-20 MED ORDER — MELOXICAM 15 MG PO TABS: 15.0000 mg | ORAL_TABLET | Freq: Every day | ORAL | Status: DC

## 2013-04-28 ENCOUNTER — Ambulatory Visit: Payer: BC Managed Care – PPO | Admitting: Internal Medicine

## 2013-05-07 ENCOUNTER — Ambulatory Visit (HOSPITAL_COMMUNITY): Payer: BC Managed Care – PPO | Admitting: Physical Therapy

## 2013-05-14 ENCOUNTER — Ambulatory Visit (HOSPITAL_COMMUNITY)
Admission: RE | Admit: 2013-05-14 | Discharge: 2013-05-14 | Disposition: A | Discharge status: Home / Self Care | Payer: BC Managed Care – PPO | Source: Ambulatory Visit | Attending: Physical Medicine and Rehabilitation | Admitting: Physical Medicine and Rehabilitation

## 2013-05-14 ENCOUNTER — Ambulatory Visit (HOSPITAL_COMMUNITY): Payer: BC Managed Care – PPO | Admitting: Physical Therapy

## 2013-05-14 DIAGNOSIS — I10 Essential (primary) hypertension: Secondary | ICD-10-CM | POA: Insufficient documentation

## 2013-05-14 DIAGNOSIS — M545 Low back pain, unspecified: Secondary | ICD-10-CM | POA: Insufficient documentation

## 2013-05-14 DIAGNOSIS — E119 Type 2 diabetes mellitus without complications: Secondary | ICD-10-CM | POA: Insufficient documentation

## 2013-05-14 DIAGNOSIS — R29898 Other symptoms and signs involving the musculoskeletal system: Secondary | ICD-10-CM | POA: Insufficient documentation

## 2013-05-14 DIAGNOSIS — R262 Difficulty in walking, not elsewhere classified: Secondary | ICD-10-CM | POA: Insufficient documentation

## 2013-05-14 DIAGNOSIS — M25569 Pain in unspecified knee: Secondary | ICD-10-CM | POA: Insufficient documentation

## 2013-05-14 DIAGNOSIS — IMO0001 Reserved for inherently not codable concepts without codable children: Secondary | ICD-10-CM | POA: Insufficient documentation

## 2013-05-14 NOTE — Evaluation (Addendum)
Physical Therapy Evaluation  Patient Details  Name: Kayla [name] MRN: [id number] Date of Birth: 05-Oct-1960  Today's Date: 05/14/2013 Time: 1100-1135 PT Time Calculation (min): 35 min Charge evaluation             Visit#: 1 of 8  Re-eval: 06/13/13 Assessment Diagnosis: Back Pain Surgical Date: 06/04/12 Next MD Visit:  (06/10/2013)  Authorization: BC[employer]     Past Medical History:  Past Medical History  Diagnosis Date  . Depressive disorder, not elsewhere classified   . Mitral valve prolapse   . Factor II deficiency     "prothrombin gene factor II" (06/05/2012); states has had no problems  . PTSD (post-traumatic stress disorder)   . Headache     due to allergies  . Overactive bladder   . Complication of anesthesia     hard to wake up after surg. 06/04/2012 and CO2 went up to 78  . Hyperlipidemia   . Carpal tunnel syndrome of right wrist 10/2012  . Exertional dyspnea     occasionally  . OSA on CPAP   . Type II diabetes mellitus     NIDDM  . Heart murmur     states no problems  . Osteoarthritis     knees   Past Surgical History:  Past Surgical History  Procedure Laterality Date  . Total abdominal hysterectomy  08/27/2001  . Lasik      bilateral  . Knee arthroscopy w/ meniscectomy Left 08/16/2011    partial medial  . Total knee arthroplasty  02/06/2012    Procedure: TOTAL KNEE ARTHROPLASTY;  Surgeon: Kayla Ave, MD;  Location: [facility] OR;  Service: Orthopedics;  Laterality: Right;  total knee right side  . Knee arthroscopy Right 04/01/2008  . Shoulder arthroscopy w/ rotator cuff repair Left 2004  . Vaginal hysterectomy  1980's  . Total knee arthroplasty  06/04/2012    Procedure: TOTAL KNEE ARTHROPLASTY;  Surgeon: Kayla Ave, MD;  Location: [facility] OR;  Service: Orthopedics;  Laterality: Left;  . Pubovaginal sling  08/27/2001    cysto; suprapubic tube placement  . Breast biopsy Left 04/25/2001  . Carpal tunnel release Right 10/24/2012    Procedure: CARPAL TUNNEL  RELEASE;  Surgeon: Kayla Ave, MD;  Location: [facility];  Service: Orthopedics;  Laterality: Right;  RIGHT CARPAL TUNNEL RELEASE    Subjective Symptoms/Limitations Symptoms: Kayla [name] states that she had a LT TKR on the Sept 25 2013.  She states that she has never regained the strength in her Lt knee.  She has pain on the medial and lateral aspect of her knee.  She states she is having difficulty coming sit to stand, and stairclimbing, she is unable to stoop or bend due to not being able to pull herself up; getting in and out of a car and she is having difficulty walking and sitting.  She also states that she has had  LBP for months a MRI was taken that showed a bulge disc at L4-L5.  She has recieved 3 injections to relieve the radicular pain down her leg, the last being 6 weeks ago however she is still experiencing radicular sx.  The patient began having Rt heel pain in April of this year which is acturally getting better.  She has had no treatment for the heel. Pertinent History: Rt TKR 01/23/2011 How long can you sit comfortably?: 20 minutes How long can you stand comfortably?: 15 minutes How long can you walk comfortably?: 15 minutes  Special Tests:  she is waking up 3-4 times a night due to increased pain but this is equally her back and her knee.    Pain Assessment Currently in Pain?: Yes Pain Score: 3  Pain Location: Knee Pain Orientation: Left Pain Type: Chronic pain Pain Onset: More than a month ago Pain Frequency: Constant Pain Relieving Factors: none Multiple Pain Sites: Yes   Balance Screening Balance Screen Has the patient fallen in the past 6 months: No  Prior Functioning  Prior Function Vocation: Unemployed Leisure: Hobbies-yes (Comment) Comments: gardening playing with grandchild, housework (Pt has not been able to due to her knee pain.)   Sensation/Coordination/Flexibility/Functional Tests Functional Tests Functional Tests: LEFS  15/80  Assessment RLE AROM (degrees) Right Ankle Dorsiflexion:  (wnl) Right Ankle Plantar Flexion:  (wnl) Right Ankle Inversion:  (wnl) Right Ankle Eversion:  (wnl) RLE Strength Right Hip Flexion: 4/5 Right Hip Extension: 3/5 Right Hip ABduction: 4/5 Right Hip ADduction: 3+/5 Right Knee Flexion: 3+/5 Right Knee Extension: 3+/5 Right Ankle Dorsiflexion: 3/5 LLE AROM (degrees) Left Knee Extension: 0 Left Knee Flexion: 120 LLE Strength Left Hip Flexion: 3/5 Left Hip Extension: 3/5 Left Hip ABduction: 3/5 Left Hip ADduction: 3/5 Left Knee Flexion: 3/5 Left Knee Extension: 3+/5 Left Ankle Dorsiflexion: 3/5 Lumbar AROM Lumbar Extension: decreased 20% with no change Lumbar - Right Side Bend: wnl with increased pain Lumbar - Left Side Bend: decreased 20% with reps causing increased pain. Lumbar - Right Rotation: wnl Lumbar - Left Rotation: wnl  Exercise/Treatments   Supine Ab Set: 10 reps Bent Knee Raise: 10 reps Bridge: 10 reps  Physical Therapy Assessment and Plan PT Assessment and Plan Clinical Impression Statement: Pt to department with complaint of radicular back pain.  Pt demonstrates decreased core and LE strenght as well as decreased knowledge of proper body mechanics. Pt will benefit from skilled Pt to educate and strengthen core mm to decrease back pain Pt will benefit from skilled therapeutic intervention in order to improve on the following deficits: Decreased strength;Difficulty walking;Pain;Improper body mechanics Rehab Potential: Good PT Frequency: Min 2X/week PT Duration: 4 weeks PT Treatment/Interventions: Functional mobility training;Therapeutic activities;Therapeutic exercise;Patient/family education PT Plan: continue with education on body mechanics,  begin lumbar stretches as well as core strengthening.    Goals Home Exercise Program Pt/caregiver will Perform Home Exercise Program: For increased strengthening;For increased ROM PT Short Term  Goals Time to Complete Short Term Goals: 2 weeks PT Short Term Goal 1: Pt to be able to tolerate standing for 30 minutes to be able to make a small meal PT Short Term Goal 2: Pt mm strength to be improved by one grade to allow the above to occur PT Short Term Goal 3: Pt back pain to be no greater than a 4/10 80% of the day PT Short Term Goal 4: Pt to be able to demonstrate and verbalize proper body mechanics for lifting and postion changing PT Long Term Goals Time to Complete Long Term Goals: 4 weeks PT Long Term Goal 1: Pt to be I in advance HEP PT Long Term Goal 2: Pt strength to be iimproved one grade to allow pt to stand for 40 minutes to make a normal meal. Long Term Goal 3: PT to be able to sit with comfort for an hour to travel or watch a show Long Term Goal 4: Pt pain to be no greater than a 2/10 80% of the day PT Long Term Goal 5: no radicular pain  Problem List Patient Active Problem List  Diagnosis Date Noted  . Leg weakness, bilateral 05/14/2013  . Diabetes mellitus 08/03/2011  . Obstructive sleep apnea 01/04/2011  . SORE THROAT 01/31/2010  . INSOMNIA 11/10/2009  . OSTEOARTHRITIS 08/10/2008  . ACUTE CYSTITIS 03/16/2008  . HYPERLIPIDEMIA 10/31/2007  . DEPRESSION 10/31/2007  . HYPERTENSION 10/31/2007  . HYPERTENSION, BENIGN ESSENTIAL 04/21/2007  . SHOULDER PAIN, BILATERAL 04/21/2007    PT Plan of Care PT Home Exercise Plan: given  GP    Marvie Brevik,CINDY 05/14/2013, 4:58 PM  Physician Documentation Your signature is required to indicate approval of the treatment plan as stated above.  Please sign and either send electronically or make a copy of this report for your files and return this physician signed original.   Please mark one 1.__approve of plan  2. ___approve of plan with the following conditions.   ______________________________                                                          _____________________ Physician Signature                                                                                                              Date

## 2013-05-14 NOTE — Evaluation (Signed)
Physical Therapy Evaluation  Patient Details  Name: Kayla [name] MRN: [id number] Date of Birth: 08/23/1961  Today's Date: 05/14/2013 Time: 1135-1210 PT Time Calculation (min): 35 min Charge:  evaluation             Visit#: 1 of 8  Re-eval: 06/13/13 Assessment Diagnosis: Lt TKR Surgical Date: 06/04/12 Next MD Visit:  (06/10/2013)  Authorization: BCBS     Past Medical History:  Past Medical History  Diagnosis Date  . Depressive disorder, not elsewhere classified   . Mitral valve prolapse   . Factor II deficiency     "prothrombin gene factor II" (06/05/2012); states has had no problems  . PTSD (post-traumatic stress disorder)   . Headache     due to allergies  . Overactive bladder   . Complication of anesthesia     hard to wake up after surg. 06/04/2012 and CO2 went up to 78  . Hyperlipidemia   . Carpal tunnel syndrome of right wrist 10/2012  . Exertional dyspnea     occasionally  . OSA on CPAP   . Type II diabetes mellitus     NIDDM  . Heart murmur     states no problems  . Osteoarthritis     knees   Past Surgical History:  Past Surgical History  Procedure Laterality Date  . Total abdominal hysterectomy  08/27/2001  . Lasik      bilateral  . Knee arthroscopy w/ meniscectomy Left 08/16/2011    partial medial  . Total knee arthroplasty  02/06/2012    Procedure: TOTAL KNEE ARTHROPLASTY;  Surgeon: Loreta Ave, MD;  Location: [facility] OR;  Service: Orthopedics;  Laterality: Right;  total knee right side  . Knee arthroscopy Right 04/01/2008  . Shoulder arthroscopy w/ rotator cuff repair Left 2004  . Vaginal hysterectomy  1980's  . Total knee arthroplasty  06/04/2012    Procedure: TOTAL KNEE ARTHROPLASTY;  Surgeon: Loreta Ave, MD;  Location: [facility] OR;  Service: Orthopedics;  Laterality: Left;  . Pubovaginal sling  08/27/2001    cysto; suprapubic tube placement  . Breast biopsy Left 04/25/2001  . Carpal tunnel release Right 10/24/2012    Procedure: CARPAL TUNNEL  RELEASE;  Surgeon: Loreta Ave, MD;  Location: [facility];  Service: Orthopedics;  Laterality: Right;  RIGHT CARPAL TUNNEL RELEASE    Subjective Symptoms/Limitations Symptoms: [name] states that she had a LT TKR on the Sept 25 2013.  She states that she has never regained the strength in her Lt knee.  She has pain on the medial and lateral aspect of her knee.  She states she is having difficulty coming sit to stand, and stairclimbing, she is unable to stoop or bend due to not being able to pull herself up; getting in and out of a car and she is having difficulty walking and sitting.  She also states that she has had  LBP for months a MRI was taken that showed a bulge disc at L4-L5.  She has recieved 3 injections to relieve the radicular pain down her leg, the last being 6 weeks ago however she is still experiencing radicular sx.  The patient began having Rt heel pain in April of this year which is acturally getting better.  She has had no treatment for the heel. Pertinent History: Rt TKR 01/23/2011 How long can you sit comfortably?: 20 minutes How long can you stand comfortably?: 15 minutes How long can you walk comfortably?: 15 minutes  Special  Tests: she is waking up 3-4 times a night due to increased pain but this is equally her back and her knee.    Pain Assessment Currently in Pain?: Yes Pain Score: 3  Pain Location: Knee Pain Orientation: Left Pain Type: Chronic pain Pain Frequency: Constant Multiple Pain Sites: Yes   Prior Functioning  Prior Function Vocation: Unemployed Leisure: Hobbies-yes (Comment) Comments: gardening playing with grandchild, housework (Pt has not been able to due to her knee pain.)   Sensation/Coordination/Flexibility/Functional Tests Functional Tests Functional Tests: LEFS 15/80  Assessment RLE AROM (degrees) Right Ankle Dorsiflexion:  (wnl) Right Ankle Plantar Flexion:  (wnl) Right Ankle Inversion:  (wnl) Right Ankle  Eversion:  (wnl) RLE Strength Right Hip Flexion: 4/5 Right Hip Extension: 3/5 Right Hip ABduction: 4/5 Right Hip ADduction: 3+/5 Right Knee Flexion: 3+/5 Right Knee Extension: 3+/5 Right Ankle Dorsiflexion: 3/5 LLE AROM (degrees) Left Knee Extension: 0 Left Knee Flexion: 120 LLE Strength Left Hip Flexion: 3/5 Left Hip Extension: 3/5 Left Hip ABduction: 3/5 Left Hip ADduction: 3/5 Left Knee Flexion: 3/5 Left Knee Extension: 3+/5 Left Ankle Dorsiflexion: 3/5 Lumbar AROM Lumbar Extension: decreased 20% with no change Lumbar - Right Side Bend: wnl with increased pain Lumbar - Left Side Bend: decreased 20% with reps causing increased pain. Lumbar - Right Rotation: wnl Lumbar - Left Rotation: wnl   Physical Therapy Assessment and Plan PT Assessment and Plan Clinical Impression Statement: Pt to department with complaint of chronic knee pain since pt underwent a TKR in September of 2013.  Pt has good ROM but demonstrates significant decreased strength and would benefit from skilled PT to return pt to prior functional level. Pt will benefit from skilled therapeutic intervention in order to improve on the following deficits: Difficulty walking;Pain;Decreased strength Rehab Potential: Good PT Frequency: Min 2X/week PT Duration: 4 weeks PT Treatment/Interventions: Stair training;Therapeutic activities;Therapeutic exercise;Balance training PT Plan: begin nustep, rockerboard, terminal extension, step ups...progress as indicated to return to normal strength.    Goals Home Exercise Program Pt/caregiver will Perform Home Exercise Program: For increased strengthening PT Short Term Goals Time to Complete Short Term Goals: 2 weeks PT Short Term Goal 1: Pt to be waking 2 times a night PT Short Term Goal 2: Pt mm strength to be improved by one grade to allow Pt to come from sit to stand from a high chair with greater ease PT Short Term Goal 3: Pt knee pain to be no greater than a 3/10 80% of  the day. PT Short Term Goal 4: Pt to be able to sit for 30 mintues with comfort to be able to enjoy a meal PT Long Term Goals Time to Complete Long Term Goals: 4 weeks PT Long Term Goal 1: Pt to be I in advance HEP PT Long Term Goal 2: Pt strength to be iimproved one grade to allow pt to be able to get in and out of a car with ease Long Term Goal 3: PT to be able to sit with comfort for an hour to travel or watch a show Long Term Goal 4: Pt to be able to go up and down steps wtihout diffiiculty PT Long Term Goal 5: Pt to be able to sleep throughout the night Additional PT Long Term Goals?: Yes PT Long Term Goal 6: Pt to be able to walk for 40 mintues for better health habits  Problem List Patient Active Problem List   Diagnosis Date Noted  . Leg weakness, bilateral 05/14/2013  . Difficulty walking 05/14/2013  .  Diabetes mellitus 08/03/2011  . Obstructive sleep apnea 01/04/2011  . SORE THROAT 01/31/2010  . INSOMNIA 11/10/2009  . OSTEOARTHRITIS 08/10/2008  . ACUTE CYSTITIS 03/16/2008  . HYPERLIPIDEMIA 10/31/2007  . DEPRESSION 10/31/2007  . HYPERTENSION 10/31/2007  . HYPERTENSION, BENIGN ESSENTIAL 04/21/2007  . SHOULDER PAIN, BILATERAL 04/21/2007       GP    RUSSELL,CINDY 05/14/2013, 5:10 PM  Physician Documentation Your signature is required to indicate approval of the treatment plan as stated above.  Please sign and either send electronically or make a copy of this report for your files and return this physician signed original.   Please mark one 1.__approve of plan  2. ___approve of plan with the following conditions.   ______________________________                                                          _____________________ Physician Signature                                                                                                             Date

## 2013-05-19 ENCOUNTER — Ambulatory Visit (HOSPITAL_COMMUNITY): Payer: BC Managed Care – PPO | Admitting: *Deleted

## 2013-05-22 ENCOUNTER — Ambulatory Visit (HOSPITAL_COMMUNITY)
Admission: RE | Admit: 2013-05-22 | Discharge: 2013-05-22 | Disposition: A | Discharge status: Home / Self Care | Payer: BC Managed Care – PPO | Source: Ambulatory Visit | Attending: Internal Medicine | Admitting: Internal Medicine

## 2013-05-22 DIAGNOSIS — R29898 Other symptoms and signs involving the musculoskeletal system: Secondary | ICD-10-CM

## 2013-05-22 NOTE — Progress Notes (Signed)
Physical Therapy Treatment Patient Details  Name: Kayla [name] MRN: [id number] Date of Birth: 11-Jun-1961  Today's Date: 05/22/2013 Time: 7829-5621 PT Time Calculation (min): 42 min Charge there ex [id number] Visit#: 2 of 8  Re-eval: 06/13/13    Authorization: BCBS   Subjective: Symptoms/Limitations Symptoms: Pt states that she is more concerned with the back than her knee  Pain Assessment Pain Score: 3  Pain Location: Back    Exercise/Treatments     Stretches Quad Stretch: 3 reps;30 seconds Aerobic Stationary Bike: Nustep L 3 x 6' Standing Heel Raises: 10 reps Functional Squats: 10 reps   Supine Bent Knee Raise: 10 reps Bridge: 15 reps Straight Leg Raise: 10 reps;Limitations Straight Leg Raises Limitations: floating   Prone  Straight Leg Raise: 10 reps Other Prone Lumbar Exercises: heel squeeze z 10      Physical Therapy Assessment and Plan PT Assessment and Plan Clinical Impression Statement: Pt with knee weakness and back pain.  Today treatment concentrated on back pain as pt was did not have two appointments to work on both knee and back.  Pt demonstrated good ability to stabilize once pt was verbally cued. Rehab Potential: Good PT Plan: begin wall slide; oppostie arm/leg raise next session        Problem List Patient Active Problem List   Diagnosis Date Noted  . Leg weakness, bilateral 05/14/2013  . Difficulty walking 05/14/2013  . Diabetes mellitus 08/03/2011  . Obstructive sleep apnea 01/04/2011  . SORE THROAT 01/31/2010  . INSOMNIA 11/10/2009  . OSTEOARTHRITIS 08/10/2008  . ACUTE CYSTITIS 03/16/2008  . HYPERLIPIDEMIA 10/31/2007  . DEPRESSION 10/31/2007  . HYPERTENSION 10/31/2007  . HYPERTENSION, BENIGN ESSENTIAL 04/21/2007  . SHOULDER PAIN, BILATERAL 04/21/2007       GP    Delainy Mcelhiney,CINDY 05/22/2013, 2:33 PM

## 2013-05-26 ENCOUNTER — Ambulatory Visit (HOSPITAL_COMMUNITY): Payer: BC Managed Care – PPO | Admitting: *Deleted

## 2013-05-28 ENCOUNTER — Ambulatory Visit (HOSPITAL_COMMUNITY): Payer: BC Managed Care – PPO | Admitting: *Deleted

## 2013-05-28 ENCOUNTER — Telehealth (HOSPITAL_COMMUNITY): Payer: Self-pay

## 2013-06-02 ENCOUNTER — Ambulatory Visit (HOSPITAL_COMMUNITY)
Admission: RE | Admit: 2013-06-02 | Discharge: 2013-06-02 | Disposition: A | Discharge status: Home / Self Care | Payer: BC Managed Care – PPO | Source: Ambulatory Visit | Attending: Internal Medicine | Admitting: Internal Medicine

## 2013-06-02 DIAGNOSIS — R262 Difficulty in walking, not elsewhere classified: Secondary | ICD-10-CM

## 2013-06-02 NOTE — Progress Notes (Signed)
Physical Therapy Treatment Patient Details  Name: Kayla [name] MRN: [id number] Date of Birth: Feb 25, 1961  Today's Date: 06/02/2013 Time: 1030-1109 PT Time Calculation (min): 39 min  Visit#: 3 of 8  Re-eval: 06/13/13 Diagnosis: Lt TKR Surgical Date: 06/04/12 Authorization: BCBS    Subjective: Symptoms/Limitations Symptoms: Pt states her knee is hurting today 5/10 but it always does.  sTates she has constant swelling in her Lt knee. Pain Assessment Currently in Pain?: Yes Pain Score: 5  Pain Location: Back Pain Orientation: Left   Exercise/Treatments Aerobic Stationary Bike: NuStep 8' Level 3 hills #3 Standing Heel Raises: 10 reps;Limitations Heel Raises Limitations: toeraises 10 reps Lateral Step Up: 10 reps;Both;Hand Hold: 1;Step Height: 4" Forward Step Up: 10 reps;Both;Hand Hold: 1;Step Height: 4" Step Down: 10 reps;Both;Step Height: 4";Hand Hold: 1 Wall Squat: 10 reps;5 seconds Rocker Board: 2 minutes;Limitations Rocker Board Limitations: R/L with 1 HHA Supine Straight Leg Raises: 10 reps;Left Sidelying Hip ABduction: 10 reps Hip ADduction: 10 reps Prone  Hamstring Curl: 10 reps      Physical Therapy Assessment and Plan PT Assessment and Plan Clinical Impression Statement: Added new knee exercises per flowsheet.  Pt was able to complete without pain or difficulty.  Noted weakness with eccentric quad contractions with step downs.   Noted swelling medial and lateral Lt knee that pt reports never goes away.  may benefit from retro or estim for edema reduction. Rehab Potential: Good PT Plan: Begin vector stance/SLS, TKE, quad stretch and SAQ with weights; progress weights with mat exercises then to cybex for strengthening.  Add manual techniques as needed for knee swelling and pain (retro vs estim/ice).     Problem List Patient Active Problem List   Diagnosis Date Noted  . Leg weakness, bilateral 05/14/2013  . Difficulty walking 05/14/2013  . Diabetes  mellitus 08/03/2011  . Obstructive sleep apnea 01/04/2011  . SORE THROAT 01/31/2010  . INSOMNIA 11/10/2009  . OSTEOARTHRITIS 08/10/2008  . ACUTE CYSTITIS 03/16/2008  . HYPERLIPIDEMIA 10/31/2007  . DEPRESSION 10/31/2007  . HYPERTENSION 10/31/2007  . HYPERTENSION, BENIGN ESSENTIAL 04/21/2007  . SHOULDER PAIN, BILATERAL 04/21/2007      Lurena Nida, PTA/CLT 06/02/2013, 11:45 AM

## 2013-06-02 NOTE — Progress Notes (Addendum)
Physical Therapy Treatment Patient Details  Name: Kayla [name] MRN: [id number] Date of Birth: October 02, 1960  Today's Date: 06/02/2013 Time: 4098-1191 PT Time Calculation (min): 33 min  Visit#: 2 of 8  Re-eval: 06/13/13 Diagnosis: Lumbar Pain Authorization: BCBS  Charges:  therex 24', manual 8'  Subjective: Symptoms/Limitations Symptoms: Pt states her back pain is 5/10, mainly on the Left side. Pain Assessment Currently in Pain?: Yes Pain Score: 5  Pain Location: Back Pain Orientation: Left   Exercise/Treatments Stretches Active Hamstring Stretch: 2 reps;30 seconds Single Knee to Chest Stretch: 2 reps;30 seconds Aerobic Tread Mill: 5' at 1.2 mph with 5% incline after MET Supine Bent Knee Raise: 10 reps Bridge: 10 reps Straight Leg Raise: 10 reps;Limitations Straight Leg Raises Limitations: floating Prone  Single Arm Raise: 10 reps Straight Leg Raise: 10 reps Other Prone Lumbar Exercises: heel squeeze  10 reps   Manual Therapy Manual Therapy: Other (comment) Other Manual Therapy: MET for Lt outflare  Physical Therapy Assessment and Plan PT Assessment and Plan Clinical Impression Statement: Noted malalignment in lumbar/thoracic when prone lying.  Pt found to have Lt pelvic outflare.  Able to correct and decrease pain with MET.  Completed treadmill and therex following MEt.  Added hamstring stretch and KTC stretch.   Rehab Potential: Good PT Plan: Progress stability therex to increase core strength.  CHECK SI  alignment first before therex.  Add prone oppositie arm and leg next visit.     Problem List Patient Active Problem List   Diagnosis Date Noted  . Leg weakness, bilateral 05/14/2013  . Difficulty walking 05/14/2013  . Diabetes mellitus 08/03/2011  . Obstructive sleep apnea 01/04/2011  . SORE THROAT 01/31/2010  . INSOMNIA 11/10/2009  . OSTEOARTHRITIS 08/10/2008  . ACUTE CYSTITIS 03/16/2008  . HYPERLIPIDEMIA 10/31/2007  . DEPRESSION 10/31/2007  .  HYPERTENSION 10/31/2007  . HYPERTENSION, BENIGN ESSENTIAL 04/21/2007  . SHOULDER PAIN, BILATERAL 04/21/2007      Lurena Nida, PTA/CLT 06/02/2013, 12:13 PM

## 2013-06-04 ENCOUNTER — Ambulatory Visit (HOSPITAL_COMMUNITY)
Admission: RE | Admit: 2013-06-04 | Discharge: 2013-06-04 | Disposition: A | Discharge status: Home / Self Care | Payer: BC Managed Care – PPO | Source: Ambulatory Visit | Attending: Internal Medicine | Admitting: Internal Medicine

## 2013-06-04 DIAGNOSIS — R29898 Other symptoms and signs involving the musculoskeletal system: Secondary | ICD-10-CM

## 2013-06-04 DIAGNOSIS — R262 Difficulty in walking, not elsewhere classified: Secondary | ICD-10-CM

## 2013-06-04 NOTE — Progress Notes (Signed)
Physical Therapy Treatment Patient Details  Name: Kayla [name] MRN: [id number] Date of Birth: 12-Sep-1960  Today's Date: 06/04/2013 Time: 1022-1110 PT Time Calculation (min): 48 min Charge: there ex 1022-1110 Visit#: 3 of 8  Re-eval: 06/13/13    Authorization: BCBS   Subjective: Symptoms/Limitations Symptoms: Pt states her knee is bothering her the most today Special Tests: she is waking up 3-4 times a night due to increased pain but this is equally her back and her knee.    Pain Assessment Currently in Pain?: Yes Pain Score: 6  Pain Location: Knee Pain Orientation: Anterior Pain Type: Chronic pain   Exercise/Treatments   Stretches Passive Hamstring Stretch: 1 rep;60 seconds Gastroc Stretch:  (slant board 3 x 30")   Standing Heel Raises: 10 reps Forward Lunges: Both;5 reps Lateral Step Up: 10 reps Forward Step Up: 10 reps Functional Squat: 10 reps Wall Squat: 10 reps Rocker Board: 2 minutes Seated Other Seated Knee Exercises: sit to stand x 5    Physical Therapy Assessment and Plan PT Assessment and Plan Clinical Impression Statement: Pt completed standing exercises to strengthen both her core and her knee mm.  Pt instructed in new exercises needing verbal and manual cuing for proper technique. PT Plan: Progress stability therex to increase core strength.   Add prone oppositie arm and leg next visit.    Goals    Problem List Patient Active Problem List   Diagnosis Date Noted  . Leg weakness, bilateral 05/14/2013  . Difficulty walking 05/14/2013  . Diabetes mellitus 08/03/2011  . Obstructive sleep apnea 01/04/2011  . SORE THROAT 01/31/2010  . INSOMNIA 11/10/2009  . OSTEOARTHRITIS 08/10/2008  . ACUTE CYSTITIS 03/16/2008  . HYPERLIPIDEMIA 10/31/2007  . DEPRESSION 10/31/2007  . HYPERTENSION 10/31/2007  . HYPERTENSION, BENIGN ESSENTIAL 04/21/2007  . SHOULDER PAIN, BILATERAL 04/21/2007       GP    RUSSELL,CINDY 06/04/2013, 11:05 AM

## 2013-06-04 NOTE — Progress Notes (Signed)
Physical Therapy Treatment Patient Details  Name: Kayla [name] MRN: [id number] Date of Birth: [birth year]  Today's Date: 06/04/2013 Time: 1110-1140 PT Time Calculation (min): 30 min Charge manual [phone]; there ex 1115-145 Visit#: 4 of 8  Re-eval: 06/13/13    Authorization: BCBS  Authorization Time Period:    Authorization Visit#:   of     Subjective: Symptoms/Limitations Symptoms: Pt states her back is feeling better.   Special Tests: she is waking up 3-4 times a night due to increased pain but this is equally her back and her knee.    Pain Assessment Currently in Pain?: Yes Pain Score: 6  Pain Location: Knee Pain Orientation: Anterior Pain Type: Chronic pain    Exercise/Treatments     Supine Bridge: 10 reps;Limitations Bridge Limitations: hold 10 seconds Straight Leg Raise: 15 reps;Limitations Straight Leg Raises Limitations: floating Sidelying Clam: 10 reps Hip Abduction: 10 reps Prone  Single Arm Raise: 10 reps Straight Leg Raise: 10 reps Opposite Arm/Leg Raise: 10 reps Quadruped    Manual Therapy Manual Therapy: Other (comment) Other Manual Therapy: MET for SI correction  Physical Therapy Assessment and Plan PT Assessment and Plan Clinical Impression Statement: Pt Lt SI with posterior rotation and outflare that was corrected using mm energy techniques.  Pt instructed in new stab exercises with manual and verbal cuing. Rehab Potential: Good PT Plan: begin lifting    Goals    Problem List Patient Active Problem List   Diagnosis Date Noted  . Leg weakness, bilateral 05/14/2013  . Difficulty walking 05/14/2013  . Diabetes mellitus 08/03/2011  . Obstructive sleep apnea 01/04/2011  . SORE THROAT 01/31/2010  . INSOMNIA 11/10/2009  . OSTEOARTHRITIS 08/10/2008  . ACUTE CYSTITIS 03/16/2008  . HYPERLIPIDEMIA 10/31/2007  . DEPRESSION 10/31/2007  . HYPERTENSION 10/31/2007  . HYPERTENSION, BENIGN ESSENTIAL 04/21/2007  . SHOULDER PAIN, BILATERAL  04/21/2007       GP    [name],CINDY 06/04/2013, 11:50 AM

## 2013-06-09 ENCOUNTER — Ambulatory Visit (HOSPITAL_COMMUNITY): Payer: BC Managed Care – PPO | Admitting: Physical Therapy

## 2013-06-09 ENCOUNTER — Telehealth (HOSPITAL_COMMUNITY): Payer: Self-pay

## 2013-06-16 ENCOUNTER — Ambulatory Visit (HOSPITAL_COMMUNITY): Payer: BC Managed Care – PPO | Admitting: Physical Therapy

## 2013-06-17 ENCOUNTER — Other Ambulatory Visit: Payer: Self-pay | Admitting: Internal Medicine

## 2013-06-18 ENCOUNTER — Ambulatory Visit (HOSPITAL_COMMUNITY)
Admission: RE | Admit: 2013-06-18 | Discharge: 2013-06-18 | Disposition: A | Discharge status: Home / Self Care | Payer: BC Managed Care – PPO | Source: Ambulatory Visit | Attending: Internal Medicine | Admitting: Internal Medicine

## 2013-06-18 DIAGNOSIS — IMO0001 Reserved for inherently not codable concepts without codable children: Secondary | ICD-10-CM | POA: Insufficient documentation

## 2013-06-18 DIAGNOSIS — I10 Essential (primary) hypertension: Secondary | ICD-10-CM | POA: Insufficient documentation

## 2013-06-18 DIAGNOSIS — M545 Low back pain, unspecified: Secondary | ICD-10-CM | POA: Insufficient documentation

## 2013-06-18 DIAGNOSIS — M25569 Pain in unspecified knee: Secondary | ICD-10-CM | POA: Insufficient documentation

## 2013-06-18 DIAGNOSIS — R262 Difficulty in walking, not elsewhere classified: Secondary | ICD-10-CM | POA: Insufficient documentation

## 2013-06-18 DIAGNOSIS — E119 Type 2 diabetes mellitus without complications: Secondary | ICD-10-CM | POA: Insufficient documentation

## 2013-06-18 DIAGNOSIS — R29898 Other symptoms and signs involving the musculoskeletal system: Secondary | ICD-10-CM

## 2013-06-18 NOTE — Telephone Encounter (Signed)
ok 

## 2013-06-18 NOTE — Telephone Encounter (Signed)
Rx last filled 08/28/2012. Pt last seen 12/2012.  Pls advise.

## 2013-06-18 NOTE — Progress Notes (Signed)
Physical Therapy Treatment Patient Details  Name: Kayla [name] MRN: [id number] Date of Birth: 1961-03-16  Today's Date: 06/18/2013 Time: 1130-1210 PT Time Calculation (min): 40 min Charge:  There ex 40 Visit#: 5 of 8  Re-eval: 06/13/13    Authorization: BCBS      Subjective: Symptoms/Limitations Symptoms: Pt states her Lt side of her back is hurting today Pain Assessment Pain Score: 3  Pain Location: Back Pain Orientation: Left Pain Type: Chronic pain Pain Onset: More than a month ago   Exercise/Treatments   Stretches Active Hamstring Stretch: 3 reps;30 seconds Single Knee to Chest Stretch: 3 reps;30 seconds Quad Stretch: 3 reps;30 seconds Standing Other Standing Lumbar Exercises: lift ball from 12" to raising above head   Supine Dead Bug: 10 reps Straight Leg Raise: 10 reps Straight Leg Raises Limitations: floating  Large Ball Abdominal Isometric: 10 reps Sidelying Clam: 10 reps Hip Abduction: 10 reps;Weights Hip Abduction Weights (lbs): 4# Prone  Single Arm Raise: 10 reps Straight Leg Raise: 10 reps Opposite Arm/Leg Raise: 10 reps Other Prone Lumbar Exercises: B shld ext with 2#   Physical Therapy Assessment and Plan PT Assessment and Plan Clinical Impression Statement: Pt has difficulty with proper stabilization needing both verbal and manual cuing to perform exercises with proper technique.  Pt added prone shld extension as well as ball ex to improve core stength PT Plan: begin lifting      Problem List Patient Active Problem List   Diagnosis Date Noted  . Leg weakness, bilateral 05/14/2013  . Difficulty walking 05/14/2013  . Diabetes mellitus 08/03/2011  . Obstructive sleep apnea 01/04/2011  . SORE THROAT 01/31/2010  . INSOMNIA 11/10/2009  . OSTEOARTHRITIS 08/10/2008  . ACUTE CYSTITIS 03/16/2008  . HYPERLIPIDEMIA 10/31/2007  . DEPRESSION 10/31/2007  . HYPERTENSION 10/31/2007  . HYPERTENSION, BENIGN ESSENTIAL 04/21/2007  . SHOULDER  PAIN, BILATERAL 04/21/2007     GP    Raynaldo Falco,CINDY 06/18/2013, 1:09 PM

## 2013-06-18 NOTE — Progress Notes (Signed)
Physical Therapy Treatment Patient Details  Name: Kayla [name] MRN: [id number] Date of Birth: 1961/07/01  Today's Date: 06/18/2013 Time: 9629-5284 PT Time Calculation (min): 45 min  Visit#: 5 of 8  Re-eval: 06/13/13    Authorization: BCBS  Subjective: Symptoms/Limitations Symptoms: Pt states her knee continues to do better Pertinent History: Rt TKR 01/23/2011 Pain Assessment Currently in Pain?: No/denies Pain Score: 3  Pain Location: Back Pain Orientation: Left Pain Type: Chronic pain   Exercise/Treatments   Aerobic Stationary Bike: Nustep Level 3 x 8' Machines for Strengthening Cybex Knee Extension: 3.0 PL x 10  back at  4 Cybex Knee Flexion: 5 Pl x 10 seat at 4 Cybex Leg Press: 7.5 Pl x 10   Standing Functional Squat: 10 reps Wall Squat: 10 reps Other Standing Knee Exercises: bend down to pick ball off 12" step up to toes x 5. Terminal knee strengthening x15 Rocker board x 2' Physical Therapy Assessment and Plan PT Assessment and Plan Clinical Impression Statement: Pt improving with knee strength.  Pt add cybex machines to routine as well as functional squatting. PT Plan: Pt to begin steps in department    Goals  progressing  Problem List Patient Active Problem List   Diagnosis Date Noted  . Leg weakness, bilateral 05/14/2013  . Difficulty walking 05/14/2013  . Diabetes mellitus 08/03/2011  . Obstructive sleep apnea 01/04/2011  . SORE THROAT 01/31/2010  . INSOMNIA 11/10/2009  . OSTEOARTHRITIS 08/10/2008  . ACUTE CYSTITIS 03/16/2008  . HYPERLIPIDEMIA 10/31/2007  . DEPRESSION 10/31/2007  . HYPERTENSION 10/31/2007  . HYPERTENSION, BENIGN ESSENTIAL 04/21/2007  . SHOULDER PAIN, BILATERAL 04/21/2007       GP    Riannon Mukherjee,CINDY 06/18/2013, 3:15 PM

## 2013-06-19 NOTE — Telephone Encounter (Signed)
Rx called in to pharmacy. 

## 2013-06-23 ENCOUNTER — Ambulatory Visit (HOSPITAL_COMMUNITY)
Admission: RE | Admit: 2013-06-23 | Discharge: 2013-06-23 | Disposition: A | Discharge status: Home / Self Care | Payer: BC Managed Care – PPO | Source: Ambulatory Visit | Attending: Internal Medicine | Admitting: Internal Medicine

## 2013-06-23 NOTE — Evaluation (Signed)
Physical Therapy Re-evaluation  Patient Details  Name: Kayla [name] MRN: [id number] Date of Birth: September 05, 1961  Today's Date: 06/23/2013 Time: 1100-1135 PT Time Calculation (min): 35 min Charges: Therex x 10' Manual x 10' Self-care x 10'              Visit#: 6 of 8  Re-eval: 07/21/13 Assessment Diagnosis:Back Pain Surgical Date: 06/04/12  Authorization: BCBS     Past Medical History:  Past Medical History  Diagnosis Date  . Depressive disorder, not elsewhere classified   . Mitral valve prolapse   . Factor II deficiency     "prothrombin gene factor II" (06/05/2012); states has had no problems  . PTSD (post-traumatic stress disorder)   . Headache     due to allergies  . Overactive bladder   . Complication of anesthesia     hard to wake up after surg. 06/04/2012 and CO2 went up to 78  . Hyperlipidemia   . Carpal tunnel syndrome of right wrist 10/2012  . Exertional dyspnea     occasionally  . OSA on CPAP   . Type II diabetes mellitus     NIDDM  . Heart murmur     states no problems  . Osteoarthritis     knees   Past Surgical History:  Past Surgical History  Procedure Laterality Date  . Total abdominal hysterectomy  08/27/2001  . Lasik      bilateral  . Knee arthroscopy w/ meniscectomy Left 08/16/2011    partial medial  . Total knee arthroplasty  02/06/2012    Procedure: TOTAL KNEE ARTHROPLASTY;  Surgeon: Loreta Ave, MD;  Location: [facility] OR;  Service: Orthopedics;  Laterality: Right;  total knee right side  . Knee arthroscopy Right 04/01/2008  . Shoulder arthroscopy w/ rotator cuff repair Left 2004  . Vaginal hysterectomy  1980's  . Total knee arthroplasty  06/04/2012    Procedure: TOTAL KNEE ARTHROPLASTY;  Surgeon: Loreta Ave, MD;  Location: [facility] OR;  Service: Orthopedics;  Laterality: Left;  . Pubovaginal sling  08/27/2001    cysto; suprapubic tube placement  . Breast biopsy Left 04/25/2001  . Carpal tunnel release Right 10/24/2012    Procedure: CARPAL  TUNNEL RELEASE;  Surgeon: Loreta Ave, MD;  Location: [facility];  Service: Orthopedics;  Laterality: Right;  RIGHT CARPAL TUNNEL RELEASE    Subjective Symptoms/Limitations Symptoms: Pt states that her back hurts most when standing. Pain Assessment Currently in Pain?: Yes Pain Score: 2  Pain Location: Knee Pain Orientation: Left  Sensation/Coordination/Flexibility/Functional Tests Functional Tests Functional Tests: LEFS 29/80 (was 15/80)  RLE Strength Right Hip Flexion:  (4+/5 was 4/5 (05/14/13)) Right Hip Extension: 4/5 (was 3/5 (05/14/13)) Right Hip ABduction:  (4+/5 was 4/5 (05/14/13)) Right Hip ADduction:  (4+/5 was 3+/5 (05/14/13)) Right Knee Flexion: 5/5 (was 3+/5 (05/14/13)) Right Knee Extension: 5/5 (was 3+/5 (05/14/13)) Right Ankle Dorsiflexion: 5/5 (was 3/5 (05/14/13)) LLE AROM (degrees) Left Knee Extension: 0 Left Knee Flexion: 120 LLE Strength Left Hip Flexion:  (4+/5 was 3/5 (05/14/13)) Left Hip Extension: 4/5 (was 3/5 (05/14/13)) Left Hip ABduction:  (4+/5 was 3/5 (05/14/13)) Left Hip ADduction:  (4+/5 was 3/5 (05/14/13)) Left Knee Flexion: 5/5 (was 3/5 (05/14/13)) Left Knee Extension: 5/5 Left Ankle Dorsiflexion: 5/5 (was 3/5 (05/14/13)) Lumbar AROM Lumbar Extension: decreased 10% with no change (was decreased 10% (05/14/13)) Lumbar - Right Side Bend: wnl Lumbar - Left Side Bend: wnl (was decreased 20%) Lumbar - Right Rotation: wnl Lumbar - Left Rotation: wnl  Exercise/Treatments Aerobic Tread Mill: 10'@1 .4 L3 incline following MET  Manual Therapy Other Manual Therapy: MET to correct right outflare  Physical Therapy Assessment and Plan PT Assessment and Plan Clinical Impression Statement: Pt is progressing well toward goals. Pt continues to be limited by pain in low back and radicular sx. Pt displays improve LE strength and lumbar ROM. Pt would benefit from continuing skilled PT to decrease back pain and improve core stability. PT Plan: Recommend to continue  skilled PT 1x/week x 4 more weeks.    Goals Home Exercise Program Pt/caregiver will Perform Home Exercise Program: For increased strengthening;For increased ROM PT Short Term Goals Time to Complete Short Term Goals: 2 weeks PT Short Term Goal 1: Pt to be able to tolerate standing for 30 minutes to be able to make a small meal (15' max) PT Short Term Goal 1 - Progress: Progressing toward goal PT Short Term Goal 2: Pt mm strength to be improved by one grade to allow the above to occur PT Short Term Goal 2 - Progress: Partly met PT Short Term Goal 3: Pt back pain to be no greater than a 4/10 80% of the day PT Short Term Goal 3 - Progress: Met PT Short Term Goal 4: Pt to be able to sit for 30 mintues with comfort to be able to enjoy a meal PT Short Term Goal 4 - Progress: Progressing toward goal PT Long Term Goals Time to Complete Long Term Goals: 4 weeks PT Long Term Goal 1: Pt to be I in advance HEP PT Long Term Goal 1 - Progress: Met PT Long Term Goal 2: Pt strength to be improved one grade to allow pt to stand for 40 minutes to make a normal meal. PT Long Term Goal 2 - Progress: Partly met Long Term Goal 3: Pt to be able to sit with comfort for an hour to travel or watch a show Long Term Goal 3 Progress: Progressing toward goal Long Term Goal 4: Pt pain to be no greater than a 2/10 80% of the day Long Term Goal 4 Progress: Progressing toward goal PT Long Term Goal 5: Pt to be able to sleep throughout the night Long Term Goal 5 Progress: Progressing toward goal PT Long Term Goal 6: Pt to be able to walk for 40 mintues for better health habits (Pt can walk 15' max)  Problem List Patient Active Problem List   Diagnosis Date Noted  . Leg weakness, bilateral 05/14/2013  . Difficulty walking 05/14/2013  . Diabetes mellitus 08/03/2011  . Obstructive sleep apnea 01/04/2011  . SORE THROAT 01/31/2010  . INSOMNIA 11/10/2009  . OSTEOARTHRITIS 08/10/2008  . ACUTE CYSTITIS 03/16/2008  .  HYPERLIPIDEMIA 10/31/2007  . DEPRESSION 10/31/2007  . HYPERTENSION 10/31/2007  . HYPERTENSION, BENIGN ESSENTIAL 04/21/2007  . SHOULDER PAIN, BILATERAL 04/21/2007    PT - End of Session Activity Tolerance: Patient tolerated treatment well General Behavior During Therapy: [facility] for tasks assessed/performed  Seth Bake, PTA 06/23/2013, 12:18 PM  Physician Documentation Your signature is required to indicate approval of the treatment plan as stated above.  Please sign and either send electronically or make a copy of this report for your files and return this physician signed original.   Please mark one 1.__approve of plan  2. ___approve of plan with the following conditions.   ______________________________  _____________________ Physician Signature                                                                                                             Date

## 2013-06-23 NOTE — Evaluation (Addendum)
Physical Therapy Re-evaluation  Patient Details  Name: Kayla [name] MRN: [id number] Date of Birth: Jan 28, 1961  Today's Date: 06/23/2013 Time: 1020-1100 PT Time Calculation (min): 40 min Charges: MMT x 1 Self-care x 20' Ultrasound x 8'              Visit#: 5 of 8  Re-eval: 06/13/13 Assessment Diagnosis: Lt TKR Surgical Date: 06/04/12  Authorization: BCBS     Past Medical History:  Past Medical History  Diagnosis Date  . Depressive disorder, not elsewhere classified   . Mitral valve prolapse   . Factor II deficiency     "prothrombin gene factor II" (06/05/2012); states has had no problems  . PTSD (post-traumatic stress disorder)   . Headache     due to allergies  . Overactive bladder   . Complication of anesthesia     hard to wake up after surg. 06/04/2012 and CO2 went up to 78  . Hyperlipidemia   . Carpal tunnel syndrome of right wrist 10/2012  . Exertional dyspnea     occasionally  . OSA on CPAP   . Type II diabetes mellitus     NIDDM  . Heart murmur     states no problems  . Osteoarthritis     knees   Past Surgical History:  Past Surgical History  Procedure Laterality Date  . Total abdominal hysterectomy  08/27/2001  . Lasik      bilateral  . Knee arthroscopy w/ meniscectomy Left 08/16/2011    partial medial  . Total knee arthroplasty  02/06/2012    Procedure: TOTAL KNEE ARTHROPLASTY;  Surgeon: Loreta Ave, MD;  Location: [facility] OR;  Service: Orthopedics;  Laterality: Right;  total knee right side  . Knee arthroscopy Right 04/01/2008  . Shoulder arthroscopy w/ rotator cuff repair Left 2004  . Vaginal hysterectomy  1980's  . Total knee arthroplasty  06/04/2012    Procedure: TOTAL KNEE ARTHROPLASTY;  Surgeon: Loreta Ave, MD;  Location: [facility] OR;  Service: Orthopedics;  Laterality: Left;  . Pubovaginal sling  08/27/2001    cysto; suprapubic tube placement  . Breast biopsy Left 04/25/2001  . Carpal tunnel release Right 10/24/2012    Procedure: CARPAL TUNNEL  RELEASE;  Surgeon: Loreta Ave, MD;  Location: [facility];  Service: Orthopedics;  Laterality: Right;  RIGHT CARPAL TUNNEL RELEASE    Subjective Symptoms/Limitations Symptoms: Pt states continued HEP compliance. Pt states her knee is gradually improving. Pain Assessment Currently in Pain?: Yes Pain Score: 3  Pain Location: Knee Pain Orientation: Left  Sensation/Coordination/Flexibility/Functional Tests Functional Tests Functional Tests: LEFS 29/80 (was 15/80)  RLE Strength Right Hip Flexion:  (4+/5 was 4/5 (05/14/13)) Right Hip Extension: 4/5 (was 3/5 (05/14/13)) Right Hip ABduction:  (4+/5 was 4/5 (05/14/13)) Right Hip ADduction:  (4+/5 was 3+/5 (05/14/13)) Right Knee Flexion: 5/5 (was 3+/5 (05/14/13)) Right Knee Extension: 5/5 (was 3+/5 (05/14/13)) Right Ankle Dorsiflexion: 5/5 (was 3/5 (05/14/13)) LLE AROM (degrees) Left Knee Extension: 0 Left Knee Flexion: 120 LLE Strength Left Hip Flexion:  (4+/5 was 3/5 (05/14/13)) Left Hip Extension: 4/5 (was 3/5 (05/14/13)) Left Hip ABduction:  (4+/5 was 3/5 (05/14/13)) Left Hip ADduction:  (4+/5 was 3/5 (05/14/13)) Left Knee Flexion: 5/5 (was 3/5 (05/14/13)) Left Knee Extension: 5/5 Left Ankle Dorsiflexion: 5/5 (was 3/5 (05/14/13))  Exercise/Treatments Modalities Modalities: Ultrasound Ultrasound Ultrasound Location: Left medial knee Ultrasound Parameters: 8'@ 1.0 w/cm2 Ultrasound Goals: Pain  Physical Therapy Assessment and Plan PT Assessment and Plan Clinical Impression Statement: Pt  displays improved strength and ROM in right knee. Inflammation and pain is still present. Pt reposts increased ease with functional activities. Pt is most limited by pain and inflammation. Pt would benefit from continuing skilled PT to decrease pain and inflammation and continue to increase functional strength. Began ultrasound this session to decrease pain. Order sent to MD for iontophoresis. PT Plan: Recommend to continue skilled PT 1x/week x 4  more weeks.    Goals  Home Exercise Program  Pt/caregiver will Perform Home Exercise Program: For increased strengthening  PT Short Term Goals  Time to Complete Short Term Goals: 2 weeks  PT Short Term Goal 1: Pt to be waking 2 times a night Met PT Short Term Goal 2: Pt mm strength to be improved by one grade to allow Pt to come from sit to stand from a high chair with greater ease Met PT Short Term Goal 3: Pt knee pain to be no greater than a 3/10 80% of the day. Progressing toward goal PT Short Term Goal 4: Pt to be able to sit for 30 mintues with comfort to be able to enjoy a meal Progressing toward goal PT Long Term Goals  Time to Complete Long Term Goals: 4 weeks  PT Long Term Goal 1: Pt to be I in advance HEP Progressing toward goal PT Long Term Goal 2: Pt strength to be improved one grade to allow pt to be able to get in and out of a car with ease Met Long Term Goal 3: Pt to be able to sit with comfort for an hour to travel or watch a show Progressing toward goal Long Term Goal 4: Pt to be able to go up and down steps wtihout diffiiculty Progressing toward goal PT Long Term Goal 5: Pt to be able to sleep throughout the night Progressing toward goal Additional PT Long Term Goals?: Yes  PT Long Term Goal 6: Pt to be able to walk for 40 mintues for better health habits Progressing toward goal  Problem List Patient Active Problem List   Diagnosis Date Noted  . Leg weakness, bilateral 05/14/2013  . Difficulty walking 05/14/2013  . Diabetes mellitus 08/03/2011  . Obstructive sleep apnea 01/04/2011  . SORE THROAT 01/31/2010  . INSOMNIA 11/10/2009  . OSTEOARTHRITIS 08/10/2008  . ACUTE CYSTITIS 03/16/2008  . HYPERLIPIDEMIA 10/31/2007  . DEPRESSION 10/31/2007  . HYPERTENSION 10/31/2007  . HYPERTENSION, BENIGN ESSENTIAL 04/21/2007  . SHOULDER PAIN, BILATERAL 04/21/2007    PT - End of Session Activity Tolerance: Patient tolerated treatment well General Behavior During Therapy:  [facility] for tasks assessed/performed  Seth Bake, PTA 06/23/2013, 11:50 AM  Physician Documentation Your signature is required to indicate approval of the treatment plan as stated above.  Please sign and either send electronically or make a copy of this report for your files and return this physician signed original.   Please mark one 1.__approve of plan  2. ___approve of plan with the following conditions.   ______________________________                                                          _____________________ Physician Signature  Date

## 2013-06-25 ENCOUNTER — Ambulatory Visit (HOSPITAL_COMMUNITY): Payer: BC Managed Care – PPO | Admitting: *Deleted

## 2013-06-29 ENCOUNTER — Encounter: Payer: Self-pay | Admitting: Internal Medicine

## 2013-06-30 ENCOUNTER — Ambulatory Visit (HOSPITAL_COMMUNITY)
Admission: RE | Admit: 2013-06-30 | Discharge: 2013-06-30 | Disposition: A | Discharge status: Home / Self Care | Payer: BC Managed Care – PPO | Source: Ambulatory Visit | Attending: Internal Medicine | Admitting: Internal Medicine

## 2013-06-30 NOTE — Progress Notes (Signed)
Physical Therapy Treatment Patient Details  Name: Kayla [name] MRN: [id number] Date of Birth: 16-Mar-1961  Today's Date: 06/30/2013 Time: 1018-1100 PT Time Calculation (min): 42 min Charges: Therex x 30' [place] x 8'  Visit#: 6 of 10  Re-eval: 07/21/13 Authorization: BCBS  Authorization Visit#: 6 of 10   Subjective: Symptoms/Limitations Symptoms: Pt states that ultrasound decreased swelling and pain. Pain Assessment Currently in Pain?: Yes Pain Score: 3  Pain Location: Knee Pain Orientation: Left  Exercise/Treatments Aerobic Stationary Bike: 6'@2 .0 seat 7 to decrease tightness Machines for Strengthening Cybex Knee Extension: 3.0 PL x 10  back at  4 Cybex Knee Flexion: 6 Pl x 10  Cybex Leg Press: 8 Pl x 10 Standing Functional Squat: 10 reps Rocker Board: 2 minutes Supine Quad Sets: 10 reps;Limitations Quad Sets Limitations: 10" holds Straight Leg Raises: 10 reps;Left   Modalities Modalities: Ultrasound Ultrasound Ultrasound Location: Left medial knee Ultrasound Parameters: 8'@ 1.0 w/cm2 Ultrasound Goals: Pain  Physical Therapy Assessment and Plan PT Assessment and Plan Clinical Impression Statement: Pt competes therex well after initial cueing and demo. ultrasound continued secondary to decreased inflammation/pain after last treatment. ionto order has not been signed. It was refaxed to MD. Pt reports pain decreased to 2/10 at end of session. PT Plan: Continue to progress functional strength and decrease pain/inflammation.      Problem List Patient Active Problem List   Diagnosis Date Noted  . Leg weakness, bilateral 05/14/2013  . Difficulty walking 05/14/2013  . Diabetes mellitus 08/03/2011  . Obstructive sleep apnea 01/04/2011  . SORE THROAT 01/31/2010  . INSOMNIA 11/10/2009  . OSTEOARTHRITIS 08/10/2008  . ACUTE CYSTITIS 03/16/2008  . HYPERLIPIDEMIA 10/31/2007  . DEPRESSION 10/31/2007  . HYPERTENSION 10/31/2007  . HYPERTENSION, BENIGN ESSENTIAL  04/21/2007  . SHOULDER PAIN, BILATERAL 04/21/2007    PT - End of Session Activity Tolerance: Patient tolerated treatment well General Behavior During Therapy: [facility] for tasks assessed/performed  Seth Bake, PTA  06/30/2013, 11:21 AM

## 2013-06-30 NOTE — Progress Notes (Signed)
Physical Therapy Treatment Patient Details  Name: Kayla [name] MRN: [id number] Date of Birth: 05-13-1961  Today's Date: 06/30/2013 Time: 4098-1191 PT Time Calculation (min): 38 min Charges: Manual x 8' Therex x 28'  Visit#: 7 of 11  Re-eval: 07/21/13  Authorization: BCBS  Authorization Visit#: 7 of 11   Subjective: Symptoms/Limitations Symptoms: Pt states that back pain appears to be decreasing. Pain Assessment Currently in Pain?: Yes Pain Score: 3  Pain Location: Back Pain Orientation: Lower  Exercise/Treatments Aerobic Tread Mill: 5'@1 .3 L3 incline following MET Supine Ab Set: 10 reps Bridge: 10 reps;Limitations Bridge Limitations: hold 10 seconds Isometric Hip Flexion: 10 reps;5 seconds Sidelying Clam: 10 reps Hip Abduction: 10 reps;Limitations Hip Abduction Weights (lbs): focusing on correct form Prone  Opposite Arm/Leg Raise: 10 reps  Modalities Modalities: Ultrasound Manual Therapy Other Manual Therapy: MET to correct left outflare and left anterior rotation  Physical Therapy Assessment and Plan PT Assessment and Plan Clinical Impression Statement: MET competes to correct left outflare and left anterior rotation. Pt reports pain decrease to 1/10 after MET. After MET tx focus on improving core strength/stabilization. Pt completes therex well after initial cueing and demo. PT Plan: Continue to progress functional strength and decrease pain/inflammation.     Problem List Patient Active Problem List   Diagnosis Date Noted  . Leg weakness, bilateral 05/14/2013  . Difficulty walking 05/14/2013  . Diabetes mellitus 08/03/2011  . Obstructive sleep apnea 01/04/2011  . SORE THROAT 01/31/2010  . INSOMNIA 11/10/2009  . OSTEOARTHRITIS 08/10/2008  . ACUTE CYSTITIS 03/16/2008  . HYPERLIPIDEMIA 10/31/2007  . DEPRESSION 10/31/2007  . HYPERTENSION 10/31/2007  . HYPERTENSION, BENIGN ESSENTIAL 04/21/2007  . SHOULDER PAIN, BILATERAL 04/21/2007    PT - End  of Session Activity Tolerance: Patient tolerated treatment well General Behavior During Therapy: [facility] for tasks assessed/performed   Seth Bake, PTA 06/30/2013, 11:51 AM

## 2013-07-02 ENCOUNTER — Ambulatory Visit (HOSPITAL_COMMUNITY): Payer: BC Managed Care – PPO | Admitting: Physical Therapy

## 2013-07-07 ENCOUNTER — Ambulatory Visit (HOSPITAL_COMMUNITY)
Admission: RE | Admit: 2013-07-07 | Discharge: 2013-07-07 | Disposition: A | Discharge status: Home / Self Care | Payer: BC Managed Care – PPO | Source: Ambulatory Visit | Attending: Internal Medicine | Admitting: Internal Medicine

## 2013-07-07 DIAGNOSIS — R262 Difficulty in walking, not elsewhere classified: Secondary | ICD-10-CM

## 2013-07-07 DIAGNOSIS — R29898 Other symptoms and signs involving the musculoskeletal system: Secondary | ICD-10-CM

## 2013-07-07 NOTE — Progress Notes (Signed)
Physical Therapy Treatment Patient Details  Name: Kayla [name] MRN: [id number] Date of Birth: Mar 21, 1961  Today's Date: 07/07/2013 Time: 1020-1100 PT Time Calculation (min): 40 min There ex 1020-1100 Visit#: 8 of 11  Re-eval: 07/21/13    Authorization: BCBS   Subjective: Symptoms/Limitations Symptoms: Pt states that her back and knee are both bothering her a little more than ususal Pertinent History: Rt TKR 01/23/2011 Pain Assessment Pain Score: 4  Pain Location: Knee Pain Orientation: Left Pain Type: Chronic pain    Exercise/Treatments  Aerobic Stationary Bike: 8'@ 3.0 Machines for Strengthening Cybex Knee Extension: 2 Pl x 15 Cybex Knee Flexion: 6Pl x 15 Cybex Leg Press: 8Pl x 15   Standing SLS with Vectors: 3 x 30" B  Other Standing Knee Exercises: bend down to pick ball off 12" step up to toes x 5.    Physical Therapy Assessment and Plan PT Assessment and Plan Clinical Impression Statement: Added vector stance to improve balance and strength of Lt LE  PT Frequency: Min 2X/week PT Duration: 4 weeks PT Plan: Continue to progress functional strength and decrease pain/inflammation.     Goals  progressing  Problem List Patient Active Problem List   Diagnosis Date Noted  . Leg weakness, bilateral 05/14/2013  . Difficulty walking 05/14/2013  . Diabetes mellitus 08/03/2011  . Obstructive sleep apnea 01/04/2011  . SORE THROAT 01/31/2010  . INSOMNIA 11/10/2009  . OSTEOARTHRITIS 08/10/2008  . ACUTE CYSTITIS 03/16/2008  . HYPERLIPIDEMIA 10/31/2007  . DEPRESSION 10/31/2007  . HYPERTENSION 10/31/2007  . HYPERTENSION, BENIGN ESSENTIAL 04/21/2007  . SHOULDER PAIN, BILATERAL 04/21/2007    PT - End of Session Activity Tolerance: Patient tolerated treatment well  GP    RUSSELL,CINDY 07/07/2013, 11:52 AM

## 2013-07-07 NOTE — Progress Notes (Signed)
Physical Therapy Treatment Patient Details  Name: Kayla [name] MRN: [id number] Date of Birth: 10-08-1960  Today's Date: 07/07/2013 Time: 1100-1140 PT Time Calculation (min): 40 min Charge :  There ex 1100-1140 Visit#: 8 of 11  Re-eval: 07/21/13   Authorization: BCBS  Authorization Visit#: 8 of 11   Subjective: Symptoms/Limitations Symptoms: Pt states that her back is bothering her a little more today Pertinent History: Rt TKR 01/23/2011 Pain Assessment Currently in Pain?: Yes Pain Score: 4  Pain Location: Knee Pain Orientation: Left Pain Type: Chronic pain  Exercise/Treatments   Machines for Strengthening Cybex Lumbar Extension: Pl 3 x 10 Standing Wall Slides: 10 reps Scapular Retraction: Strengthening;10 reps;Theraband Theraband Level (Scapular Retraction): Level 3 (Green) Row: Strengthening;Both;Theraband Theraband Level (Row): Level 3 (Green) Shoulder Extension: Strengthening;Both;10 reps;Theraband Theraband Level (Shoulder Extension): Level 3 (Green) Other Standing Lumbar Exercises: wall pushup x 10; B UE flexion against wall for stability    Supine Straight Leg Raise: 10 reps;Limitations Straight Leg Raises Limitations: floating Large Ball Abdominal Isometric: 10 reps Large Ball Oblique Isometric: 10 reps Sidelying Clam: 10 reps Hip Abduction: 10 reps Prone  Single Arm Raise: 10 reps Straight Leg Raise: 10 reps Opposite Arm/Leg Raise: 10 reps Other Prone Lumbar Exercises: B shoulder flexion x10  Physical Therapy Assessment and Plan PT Assessment and Plan Clinical Impression Statement: Added wall push up; standing t-band exercises and UE flexion to improve core strength.  Pt back was pain free at the end of treatment. PT Frequency: Min 2X/week PT Duration: 4 weeks PT Plan: Begin lunging activites     Goals  progressing  Problem List Patient Active Problem List   Diagnosis Date Noted  . Leg weakness, bilateral 05/14/2013  . Difficulty  walking 05/14/2013  . Diabetes mellitus 08/03/2011  . Obstructive sleep apnea 01/04/2011  . SORE THROAT 01/31/2010  . INSOMNIA 11/10/2009  . OSTEOARTHRITIS 08/10/2008  . ACUTE CYSTITIS 03/16/2008  . HYPERLIPIDEMIA 10/31/2007  . DEPRESSION 10/31/2007  . HYPERTENSION 10/31/2007  . HYPERTENSION, BENIGN ESSENTIAL 04/21/2007  . SHOULDER PAIN, BILATERAL 04/21/2007    PT - End of Session Activity Tolerance: Patient tolerated treatment well General Behavior During Therapy: [facility] for tasks assessed/performed  GP    Latrica Clowers,CINDY 07/07/2013, 12:03 PM

## 2013-07-14 ENCOUNTER — Ambulatory Visit (HOSPITAL_COMMUNITY): Payer: BC Managed Care – PPO | Admitting: Physical Therapy

## 2013-07-17 ENCOUNTER — Ambulatory Visit (HOSPITAL_COMMUNITY)
Admission: RE | Admit: 2013-07-17 | Discharge: 2013-07-17 | Disposition: A | Discharge status: Home / Self Care | Payer: BC Managed Care – PPO | Source: Ambulatory Visit | Attending: Internal Medicine | Admitting: Internal Medicine

## 2013-07-17 DIAGNOSIS — M545 Low back pain, unspecified: Secondary | ICD-10-CM | POA: Insufficient documentation

## 2013-07-17 DIAGNOSIS — E119 Type 2 diabetes mellitus without complications: Secondary | ICD-10-CM | POA: Insufficient documentation

## 2013-07-17 DIAGNOSIS — M25569 Pain in unspecified knee: Secondary | ICD-10-CM | POA: Insufficient documentation

## 2013-07-17 DIAGNOSIS — R262 Difficulty in walking, not elsewhere classified: Secondary | ICD-10-CM | POA: Insufficient documentation

## 2013-07-17 DIAGNOSIS — I10 Essential (primary) hypertension: Secondary | ICD-10-CM | POA: Insufficient documentation

## 2013-07-17 DIAGNOSIS — IMO0001 Reserved for inherently not codable concepts without codable children: Secondary | ICD-10-CM | POA: Insufficient documentation

## 2013-07-17 DIAGNOSIS — R29898 Other symptoms and signs involving the musculoskeletal system: Secondary | ICD-10-CM

## 2013-07-17 NOTE — Progress Notes (Signed)
Physical Therapy Treatment Patient Details  Name: Kayla [name] MRN: [id number] Date of Birth: 09-21-1960  Today's Date: 07/17/2013 Time: 0810-0853 PT Time Calculation (min): 43 min Charge: there ex 810-853 Visit#: 9 of 11  Re-eval: 07/21/13    Authorization: BCBS      Authorization Visit#: 9 of 11   Subjective: Symptoms/Limitations Symptoms: Pt states that she can tell she is getting stronger. Pain Assessment Currently in Pain?: Yes Pain Score: 2  Pain Location: Knee Pain Orientation: Left     Exercise/Treatments   Aerobic Stationary Bike: Nuestep L 4 x 10:00 Machines for Strengthening Cybex Knee Extension: 3.0 Pl x 10 Cybex Knee Flexion: 7.0 x 10 Cybex Leg Press: 9PL x 10 Total Gym Leg Press: back 3.5 x 10 Standing Functional Squat:  (squat to get ball off 6" step then onto toes x 10) Wall Squat: 10 reps Lunge Walking - Round Trips:  (1 rt) Rocker Board: 2 minutes;Other (comment) Rocker Board Limitations: no hands SLS with Vectors: 3 x 10"  Other Standing Knee Exercises: lunge onto bosu ball 10     Physical Therapy Assessment and Plan PT Assessment and Plan Clinical Impression Statement: Added lunge walking as well as lunging onto bosu ball  PT Plan: reevaluate next treatment for probable D/C to [facility]       Problem List Patient Active Problem List   Diagnosis Date Noted  . Leg weakness, bilateral 05/14/2013  . Difficulty walking 05/14/2013  . Diabetes mellitus 08/03/2011  . Obstructive sleep apnea 01/04/2011  . SORE THROAT 01/31/2010  . INSOMNIA 11/10/2009  . OSTEOARTHRITIS 08/10/2008  . ACUTE CYSTITIS 03/16/2008  . HYPERLIPIDEMIA 10/31/2007  . DEPRESSION 10/31/2007  . HYPERTENSION 10/31/2007  . HYPERTENSION, BENIGN ESSENTIAL 04/21/2007  . SHOULDER PAIN, BILATERAL 04/21/2007       GP    RUSSELL,CINDY 07/17/2013, 8:51 AM

## 2013-07-21 ENCOUNTER — Ambulatory Visit (HOSPITAL_COMMUNITY): Payer: BC Managed Care – PPO | Admitting: Physical Therapy

## 2013-07-23 ENCOUNTER — Ambulatory Visit (HOSPITAL_COMMUNITY)
Admission: RE | Admit: 2013-07-23 | Discharge: 2013-07-23 | Disposition: A | Discharge status: Home / Self Care | Payer: BC Managed Care – PPO | Source: Ambulatory Visit | Attending: Internal Medicine | Admitting: Internal Medicine

## 2013-07-23 NOTE — Evaluation (Signed)
Physical Therapy Discharge Summary  Patient Details  Name: Kayla [name] MRN: [id number] Date of Birth: 1961/04/12  Today's Date: 07/23/2013 Time: 0910-0928 PT Time Calculation (min): 18 min Charges: MMT/ROMM x 1 Self care x 10'              Visit#: 9 of 11  Re-eval: 07/21/13 Assessment Diagnosis: Back pain Surgical Date: 06/04/12  Authorization: BCBS    Authorization Visit#: 9 of 11   Past Medical History:  Past Medical History  Diagnosis Date  . Depressive disorder, not elsewhere classified   . Mitral valve prolapse   . Factor II deficiency     "prothrombin gene factor II" (06/05/2012); states has had no problems  . PTSD (post-traumatic stress disorder)   . Headache     due to allergies  . Overactive bladder   . Complication of anesthesia     hard to wake up after surg. 06/04/2012 and CO2 went up to 78  . Hyperlipidemia   . Carpal tunnel syndrome of right wrist 10/2012  . Exertional dyspnea     occasionally  . OSA on CPAP   . Type II diabetes mellitus     NIDDM  . Heart murmur     states no problems  . Osteoarthritis     knees   Past Surgical History:  Past Surgical History  Procedure Laterality Date  . Total abdominal hysterectomy  08/27/2001  . Lasik      bilateral  . Knee arthroscopy w/ meniscectomy Left 08/16/2011    partial medial  . Total knee arthroplasty  02/06/2012    Procedure: TOTAL KNEE ARTHROPLASTY;  Surgeon: Loreta Ave, MD;  Location: [facility] OR;  Service: Orthopedics;  Laterality: Right;  total knee right side  . Knee arthroscopy Right 04/01/2008  . Shoulder arthroscopy w/ rotator cuff repair Left 2004  . Vaginal hysterectomy  1980's  . Total knee arthroplasty  06/04/2012    Procedure: TOTAL KNEE ARTHROPLASTY;  Surgeon: Loreta Ave, MD;  Location: [facility] OR;  Service: Orthopedics;  Laterality: Left;  . Pubovaginal sling  08/27/2001    cysto; suprapubic tube placement  . Breast biopsy Left 04/25/2001  . Carpal tunnel release Right 10/24/2012     Procedure: CARPAL TUNNEL RELEASE;  Surgeon: Loreta Ave, MD;  Location: [facility];  Service: Orthopedics;  Laterality: Right;  RIGHT CARPAL TUNNEL RELEASE    Subjective Symptoms/Limitations Symptoms: Pt states that back seems to be gradually getting better. Pain Assessment Currently in Pain?: Yes Pain Score: 3  Pain Location: Back Pain Orientation: Lower  Assessment RLE Strength Right Hip Flexion: 5/5 (was 4+/5 on 06/24/13) Right Hip Extension:  (4+/5 was 4/5 on 06/24/13) Right Hip ABduction: 5/5 (was 4+/5 on 06/24/13) Right Hip ADduction: 5/5 (was 4+/5 on 06/24/13) Right Knee Flexion: 5/5 (was 5/5 on 06/24/13) Right Knee Extension: 5/5 (was 5/5 on 06/24/13) Right Ankle Dorsiflexion: 5/5 (was 5/5 on 06/24/13) LLE AROM (degrees) Left Knee Extension: 0 (was 0 on 06/24/13) Left Knee Flexion: 120 (was 120 on 06/24/13) LLE Strength Left Hip Flexion: 5/5 (was 4+/5 on 06/24/13) Left Hip Extension:  (4+/5 was 4/5 on 06/24/13) Left Hip ABduction: 5/5 (was 4+/5 on 06/24/13) Left Hip ADduction: 5/5 (was 4+/5 on 06/24/13) Left Knee Flexion: 5/5 (was 5/5 on 06/24/13) Left Knee Extension: 5/5 (was 5/5 on 06/24/13) Left Ankle Dorsiflexion: 5/5 (was 5/5 on 06/24/13) Lumbar AROM Lumbar Extension: wnl (was decreased 10% on 06/24/13) Lumbar - Right Side Bend: wnl (was wnl on 06/24/13)  Lumbar - Left Side Bend: wnl (was wnl on 06/24/13) Lumbar - Right Rotation: wnl (was wnl on 06/24/13) Lumbar - Left Rotation: wnl (was wnl on 06/24/13)  Physical Therapy Assessment and Plan PT Assessment and Plan Clinical Impression Statement: Pt continues to progress slowly with gradual decrease in back pain. Pt home schedule limits her ability to come to therapy. Therapist recommends beginning trial at [facility] (2 week free trial voucher give). Pt is agreeable. Educated pt on the benefits of aquatic exercise. PT Plan: [facility].    Goals Home Exercise  Program Pt/caregiver will Perform Home Exercise Program: For increased strengthening;For increased ROM PT Short Term Goals Time to Complete Short Term Goals: 2 weeks PT Short Term Goal 1: Pt to be able to tolerate standing for 30 minutes to be able to make a small meal (15' max) PT Short Term Goal 1 - Progress: Progressing toward goal PT Short Term Goal 2: Pt mm strength to be improved by one grade to allow the above to occur PT Short Term Goal 2 - Progress: Met PT Short Term Goal 3: Pt back pain to be no greater than a 4/10 80% of the day PT Short Term Goal 3 - Progress: Met PT Short Term Goal 4: Pt to be able to sit for 30 mintues with comfort to be able to enjoy a meal (Pt can sit for 15') PT Short Term Goal 4 - Progress: Progressing toward goal PT Long Term Goals Time to Complete Long Term Goals: 4 weeks PT Long Term Goal 1: Pt to be I in advance HEP PT Long Term Goal 1 - Progress: Met PT Long Term Goal 2: Pt strength to be improved one grade to allow pt to stand for 40 minutes to make a normal meal. PT Long Term Goal 2 - Progress: Progressing toward goal Long Term Goal 3: Pt to be able to sit with comfort for an hour to travel or watch a show (20' max) Long Term Goal 3 Progress: Progressing toward goal Long Term Goal 4: Pt pain to be no greater than a 2/10 80% of the day (Pain is usually a 3/10 for 80% of the day) Long Term Goal 4 Progress: Met PT Long Term Goal 5: Pt to be able to sleep throughout the night Long Term Goal 5 Progress: Progressing toward goal PT Long Term Goal 6: Pt to be able to walk for 40 mintues for better health habits (Pt can walk 20' max)  Problem List Patient Active Problem List   Diagnosis Date Noted  . Leg weakness, bilateral 05/14/2013  . Difficulty walking 05/14/2013  . Diabetes mellitus 08/03/2011  . Obstructive sleep apnea 01/04/2011  . SORE THROAT 01/31/2010  . INSOMNIA 11/10/2009  . OSTEOARTHRITIS 08/10/2008  . ACUTE CYSTITIS 03/16/2008  .  HYPERLIPIDEMIA 10/31/2007  . DEPRESSION 10/31/2007  . HYPERTENSION 10/31/2007  . HYPERTENSION, BENIGN ESSENTIAL 04/21/2007  . SHOULDER PAIN, BILATERAL 04/21/2007    PT - End of Session Activity Tolerance: Patient tolerated treatment well General Behavior During Therapy: [facility] for tasks assessed/performed  Seth Bake, PTA  07/23/2013, 11:41 AM  Physician Documentation Your signature is required to indicate approval of the treatment plan as stated above.  Please sign and either send electronically or make a copy of this report for your files and return this physician signed original.   Please mark one 1.__approve of plan  2. ___approve of plan with the following conditions.   ______________________________  _____________________ Physician Signature                                                                                                             Date

## 2013-07-23 NOTE — Evaluation (Addendum)
Physical Therapy Discharge Summary (Left Knee)  Patient Details  Name: Kayla [name] MRN: [id number] Date of Birth: October 18, 1960  Today's Date: 07/23/2013 Time: 0850-0910 PT Time Calculation (min): 20 min Charges: MMT x 1 Self care x 15'              Visit#: 9 of 11  Re-eval: 07/21/13 Assessment Diagnosis: Lt TKR Surgical Date: 06/04/12  Authorization: BCBS    Authorization Visit#: 9 of 11   Past Medical History:  Past Medical History  Diagnosis Date  . Depressive disorder, not elsewhere classified   . Mitral valve prolapse   . Factor II deficiency     "prothrombin gene factor II" (06/05/2012); states has had no problems  . PTSD (post-traumatic stress disorder)   . Headache     due to allergies  . Overactive bladder   . Complication of anesthesia     hard to wake up after surg. 06/04/2012 and CO2 went up to 78  . Hyperlipidemia   . Carpal tunnel syndrome of right wrist 10/2012  . Exertional dyspnea     occasionally  . OSA on CPAP   . Type II diabetes mellitus     NIDDM  . Heart murmur     states no problems  . Osteoarthritis     knees   Past Surgical History:  Past Surgical History  Procedure Laterality Date  . Total abdominal hysterectomy  08/27/2001  . Lasik      bilateral  . Knee arthroscopy w/ meniscectomy Left 08/16/2011    partial medial  . Total knee arthroplasty  02/06/2012    Procedure: TOTAL KNEE ARTHROPLASTY;  Surgeon: Loreta Ave, MD;  Location: [facility] OR;  Service: Orthopedics;  Laterality: Right;  total knee right side  . Knee arthroscopy Right 04/01/2008  . Shoulder arthroscopy w/ rotator cuff repair Left 2004  . Vaginal hysterectomy  1980's  . Total knee arthroplasty  06/04/2012    Procedure: TOTAL KNEE ARTHROPLASTY;  Surgeon: Loreta Ave, MD;  Location: [facility] OR;  Service: Orthopedics;  Laterality: Left;  . Pubovaginal sling  08/27/2001    cysto; suprapubic tube placement  . Breast biopsy Left 04/25/2001  . Carpal tunnel release Right  10/24/2012    Procedure: CARPAL TUNNEL RELEASE;  Surgeon: Loreta Ave, MD;  Location: [facility];  Service: Orthopedics;  Laterality: Right;  RIGHT CARPAL TUNNEL RELEASE    Subjective Symptoms/Limitations Symptoms: Pt states that her knee feels more stable. Pain Assessment Currently in Pain?: Yes Pain Score: 3  Pain Location: Knee Pain Orientation: Left   Sensation/Coordination/Flexibility/Functional Tests Functional Tests Functional Tests: LEFS 30/80 (was 29/80)  LLE AROM (degrees) Left Knee Extension: 0 Left Knee Flexion: 120 LLE Strength Left Hip Flexion: 5/5 Left Hip Extension:  (4+/5) Left Hip ABduction: 5/5 Left Hip ADduction: 5/5 Left Knee Flexion: 5/5 Left Knee Extension: 5/5 Left Ankle Dorsiflexion: 5/5 Lumbar AROM Lumbar Extension: wnl Lumbar - Right Side Bend: wnl Lumbar - Left Side Bend: wnl Lumbar - Right Rotation: wnl Lumbar - Left Rotation: wnl  Physical Therapy Assessment and Plan PT Assessment and Plan Clinical Impression Statement: Pt has made slight gains with therapy. Pt's home schedule limits her availability to come to therapy. Therapist recommends beginning trial at [facility] (2 week free trial voucher give). Pt is agreeable. Pt appears to be most limited by pain and swelling in her knee. Educated pt on ways to decrease this. PT Plan: [facility]    Goals  Home Exercise Program Pt/caregiver will Perform Home Exercise Program: For increased strengthening PT Short Term Goals Time to Complete Short Term Goals: 2 weeks PT Short Term Goal 1: Pt to be waking 2 times a night PT Short Term Goal 1 - Progress: Met PT Short Term Goal 2: Pt mm strength to be improved by one grade to allow Pt to come from sit to stand from a high chair with greater ease PT Short Term Goal 2 - Progress: Met PT Short Term Goal 3: Pt knee pain to be no greater than a 3/10 80% of the day. PT Short Term Goal 4: Pt to be able to sit for 30 mintues  with comfort to be able to enjoy a meal (Pt can sit for 15') PT Short Term Goal 4 - Progress: Progressing toward goal PT Long Term Goals Time to Complete Long Term Goals: 4 weeks PT Long Term Goal 1: Pt to be I in advance HEP PT Long Term Goal 1 - Progress: Met PT Long Term Goal 2: Pt strength to be improved one grade to allow pt to be able to get in and out of a car with ease PT Long Term Goal 2 - Progress: Met Long Term Goal 3: Pt to be able to sit with comfort for an hour to travel or watch a show Long Term Goal 3 Progress: Progressing toward goal Long Term Goal 4: Pt to be able to go up and down steps wtihout diffiiculty Long Term Goal 4 Progress: Met PT Long Term Goal 5: Pt to be able to sleep throughout the night Long Term Goal 5 Progress: Progressing toward goal PT Long Term Goal 6: Pt to be able to walk for 40 mintues for better health habits (Pt can walk 20' max)  Problem List Patient Active Problem List   Diagnosis Date Noted  . Leg weakness, bilateral 05/14/2013  . Difficulty walking 05/14/2013  . Diabetes mellitus 08/03/2011  . Obstructive sleep apnea 01/04/2011  . SORE THROAT 01/31/2010  . INSOMNIA 11/10/2009  . OSTEOARTHRITIS 08/10/2008  . ACUTE CYSTITIS 03/16/2008  . HYPERLIPIDEMIA 10/31/2007  . DEPRESSION 10/31/2007  . HYPERTENSION 10/31/2007  . HYPERTENSION, BENIGN ESSENTIAL 04/21/2007  . SHOULDER PAIN, BILATERAL 04/21/2007    PT - End of Session Activity Tolerance: Patient tolerated treatment well General Behavior During Therapy: [facility] for tasks assessed/performed  Seth Bake, PTA  07/23/2013, 11:17 AM  Physician Documentation Your signature is required to indicate approval of the treatment plan as stated above.  Please sign and either send electronically or make a copy of this report for your files and return this physician signed original.   Please mark one 1.__approve of plan  2. ___approve of plan with the following  conditions.   ______________________________                                                          _____________________ Physician Signature  Date  

## 2013-09-01 ENCOUNTER — Ambulatory Visit (AMBULATORY_SURGERY_CENTER): Payer: Self-pay | Admitting: *Deleted

## 2013-09-01 VITALS — Ht 63.0 in | Wt 223.4 lb

## 2013-09-01 DIAGNOSIS — Z1211 Encounter for screening for malignant neoplasm of colon: Secondary | ICD-10-CM

## 2013-09-01 MED ORDER — MOVIPREP 100 G PO SOLR
ORAL | Status: DC
Start: 2013-09-01 — End: 2013-09-14

## 2013-09-01 NOTE — Progress Notes (Signed)
No allergies to eggs or soy. No problems with anesthesia.  

## 2013-09-04 ENCOUNTER — Other Ambulatory Visit: Payer: Self-pay | Admitting: Internal Medicine

## 2013-09-08 ENCOUNTER — Encounter: Payer: Self-pay | Admitting: Internal Medicine

## 2013-09-14 ENCOUNTER — Ambulatory Visit (AMBULATORY_SURGERY_CENTER): Payer: BC Managed Care – PPO | Admitting: Internal Medicine

## 2013-09-14 ENCOUNTER — Encounter: Payer: Self-pay | Admitting: Internal Medicine

## 2013-09-14 VITALS — BP 159/79 | HR 64 | Temp 96.5°F | Resp 15 | Ht 63.0 in | Wt 223.0 lb

## 2013-09-14 DIAGNOSIS — D126 Benign neoplasm of colon, unspecified: Secondary | ICD-10-CM

## 2013-09-14 DIAGNOSIS — Z1211 Encounter for screening for malignant neoplasm of colon: Secondary | ICD-10-CM

## 2013-09-14 LAB — GLUCOSE, CAPILLARY
GLUCOSE-CAPILLARY: 88 mg/dL (ref 70–99)
Glucose-Capillary: 93 mg/dL (ref 70–99)

## 2013-09-14 MED ORDER — SODIUM CHLORIDE 0.9 % IV SOLN: 500.0000 mL | INTRAVENOUS | Status: DC

## 2013-09-14 NOTE — Progress Notes (Signed)
Procedure ends, to recovery, report given and VSS. 

## 2013-09-14 NOTE — Progress Notes (Signed)
Called to room to assist during endoscopic procedure.  Patient ID and intended procedure confirmed with present staff. Received instructions for my participation in the procedure from the performing physician.  

## 2013-09-14 NOTE — Patient Instructions (Signed)
YOU HAD AN ENDOSCOPIC PROCEDURE TODAY AT THE [facility]: Refer to the procedure report that was given to you for any specific questions about what was found during the examination.  If the procedure report does not answer your questions, please call your gastroenterologist to clarify.  If you requested that your care partner not be given the details of your procedure findings, then the procedure report has been included in a sealed envelope for you to review at your convenience later.  YOU SHOULD EXPECT: Some feelings of bloating in the abdomen. Passage of more gas than usual.  Walking can help get rid of the air that was put into your GI tract during the procedure and reduce the bloating. If you had a lower endoscopy (such as a colonoscopy or flexible sigmoidoscopy) you may notice spotting of blood in your stool or on the toilet paper. If you underwent a bowel prep for your procedure, then you may not have a normal bowel movement for a few days.  DIET: Your first meal following the procedure should be a light meal and then it is ok to progress to your normal diet.  A half-sandwich or bowl of soup is an example of a good first meal.  Heavy or fried foods are harder to digest and may make you feel nauseous or bloated.  Likewise meals heavy in dairy and vegetables can cause extra gas to form and this can also increase the bloating.  Drink plenty of fluids but you should avoid alcoholic beverages for 24 hours.  ACTIVITY: Your care partner should take you home directly after the procedure.  You should plan to take it easy, moving slowly for the rest of the day.  You can resume normal activity the day after the procedure however you should NOT DRIVE or use heavy machinery for 24 hours (because of the sedation medicines used during the test).    SYMPTOMS TO REPORT IMMEDIATELY: A gastroenterologist can be reached at any hour.  During normal business hours, 8:30 AM to 5:00 PM Monday through Friday,  call [phone].  After hours and on weekends, please call the GI answering service at [phone] who will take a message and have the physician on call contact you.   Following lower endoscopy (colonoscopy or flexible sigmoidoscopy):  Excessive amounts of blood in the stool  Significant tenderness or worsening of abdominal pains  Swelling of the abdomen that is new, acute  Fever of 100F or higher    FOLLOW UP: If any biopsies were taken you will be contacted by phone or by letter within the next 1-3 weeks.  Call your gastroenterologist if you have not heard about the biopsies in 3 weeks.  Our staff will call the home number listed on your records the next business day following your procedure to check on you and address any questions or concerns that you may have at that time regarding the information given to you following your procedure. This is a courtesy call and so if there is no answer at the home number and we have not heard from you through the emergency physician on call, we will assume that you have returned to your regular daily activities without incident.  SIGNATURES/CONFIDENTIALITY: You and/or your care partner have signed paperwork which will be entered into your electronic medical record.  These signatures attest to the fact that that the information above on your After Visit Summary has been reviewed and is understood.  Full responsibility of the confidentiality   of this discharge information lies with you and/or your care-partner.   Polyp information given.  Dr.Pyrtle will advise you in a letter about timing for next colonsocopy after pathology results are reviewed.

## 2013-09-14 NOTE — Op Note (Signed)
[place]  [address] [place], [id number]   COLONOSCOPY PROCEDURE REPORT  PATIENT: Kayla, [name]  MR#: [id number] BIRTHDATE: 15-May-1961 , 52  yrs. old GENDER: Female ENDOSCOPIST: Jerene Bears, MD REFERRED IZ:[id number] Burnice Logan, M.D. PROCEDURE DATE:  09/14/2013 PROCEDURE:   Colonoscopy with cold biopsy polypectomy First Screening Colonoscopy - Avg.  risk and is 50 yrs.  old or older Yes.  Prior Negative Screening - Now for repeat screening. N/A  History of Adenoma - Now for follow-up colonoscopy & has been > or = to 3 yrs.  N/A  Polyps Removed Today? Yes. ASA CLASS:   Class III INDICATIONS:average risk screening and first colonoscopy. MEDICATIONS: MAC sedation, administered by CRNA and Propofol (Diprivan) 280 mg IV  DESCRIPTION OF PROCEDURE:   After the risks benefits and alternatives of the procedure were thoroughly explained, informed consent was obtained.  A digital rectal exam revealed no rectal mass.   The LB [id number]  endoscope was introduced through the anus and advanced to the cecum, which was identified by both the appendix and ileocecal valve. No adverse events experienced. The quality of the prep was good, using MoviPrep  The instrument was then slowly withdrawn as the colon was fully examined.   COLON FINDINGS: A sessile polyp measuring 4-5 mm in size was found in the sigmoid colon.  A polypectomy was performed with cold forceps.  The resection was complete and the polyp tissue was completely retrieved.   The colon mucosa was otherwise normal. Retroflexed views revealed no abnormalities. The time to cecum=1 minutes 37 seconds.  Withdrawal time=13 minutes 58 seconds.  The scope was withdrawn and the procedure completed. COMPLICATIONS: There were no complications.  ENDOSCOPIC IMPRESSION: 1.   Sessile polyp measuring 4-5 mm in size was found in the sigmoid colon; polypectomy was performed with cold forceps 2.   The colon mucosa  was otherwise normal  RECOMMENDATIONS: 1.  Await pathology results 2.  If the polyp removed today is proven to be an adenomatous (pre-cancerous) polyp, you will need a repeat colonoscopy in 5 years.  Otherwise you should continue to follow colorectal cancer screening guidelines for "routine risk" patients with colonoscopy in 10 years.  You will receive a letter within 1-2 weeks with the results of your biopsy as well as final recommendations.  Please call my office if you have not received a letter after 3 weeks.   eSigned:  Jerene Bears, MD 09/14/2013 9:37 AM   cc: The Patient and Marletta Lor, MD

## 2013-09-15 ENCOUNTER — Telehealth: Payer: Self-pay | Admitting: *Deleted

## 2013-09-15 NOTE — Telephone Encounter (Signed)
  Follow up Call-  Call back number 09/14/2013  Post procedure Call Back phone  # [phone]  Permission to leave phone message Yes     Patient questions:  Do you have a fever, pain , or abdominal swelling? no Pain Score  0 *  Have you tolerated food without any problems? yes  Have you been able to return to your normal activities? yes  Do you have any questions about your discharge instructions: Diet   no Medications  no Follow up visit  no  Do you have questions or concerns about your Care? no  Actions: * If pain score is 4 or above: No action needed, pain <4.

## 2013-09-17 ENCOUNTER — Encounter: Payer: Self-pay | Admitting: Internal Medicine

## 2013-09-23 ENCOUNTER — Other Ambulatory Visit: Payer: Self-pay | Admitting: Internal Medicine

## 2013-11-03 ENCOUNTER — Other Ambulatory Visit: Payer: Self-pay | Admitting: Internal Medicine

## 2013-12-04 ENCOUNTER — Other Ambulatory Visit: Payer: Self-pay | Admitting: Internal Medicine

## 2013-12-08 ENCOUNTER — Other Ambulatory Visit: Payer: Self-pay | Admitting: Internal Medicine

## 2014-01-12 ENCOUNTER — Telehealth: Payer: Self-pay | Admitting: Internal Medicine

## 2014-01-12 DIAGNOSIS — G4733 Obstructive sleep apnea (adult) (pediatric): Secondary | ICD-10-CM

## 2014-01-12 NOTE — Telephone Encounter (Signed)
lmtcb x1 Pt has not been seen since 2013. Will need OV.

## 2014-01-13 NOTE — Telephone Encounter (Signed)
Pppt scheduled 02/22/14 at 330 for sleep consult with CDY Aware to arrive 15-28mins prior to appt to fill out paperwork.  Nothing further needed.

## 2014-01-21 NOTE — Addendum Note (Signed)
Addended by: Clayborne Dana C on: 01/21/2014 12:02 PM   Modules accepted: Orders

## 2014-01-21 NOTE — Telephone Encounter (Signed)
I looked at this message and noticed that patient was not a new consult (not over 3 years since seen last). I also had order from CY to let patient have CPAP supplies for now. I called patient and spoke with her about the appointments and misunderstanding. I have cancelled the consult appt and scheduled the patient to see CY on Thursday 01-28-14. Order has been sent to [facility] for CPAP supplies dx OSA.

## 2014-01-28 ENCOUNTER — Encounter: Payer: Self-pay | Admitting: Internal Medicine

## 2014-01-28 ENCOUNTER — Encounter (INDEPENDENT_AMBULATORY_CARE_PROVIDER_SITE_OTHER): Payer: Self-pay

## 2014-01-28 ENCOUNTER — Ambulatory Visit (INDEPENDENT_AMBULATORY_CARE_PROVIDER_SITE_OTHER): Payer: BC Managed Care – PPO | Admitting: Internal Medicine

## 2014-01-28 VITALS — BP 132/76 | HR 94 | Ht 63.0 in | Wt 235.2 lb

## 2014-01-28 DIAGNOSIS — J302 Other seasonal allergic rhinitis: Secondary | ICD-10-CM

## 2014-01-28 DIAGNOSIS — G4700 Insomnia, unspecified: Secondary | ICD-10-CM

## 2014-01-28 DIAGNOSIS — G4733 Obstructive sleep apnea (adult) (pediatric): Secondary | ICD-10-CM

## 2014-01-28 DIAGNOSIS — J309 Allergic rhinitis, unspecified: Secondary | ICD-10-CM

## 2014-01-28 DIAGNOSIS — J3089 Other allergic rhinitis: Secondary | ICD-10-CM

## 2014-01-28 NOTE — Patient Instructions (Signed)
We can continue CPAP 9/ Advanced  For your eyelids, you could try an otc decongestant like Sudafed-PE, maybe just in the mornings to avoid sleep disturbance. Or you could try an otc allergy nasal spray for awhile like Flonase or Nasacort  Please call as needed

## 2014-01-28 NOTE — Progress Notes (Signed)
Subjective:    Patient ID: Kayla [name], female    DOB: 1960-12-29, 53 y.o.   MRN: [id number]  HPI 01/07/11- 37 yoF self referred for sleep medicine evaluation. She describes loud snoring, witnessed respiratory distress and daytme tiredness. Occasional ambien at bedtime. Occasional caffeine in day. No ENT surgery. Has considered sleep an issue for 10 years. See sleep questionnaire.  02/15/11- OSA / Insomnia Returns after sleep study  NPSG 01/30/11- Moderate OSA  AHI 19.9/ hr. CPAP 9 gave AHI 0/hr Zolpidem takes too long now to help her get to sleep and doesn't last through the night.  03/29/11-  68 yoF never smoker followed for OSA / Insomnia, complicated by HBP, depression Doing well, using CPAP all night every night. It makes a difference. Download for compliance is pending. Mask and pressure seem fine.  Zolpidem didn't work well for her difficulty initiating and maintaining sleep. Lunesta didn't help at all. We discussed a trial of clonazepam.   05/20/12- 51 yoF never smoker followed for OSA / Insomnia, complicated by HBP, depression Wears CPAP 9/Advanced every night for about 6 hours, nasal pillows mask. Difficulty initiating and maintaining sleep. Sleep hygiene reviewed. Bedtime 8:30 or 9 PM when she lies down with her granddaughter, then tries to sleep until 7 AM is probably longer than her needed sleep time. Failed zolpidem, Lunesta, clonazepam, none of which seemed to help provide comfortable sleep for that long.  01/28/14- 75 yoF never smoker followed for OSA / Insomnia, complicated by HBP, depression FOLLOWS FOR: Last seen 05-2012; Wears CPAP 9/ Advanced every night for about 6-8 hours.  Using melatonin and diphenhydramine to help sleep. Co-sleeps with granddaughter. New problem-concerned about periorbital puffiness for the past month without burning or itching. Mild nasal congestion.  ROS-see HPI Constitutional:   No-   weight loss, night sweats, fevers, chills, fatigue,  lassitude. HEENT:   No-  headaches, difficulty swallowing, tooth/dental problems, sore throat,       No-  sneezing, itching, ear ache, +nasal congestion, post nasal drip,  CV:  No-   chest pain, orthopnea, PND, swelling in lower extremities, anasarca,  dizziness, palpitations Resp: No-   shortness of breath with exertion or at rest.              No-   productive cough,  No non-productive cough,  No- coughing up of blood.              No-   change in color of mucus.  No- wheezing.   Skin: No-   rash or lesions. GI:  No-   heartburn, indigestion, abdominal pain, nausea, vomiting,  GU: . MS:  No-   joint pain or swelling.  . Neuro-     nothing unusual Psych:  No- change in mood or affect. No depression or anxiety.  No memory loss.  OBJ- Physical Exam General- Alert, Oriented, Affect-appropriate, Distress- none acute; overweight Skin- rash-none, lesions- none, excoriation- none Lymphadenopathy- none Head- atraumatic            Eyes- Gross vision intact, PERRLA, conjunctivae and secretions clear,+ mild periorbital                           puffiness-fat versus edema            Ears- Hearing, canals-normal            Nose- Clear, no-Septal dev, mucus, polyps, erosion, perforation  Throat- Mallampati II , mucosa clear , drainage- none, tonsils- atrophic Neck- flexible , trachea midline, no stridor , thyroid nl, carotid no bruit Chest - symmetrical excursion , unlabored           Heart/CV- RRR , no murmur , no gallop  , no rub, nl s1 s2                           - JVD- none , edema- none, stasis changes- none, varices- none           Lung- clear to P&A, wheeze- none, cough- none , dullness-none, rub- none           Chest wall-  Abd-  Br/ Gen/ Rectal- Not done, not indicated Extrem- cyanosis- none, clubbing, none, atrophy- none, strength- nl. Right knee surgical incision scar. Neuro- grossly intact to observation Assessment & Plan:

## 2014-02-22 ENCOUNTER — Other Ambulatory Visit: Payer: Self-pay | Admitting: Internal Medicine

## 2014-02-22 ENCOUNTER — Institutional Professional Consult (permissible substitution): Payer: BC Managed Care – PPO | Admitting: Internal Medicine

## 2014-03-21 DIAGNOSIS — J302 Other seasonal allergic rhinitis: Secondary | ICD-10-CM | POA: Insufficient documentation

## 2014-03-21 DIAGNOSIS — J3089 Other allergic rhinitis: Secondary | ICD-10-CM

## 2014-03-21 NOTE — Assessment & Plan Note (Signed)
She notices some swelling around her eyes but I can't tell if this is fat or edema secondary to mild nasal congestion. Plan-try Sudafed-PE in the morning, to avoid aggravating insomnia. Try Flonase

## 2014-03-21 NOTE — Assessment & Plan Note (Signed)
Good compliance and control.does not need pressure change.

## 2014-03-21 NOTE — Assessment & Plan Note (Signed)
We discussed use of melatonin and diphenhydramine as sleep aids

## 2014-03-25 ENCOUNTER — Other Ambulatory Visit: Payer: Self-pay | Admitting: Internal Medicine

## 2014-06-09 ENCOUNTER — Other Ambulatory Visit: Payer: Self-pay | Admitting: Internal Medicine

## 2014-06-15 ENCOUNTER — Ambulatory Visit: Payer: BC Managed Care – PPO | Admitting: Internal Medicine

## 2014-06-17 ENCOUNTER — Encounter: Payer: Self-pay | Admitting: Internal Medicine

## 2014-06-17 ENCOUNTER — Ambulatory Visit (INDEPENDENT_AMBULATORY_CARE_PROVIDER_SITE_OTHER): Payer: BC Managed Care – PPO | Admitting: Internal Medicine

## 2014-06-17 VITALS — BP 126/82 | HR 80 | Temp 98.3°F | Ht 63.0 in | Wt 228.0 lb

## 2014-06-17 DIAGNOSIS — E119 Type 2 diabetes mellitus without complications: Secondary | ICD-10-CM

## 2014-06-17 DIAGNOSIS — I10 Essential (primary) hypertension: Secondary | ICD-10-CM

## 2014-06-17 DIAGNOSIS — Z23 Encounter for immunization: Secondary | ICD-10-CM

## 2014-06-17 DIAGNOSIS — E7800 Pure hypercholesterolemia, unspecified: Secondary | ICD-10-CM

## 2014-06-17 LAB — LIPID PANEL
CHOL/HDL RATIO: 5
Cholesterol: 193 mg/dL (ref 0–200)
HDL: 41.1 mg/dL (ref >39.00)
LDL CALC: 116 mg/dL — AB (ref 0–99)
NONHDL: 151.9
Triglycerides: 179 mg/dL — ABNORMAL HIGH (ref 0.0–149.0)
VLDL: 35.8 mg/dL (ref 0.0–40.0)

## 2014-06-17 LAB — CBC WITH DIFFERENTIAL/PLATELET
BASOS ABS: 0.1 10*3/uL (ref 0.0–0.1)
Basophils Relative: 0.5 % (ref 0.0–3.0)
EOS PCT: 4.3 % (ref 0.0–5.0)
Eosinophils Absolute: 0.4 10*3/uL (ref 0.0–0.7)
HCT: 42.4 % (ref 36.0–46.0)
HEMOGLOBIN: 14 g/dL (ref 12.0–15.0)
LYMPHS ABS: 3 10*3/uL (ref 0.7–4.0)
Lymphocytes Relative: 31.4 % (ref 12.0–46.0)
MCHC: 33.1 g/dL (ref 30.0–36.0)
MCV: 86.8 fl (ref 78.0–100.0)
MONO ABS: 0.6 10*3/uL (ref 0.1–1.0)
Monocytes Relative: 6.3 % (ref 3.0–12.0)
NEUTROS PCT: 57.5 % (ref 43.0–77.0)
Neutro Abs: 5.6 10*3/uL (ref 1.4–7.7)
PLATELETS: 271 10*3/uL (ref 150.0–400.0)
RBC: 4.88 Mil/uL (ref 3.87–5.11)
RDW: 13.6 % (ref 11.5–15.5)
WBC: 9.7 10*3/uL (ref 4.0–10.5)

## 2014-06-17 LAB — MICROALBUMIN / CREATININE URINE RATIO
CREATININE, U: 137 mg/dL
Microalb Creat Ratio: 0.5 mg/g (ref 0.0–30.0)
Microalb, Ur: 0.7 mg/dL (ref 0.0–1.9)

## 2014-06-17 LAB — COMPREHENSIVE METABOLIC PANEL
ALK PHOS: 73 U/L (ref 39–117)
ALT: 25 U/L (ref 0–35)
AST: 19 U/L (ref 0–37)
Albumin: 3.8 g/dL (ref 3.5–5.2)
BILIRUBIN TOTAL: 0.5 mg/dL (ref 0.2–1.2)
BUN: 14 mg/dL (ref 6–23)
CO2: 28 mEq/L (ref 19–32)
CREATININE: 0.8 mg/dL (ref 0.4–1.2)
Calcium: 9.5 mg/dL (ref 8.4–10.5)
Chloride: 104 mEq/L (ref 96–112)
GFR: 77.89 mL/min (ref >60.00)
Glucose, Bld: 141 mg/dL — ABNORMAL HIGH (ref 70–99)
Potassium: 4.2 mEq/L (ref 3.5–5.1)
Sodium: 141 mEq/L (ref 135–145)
Total Protein: 7.6 g/dL (ref 6.0–8.3)

## 2014-06-17 LAB — HEMOGLOBIN A1C: Hgb A1c MFr Bld: 7.2 % — ABNORMAL HIGH (ref 4.6–6.5)

## 2014-06-17 LAB — TSH: TSH: 2.4 u[IU]/mL (ref 0.35–4.50)

## 2014-06-17 NOTE — Progress Notes (Signed)
Subjective:    Patient ID: Kayla [name], female    DOB: 1961-01-05, 53 y.o.   MRN: [id number]  HPI  Lab Results  Component Value Date   HGBA1C 6.4 12/17/2012    Wt Readings from Last 3 Encounters:  06/17/14 228 lb (103.42 kg)  01/28/14 235 lb 3.2 oz (106.686 kg)  09/14/13 223 lb (101.152 kg)    BP Readings from Last 3 Encounters:  06/17/14 126/82  01/28/14 132/76  09/14/13 84/35   53 year old patient who is seen today for followup.  She has type 2 diabetes.  She has done quite well and has enjoyed nice glycemic control.  Last hemoglobin A1c 6 point 4.  She has hypertension, which also has been stable.  She is on statin therapy 4.  Dyslipidemia.  Doing quite well.  No complaints.  There's been some modest weight loss since her last visit. No recent eye exam  Past Medical History  Diagnosis Date  . Depressive disorder, not elsewhere classified   . Mitral valve prolapse   . Factor II deficiency     "prothrombin gene factor II" (06/05/2012); states has had no problems  . PTSD (post-traumatic stress disorder)   . Headache(784.0)     due to allergies  . Overactive bladder   . Complication of anesthesia     hard to wake up after surg. 06/04/2012 and CO2 went up to 78  . Hyperlipidemia   . Carpal tunnel syndrome of right wrist 10/2012  . Exertional dyspnea     occasionally  . OSA on CPAP   . Type II diabetes mellitus     NIDDM  . Heart murmur     states no problems  . Osteoarthritis     knees    History   Social History  . Marital Status: Married    Spouse Name: N/A    Number of Children: 2  . Years of Education: N/A   Occupational History  . Curator    Social History Main Topics  . Smoking status: Never Smoker   . Smokeless tobacco: Never Used  . Alcohol Use: No  . Drug Use: No  . Sexual Activity: Yes   Other Topics Concern  . Not on file   Social History Narrative  . No narrative on file    Past Surgical History  Procedure  Laterality Date  . Total abdominal hysterectomy  08/27/2001  . Lasik      bilateral  . Knee arthroscopy w/ meniscectomy Left 08/16/2011    partial medial  . Total knee arthroplasty  02/06/2012    Procedure: TOTAL KNEE ARTHROPLASTY;  Surgeon: Ninetta Lights, MD;  Location: [facility];  Service: Orthopedics;  Laterality: Right;  total knee right side  . Knee arthroscopy Right 04/01/2008  . Shoulder arthroscopy w/ rotator cuff repair Left 2004  . Vaginal hysterectomy  1980's  . Total knee arthroplasty  06/04/2012    Procedure: TOTAL KNEE ARTHROPLASTY;  Surgeon: Ninetta Lights, MD;  Location: [facility];  Service: Orthopedics;  Laterality: Left;  . Pubovaginal sling  08/27/2001    cysto; suprapubic tube placement  . Breast biopsy Left 04/25/2001  . Carpal tunnel release Right 10/24/2012    Procedure: CARPAL TUNNEL RELEASE;  Surgeon: Ninetta Lights, MD;  Location: [facility];  Service: Orthopedics;  Laterality: Right;  RIGHT CARPAL TUNNEL RELEASE    Family History  Problem Relation Age of Onset  . Heart disease Father   . COPD Mother   .  Heart disease Mother   . Heart attack Brother   . Colon cancer Neg Hx     Allergies  Allergen Reactions  . Morphine And Related Shortness Of Breath and Itching    Current Outpatient Prescriptions on File Prior to Visit  Medication Sig Dispense Refill  . atorvastatin (LIPITOR) 20 MG tablet TAKE ONE TABLET BY MOUTH EVERY DAY  90 tablet  1  . bismuth subsalicylate (PEPTO-BISMOL TO-GO) 262 MG chewable tablet Chew 524 mg by mouth 2 (two) times daily.      . Loperamide HCl (IMODIUM PO) Take 2 tablets by mouth 2 (two) times daily.      Marland Kitchen LORazepam (ATIVAN) 0.5 MG tablet TAKE ONE TABLET BY MOUTH EVERY 8 HOURS AS NEEDED FOR ANXIETY  60 tablet  2  . meloxicam (MOBIC) 15 MG tablet TAKE ONE TABLET BY MOUTH ONCE DAILY **NEEDS PHYSICAL**  90 tablet  0  . metFORMIN (GLUCOPHAGE-XR) 500 MG 24 hr tablet TAKE TWO TABLETS BY MOUTH ONCE DAILY WITH  BREAKFAST   **NEEDS  PHYSICAL**  180 tablet  0  . methocarbamol (ROBAXIN) 500 MG tablet Take 1-2 tablets (500-1,000 mg total) by mouth every 6 (six) hours as needed (spasms).  60 tablet  0  . oxybutynin (DITROPAN XL) 15 MG 24 hr tablet TAKE ONE TABLET BY MOUTH EVERY DAY  90 tablet  3  . oxyCODONE-acetaminophen (PERCOCET) 5-325 MG per tablet Take 1-2 tablets by mouth every 4 (four) hours as needed for pain.  30 tablet  0  . sertraline (ZOLOFT) 100 MG tablet TAKE ONE TABLET BY MOUTH ONCE DAILY  90 tablet  0   No current facility-administered medications on file prior to visit.    BP 126/82  Pulse 80  Temp(Src) 98.3 F (36.8 C) (Oral)  Ht 5\' 3"  (1.6 m)  Wt 228 lb (103.42 kg)  BMI 40.40 kg/m2     Review of Systems  Constitutional: Negative.   HENT: Negative for congestion, dental problem, hearing loss, rhinorrhea, sinus pressure, sore throat and tinnitus.   Eyes: Negative for pain, discharge and visual disturbance.  Respiratory: Negative for cough and shortness of breath.   Cardiovascular: Negative for chest pain, palpitations and leg swelling.  Gastrointestinal: Negative for nausea, vomiting, abdominal pain, diarrhea, constipation, blood in stool and abdominal distention.  Genitourinary: Negative for dysuria, urgency, frequency, hematuria, flank pain, vaginal bleeding, vaginal discharge, difficulty urinating, vaginal pain and pelvic pain.  Musculoskeletal: Negative for arthralgias, gait problem and joint swelling.  Skin: Negative for rash.  Neurological: Negative for dizziness, syncope, speech difficulty, weakness, numbness and headaches.  Hematological: Negative for adenopathy.  Psychiatric/Behavioral: Negative for behavioral problems, dysphoric mood and agitation. The patient is not nervous/anxious.        Objective:   Physical Exam  Constitutional: She is oriented to person, place, and time. She appears well-developed and well-nourished.  HENT:  Head: Normocephalic.  Right Ear: External  ear normal.  Left Ear: External ear normal.  Mouth/Throat: Oropharynx is clear and moist.  Fundi normal  Eyes: Conjunctivae and EOM are normal. Pupils are equal, round, and reactive to light.  Neck: Normal range of motion. Neck supple. No thyromegaly present.  Cardiovascular: Normal rate, regular rhythm, normal heart sounds and intact distal pulses.   Pulmonary/Chest: Effort normal and breath sounds normal.  Abdominal: Soft. Bowel sounds are normal. She exhibits no mass. There is no tenderness.  Musculoskeletal: Normal range of motion.  Lymphadenopathy:    She has no cervical adenopathy.  Neurological: She is  alert and oriented to person, place, and time.  Skin: Skin is warm and dry. No rash noted.  Psychiatric: She has a normal mood and affect. Her behavior is normal.          Assessment & Plan:  Diabetes mellitus.  Well controlled.  We'll check a hemoglobin A1c Hypertension well controlled Obesity improved Dyslipidemia.  Continue statin therapy  Recheck 3 months

## 2014-06-17 NOTE — Patient Instructions (Signed)
Limit your sodium (Salt) intake   Please check your hemoglobin A1c every 3 months    It is important that you exercise regularly, at least 20 minutes 3 to 4 times per week.  If you develop chest pain or shortness of breath seek  medical attention.  You need to lose weight.  Consider a lower calorie diet and regular exercise. 

## 2014-06-17 NOTE — Progress Notes (Signed)
Pre visit review using our clinic review tool, if applicable. No additional management support is needed unless otherwise documented below in the visit note. 

## 2014-06-18 ENCOUNTER — Telehealth: Payer: Self-pay | Admitting: Internal Medicine

## 2014-06-18 NOTE — Telephone Encounter (Signed)
emmi emailed °

## 2014-07-03 ENCOUNTER — Other Ambulatory Visit: Payer: Self-pay | Admitting: Internal Medicine

## 2014-08-01 ENCOUNTER — Other Ambulatory Visit: Payer: Self-pay | Admitting: Internal Medicine

## 2014-09-01 ENCOUNTER — Other Ambulatory Visit: Payer: Self-pay | Admitting: Internal Medicine

## 2014-09-16 ENCOUNTER — Other Ambulatory Visit: Payer: Self-pay | Admitting: Internal Medicine

## 2014-09-23 ENCOUNTER — Ambulatory Visit (INDEPENDENT_AMBULATORY_CARE_PROVIDER_SITE_OTHER)
Admission: RE | Admit: 2014-09-23 | Discharge: 2014-09-23 | Disposition: A | Discharge status: Home / Self Care | Payer: BLUE CROSS/BLUE SHIELD | Source: Ambulatory Visit | Attending: Adult Health | Admitting: Adult Health

## 2014-09-23 ENCOUNTER — Encounter: Payer: Self-pay | Admitting: Adult Health

## 2014-09-23 ENCOUNTER — Ambulatory Visit (INDEPENDENT_AMBULATORY_CARE_PROVIDER_SITE_OTHER): Payer: BLUE CROSS/BLUE SHIELD | Admitting: Adult Health

## 2014-09-23 VITALS — BP 130/68 | HR 79 | Temp 98.2°F | Ht 63.0 in | Wt 222.6 lb

## 2014-09-23 DIAGNOSIS — J209 Acute bronchitis, unspecified: Secondary | ICD-10-CM

## 2014-09-23 DIAGNOSIS — E118 Type 2 diabetes mellitus with unspecified complications: Secondary | ICD-10-CM

## 2014-09-23 DIAGNOSIS — G4733 Obstructive sleep apnea (adult) (pediatric): Secondary | ICD-10-CM

## 2014-09-23 MED ORDER — HYDROCODONE-HOMATROPINE 5-1.5 MG/5ML PO SYRP: 5.0000 mL | ORAL_SOLUTION | Freq: Four times a day (QID) | ORAL | Status: DC | PRN

## 2014-09-23 MED ORDER — PREDNISONE 10 MG PO TABS: ORAL_TABLET | ORAL | Status: DC

## 2014-09-23 MED ORDER — AZITHROMYCIN 250 MG PO TABS
ORAL_TABLET | ORAL | Status: AC
Start: 2014-09-23 — End: 2014-09-28

## 2014-09-23 MED ORDER — LEVALBUTEROL HCL 0.63 MG/3ML IN NEBU
0.6300 mg | INHALATION_SOLUTION | Freq: Once | RESPIRATORY_TRACT | Status: AC
  Administered 2014-09-23: 0.63 mg via RESPIRATORY_TRACT

## 2014-09-23 NOTE — Assessment & Plan Note (Signed)
Asthmatic Bronchitis >Worsening bronchitis with minimal response to OTC  Xopenex Neb x 1  cxr to r/o PNA   Plan  Take Z-Pak take as directed. Prednisone taper over the next week Mucinex DM twice daily as needed for cough and congestion Fluids and rest Tylenol as needed Hydromet 1 teaspoon every 6 hours as needed for cough, may make you sleepy Restart C Pap at bedtime Chest x-ray today Please contact office for sooner follow up if symptoms do not improve or worsen or seek emergency care  follow up Dr. Annamaria Boots  As planned and As needed

## 2014-09-23 NOTE — Progress Notes (Signed)
Subjective:    Patient ID: Kayla [name], female    DOB: September 05, 1961, 54 y.o.   MRN: [id number]  HPI 01/07/11- 54 yoF self referred for sleep medicine evaluation. She describes loud snoring, witnessed respiratory distress and daytme tiredness. Occasional ambien at bedtime. Occasional caffeine in day. No ENT surgery. Has considered sleep an issue for 10 years. See sleep questionnaire.  02/15/11- OSA / Insomnia Returns after sleep study  NPSG 01/30/11- Moderate OSA  AHI 19.9/ hr. CPAP 9 gave AHI 0/hr Zolpidem takes too long now to help her get to sleep and doesn't last through the night.  03/29/11-  54 yoF never smoker followed for OSA / Insomnia, complicated by HBP, depression Doing well, using CPAP all night every night. It makes a difference. Download for compliance is pending. Mask and pressure seem fine.  Zolpidem didn't work well for her difficulty initiating and maintaining sleep. Lunesta didn't help at all. We discussed a trial of clonazepam.   05/20/12- 54 yoF never smoker followed for OSA / Insomnia, complicated by HBP, depression Wears CPAP 9/Advanced every night for about 6 hours, nasal pillows mask. Difficulty initiating and maintaining sleep. Sleep hygiene reviewed. Bedtime 8:30 or 9 PM when she lies down with her granddaughter, then tries to sleep until 7 AM is probably longer than her needed sleep time. Failed zolpidem, Lunesta, clonazepam, none of which seemed to help provide comfortable sleep for that long.  01/28/14- 54 yoF never smoker followed for OSA / Insomnia, complicated by HBP, depression FOLLOWS FOR: Last seen 05-2012; Wears CPAP 9/ Advanced every night for about 6-8 hours.  Using melatonin and diphenhydramine to help sleep. Co-sleeps with granddaughter. New problem-concerned about periorbital puffiness for the past month without burning or itching. Mild nasal congestion.  09/23/2014 Acute OV (OSA on CPAP , never smoker )  Complains of 2 weeks of productive cough,  congestion, wheezing, fever (101) and drainage . Has been using several otc cold meds with minimal help.  Not able to wear CPAP due to congestion and cough.  Cough is keeping her up at night.  Granddaughter is sick with cold.  No recent travel or abx use.  She denies any chest pain, orthopnea, PND, leg swelling, hemoptysis, or nausea, vomiting, diarrhea.    ROS-see HPI Constitutional:   No-   weight loss, night sweats, + fevers, chills, fatigue, lassitude. HEENT:   No-  headaches, difficulty swallowing, tooth/dental problems, sore throat,       No-  sneezing, itching, ear ache, +nasal congestion, post nasal drip,  CV:  No-   chest pain, orthopnea, PND, swelling in lower extremities, anasarca,  dizziness, palpitations Resp:  No hemoptysis  Skin: No-   rash or lesions. GI:  No-   heartburn, indigestion, abdominal pain, nausea, vomiting,  GU: . MS:  No-   joint pain or swelling.  . Neuro-     nothing unusual Psych:  No- change in mood or affect. No depression or anxiety.  No memory loss.  OBJ- Physical Exam GEN: A/Ox3; pleasant , NAD, well nourished   HEENT:  [place]/AT,  EACs-clear, TMs-wnl, NOSE-clear, THROAT-clear, no lesions, no postnasal drip or exudate noted.   NECK:  Supple w/ fair ROM; no JVD; normal carotid impulses w/o bruits; no thyromegaly or nodules palpated; no lymphadenopathy.  RESP  Exp Wheezes no accessory muscle use, no dullness to percussion  CARD:  RRR, no m/r/g  , no peripheral edema, pulses intact, no cyanosis or clubbing.  GI:   Soft & nt;  nml bowel sounds; no organomegaly or masses detected.  Musco: Warm bil, no deformities or joint swelling noted.   Neuro: alert, no focal deficits noted.    Skin: Warm, no lesions or rashes

## 2014-09-23 NOTE — Assessment & Plan Note (Signed)
Advised on Steroid Hyperglycemia

## 2014-09-23 NOTE — Patient Instructions (Signed)
Take Z-Pak take as directed. Prednisone taper over the next week Mucinex DM twice daily as needed for cough and congestion Fluids and rest Tylenol as needed Hydromet 1 teaspoon every 6 hours as needed for cough, may make you sleepy Restart C Pap at bedtime Chest x-ray today Please contact office for sooner follow up if symptoms do not improve or worsen or seek emergency care  follow up Dr. Annamaria Boots  As planned and As needed

## 2014-09-23 NOTE — Assessment & Plan Note (Signed)
Restart C Pap at bedtime Please contact office for sooner follow up if symptoms do not improve or worsen or seek emergency care  follow up Dr. Annamaria Boots  As planned and As needed

## 2014-09-23 NOTE — Addendum Note (Signed)
Addended by: Parke Poisson E on: 09/23/2014 02:27 PM   Modules accepted: Orders

## 2014-09-29 NOTE — Progress Notes (Signed)
Quick Note:  Appt has already been scheduled w/ TP on 09/30/14 w/ cxr prior ______

## 2014-09-30 ENCOUNTER — Encounter: Payer: Self-pay | Admitting: Adult Health

## 2014-09-30 ENCOUNTER — Ambulatory Visit (INDEPENDENT_AMBULATORY_CARE_PROVIDER_SITE_OTHER)
Admission: RE | Admit: 2014-09-30 | Discharge: 2014-09-30 | Disposition: A | Discharge status: Home / Self Care | Payer: BLUE CROSS/BLUE SHIELD | Source: Ambulatory Visit | Attending: Adult Health | Admitting: Adult Health

## 2014-09-30 ENCOUNTER — Ambulatory Visit (INDEPENDENT_AMBULATORY_CARE_PROVIDER_SITE_OTHER): Payer: BLUE CROSS/BLUE SHIELD | Admitting: Adult Health

## 2014-09-30 VITALS — BP 124/84 | HR 101 | Temp 98.1°F | Ht 63.0 in | Wt 225.0 lb

## 2014-09-30 DIAGNOSIS — J189 Pneumonia, unspecified organism: Secondary | ICD-10-CM

## 2014-09-30 DIAGNOSIS — G4733 Obstructive sleep apnea (adult) (pediatric): Secondary | ICD-10-CM

## 2014-09-30 NOTE — Patient Instructions (Signed)
Mucinex DM twice daily as needed for cough and congestion Fluids and rest Hydromet 1 teaspoon every 6 hours as needed for cough, may make you sleepy Restart C Pap at bedtime Please contact office for sooner follow up if symptoms do not improve or worsen or seek emergency care  Follow up Dr. Annamaria Boots  3-4 months and As needed

## 2014-09-30 NOTE — Progress Notes (Signed)
Subjective:    Patient ID: Kayla [name], female    DOB: [birth year], 55 y.o.   MRN: [id number]  HPI 01/07/11- 12 yoF self referred for sleep medicine evaluation. She describes loud snoring, witnessed respiratory distress and daytme tiredness. Occasional ambien at bedtime. Occasional caffeine in day. No ENT surgery. Has considered sleep an issue for 10 years. See sleep questionnaire.  02/15/11- OSA / Insomnia Returns after sleep study  NPSG 01/30/11- Moderate OSA  AHI 19.9/ hr. CPAP 9 gave AHI 0/hr Zolpidem takes too long now to help her get to sleep and doesn't last through the night.  03/29/11-  24 yoF never smoker followed for OSA / Insomnia, complicated by HBP, depression Doing well, using CPAP all night every night. It makes a difference. Download for compliance is pending. Mask and pressure seem fine.  Zolpidem didn't work well for her difficulty initiating and maintaining sleep. Lunesta didn't help at all. We discussed a trial of clonazepam.   05/20/12- 51 yoF never smoker followed for OSA / Insomnia, complicated by HBP, depression Wears CPAP 9/Advanced every night for about 6 hours, nasal pillows mask. Difficulty initiating and maintaining sleep. Sleep hygiene reviewed. Bedtime 8:30 or 9 PM when she lies down with her granddaughter, then tries to sleep until 7 AM is probably longer than her needed sleep time. Failed zolpidem, Lunesta, clonazepam, none of which seemed to help provide comfortable sleep for that long.  01/28/14- 27 yoF never smoker followed for OSA / Insomnia, complicated by HBP, depression FOLLOWS FOR: Last seen 05-2012; Wears CPAP 9/ Advanced every night for about 6-8 hours.  Using melatonin and diphenhydramine to help sleep. Co-sleeps with granddaughter. New problem-concerned about periorbital puffiness for the past month without burning or itching. Mild nasal congestion.  09/23/14 Acute OV (OSA on CPAP , never smoker )  Complains of 2 weeks of productive cough,  congestion, wheezing, fever (101) and drainage . Has been using several otc cold meds with minimal help.  Not able to wear CPAP due to congestion and cough.  Cough is keeping her up at night.  Granddaughter is sick with cold.  No recent travel or abx use.  She denies any chest pain, orthopnea, PND, leg swelling, hemoptysis, or nausea, vomiting, diarrhea. >>Zpack   09/30/2014 Follow up -PNA  Returns for 1 week follow up PNA.  Was seen last week , CXR showed RLL PNA .  Tx w/ Zpack . Says she is feeling much better.  Reports breathing is 75% improved since last ov.   Does still have cough, and occasional dyspnea and tightness. No chest pain, orthopnea, edema or hemoptysis .  Appetite is good. No n/v/d.  Trying to restart CPAP .    ROS-see HPI Constitutional:   No-   weight loss, night sweats, fevers, chills, fatigue, lassitude. HEENT:   No-  headaches, difficulty swallowing, tooth/dental problems, sore throat,       No-  sneezing, itching, ear ache, +nasal congestion, post nasal drip,  CV:  No-   chest pain, orthopnea, PND, swelling in lower extremities, anasarca,  dizziness, palpitations Resp:  No hemoptysis  Skin: No-   rash or lesions. GI:  No-   heartburn, indigestion, abdominal pain, nausea, vomiting,  GU: . MS:  No-   joint pain or swelling.  . Neuro-     nothing unusual Psych:  No- change in mood or affect. No depression or anxiety.  No memory loss.  OBJ- Physical Exam GEN: A/Ox3; pleasant , NAD, well nourished  HEENT:  [place]/AT,  EACs-clear, TMs-wnl, NOSE-clear, THROAT-clear, no lesions, no postnasal drip or exudate noted.   NECK:  Supple w/ fair ROM; no JVD; normal carotid impulses w/o bruits; no thyromegaly or nodules palpated; no lymphadenopathy.  RESP CTA , no wheezing no accessory muscle use, no dullness to percussion  CARD:  RRR, no m/r/g  , no peripheral edema, pulses intact, no cyanosis or clubbing.  GI:   Soft & nt; nml bowel sounds; no organomegaly or masses  detected.  Musco: Warm bil, no deformities or joint swelling noted.   Neuro: alert, no focal deficits noted.    Skin: Warm, no lesions or rashes  CXR : 09/30/2014   CLINICAL DATA: Pneumonia.  EXAM: CHEST 2 VIEW  COMPARISON: None.  FINDINGS: Mediastinum and hilar structures are normal. Interim near complete clearing of right lower lobe infiltrate. No pleural effusion or pneumothorax. Heart size normal. No acute bony abnormality.  IMPRESSION: Interim near complete clearing right lower lobe infiltrate.

## 2014-09-30 NOTE — Assessment & Plan Note (Signed)
Restart C Pap at bedtime Please contact office for sooner follow up if symptoms do not improve or worsen or seek emergency care  Follow up Dr. Annamaria Boots  3-4 months and As needed

## 2014-09-30 NOTE — Assessment & Plan Note (Signed)
Resolving PNA  CXR shows near resolution .   Plan  Mucinex DM twice daily as needed for cough and congestion Fluids and rest Hydromet 1 teaspoon every 6 hours as needed for cough, may make you sleepy Please contact office for sooner follow up if symptoms do not improve or worsen or seek emergency care  Follow up Dr. Annamaria Boots  3-4 months and As needed

## 2014-10-20 ENCOUNTER — Other Ambulatory Visit: Payer: Self-pay | Admitting: Internal Medicine

## 2014-11-19 ENCOUNTER — Encounter: Payer: Self-pay | Admitting: Internal Medicine

## 2014-11-19 ENCOUNTER — Ambulatory Visit (INDEPENDENT_AMBULATORY_CARE_PROVIDER_SITE_OTHER): Payer: BLUE CROSS/BLUE SHIELD | Admitting: Internal Medicine

## 2014-11-19 VITALS — BP 140/90 | HR 77 | Temp 98.2°F | Resp 20 | Ht 63.0 in | Wt 234.0 lb

## 2014-11-19 DIAGNOSIS — J302 Other seasonal allergic rhinitis: Secondary | ICD-10-CM

## 2014-11-19 DIAGNOSIS — E119 Type 2 diabetes mellitus without complications: Secondary | ICD-10-CM

## 2014-11-19 DIAGNOSIS — H01006 Unspecified blepharitis left eye, unspecified eyelid: Secondary | ICD-10-CM

## 2014-11-19 DIAGNOSIS — J309 Allergic rhinitis, unspecified: Secondary | ICD-10-CM

## 2014-11-19 DIAGNOSIS — J3089 Other allergic rhinitis: Principal | ICD-10-CM

## 2014-11-19 DIAGNOSIS — I10 Essential (primary) hypertension: Secondary | ICD-10-CM

## 2014-11-19 LAB — HEMOGLOBIN A1C: HEMOGLOBIN A1C: 7 % — AB (ref 4.6–6.5)

## 2014-11-19 MED ORDER — BACITRACIN-POLYMYXIN B 500-10000 UNIT/GM OP OINT: 1.0000 "application " | TOPICAL_OINTMENT | Freq: Four times a day (QID) | OPHTHALMIC | Status: DC

## 2014-11-19 NOTE — Progress Notes (Signed)
Subjective:    Patient ID: Kayla [name], female    DOB: August 31, 1961, 54 y.o.   MRN: [id number]  HPI 54 year old patient who has a history of type 2 diabetes.  She presents with a 2 day history of swelling and itching involving her left upper and lower eyelids.  No conjunctival redness or exudate.  No fever or other complaints She has type 2 diabetes.  She does have a history of seasonal and perennial allergic rhinitis No recent hemoglobin A1cl  Lab Results  Component Value Date   HGBA1C 7.2* 06/17/2014   Past Medical History  Diagnosis Date  . Depressive disorder, not elsewhere classified   . Mitral valve prolapse   . Factor II deficiency     "prothrombin gene factor II" (06/05/2012); states has had no problems  . PTSD (post-traumatic stress disorder)   . Headache(784.0)     due to allergies  . Overactive bladder   . Complication of anesthesia     hard to wake up after surg. 06/04/2012 and CO2 went up to 78  . Hyperlipidemia   . Carpal tunnel syndrome of right wrist 10/2012  . Exertional dyspnea     occasionally  . OSA on CPAP   . Type II diabetes mellitus     NIDDM  . Heart murmur     states no problems  . Osteoarthritis     knees    History   Social History  . Marital Status: Married    Spouse Name: N/A  . Number of Children: 2  . Years of Education: N/A   Occupational History  . Curator    Social History Main Topics  . Smoking status: Never Smoker   . Smokeless tobacco: Never Used  . Alcohol Use: No  . Drug Use: No  . Sexual Activity: Yes   Other Topics Concern  . Not on file   Social History Narrative    Past Surgical History  Procedure Laterality Date  . Total abdominal hysterectomy  08/27/2001  . Lasik      bilateral  . Knee arthroscopy w/ meniscectomy Left 08/16/2011    partial medial  . Total knee arthroplasty  02/06/2012    Procedure: TOTAL KNEE ARTHROPLASTY;  Surgeon: Ninetta Lights, MD;  Location: [place];  Service:  Orthopedics;  Laterality: Right;  total knee right side  . Knee arthroscopy Right 04/01/2008  . Shoulder arthroscopy w/ rotator cuff repair Left 2004  . Vaginal hysterectomy  1980's  . Total knee arthroplasty  06/04/2012    Procedure: TOTAL KNEE ARTHROPLASTY;  Surgeon: Ninetta Lights, MD;  Location: [place];  Service: Orthopedics;  Laterality: Left;  . Pubovaginal sling  08/27/2001    cysto; suprapubic tube placement  . Breast biopsy Left 04/25/2001  . Carpal tunnel release Right 10/24/2012    Procedure: CARPAL TUNNEL RELEASE;  Surgeon: Ninetta Lights, MD;  Location: [place];  Service: Orthopedics;  Laterality: Right;  RIGHT CARPAL TUNNEL RELEASE    Family History  Problem Relation Age of Onset  . Heart disease Father   . COPD Mother   . Heart disease Mother   . Heart attack Brother   . Colon cancer Neg Hx     Allergies  Allergen Reactions  . Morphine And Related Shortness Of Breath and Itching    Current Outpatient Prescriptions on File Prior to Visit  Medication Sig Dispense Refill  . atorvastatin (LIPITOR) 20 MG tablet TAKE ONE TABLET BY MOUTH EVERY  DAY 90 tablet 1  . bismuth subsalicylate (PEPTO-BISMOL TO-GO) 262 MG chewable tablet Chew 524 mg by mouth 2 (two) times daily.    Marland Kitchen estradiol (ESTRACE) 0.5 MG tablet Take 0.5 mg by mouth daily.    . Loperamide HCl (IMODIUM PO) Take 2 tablets by mouth 2 (two) times daily.    Marland Kitchen LORazepam (ATIVAN) 0.5 MG tablet TAKE ONE TABLET BY MOUTH EVERY 8 HOURS AS NEEDED FOR ANXIETY 60 tablet 2  . meloxicam (MOBIC) 15 MG tablet TAKE ONE TABLET BY MOUTH ONCE DAILY **NEEDS  PHYSICAL** 90 tablet 0  . metFORMIN (GLUCOPHAGE-XR) 500 MG 24 hr tablet TAKE TWO TABLETS BY MOUTH ONCE DAILY WITH  BREAKFAST  -  NEEDS  OFFICE  VISIT 180 tablet 1  . methocarbamol (ROBAXIN) 500 MG tablet Take 1-2 tablets (500-1,000 mg total) by mouth every 6 (six) hours as needed (spasms). 60 tablet 0  . oxybutynin (DITROPAN XL) 15 MG 24 hr tablet TAKE ONE TABLET  BY MOUTH ONCE DAILY 90 tablet 0  . oxyCODONE-acetaminophen (PERCOCET) 5-325 MG per tablet Take 1-2 tablets by mouth every 4 (four) hours as needed for pain. 30 tablet 0  . sertraline (ZOLOFT) 100 MG tablet TAKE ONE TABLET BY MOUTH ONCE DAILY 90 tablet 1   No current facility-administered medications on file prior to visit.    BP 140/90 mmHg  Pulse 77  Temp(Src) 98.2 F (36.8 C) (Oral)  Resp 20  Ht 5\' 3"  (1.6 m)  Wt 234 lb (106.142 kg)  BMI 41.46 kg/m2  SpO2 96%     .   Review of Systems  Constitutional: Negative.   HENT: Negative for congestion, dental problem, hearing loss, rhinorrhea, sinus pressure, sore throat and tinnitus.   Eyes: Positive for redness and itching. Negative for pain, discharge and visual disturbance.  Respiratory: Negative for cough and shortness of breath.   Cardiovascular: Negative for chest pain, palpitations and leg swelling.  Gastrointestinal: Negative for nausea, vomiting, abdominal pain, diarrhea, constipation, blood in stool and abdominal distention.  Genitourinary: Negative for dysuria, urgency, frequency, hematuria, flank pain, vaginal bleeding, vaginal discharge, difficulty urinating, vaginal pain and pelvic pain.  Musculoskeletal: Negative for joint swelling, arthralgias and gait problem.  Skin: Negative for rash.  Neurological: Negative for dizziness, syncope, speech difficulty, weakness, numbness and headaches.  Hematological: Negative for adenopathy.  Psychiatric/Behavioral: Negative for behavioral problems, dysphoric mood and agitation. The patient is not nervous/anxious.        Objective:   Physical Exam  Constitutional: She is oriented to person, place, and time. She appears well-developed and well-nourished.  HENT:  Head: Normocephalic.  Right Ear: External ear normal.  Left Ear: External ear normal.  Mouth/Throat: Oropharynx is clear and moist.  Eyes: Conjunctivae and EOM are normal. Pupils are equal, round, and reactive to  light.  The left lower and especially left upper lids erythematous and slightly swollen Conjunctivae clear  Neck: Normal range of motion. Neck supple. No thyromegaly present.  Cardiovascular: Normal rate, regular rhythm, normal heart sounds and intact distal pulses.   Pulmonary/Chest: Effort normal and breath sounds normal.  Abdominal: Soft. Bowel sounds are normal. She exhibits no mass. There is no tenderness.  Musculoskeletal: Normal range of motion.  Lymphadenopathy:    She has no cervical adenopathy.  Neurological: She is alert and oriented to person, place, and time.  Skin: Skin is warm and dry. No rash noted.  Psychiatric: She has a normal mood and affect. Her behavior is normal.  Assessment & Plan:    Blepharitis.  Will treat with cool compresses.  Continue antihistamines.  If symptoms worse.  Will treat with doxycycline Diabetes.  Will check a hemoglobin A1c Recheck 3 months

## 2014-11-19 NOTE — Patient Instructions (Signed)
Blepharitis Blepharitis is redness, soreness, and swelling (inflammation) of one or both eyelids. It may be caused by an allergic reaction or a bacterial infection. Blepharitis may also be associated with reddened, scaly skin (seborrhea) of the scalp and eyebrows. While you sleep, eye discharge may cause your eyelashes to stick together. Your eyelids may itch, burn, swell, and may lose their lashes. These will grow back. Your eyes may become sensitive. Blepharitis may recur and need repeated treatment. If this is the case, you may require further evaluation by an eye specialist (ophthalmologist). HOME CARE INSTRUCTIONS   Keep your hands clean.  Use a clean towel each time you dry your eyelids. Do not use this towel to clean other areas. Do not share a towel or makeup with anyone.  Wash your eyelids with warm water or warm water mixed with a small amount of baby shampoo. Do this twice a day or as often as needed.  Wash your face and eyebrows at least once a day.  Use warm compresses 2 times a day for 10 minutes at a time, or as directed by your caregiver.  Apply antibiotic ointment as directed by your caregiver.  Avoid rubbing your eyes.  Avoid wearing makeup until you get better.  Follow up with your caregiver as directed. SEEK IMMEDIATE MEDICAL CARE IF:   You have pain, redness, or swelling that gets worse or spreads to other parts of your face.  Your vision changes, or you have pain when looking at lights or moving objects.  You have a fever.  Your symptoms continue for longer than 2 to 4 days or become worse. MAKE SURE YOU:   Understand these instructions.  Will watch your condition.  Will get help right away if you are not doing well or get worse. Document Released: 08/24/2000 Document Revised: 11/19/2011 Document Reviewed: 10/04/2010 Exit[employer] Patient Information 2015 [employer]. This information is not intended to replace advice given to you by your health care  provider. Make sure you discuss any questions you have with your health care provider.  

## 2014-11-22 ENCOUNTER — Other Ambulatory Visit: Payer: Self-pay | Admitting: *Deleted

## 2014-11-22 MED ORDER — BACITRACIN-POLYMYXIN B 500-10000 UNIT/GM OP OINT
1.0000 | TOPICAL_OINTMENT | Freq: Four times a day (QID) | OPHTHALMIC | Status: DC
Start: 2014-11-22 — End: 2016-02-13

## 2014-12-21 ENCOUNTER — Other Ambulatory Visit: Payer: Self-pay | Admitting: Internal Medicine

## 2015-01-03 ENCOUNTER — Ambulatory Visit (INDEPENDENT_AMBULATORY_CARE_PROVIDER_SITE_OTHER): Payer: BLUE CROSS/BLUE SHIELD | Admitting: Internal Medicine

## 2015-01-03 ENCOUNTER — Encounter: Payer: Self-pay | Admitting: Internal Medicine

## 2015-01-03 VITALS — BP 126/70 | HR 80 | Ht 63.0 in | Wt 229.6 lb

## 2015-01-03 DIAGNOSIS — G4733 Obstructive sleep apnea (adult) (pediatric): Secondary | ICD-10-CM | POA: Diagnosis not present

## 2015-01-03 DIAGNOSIS — J189 Pneumonia, unspecified organism: Secondary | ICD-10-CM | POA: Diagnosis not present

## 2015-01-03 NOTE — Patient Instructions (Signed)
We can continue CPAP 9/ Advanced  The leg cramps might be from your disk disease, but sometimes they are caused by low potassium or magnesium levels. Try an otc potassium and magnesium supplement called Slow Mag  You might also try getting a little quinine, by drinking an orange-juice sized glass of Tonic water from the grocery store at supper. You can add a little tomato juice or orange juice to flavor if you want.

## 2015-01-03 NOTE — Progress Notes (Signed)
Subjective:    Patient ID: Kayla [name], female    DOB: [birth year], 54 y.o.   MRN: [id number]  HPI 01/07/11- 24 yoF self referred for sleep medicine evaluation. She describes loud snoring, witnessed respiratory distress and daytme tiredness. Occasional ambien at bedtime. Occasional caffeine in day. No ENT surgery. Has considered sleep an issue for 10 years. See sleep questionnaire.  02/15/11- OSA / Insomnia Returns after sleep study  NPSG 01/30/11- Moderate OSA  AHI 19.9/ hr. CPAP 9 gave AHI 0/hr Zolpidem takes too long now to help her get to sleep and doesn't last through the night.  03/29/11-  80 yoF never smoker followed for OSA / Insomnia, complicated by HBP, depression Doing well, using CPAP all night every night. It makes a difference. Download for compliance is pending. Mask and pressure seem fine.  Zolpidem didn't work well for her difficulty initiating and maintaining sleep. Lunesta didn't help at all. We discussed a trial of clonazepam.   05/20/12- 51 yoF never smoker followed for OSA / Insomnia, complicated by HBP, depression Wears CPAP 9/Advanced every night for about 6 hours, nasal pillows mask. Difficulty initiating and maintaining sleep. Sleep hygiene reviewed. Bedtime 8:30 or 9 PM when she lies down with her granddaughter, then tries to sleep until 7 AM is probably longer than her needed sleep time. Failed zolpidem, Lunesta, clonazepam, none of which seemed to help provide comfortable sleep for that long.  01/28/14- 5 yoF never smoker followed for OSA / Insomnia, complicated by HBP, depression FOLLOWS FOR: Last seen 05-2012; Wears CPAP 9/ Advanced every night for about 6-8 hours.  Using melatonin and diphenhydramine to help sleep. Co-sleeps with granddaughter. New problem-concerned about periorbital puffiness for the past month without burning or itching. Mild nasal congestion.  09/23/14 Acute OV (OSA on CPAP , never smoker )  Complains of 2 weeks of productive cough,  congestion, wheezing, fever (101) and drainage . Has been using several otc cold meds with minimal help.  Not able to wear CPAP due to congestion and cough.  Cough is keeping her up at night.  Granddaughter is sick with cold.  No recent travel or abx use.  She denies any chest pain, orthopnea, PND, leg swelling, hemoptysis, or nausea, vomiting, diarrhea. >>Zpack   09/30/2014 Follow up -PNA  Returns for 1 week follow up PNA.  Was seen last week , CXR showed RLL PNA .  Tx w/ Zpack . Says she is feeling much better.  Reports breathing is 75% improved since last ov.   Does still have cough, and occasional dyspnea and tightness. No chest pain, orthopnea, edema or hemoptysis .  Appetite is good. No n/v/d.  Trying to restart CPAP .   01/03/15-  54 yoF never smoker followed for OSA / Insomnia, complicated by HBP, depression, DM FOLLOWS FOR: Pt states she went back to using her CPAP 9/ Advanced machine-wears most every night (99%). DME is [facility]-will need to place order for download as patient has not had machine read through [facility] in a long time. Pt states she feels better other than being tired from the PNA back in 09-2014. Pt having leg cramps that wake her in the middle of the night. CXR 09/30/14 IMPRESSION: Interim near complete clearing right lower lobe infiltrate. Electronically Signed  By: Marcello Moores Register  On: 09/30/2014 09:10  ROS-see HPI Constitutional:   No-   weight loss, night sweats, fevers, chills, fatigue, lassitude. HEENT:   No-  headaches, difficulty swallowing, tooth/dental problems, sore throat,  No-  sneezing, itching, ear ache, +nasal congestion, post nasal drip,  CV:  No-   chest pain, orthopnea, PND, swelling in lower extremities, anasarca,  dizziness, palpitations Resp:  No hemoptysis  Skin: No-   rash or lesions. GI:  No-   heartburn, indigestion, abdominal pain, nausea, vomiting,  GU: . MS:  No-   joint pain or swelling.  . Neuro-     nothing unusual Psych:  No-  change in mood or affect. No depression or anxiety.  No memory loss.  OBJ- Physical Exam GEN: A/Ox3; pleasant , NAD, well nourished   HEENT:  /AT,  EACs-clear, TMs-wnl, NOSE-clear, THROAT-clear, no lesions, no postnasal drip or exudate noted.   NECK:  Supple w/ fair ROM; no JVD; normal carotid impulses w/o bruits; no thyromegaly or nodules palpated; no lymphadenopathy.  RESP CTA , no wheezing no accessory muscle use, no dullness to percussion  CARD:  RRR, no m/r/g  , no peripheral edema, pulses intact, no cyanosis or clubbing.  GI:   Soft & nt; nml bowel sounds; no organomegaly or masses detected.  Musco: Warm bil, no deformities or joint swelling noted.   Neuro: alert, no focal deficits noted.    Skin: Warm, no lesions or rashes

## 2015-01-08 NOTE — Assessment & Plan Note (Signed)
Resolved as of chest x-ray 09/30/2014

## 2015-01-08 NOTE — Assessment & Plan Note (Signed)
Good compliance and control with pressure 9. Comfort discussed. Suggested OTC Slow Mag for leg cramps at night

## 2015-01-18 ENCOUNTER — Other Ambulatory Visit: Payer: Self-pay | Admitting: Internal Medicine

## 2015-03-19 ENCOUNTER — Other Ambulatory Visit: Payer: Self-pay | Admitting: Internal Medicine

## 2015-04-02 ENCOUNTER — Other Ambulatory Visit: Payer: Self-pay | Admitting: Internal Medicine

## 2015-04-23 ENCOUNTER — Other Ambulatory Visit: Payer: Self-pay | Admitting: Internal Medicine

## 2015-05-11 ENCOUNTER — Other Ambulatory Visit: Payer: Self-pay | Admitting: Internal Medicine

## 2015-06-16 ENCOUNTER — Other Ambulatory Visit (INDEPENDENT_AMBULATORY_CARE_PROVIDER_SITE_OTHER): Payer: BLUE CROSS/BLUE SHIELD

## 2015-06-16 DIAGNOSIS — Z0000 Encounter for general adult medical examination without abnormal findings: Secondary | ICD-10-CM | POA: Diagnosis not present

## 2015-06-16 LAB — TSH: TSH: 2.95 u[IU]/mL (ref 0.35–4.50)

## 2015-06-16 LAB — CBC WITH DIFFERENTIAL/PLATELET
BASOS ABS: 0 10*3/uL (ref 0.0–0.1)
Basophils Relative: 0.5 % (ref 0.0–3.0)
EOS ABS: 0.3 10*3/uL (ref 0.0–0.7)
Eosinophils Relative: 2.9 % (ref 0.0–5.0)
HEMATOCRIT: 43.8 % (ref 36.0–46.0)
Hemoglobin: 14.6 g/dL (ref 12.0–15.0)
LYMPHS PCT: 29.4 % (ref 12.0–46.0)
Lymphs Abs: 2.8 10*3/uL (ref 0.7–4.0)
MCHC: 33.3 g/dL (ref 30.0–36.0)
MCV: 87.6 fl (ref 78.0–100.0)
MONOS PCT: 7.4 % (ref 3.0–12.0)
Monocytes Absolute: 0.7 10*3/uL (ref 0.1–1.0)
NEUTROS ABS: 5.6 10*3/uL (ref 1.4–7.7)
Neutrophils Relative %: 59.8 % (ref 43.0–77.0)
PLATELETS: 272 10*3/uL (ref 150.0–400.0)
RBC: 5 Mil/uL (ref 3.87–5.11)
RDW: 14.1 % (ref 11.5–15.5)
WBC: 9.4 10*3/uL (ref 4.0–10.5)

## 2015-06-16 LAB — LIPID PANEL
CHOL/HDL RATIO: 4
Cholesterol: 168 mg/dL (ref 0–200)
HDL: 42 mg/dL (ref >39.00)
LDL Cholesterol: 92 mg/dL (ref 0–99)
NONHDL: 125.88
Triglycerides: 168 mg/dL — ABNORMAL HIGH (ref 0.0–149.0)
VLDL: 33.6 mg/dL (ref 0.0–40.0)

## 2015-06-16 LAB — BASIC METABOLIC PANEL
BUN: 10 mg/dL (ref 6–23)
CALCIUM: 9.7 mg/dL (ref 8.4–10.5)
CO2: 29 mEq/L (ref 19–32)
CREATININE: 0.82 mg/dL (ref 0.40–1.20)
Chloride: 103 mEq/L (ref 96–112)
GFR: 76.52 mL/min (ref >60.00)
Glucose, Bld: 120 mg/dL — ABNORMAL HIGH (ref 70–99)
Potassium: 4.3 mEq/L (ref 3.5–5.1)
SODIUM: 143 meq/L (ref 135–145)

## 2015-06-16 LAB — HEPATIC FUNCTION PANEL
ALBUMIN: 4.2 g/dL (ref 3.5–5.2)
ALK PHOS: 73 U/L (ref 39–117)
ALT: 21 U/L (ref 0–35)
AST: 16 U/L (ref 0–37)
BILIRUBIN DIRECT: 0.1 mg/dL (ref 0.0–0.3)
TOTAL PROTEIN: 6.9 g/dL (ref 6.0–8.3)
Total Bilirubin: 0.5 mg/dL (ref 0.2–1.2)

## 2015-06-16 LAB — MICROALBUMIN / CREATININE URINE RATIO
Creatinine,U: 155.5 mg/dL
MICROALB/CREAT RATIO: 0.5 mg/g (ref 0.0–30.0)
Microalb, Ur: 0.7 mg/dL (ref 0.0–1.9)

## 2015-06-16 LAB — HEMOGLOBIN A1C: HEMOGLOBIN A1C: 6.8 % — AB (ref 4.6–6.5)

## 2015-06-20 ENCOUNTER — Other Ambulatory Visit: Payer: Self-pay | Admitting: Internal Medicine

## 2015-06-23 ENCOUNTER — Encounter: Payer: BLUE CROSS/BLUE SHIELD | Admitting: Internal Medicine

## 2015-06-24 ENCOUNTER — Encounter: Payer: Self-pay | Admitting: Internal Medicine

## 2015-06-24 ENCOUNTER — Ambulatory Visit (INDEPENDENT_AMBULATORY_CARE_PROVIDER_SITE_OTHER): Payer: BLUE CROSS/BLUE SHIELD | Admitting: Internal Medicine

## 2015-06-24 VITALS — BP 144/92 | HR 79 | Temp 98.6°F | Ht 63.25 in | Wt 235.0 lb

## 2015-06-24 DIAGNOSIS — I10 Essential (primary) hypertension: Secondary | ICD-10-CM

## 2015-06-24 DIAGNOSIS — Z23 Encounter for immunization: Secondary | ICD-10-CM

## 2015-06-24 DIAGNOSIS — Z0000 Encounter for general adult medical examination without abnormal findings: Secondary | ICD-10-CM | POA: Diagnosis not present

## 2015-06-24 DIAGNOSIS — E7800 Pure hypercholesterolemia, unspecified: Secondary | ICD-10-CM | POA: Diagnosis not present

## 2015-06-24 DIAGNOSIS — G4733 Obstructive sleep apnea (adult) (pediatric): Secondary | ICD-10-CM | POA: Diagnosis not present

## 2015-06-24 NOTE — Progress Notes (Signed)
Pre visit review using our clinic review tool, if applicable. No additional management support is needed unless otherwise documented below in the visit note. 

## 2015-06-24 NOTE — Progress Notes (Signed)
Subjective:    Patient ID: Kayla [name], female    DOB: Jul 20, 1961, 54 y.o.   MRN: [id number]  HPI    Subjective:    Patient ID: Kayla [name], female    DOB: 07/03/1961, 54 y.o.   MRN: [id number]  HPI  Lab Results  Component Value Date   HGBA1C 6.8* 06/16/2015    Wt Readings from Last 3 Encounters:  06/24/15 235 lb (106.595 kg)  01/03/15 229 lb 9.6 oz (104.146 kg)  11/19/14 234 lb (106.142 kg)    BP Readings from Last 3 Encounters:  06/24/15 144/92  01/03/15 126/70  11/19/14 78/83   89 -year-old patient who is seen today for annual preventive exam.   She has type 2 diabetes.  She has done quite well and has enjoyed nice glycemic control.  Last hemoglobin A1c 6 point 8.  She has hypertension, which also has been stable.  She is on statin therapy 4.  Dyslipidemia.  Doing quite well.  No complaints.  There's been some modest weight gain since her last visit. No recent eye exam Colonoscopy 2015 Followed by allergy/pulmonary medicine   Past Medical History  Diagnosis Date  . Depressive disorder, not elsewhere classified   . Mitral valve prolapse   . Factor II deficiency ([place])     "prothrombin gene factor II" (06/05/2012); states has had no problems  . PTSD (post-traumatic stress disorder)   . Headache(784.0)     due to allergies  . Overactive bladder   . Complication of anesthesia     hard to wake up after surg. 06/04/2012 and CO2 went up to 78  . Hyperlipidemia   . Carpal tunnel syndrome of right wrist 10/2012  . Exertional dyspnea     occasionally  . OSA on CPAP   . Type II diabetes mellitus ([facility])     NIDDM  . Heart murmur     states no problems  . Osteoarthritis     knees    Social History   Social History  . Marital Status: Married    Spouse Name: N/A  . Number of Children: 2  . Years of Education: N/A   Occupational History  . Curator    Social History Main Topics  . Smoking status: Never Smoker   . Smokeless tobacco:  Never Used  . Alcohol Use: No  . Drug Use: No  . Sexual Activity: Yes   Other Topics Concern  . Not on file   Social History Narrative    Past Surgical History  Procedure Laterality Date  . Total abdominal hysterectomy  08/27/2001  . Lasik      bilateral  . Knee arthroscopy w/ meniscectomy Left 08/16/2011    partial medial  . Total knee arthroplasty  02/06/2012    Procedure: TOTAL KNEE ARTHROPLASTY;  Surgeon: Ninetta Lights, MD;  Location: [place];  Service: Orthopedics;  Laterality: Right;  total knee right side  . Knee arthroscopy Right 04/01/2008  . Shoulder arthroscopy w/ rotator cuff repair Left 2004  . Vaginal hysterectomy  1980's  . Total knee arthroplasty  06/04/2012    Procedure: TOTAL KNEE ARTHROPLASTY;  Surgeon: Ninetta Lights, MD;  Location: [place];  Service: Orthopedics;  Laterality: Left;  . Pubovaginal sling  08/27/2001    cysto; suprapubic tube placement  . Breast biopsy Left 04/25/2001  . Carpal tunnel release Right 10/24/2012    Procedure: CARPAL TUNNEL RELEASE;  Surgeon: Ninetta Lights, MD;  Location: [facility];  Service: Orthopedics;  Laterality: Right;  RIGHT CARPAL TUNNEL RELEASE    Family History  Problem Relation Age of Onset  . Heart disease Father   . COPD Mother   . Heart disease Mother   . Heart attack Brother   . Colon cancer Neg Hx     Allergies  Allergen Reactions  . Morphine And Related Shortness Of Breath and Itching      BP 144/92 mmHg  Pulse 79  Temp(Src) 98.6 F (37 C) (Oral)  Ht 5' 3.25" (1.607 m)  Wt 235 lb (106.595 kg)  BMI 41.28 kg/m2  SpO2 96%        Family History  Problem Relation Age of Onset  . Heart disease Father   . COPD Mother   . Heart disease Mother   . Heart attack Brother   . Colon cancer Neg Hx     Allergies  Allergen Reactions  . Morphine And Related Shortness Of Breath and Itching      BP 144/92 mmHg  Pulse 79  Temp(Src) 98.6 F (37 C) (Oral)  Ht 5' 3.25" (1.607 m)  Wt  235 lb (106.595 kg)  BMI 41.28 kg/m2  SpO2 96%     Review of Systems  Constitutional: Negative.   HENT: Negative for congestion, dental problem, hearing loss, rhinorrhea, sinus pressure, sore throat and tinnitus.   Eyes: Negative for pain, discharge and visual disturbance.  Respiratory: Negative for cough and shortness of breath.   Cardiovascular: Negative for chest pain, palpitations and leg swelling.  Gastrointestinal: Negative for nausea, vomiting, abdominal pain, diarrhea, constipation, blood in stool and abdominal distention.  Genitourinary: Negative for dysuria, urgency, frequency, hematuria, flank pain, vaginal bleeding, vaginal discharge, difficulty urinating, vaginal pain and pelvic pain.  Musculoskeletal: Negative for arthralgias, gait problem and joint swelling.  Skin: Negative for rash.  Neurological: Negative for dizziness, syncope, speech difficulty, weakness, numbness and headaches.  Hematological: Negative for adenopathy.  Psychiatric/Behavioral: Negative for behavioral problems, dysphoric mood and agitation. The patient is not nervous/anxious.        Objective:   Physical Exam  Constitutional: She is oriented to person, place, and time. She appears well-developed and well-nourished.  HENT:  Head: Normocephalic.  Right Ear: External ear normal.  Left Ear: External ear normal.  Mouth/Throat: Oropharynx is clear and moist.  Fundi normal  Eyes: Conjunctivae and EOM are normal. Pupils are equal, round, and reactive to light.  Neck: Normal range of motion. Neck supple. No thyromegaly present.  Cardiovascular: Normal rate, regular rhythm, normal heart sounds and intact distal pulses.   Pulmonary/Chest: Effort normal and breath sounds normal.  Abdominal: Soft. Bowel sounds are normal. She exhibits no mass. There is no tenderness.  Musculoskeletal: Normal range of motion.  Lymphadenopathy:    She has no cervical adenopathy.  Neurological: She is alert and oriented to  person, place, and time.  Skin: Skin is warm and dry. No rash noted.  Psychiatric: She has a normal mood and affect. Her behavior is normal.          Assessment & Plan:  Diabetes mellitus.  Well controlled.  We'll check a hemoglobin A1c Hypertension well controlled Obesity improved Dyslipidemia.  Continue statin therapy  Recheck 3 months   Review of Systems As above    Objective:   Physical Exam  As above      Assessment & Plan:   Preventive health exam Hypertension, stable Diabetes, reasonable control Morbid obesity with OSA Allergic rhinitis Dyslipidemia.  Continue  statin therapy  Recheck 4-6 months Yearly eye  examination encouraged

## 2015-06-24 NOTE — Patient Instructions (Addendum)
Limit your sodium (Salt) intake   Please check your hemoglobin A1c every 3-6  Months  You need to lose weight.  Consider a lower calorie diet and regular exercise.   Return in 6 months for follow-up  Health Maintenance, Female Adopting a healthy lifestyle and getting preventive care can go a long way to promote health and wellness. Talk with your health care provider about what schedule of regular examinations is right for you. This is a good chance for you to check in with your provider about disease prevention and staying healthy. In between checkups, there are plenty of things you can do on your own. Experts have done a lot of research about which lifestyle changes and preventive measures are most likely to keep you healthy. Ask your health care provider for more information. WEIGHT AND DIET  Eat a healthy diet  Be sure to include plenty of vegetables, fruits, low-fat dairy products, and lean protein.  Do not eat a lot of foods high in solid fats, added sugars, or salt.  Get regular exercise. This is one of the most important things you can do for your health.  Most adults should exercise for at least 150 minutes each week. The exercise should increase your heart rate and make you sweat (moderate-intensity exercise).  Most adults should also do strengthening exercises at least twice a week. This is in addition to the moderate-intensity exercise.  Maintain a healthy weight  Body mass index (BMI) is a measurement that can be used to identify possible weight problems. It estimates body fat based on height and weight. Your health care provider can help determine your BMI and help you achieve or maintain a healthy weight.  For females 30 years of age and older:   A BMI below 18.5 is considered underweight.  A BMI of 18.5 to 24.9 is normal.  A BMI of 25 to 29.9 is considered overweight.  A BMI of 30 and above is considered obese.  Watch levels of cholesterol and blood  lipids  You should start having your blood tested for lipids and cholesterol at 56 years of age, then have this test every 5 years.  You may need to have your cholesterol levels checked more often if:  Your lipid or cholesterol levels are high.  You are older than 56 years of age.  You are at high risk for heart disease.  CANCER SCREENING   Lung Cancer  Lung cancer screening is recommended for adults 1-21 years old who are at high risk for lung cancer because of a history of smoking.  A yearly low-dose CT scan of the lungs is recommended for people who:  Currently smoke.  Have quit within the past 15 years.  Have at least a 30-pack-year history of smoking. A pack year is smoking an average of one pack of cigarettes a day for 1 year.  Yearly screening should continue until it has been 15 years since you quit.  Yearly screening should stop if you develop a health problem that would prevent you from having lung cancer treatment.  Breast Cancer  Practice breast self-awareness. This means understanding how your breasts normally appear and feel.  It also means doing regular breast self-exams. Let your health care provider know about any changes, no matter how small.  If you are in your 20s or 30s, you should have a clinical breast exam (CBE) by a health care provider every 1-3 years as part of a regular health exam.  If you  are 37 or older, have a CBE every year. Also consider having a breast X-ray (mammogram) every year.  If you have a family history of breast cancer, talk to your health care provider about genetic screening.  If you are at high risk for breast cancer, talk to your health care provider about having an MRI and a mammogram every year.  Breast cancer gene (BRCA) assessment is recommended for women who have family members with BRCA-related cancers. BRCA-related cancers include:  Breast.  Ovarian.  Tubal.  Peritoneal cancers.  Results of the assessment  will determine the need for genetic counseling and BRCA1 and BRCA2 testing. Cervical Cancer Your health care provider may recommend that you be screened regularly for cancer of the pelvic organs (ovaries, uterus, and vagina). This screening involves a pelvic examination, including checking for microscopic changes to the surface of your cervix (Pap test). You may be encouraged to have this screening done every 3 years, beginning at age 7.  For women ages 41-65, health care providers may recommend pelvic exams and Pap testing every 3 years, or they may recommend the Pap and pelvic exam, combined with testing for human papilloma virus (HPV), every 5 years. Some types of HPV increase your risk of cervical cancer. Testing for HPV may also be done on women of any age with unclear Pap test results.  Other health care providers may not recommend any screening for nonpregnant women who are considered low risk for pelvic cancer and who do not have symptoms. Ask your health care provider if a screening pelvic exam is right for you.  If you have had past treatment for cervical cancer or a condition that could lead to cancer, you need Pap tests and screening for cancer for at least 20 years after your treatment. If Pap tests have been discontinued, your risk factors (such as having a new sexual partner) need to be reassessed to determine if screening should resume. Some women have medical problems that increase the chance of getting cervical cancer. In these cases, your health care provider may recommend more frequent screening and Pap tests. Colorectal Cancer  This type of cancer can be detected and often prevented.  Routine colorectal cancer screening usually begins at 56 years of age and continues through 56 years of age.  Your health care provider may recommend screening at an earlier age if you have risk factors for colon cancer.  Your health care provider may also recommend using home test kits to check  for hidden blood in the stool.  A small camera at the end of a tube can be used to examine your colon directly (sigmoidoscopy or colonoscopy). This is done to check for the earliest forms of colorectal cancer.  Routine screening usually begins at age 63.  Direct examination of the colon should be repeated every 5-10 years through 56 years of age. However, you may need to be screened more often if early forms of precancerous polyps or small growths are found. Skin Cancer  Check your skin from head to toe regularly.  Tell your health care provider about any new moles or changes in moles, especially if there is a change in a mole's shape or color.  Also tell your health care provider if you have a mole that is larger than the size of a pencil eraser.  Always use sunscreen. Apply sunscreen liberally and repeatedly throughout the day.  Protect yourself by wearing long sleeves, pants, a wide-brimmed hat, and sunglasses whenever you are outside.  HEART DISEASE, DIABETES, AND HIGH BLOOD PRESSURE   High blood pressure causes heart disease and increases the risk of stroke. High blood pressure is more likely to develop in:  People who have blood pressure in the high end of the normal range (130-139/85-89 mm Hg).  People who are overweight or obese.  People who are African American.  If you are 61-50 years of age, have your blood pressure checked every 3-5 years. If you are 35 years of age or older, have your blood pressure checked every year. You should have your blood pressure measured twice--once when you are at a hospital or clinic, and once when you are not at a hospital or clinic. Record the average of the two measurements. To check your blood pressure when you are not at a hospital or clinic, you can use:  An automated blood pressure machine at a pharmacy.  A home blood pressure monitor.  If you are between 71 years and 43 years old, ask your health care provider if you should take  aspirin to prevent strokes.  Have regular diabetes screenings. This involves taking a blood sample to check your fasting blood sugar level.  If you are at a normal weight and have a low risk for diabetes, have this test once every three years after 56 years of age.  If you are overweight and have a high risk for diabetes, consider being tested at a younger age or more often. PREVENTING INFECTION  Hepatitis B  If you have a higher risk for hepatitis B, you should be screened for this virus. You are considered at high risk for hepatitis B if:  You were born in a country where hepatitis B is common. Ask your health care provider which countries are considered high risk.  Your parents were born in a high-risk country, and you have not been immunized against hepatitis B (hepatitis B vaccine).  You have HIV or AIDS.  You use needles to inject street drugs.  You live with someone who has hepatitis B.  You have had sex with someone who has hepatitis B.  You get hemodialysis treatment.  You take certain medicines for conditions, including cancer, organ transplantation, and autoimmune conditions. Hepatitis C  Blood testing is recommended for:  Everyone born from [birth year] through 1967.  Anyone with known risk factors for hepatitis C. Sexually transmitted infections (STIs)  You should be screened for sexually transmitted infections (STIs) including gonorrhea and chlamydia if:  You are sexually active and are younger than 56 years of age.  You are older than 56 years of age and your health care provider tells you that you are at risk for this type of infection.  Your sexual activity has changed since you were last screened and you are at an increased risk for chlamydia or gonorrhea. Ask your health care provider if you are at risk.  If you do not have HIV, but are at risk, it may be recommended that you take a prescription medicine daily to prevent HIV infection. This is called  pre-exposure prophylaxis (PrEP). You are considered at risk if:  You are sexually active and do not regularly use condoms or know the HIV status of your partner(s).  You take drugs by injection.  You are sexually active with a partner who has HIV. Talk with your health care provider about whether you are at high risk of being infected with HIV. If you choose to begin PrEP, you should first be tested for HIV. You  should then be tested every 3 months for as long as you are taking PrEP.  PREGNANCY   If you are premenopausal and you may become pregnant, ask your health care provider about preconception counseling.  If you may become pregnant, take 400 to 800 micrograms (mcg) of folic acid every day.  If you want to prevent pregnancy, talk to your health care provider about birth control (contraception). OSTEOPOROSIS AND MENOPAUSE   Osteoporosis is a disease in which the bones lose minerals and strength with aging. This can result in serious bone fractures. Your risk for osteoporosis can be identified using a bone density scan.  If you are 61 years of age or older, or if you are at risk for osteoporosis and fractures, ask your health care provider if you should be screened.  Ask your health care provider whether you should take a calcium or vitamin D supplement to lower your risk for osteoporosis.  Menopause may have certain physical symptoms and risks.  Hormone replacement therapy may reduce some of these symptoms and risks. Talk to your health care provider about whether hormone replacement therapy is right for you.  HOME CARE INSTRUCTIONS   Schedule regular health, dental, and eye exams.  Stay current with your immunizations.   Do not use any tobacco products including cigarettes, chewing tobacco, or electronic cigarettes.  If you are pregnant, do not drink alcohol.  If you are breastfeeding, limit how much and how often you drink alcohol.  Limit alcohol intake to no more than 1  drink per day for nonpregnant women. One drink equals 12 ounces of beer, 5 ounces of wine, or 1 ounces of hard liquor.  Do not use street drugs.  Do not share needles.  Ask your health care provider for help if you need support or information about quitting drugs.  Tell your health care provider if you often feel depressed.  Tell your health care provider if you have ever been abused or do not feel safe at home.   This information is not intended to replace advice given to you by your health care provider. Make sure you discuss any questions you have with your health care provider.   Document Released: 03/12/2011 Document Revised: 09/17/2014 Document Reviewed: 07/29/2013 Elsevier Interactive Patient Education 2016 [employer].   Please see your eye doctor yearly to check for diabetic eye damage

## 2015-07-14 ENCOUNTER — Other Ambulatory Visit: Payer: Self-pay | Admitting: Internal Medicine

## 2015-07-28 DIAGNOSIS — R6882 Decreased libido: Secondary | ICD-10-CM | POA: Diagnosis not present

## 2015-07-29 ENCOUNTER — Telehealth: Payer: Self-pay | Admitting: Internal Medicine

## 2015-07-29 MED ORDER — PREDNISONE 10 MG PO TABS: ORAL_TABLET | ORAL | Status: DC

## 2015-07-29 NOTE — Telephone Encounter (Signed)
Spoke with pt - c/o cough, increased SOB, wheezing with expiration and some congestion x 1-2 weeks.  Pt requests an appt today with CY, advised no appts available but will see what rec's can be offered - please advise. Thanks.   Allergies  Allergen Reactions  . Morphine And Related Shortness Of Breath and Itching     Medication List       This list is accurate as of: 07/29/15 11:06 AM.  Always use your most recent med list.               atorvastatin 20 MG tablet  Commonly known as:  LIPITOR  TAKE ONE TABLET BY MOUTH ONCE DAILY     bacitracin-polymyxin b ophthalmic ointment  Commonly known as:  POLYSPORIN  Place 1 application into the left eye 4 (four) times daily.     estradiol 0.5 MG tablet  Commonly known as:  ESTRACE  Take 1 mg by mouth daily.     IMODIUM PO  Take 2 tablets by mouth 2 (two) times daily.     LORazepam 0.5 MG tablet  Commonly known as:  ATIVAN  TAKE ONE TABLET BY MOUTH EVERY 8 HOURS AS NEEDED FOR ANXIETY     meloxicam 15 MG tablet  Commonly known as:  MOBIC  Take 1 tablet (15 mg total) by mouth daily.     metFORMIN 500 MG 24 hr tablet  Commonly known as:  GLUCOPHAGE-XR  TAKE TWO TABLETS BY MOUTH ONCE DAILY WITH BREAKFAST     methocarbamol 500 MG tablet  Commonly known as:  ROBAXIN  Take 1-2 tablets (500-1,000 mg total) by mouth every 6 (six) hours as needed (spasms).     oxybutynin 15 MG 24 hr tablet  Commonly known as:  DITROPAN XL  TAKE ONE TABLET BY MOUTH ONCE DAILY **NEEDS  PHYSICAL,  NO  FURTHER  REFILLS  TILL  SEEN**     oxyCODONE-acetaminophen 5-325 MG tablet  Commonly known as:  PERCOCET  Take 1-2 tablets by mouth every 4 (four) hours as needed for pain.     PEPTO-BISMOL TO-GO 262 MG chewable tablet  Generic drug:  bismuth subsalicylate  Chew [id number] mg by mouth 2 (two) times daily.     sertraline 100 MG tablet  Commonly known as:  ZOLOFT  TAKE ONE TABLET BY MOUTH ONCE DAILY **NEEDS  PHYSICAL,  NO  FURTHER  REFILLS  TILL  SEEN**      testosterone cypionate 100 MG/ML injection  Commonly known as:  DEPOTESTOTERONE CYPIONATE  Inject 100 mg into the muscle every 28 (twenty-eight) days. For IM use only

## 2015-07-29 NOTE — Telephone Encounter (Signed)
Prednisone 10 mg, # 20, 4 X 2 DAYS, 3 X 2 DAYS, 2 X 2 DAYS, 1 X 2 DAYS  

## 2015-07-29 NOTE — Telephone Encounter (Signed)
Patient notified of Dr. Janee Morn recommendations. Rx sent to pharmacy. Nothing further needed. Closing encounter

## 2015-08-13 DIAGNOSIS — H65192 Other acute nonsuppurative otitis media, left ear: Secondary | ICD-10-CM | POA: Diagnosis not present

## 2015-08-13 DIAGNOSIS — R05 Cough: Secondary | ICD-10-CM | POA: Diagnosis not present

## 2015-08-13 DIAGNOSIS — J209 Acute bronchitis, unspecified: Secondary | ICD-10-CM | POA: Diagnosis not present

## 2015-08-13 DIAGNOSIS — R000 Tachycardia, unspecified: Secondary | ICD-10-CM | POA: Diagnosis not present

## 2015-08-13 DIAGNOSIS — J019 Acute sinusitis, unspecified: Secondary | ICD-10-CM | POA: Diagnosis not present

## 2015-08-18 ENCOUNTER — Other Ambulatory Visit: Payer: Self-pay | Admitting: Internal Medicine

## 2015-09-26 DIAGNOSIS — R6882 Decreased libido: Secondary | ICD-10-CM | POA: Diagnosis not present

## 2015-09-26 DIAGNOSIS — N959 Unspecified menopausal and perimenopausal disorder: Secondary | ICD-10-CM | POA: Diagnosis not present

## 2015-09-26 DIAGNOSIS — Z1231 Encounter for screening mammogram for malignant neoplasm of breast: Secondary | ICD-10-CM | POA: Diagnosis not present

## 2015-09-26 DIAGNOSIS — Z6841 Body Mass Index (BMI) 40.0 and over, adult: Secondary | ICD-10-CM | POA: Diagnosis not present

## 2015-10-10 DIAGNOSIS — M461 Sacroiliitis, not elsewhere classified: Secondary | ICD-10-CM | POA: Diagnosis not present

## 2015-11-26 ENCOUNTER — Other Ambulatory Visit: Payer: Self-pay | Admitting: Internal Medicine

## 2015-12-08 ENCOUNTER — Telehealth: Payer: Self-pay | Admitting: Internal Medicine

## 2015-12-08 MED ORDER — PREDNISONE 10 MG PO TABS: 10.0000 mg | ORAL_TABLET | Freq: Every day | ORAL | Status: DC

## 2015-12-08 NOTE — Telephone Encounter (Signed)
Called spoke with patient and conveyed CY's condolences - pt stated that she does have family support Discussed rx and otc recommendations.  Pt voiced her understanding and denied any questions/concerns.  Pt is aware to contact the office if her symptoms do not improve or they worsen.  Rx sent to verified pharmacy Nothing further needed; will sign off.

## 2015-12-08 NOTE — Telephone Encounter (Signed)
Called spoke with patient who reports dry cough, tightness in chest and wheezing x2-3 days.  Denies any purulent sputum, f/c/s, hemoptysis.  Pt does state that she witnessed her uncle commit suicide yesterday and feels that the stress of the situation has caused her dyspnea to worsen x1 day.  Dr Annamaria Boots please advise, thank you. Last ov 4.25.16, follow up in 1 year > appt scheduled for 4.27.17 [facility] in [place]  Allergies  Allergen Reactions  . Morphine And Related Shortness Of Breath and Itching   Current Outpatient Prescriptions on File Prior to Visit  Medication Sig Dispense Refill  . atorvastatin (LIPITOR) 20 MG tablet TAKE ONE TABLET BY MOUTH ONCE DAILY 90 tablet 1  . bacitracin-polymyxin b (POLYSPORIN) ophthalmic ointment Place 1 application into the left eye 4 (four) times daily. 3.5 g 0  . bismuth subsalicylate (PEPTO-BISMOL TO-GO) 262 MG chewable tablet Chew 524 mg by mouth 2 (two) times daily.    Marland Kitchen estradiol (ESTRACE) 0.5 MG tablet Take 1 mg by mouth daily.     . Loperamide HCl (IMODIUM PO) Take 2 tablets by mouth 2 (two) times daily.    Marland Kitchen LORazepam (ATIVAN) 0.5 MG tablet TAKE ONE TABLET BY MOUTH EVERY 8 HOURS AS NEEDED FOR ANXIETY 60 tablet 2  . meloxicam (MOBIC) 15 MG tablet Take 1 tablet (15 mg total) by mouth daily. 90 tablet 1  . metFORMIN (GLUCOPHAGE-XR) 500 MG 24 hr tablet TAKE TWO TABLETS BY MOUTH ONCE DAILY WITH BREAKFAST 180 tablet 1  . methocarbamol (ROBAXIN) 500 MG tablet Take 1-2 tablets (500-1,000 mg total) by mouth every 6 (six) hours as needed (spasms). 60 tablet 0  . oxybutynin (DITROPAN XL) 15 MG 24 hr tablet Take 1 tablet (15 mg total) by mouth daily. 90 tablet 1  . oxyCODONE-acetaminophen (PERCOCET) 5-325 MG per tablet Take 1-2 tablets by mouth every 4 (four) hours as needed for pain. 30 tablet 0  . predniSONE (DELTASONE) 10 MG tablet Take 40mg  x 2 days, 30mg  x 2 days, 20mg  x 2 days, 10mg  x 2 days, then STOP 20 tablet 0  . sertraline (ZOLOFT) 100 MG tablet Take  1 tablet (100 mg total) by mouth daily. 90 tablet 1  . testosterone cypionate (DEPOTESTOTERONE CYPIONATE) 100 MG/ML injection Inject 100 mg into the muscle every 28 (twenty-eight) days. For IM use only     No current facility-administered medications on file prior to visit.

## 2015-12-08 NOTE — Telephone Encounter (Signed)
I'm sorry to hear about her uncle. I hope she has family support.  Offer prednisone 10 mg, # 5, 1 daily  Mucinex-DM or Delsym cough syrup should help with the cough

## 2016-01-05 ENCOUNTER — Ambulatory Visit (INDEPENDENT_AMBULATORY_CARE_PROVIDER_SITE_OTHER): Payer: BLUE CROSS/BLUE SHIELD | Admitting: Internal Medicine

## 2016-01-05 ENCOUNTER — Encounter: Payer: Self-pay | Admitting: Internal Medicine

## 2016-01-05 VITALS — BP 122/82 | HR 78 | Ht 63.0 in | Wt 230.8 lb

## 2016-01-05 DIAGNOSIS — G4733 Obstructive sleep apnea (adult) (pediatric): Secondary | ICD-10-CM | POA: Diagnosis not present

## 2016-01-05 DIAGNOSIS — E669 Obesity, unspecified: Secondary | ICD-10-CM

## 2016-01-05 NOTE — Patient Instructions (Signed)
Order- [facility]ME [facility]- please download CPAP for pressure compliance      Dx OSA  Please call if we can help

## 2016-01-05 NOTE — Progress Notes (Signed)
Subjective:    Patient ID: Kayla [name], female    DOB: September 24, 1960, 55 y.o.   MRN: [id number]  HPI 01/07/11- 48 yoF self referred for sleep medicine evaluation. She describes loud snoring, witnessed respiratory distress and daytme tiredness. Occasional ambien at bedtime. Occasional caffeine in day. No ENT surgery. Has considered sleep an issue for 10 years. See sleep questionnaire.  02/15/11- OSA / Insomnia Returns after sleep study  NPSG 01/30/11- Moderate OSA  AHI 19.9/ hr. CPAP 9 gave AHI 0/hr Zolpidem takes too long now to help her get to sleep and doesn't last through the night.  03/29/11-  77 yoF never smoker followed for OSA / Insomnia, complicated by HBP, depression Doing well, using CPAP all night every night. It makes a difference. Download for compliance is pending. Mask and pressure seem fine.  Zolpidem didn't work well for her difficulty initiating and maintaining sleep. Lunesta didn't help at all. We discussed a trial of clonazepam.   05/20/12- 51 yoF never smoker followed for OSA / Insomnia, complicated by HBP, depression Wears CPAP 9/[facility] every night for about 6 hours, nasal pillows mask. Difficulty initiating and maintaining sleep. Sleep hygiene reviewed. Bedtime 8:30 or 9 PM when she lies down with her granddaughter, then tries to sleep until 7 AM is probably longer than her needed sleep time. Failed zolpidem, Lunesta, clonazepam, none of which seemed to help provide comfortable sleep for that long.  01/28/14- 70 yoF never smoker followed for OSA / Insomnia, complicated by HBP, depression FOLLOWS FOR: Last seen 05-2012; Wears CPAP 9/ [facility] every night for about 6-8 hours.  Using melatonin and diphenhydramine to help sleep. Co-sleeps with granddaughter. New problem-concerned about periorbital puffiness for the past month without burning or itching. Mild nasal congestion.  09/23/14 Acute OV (OSA on CPAP , never smoker )  Complains of 2 weeks of productive cough,  congestion, wheezing, fever (101) and drainage . Has been using several otc cold meds with minimal help.  Not able to wear CPAP due to congestion and cough.  Cough is keeping her up at night.  Granddaughter is sick with cold.  No recent travel or abx use.  She denies any chest pain, orthopnea, PND, leg swelling, hemoptysis, or nausea, vomiting, diarrhea. >>Zpack   09/30/2014 Follow up -PNA  Returns for 1 week follow up PNA.  Was seen last week , CXR showed RLL PNA .  Tx w/ Zpack . Says she is feeling much better.  Reports breathing is 75% improved since last ov.   Does still have cough, and occasional dyspnea and tightness. No chest pain, orthopnea, edema or hemoptysis .  Appetite is good. No n/v/d.  Trying to restart CPAP .   01/03/15-  54 yoF never smoker followed for OSA / Insomnia, complicated by HBP, depression, DM FOLLOWS FOR: Pt states she went back to using her CPAP 9/ [facility] machine-wears most every night (99%). DME is [facility]-will need to place order for download as patient has not had machine read through [facility] in a long time. Pt states she feels better other than being tired from the PNA back in 09-2014. Pt having leg cramps that wake her in the middle of the night. CXR 09/30/14 IMPRESSION: Interim near complete clearing right lower lobe infiltrate. Electronically Signed  By: Marcello Moores Register  On: 09/30/2014 09:10  01/05/2016-55 year old female never smoker followed for OSA/Insomnia, complicated by HBP, depression, DM FOLLOWS FOR: DME [facility]. Pt states she wears CPAP every night for 6-8 hours; pressure working well and  no DL at this time-will need to place order.  CPAP 9/[facility]  ROS-see HPI Constitutional:   No-   weight loss, night sweats, fevers, chills, fatigue, lassitude. HEENT:   No-  headaches, difficulty swallowing, tooth/dental problems, sore throat,       No-  sneezing, itching, ear ache, +nasal congestion, post nasal drip,  CV:  No-   chest pain, orthopnea, PND,  swelling in lower extremities, anasarca,  dizziness, palpitations Resp:  No hemoptysis  Skin: No-   rash or lesions. GI:  No-   heartburn, indigestion, abdominal pain, nausea, vomiting,  GU: . MS:  No-   joint pain or swelling.  . Neuro-     nothing unusual Psych:  No- change in mood or affect. No depression or anxiety.  No memory loss.  OBJ- Physical Exam General- Alert, Oriented, Affect-appropriate, Distress- none acute, + overweight Skin- rash-none, lesions- none, excoriation- none Lymphadenopathy- none Head- atraumatic            Eyes- Gross vision intact, PERRLA, conjunctivae and secretions clear            Ears- Hearing, canals-normal            Nose- Clear, no-Septal dev, mucus, polyps, erosion, perforation             Throat- Mallampati III , mucosa clear , drainage- none, tonsils- atrophic Neck- flexible , trachea midline, no stridor , thyroid nl, carotid no bruit Chest - symmetrical excursion , unlabored           Heart/CV- RRR , no murmur , no gallop  , no rub, nl s1 s2                           - JVD- none , edema- none, stasis changes- none, varices- none           Lung- clear to P&A, wheeze- none, cough- none , dullness-none, rub- none           Chest wall-  Abd-  Br/ Gen/ Rectal- Not done, not indicated Extrem- cyanosis- none, clubbing, none, atrophy- none, strength- nl Neuro- grossly intact to observation

## 2016-01-06 DIAGNOSIS — R6882 Decreased libido: Secondary | ICD-10-CM | POA: Diagnosis not present

## 2016-01-08 DIAGNOSIS — E669 Obesity, unspecified: Secondary | ICD-10-CM | POA: Insufficient documentation

## 2016-01-08 NOTE — Assessment & Plan Note (Signed)
CPAP 9 pressure seems good and she describes good compliance. Plan-request download from [facility]

## 2016-01-08 NOTE — Assessment & Plan Note (Signed)
She is encouraged to make about her weight loss effort. Successful weight loss would help sleep apnea, diabetes and several of her other medical problems as discussed.

## 2016-01-29 ENCOUNTER — Other Ambulatory Visit: Payer: Self-pay | Admitting: Internal Medicine

## 2016-01-30 NOTE — Telephone Encounter (Signed)
Lorazepam last filled on 06/22/15 (3 refills) to take 1 po q8 prn Last seen on 06/24/15 No upcoming appt scheduled

## 2016-01-30 NOTE — Telephone Encounter (Signed)
Okay for 30 days Please schedule follow-up appointment

## 2016-01-31 NOTE — Telephone Encounter (Signed)
Left a message for a return call.

## 2016-02-01 NOTE — Telephone Encounter (Signed)
Pt has mad a follow up appointment in 2 weeks, but is competely out of these meds.  They were not called in.  LORazepam (ATIVAN) 0.5 MG tablet metFORMIN (GLUCOPHAGE-XR) 500 MG 24 hr tablet  [facility] / Linna Hoff

## 2016-02-13 ENCOUNTER — Ambulatory Visit (INDEPENDENT_AMBULATORY_CARE_PROVIDER_SITE_OTHER): Payer: BLUE CROSS/BLUE SHIELD | Admitting: Internal Medicine

## 2016-02-13 ENCOUNTER — Encounter: Payer: Self-pay | Admitting: Internal Medicine

## 2016-02-13 VITALS — BP 144/90 | HR 78 | Temp 98.3°F | Resp 20 | Ht 63.0 in | Wt 232.0 lb

## 2016-02-13 DIAGNOSIS — I10 Essential (primary) hypertension: Secondary | ICD-10-CM | POA: Diagnosis not present

## 2016-02-13 DIAGNOSIS — E7800 Pure hypercholesterolemia, unspecified: Secondary | ICD-10-CM | POA: Diagnosis not present

## 2016-02-13 DIAGNOSIS — E119 Type 2 diabetes mellitus without complications: Secondary | ICD-10-CM | POA: Diagnosis not present

## 2016-02-13 LAB — HEMOGLOBIN A1C: Hgb A1c MFr Bld: 7 % — ABNORMAL HIGH (ref 4.6–6.5)

## 2016-02-13 MED ORDER — LORAZEPAM 0.5 MG PO TABS: 0.5000 mg | ORAL_TABLET | Freq: Three times a day (TID) | ORAL | Status: DC | PRN

## 2016-02-13 NOTE — Progress Notes (Signed)
Subjective:    Patient ID: Kayla [name], female    DOB: Aug 10, 1961, 55 y.o.   MRN: [id number]  HPI  Lab Results  Component Value Date   HGBA1C 6.8* 06/16/2015    BP Readings from Last 3 Encounters:  02/13/16 144/90  01/05/16 122/82  06/24/15 144/92    Wt Readings from Last 3 Encounters:  02/13/16 232 lb (105.235 kg)  01/05/16 230 lb 12.8 oz (104.69 kg)  06/24/15 235 lb (106.4 kg)   55 year old patient who is seen today for follow-up of type 2 diabetes.  She remains controlled on metformin therapy alone.  She had a eye examination last week.  She has essential hypertension, but presently managed off therapy.  She states that she has had some low blood pressure readings in the past, on treatment.  No new concerns or complaints.  She remains on statin therapy for dyslipidemia.  She has obesity.  She has occasional low back pain.  Past Medical History  Diagnosis Date  . Depressive disorder, not elsewhere classified   . Mitral valve prolapse   . 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 PTSD (post-traumatic stress disorder)   . Headache(784.0)     due to allergies  . Overactive bladder   . Complication of anesthesia     hard to wake up after surg. 06/04/2012 and CO2 went up to 78  . Hyperlipidemia   . Carpal tunnel syndrome of right wrist 10/2012  . Exertional dyspnea     occasionally  . OSA on CPAP   . Type II diabetes mellitus (HCC)     NIDDM  . Heart murmur     states no problems  . Osteoarthritis     knees     Social History   Social History  . Marital Status: Married    Spouse Name: N/A  . Number of Children: 2  . Years of Education: N/A   Occupational History  . Curator    Social History Main Topics  . Smoking status: Never Smoker   . Smokeless tobacco: Never Used  . Alcohol Use: No  . Drug Use: No  . Sexual Activity: Yes   Other Topics Concern  . Not on file    Social History Narrative    Past Surgical History  Procedure Laterality Date  . Total abdominal hysterectomy  08/27/2001  . Lasik      bilateral  . Knee arthroscopy w/ meniscectomy Left 08/16/2011    partial medial  . Total knee arthroplasty  02/06/2012    Procedure: TOTAL KNEE ARTHROPLASTY;  Surgeon: Ninetta Lights, MD;  Location: [facility];  Service: Orthopedics;  Laterality: Right;  total knee right side  . Knee arthroscopy Right 04/01/2008  . Shoulder arthroscopy w/ rotator cuff repair Left 2004  . Vaginal hysterectomy  1980's  . Total knee arthroplasty  06/04/2012    Procedure: TOTAL KNEE ARTHROPLASTY;  Surgeon: Ninetta Lights, MD;  Location: [facility];  Service: Orthopedics;  Laterality: Left;  . Pubovaginal sling  08/27/2001    cysto; suprapubic tube placement  . Breast biopsy Left 04/25/2001  . Carpal tunnel release Right 10/24/2012    Procedure: CARPAL TUNNEL RELEASE;  Surgeon: Ninetta Lights, MD;  Location: [facility];  Service: Orthopedics;  Laterality: Right;  RIGHT CARPAL TUNNEL RELEASE    Family History  Problem Relation Age of Onset  . Heart disease Father   .  COPD Mother   . Heart disease Mother   . Heart attack Brother   . Colon cancer Neg Hx     Allergies  Allergen Reactions  . Morphine And Related Shortness Of Breath and Itching    Current Outpatient Prescriptions on File Prior to Visit  Medication Sig Dispense Refill  . atorvastatin (LIPITOR) 20 MG tablet TAKE ONE TABLET BY MOUTH ONCE DAILY 90 tablet 1  . estradiol (ESTRACE) 0.5 MG tablet Take 1 mg by mouth daily.     . Loperamide HCl (IMODIUM PO) Take 2 tablets by mouth 2 (two) times daily as needed.     . meloxicam (MOBIC) 15 MG tablet Take 1 tablet (15 mg total) by mouth daily. 90 tablet 1  . metFORMIN (GLUCOPHAGE-XR) 500 MG 24 hr tablet TAKE TWO TABLETS BY MOUTH ONCE DAILY WITH BREAKFAST 180 tablet 0  . methocarbamol (ROBAXIN) 500 MG tablet Take 1-2 tablets (500-1,000 mg total) by mouth  every 6 (six) hours as needed (spasms). 60 tablet 0  . oxybutynin (DITROPAN XL) 15 MG 24 hr tablet Take 1 tablet (15 mg total) by mouth daily. 90 tablet 1  . oxyCODONE-acetaminophen (PERCOCET) 5-325 MG per tablet Take 1-2 tablets by mouth every 4 (four) hours as needed for pain. 30 tablet 0  . sertraline (ZOLOFT) 100 MG tablet Take 1 tablet (100 mg total) by mouth daily. 90 tablet 1  . testosterone cypionate (DEPOTESTOTERONE CYPIONATE) 100 MG/ML injection Inject 100 mg into the muscle every 28 (twenty-eight) days. For IM use only     No current facility-administered medications on file prior to visit.    BP 144/90 mmHg  Pulse 78  Temp(Src) 98.3 F (36.8 C) (Oral)  Resp 20  Ht 5\' 3"  (1.6 m)  Wt 232 lb (105.235 kg)  BMI 41.11 kg/m2  SpO2 98%    Review of Systems  Constitutional: Negative.   HENT: Negative for congestion, dental problem, hearing loss, rhinorrhea, sinus pressure, sore throat and tinnitus.   Eyes: Negative for pain, discharge and visual disturbance.  Respiratory: Negative for cough and shortness of breath.   Cardiovascular: Negative for chest pain, palpitations and leg swelling.  Gastrointestinal: Negative for nausea, vomiting, abdominal pain, diarrhea, constipation, blood in stool and abdominal distention.  Genitourinary: Negative for dysuria, urgency, frequency, hematuria, flank pain, vaginal bleeding, vaginal discharge, difficulty urinating, vaginal pain and pelvic pain.  Musculoskeletal: Positive for back pain. Negative for joint swelling, arthralgias and gait problem.  Skin: Negative for rash.  Neurological: Negative for dizziness, syncope, speech difficulty, weakness, numbness and headaches.  Hematological: Negative for adenopathy.  Psychiatric/Behavioral: Negative for behavioral problems, dysphoric mood and agitation. The patient is not nervous/anxious.        Objective:   Physical Exam  Constitutional: She is oriented to person, place, and time. She appears  well-developed and well-nourished. No distress.  Obese Blood pressure 140/90  HENT:  Head: Normocephalic.  Right Ear: External ear normal.  Left Ear: External ear normal.  Mouth/Throat: Oropharynx is clear and moist.  Eyes: Conjunctivae and EOM are normal. Pupils are equal, round, and reactive to light.  Neck: Normal range of motion. Neck supple. No thyromegaly present.  Cardiovascular: Normal rate, regular rhythm, normal heart sounds and intact distal pulses.   Pulmonary/Chest: Effort normal and breath sounds normal.  Abdominal: Soft. Bowel sounds are normal. She exhibits no mass. There is no tenderness.  Musculoskeletal: Normal range of motion.  Lymphadenopathy:    She has no cervical adenopathy.  Neurological: She is alert  and oriented to person, place, and time.  Skin: Skin is warm and dry. No rash noted.  Psychiatric: She has a normal mood and affect. Her behavior is normal.          Assessment & Plan:   Diabetes mellitus.  Will check a hemoglobin A1c Hypertension.  Blood pressure borderline.  We'll continue to monitor weight loss encouraged.  Home blood pressure monitoring recommended as well as low-salt diet Dyslipidemia.  Continue statin therapy Obesity.  Weight loss exercise encouraged  CPX 6 months

## 2016-02-13 NOTE — Patient Instructions (Signed)
Limit your sodium (Salt) intake   Please check your hemoglobin A1c every 3-6  Months  Please check your blood pressure on a regular basis.  If it is consistently greater than 150/90, please make an office appointment.    It is important that you exercise regularly, at least 20 minutes 3 to 4 times per week.  If you develop chest pain or shortness of breath seek  medical attention.  You need to lose weight.  Consider a lower calorie diet and regular exercise.  

## 2016-02-13 NOTE — Progress Notes (Signed)
Pre visit review using our clinic review tool, if applicable. No additional management support is needed unless otherwise documented below in the visit note. 

## 2016-02-14 ENCOUNTER — Other Ambulatory Visit: Payer: Self-pay | Admitting: Internal Medicine

## 2016-03-19 DIAGNOSIS — R6882 Decreased libido: Secondary | ICD-10-CM | POA: Diagnosis not present

## 2016-04-09 DIAGNOSIS — M461 Sacroiliitis, not elsewhere classified: Secondary | ICD-10-CM | POA: Diagnosis not present

## 2016-05-17 DIAGNOSIS — Z6841 Body Mass Index (BMI) 40.0 and over, adult: Secondary | ICD-10-CM | POA: Diagnosis not present

## 2016-05-17 DIAGNOSIS — R6882 Decreased libido: Secondary | ICD-10-CM | POA: Diagnosis not present

## 2016-05-23 ENCOUNTER — Other Ambulatory Visit: Payer: Self-pay | Admitting: Internal Medicine

## 2016-05-24 NOTE — Telephone Encounter (Signed)
Rx refill sent to pharmacy. 

## 2016-06-15 ENCOUNTER — Other Ambulatory Visit: Payer: Self-pay | Admitting: Internal Medicine

## 2016-06-15 NOTE — Telephone Encounter (Signed)
rx oxybutynin 15 mg sent to pharmacy

## 2016-07-12 DIAGNOSIS — R6882 Decreased libido: Secondary | ICD-10-CM | POA: Diagnosis not present

## 2016-07-14 DIAGNOSIS — J019 Acute sinusitis, unspecified: Secondary | ICD-10-CM | POA: Diagnosis not present

## 2016-07-23 DIAGNOSIS — M545 Low back pain: Secondary | ICD-10-CM | POA: Diagnosis not present

## 2016-07-23 DIAGNOSIS — M461 Sacroiliitis, not elsewhere classified: Secondary | ICD-10-CM | POA: Diagnosis not present

## 2016-07-25 DIAGNOSIS — Z23 Encounter for immunization: Secondary | ICD-10-CM | POA: Diagnosis not present

## 2016-08-17 DIAGNOSIS — M545 Low back pain: Secondary | ICD-10-CM | POA: Diagnosis not present

## 2016-08-21 ENCOUNTER — Telehealth: Payer: Self-pay | Admitting: Internal Medicine

## 2016-08-22 NOTE — Telephone Encounter (Signed)
Called and spoke with and she stated that she thinks that she has asthma.  She see's CY for sleep.  I advised the pt that she should see her PCP first and if they feel she needs to be referred over, they can do that.  Pt agreed.

## 2016-08-31 DIAGNOSIS — M47817 Spondylosis without myelopathy or radiculopathy, lumbosacral region: Secondary | ICD-10-CM | POA: Diagnosis not present

## 2016-08-31 DIAGNOSIS — M545 Low back pain: Secondary | ICD-10-CM | POA: Diagnosis not present

## 2016-09-05 DIAGNOSIS — R6882 Decreased libido: Secondary | ICD-10-CM | POA: Diagnosis not present

## 2016-09-24 DIAGNOSIS — M47817 Spondylosis without myelopathy or radiculopathy, lumbosacral region: Secondary | ICD-10-CM | POA: Diagnosis not present

## 2016-09-24 DIAGNOSIS — M461 Sacroiliitis, not elsewhere classified: Secondary | ICD-10-CM | POA: Diagnosis not present

## 2016-09-24 DIAGNOSIS — M545 Low back pain: Secondary | ICD-10-CM | POA: Diagnosis not present

## 2016-09-27 ENCOUNTER — Other Ambulatory Visit: Payer: Self-pay | Admitting: Internal Medicine

## 2016-10-05 ENCOUNTER — Other Ambulatory Visit (INDEPENDENT_AMBULATORY_CARE_PROVIDER_SITE_OTHER): Payer: BLUE CROSS/BLUE SHIELD

## 2016-10-05 DIAGNOSIS — Z0000 Encounter for general adult medical examination without abnormal findings: Secondary | ICD-10-CM | POA: Diagnosis not present

## 2016-10-05 LAB — POC URINALSYSI DIPSTICK (AUTOMATED)
Bilirubin, UA: NEGATIVE
Blood, UA: NEGATIVE
GLUCOSE UA: NEGATIVE
Ketones, UA: NEGATIVE
LEUKOCYTES UA: NEGATIVE
NITRITE UA: NEGATIVE
PROTEIN UA: NEGATIVE
Spec Grav, UA: 1.02
UROBILINOGEN UA: 0.2
pH, UA: 6.5

## 2016-10-05 LAB — LIPID PANEL
CHOLESTEROL: 156 mg/dL (ref 0–200)
HDL: 42.5 mg/dL (ref >39.00)
LDL CALC: 85 mg/dL (ref 0–99)
NonHDL: 113.43
TRIGLYCERIDES: 142 mg/dL (ref 0.0–149.0)
Total CHOL/HDL Ratio: 4
VLDL: 28.4 mg/dL (ref 0.0–40.0)

## 2016-10-05 LAB — TSH: TSH: 3.65 u[IU]/mL (ref 0.35–4.50)

## 2016-10-05 LAB — CBC WITH DIFFERENTIAL/PLATELET
BASOS ABS: 0 10*3/uL (ref 0.0–0.1)
Basophils Relative: 0.4 % (ref 0.0–3.0)
EOS ABS: 0.5 10*3/uL (ref 0.0–0.7)
Eosinophils Relative: 5.6 % — ABNORMAL HIGH (ref 0.0–5.0)
HCT: 42 % (ref 36.0–46.0)
Hemoglobin: 14.3 g/dL (ref 12.0–15.0)
LYMPHS ABS: 2.9 10*3/uL (ref 0.7–4.0)
Lymphocytes Relative: 35.7 % (ref 12.0–46.0)
MCHC: 34.1 g/dL (ref 30.0–36.0)
MCV: 85.1 fl (ref 78.0–100.0)
MONO ABS: 0.6 10*3/uL (ref 0.1–1.0)
MONOS PCT: 7.7 % (ref 3.0–12.0)
NEUTROS PCT: 50.6 % (ref 43.0–77.0)
Neutro Abs: 4.1 10*3/uL (ref 1.4–7.7)
PLATELETS: 291 10*3/uL (ref 150.0–400.0)
RBC: 4.94 Mil/uL (ref 3.87–5.11)
RDW: 13.8 % (ref 11.5–15.5)
WBC: 8.1 10*3/uL (ref 4.0–10.5)

## 2016-10-05 LAB — MICROALBUMIN / CREATININE URINE RATIO
CREATININE, U: 124.5 mg/dL
Microalb Creat Ratio: 0.6 mg/g (ref 0.0–30.0)

## 2016-10-05 LAB — BASIC METABOLIC PANEL
BUN: 13 mg/dL (ref 6–23)
CALCIUM: 9.4 mg/dL (ref 8.4–10.5)
CO2: 30 mEq/L (ref 19–32)
Chloride: 104 mEq/L (ref 96–112)
Creatinine, Ser: 0.79 mg/dL (ref 0.40–1.20)
GFR: 79.51 mL/min (ref >60.00)
GLUCOSE: 146 mg/dL — AB (ref 70–99)
Potassium: 4.4 mEq/L (ref 3.5–5.1)
SODIUM: 140 meq/L (ref 135–145)

## 2016-10-05 LAB — HEPATIC FUNCTION PANEL
ALK PHOS: 66 U/L (ref 39–117)
ALT: 16 U/L (ref 0–35)
AST: 14 U/L (ref 0–37)
Albumin: 4.1 g/dL (ref 3.5–5.2)
BILIRUBIN DIRECT: 0.1 mg/dL (ref 0.0–0.3)
BILIRUBIN TOTAL: 0.4 mg/dL (ref 0.2–1.2)
Total Protein: 6.7 g/dL (ref 6.0–8.3)

## 2016-10-05 LAB — HEMOGLOBIN A1C: Hgb A1c MFr Bld: 7.5 % — ABNORMAL HIGH (ref 4.6–6.5)

## 2016-10-15 ENCOUNTER — Ambulatory Visit (INDEPENDENT_AMBULATORY_CARE_PROVIDER_SITE_OTHER): Payer: BLUE CROSS/BLUE SHIELD | Admitting: Internal Medicine

## 2016-10-15 ENCOUNTER — Telehealth: Payer: Self-pay

## 2016-10-15 ENCOUNTER — Encounter: Payer: Self-pay | Admitting: Internal Medicine

## 2016-10-15 VITALS — BP 138/76 | HR 77 | Temp 97.8°F | Ht 62.5 in | Wt 231.2 lb

## 2016-10-15 DIAGNOSIS — Z0000 Encounter for general adult medical examination without abnormal findings: Secondary | ICD-10-CM | POA: Diagnosis not present

## 2016-10-15 MED ORDER — DAPAGLIFLOZIN PROPANEDIOL 10 MG PO TABS: 10.0000 mg | ORAL_TABLET | Freq: Every day | ORAL | 6 refills | Status: DC

## 2016-10-15 MED ORDER — METFORMIN HCL ER 500 MG PO TB24: ORAL_TABLET | ORAL | 6 refills | Status: DC

## 2016-10-15 NOTE — Progress Notes (Signed)
Subjective:    Patient ID: Kayla [name], female    DOB: [birth year], 58 y.o.   MRN: [id number]  HPI  Wt Readings from Last 3 Encounters:  10/15/16 231 lb 3.2 oz (104.9 kg)  02/13/16 232 lb (105.2 kg)  01/05/16 230 lb 12.8 oz (4.80 kg)   56 year old patient who is seen today for a preventive health examination. She has type 2 diabetes  Lab Results  Component Value Date   HGBA1C 7.5 (H) 10/05/2016    She has a history of dyslipidemia and is on statin therapy.  She is followed annually by gynecology.  Did have an eye examination last year. Mammogram 2017 Colonoscopy 2015 She is followed by pulmonary medicine for OSA.  Past Medical History:  Diagnosis Date  . Carpal tunnel syndrome of right wrist 10/2012  . Complication of anesthesia    hard to wake up after surg. 06/04/2012 and CO2 went up to 78  . Depressive disorder, not elsewhere classified   . Exertional dyspnea    occasionally  . 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 Headache(784.0)    due to allergies  . Heart murmur    states no problems  . Hyperlipidemia   . Mitral valve prolapse   . OSA on CPAP   . Osteoarthritis    knees  . Overactive bladder   . PTSD (post-traumatic stress disorder)   . Type II diabetes mellitus (HCC)    NIDDM     Social History   Social History  . Marital status: Married    Spouse name: N/A  . Number of children: 2  . Years of education: N/A   Occupational History  . Curator    Social History Main Topics  . Smoking status: Never Smoker  . Smokeless tobacco: Never Used  . Alcohol use No  . Drug use: No  . Sexual activity: Yes   Other Topics Concern  . Not on file   Social History Narrative  . No narrative on file    Past Surgical History:  Procedure Laterality Date  . BREAST BIOPSY Left 04/25/2001  . CARPAL TUNNEL RELEASE Right 10/24/2012   Procedure: CARPAL TUNNEL RELEASE;  Surgeon:  Ninetta Lights, MD;  Location: [facility];  Service: Orthopedics;  Laterality: Right;  RIGHT CARPAL TUNNEL RELEASE  . KNEE ARTHROSCOPY Right 04/01/2008  . KNEE ARTHROSCOPY W/ MENISCECTOMY Left 08/16/2011   partial medial  . LASIK     bilateral  . PUBOVAGINAL SLING  08/27/2001   cysto; suprapubic tube placement  . SHOULDER ARTHROSCOPY W/ ROTATOR CUFF REPAIR Left 2004  . TOTAL ABDOMINAL HYSTERECTOMY  08/27/2001  . TOTAL KNEE ARTHROPLASTY  02/06/2012   Procedure: TOTAL KNEE ARTHROPLASTY;  Surgeon: Ninetta Lights, MD;  Location: [facility];  Service: Orthopedics;  Laterality: Right;  total knee right side  . TOTAL KNEE ARTHROPLASTY  06/04/2012   Procedure: TOTAL KNEE ARTHROPLASTY;  Surgeon: Ninetta Lights, MD;  Location: [facility];  Service: Orthopedics;  Laterality: Left;  Marland Kitchen VAGINAL HYSTERECTOMY  1980's    Family History  Problem Relation Age of Onset  . Heart disease Father   . COPD Mother   . Heart disease Mother   . Heart attack Brother   . Colon cancer Neg Hx     Allergies  Allergen Reactions  . Morphine And Related Shortness Of Breath and Itching    Current Outpatient Prescriptions on  File Prior to Visit  Medication Sig Dispense Refill  . atorvastatin (LIPITOR) 20 MG tablet TAKE ONE TABLET BY MOUTH ONCE DAILY 30 tablet 5  . estradiol (ESTRACE) 0.5 MG tablet Take 1 mg by mouth daily.     . Loperamide HCl (IMODIUM PO) Take 2 tablets by mouth 2 (two) times daily as needed.     Marland Kitchen LORazepam (ATIVAN) 0.5 MG tablet Take 1 tablet (0.5 mg total) by mouth every 8 (eight) hours as needed. for anxiety 60 tablet 5  . meloxicam (MOBIC) 15 MG tablet TAKE ONE TABLET BY MOUTH ONCE DAILY 90 tablet 3  . oxybutynin (DITROPAN XL) 15 MG 24 hr tablet TAKE ONE TABLET BY MOUTH ONCE DAILY 90 tablet 1  . sertraline (ZOLOFT) 100 MG tablet TAKE ONE TABLET BY MOUTH ONCE DAILY 90 tablet 1  . testosterone cypionate (DEPOTESTOTERONE CYPIONATE) 100 MG/ML injection Inject 100 mg into the muscle every 28  (twenty-eight) days. For IM use only     No current facility-administered medications on file prior to visit.     BP 138/76 (BP Location: Right Arm, Patient Position: Sitting, Cuff Size: Normal)   Pulse 77   Temp 97.8 F (36.6 C) (Oral)   Ht 5' 2.5" (1.588 m)   Wt 231 lb 3.2 oz (104.9 kg)   SpO2 97%   BMI 41.61 kg/m    Review of Systems  Constitutional: Negative.   HENT: Negative for congestion, dental problem, hearing loss, rhinorrhea, sinus pressure, sore throat and tinnitus.   Eyes: Negative for pain, discharge and visual disturbance.  Respiratory: Negative for cough and shortness of breath.   Cardiovascular: Negative for chest pain, palpitations and leg swelling.  Gastrointestinal: Negative for abdominal distention, abdominal pain, blood in stool, constipation, diarrhea, nausea and vomiting.  Genitourinary: Negative for difficulty urinating, dysuria, flank pain, frequency, hematuria, pelvic pain, urgency, vaginal bleeding, vaginal discharge and vaginal pain.  Musculoskeletal: Negative for arthralgias, gait problem and joint swelling.  Skin: Negative for rash.  Neurological: Negative for dizziness, syncope, speech difficulty, weakness, numbness and headaches.  Hematological: Negative for adenopathy.  Psychiatric/Behavioral: Negative for agitation, behavioral problems and dysphoric mood. The patient is not nervous/anxious.        Objective:   Physical Exam  Constitutional: She is oriented to person, place, and time. She appears well-developed and well-nourished.  Weight 231 Blood pressure 130/76   HENT:  Head: Normocephalic and atraumatic.  Right Ear: External ear normal.  Left Ear: External ear normal.  Mouth/Throat: Oropharynx is clear and moist.  Eyes: Conjunctivae and EOM are normal.  Neck: Normal range of motion. Neck supple. No JVD present. No thyromegaly present.  Cardiovascular: Normal rate, regular rhythm, normal heart sounds and intact distal pulses.   No  murmur heard. Pulmonary/Chest: Effort normal and breath sounds normal. She has no wheezes. She has no rales.  Abdominal: Soft. Bowel sounds are normal. She exhibits no distension and no mass. There is no tenderness. There is no rebound and no guarding.  Musculoskeletal: Normal range of motion. She exhibits no edema or tenderness.  Neurological: She is alert and oriented to person, place, and time. She has normal reflexes. No cranial nerve deficit. She exhibits normal muscle tone. Coordination normal.  Skin: Skin is warm and dry. No rash noted.  Psychiatric: She has a normal mood and affect. Her behavior is normal.          Assessment & Plan:   Preventive health examination Diabetes mellitus.  Suboptimal control with hemoglobin A1c 7.5.  Nonpharmacologic measures discussed.  Will increase metformin to 2000 mg daily in divided dosages.  Add Farxiga 5 mg daily for 2 weeks, then increase to 10 mg daily.  Recheck 3 months Essential hypertension, stable Exogenous obesity.  Lifestyle issues discussed.  Weight loss encouraged  Follow-up 3 months  Marabeth Melland Pilar Plate

## 2016-10-15 NOTE — Patient Instructions (Signed)
Farxiga 5 mg daily for 2 weeks, then start 10 mg daily  Increase metformin to 2 tablets twice daily  You need to lose weight.  Consider a lower calorie diet and regular exercise.    It is important that you exercise regularly, at least 20 minutes 3 to 4 times per week.  If you develop chest pain or shortness of breath seek  medical attention.  Return in 3 months for follow-up   Please check your hemoglobin A1c every 3 months

## 2016-10-15 NOTE — Progress Notes (Signed)
Pre visit review using our clinic review tool, if applicable. No additional management support is needed unless otherwise documented below in the visit note. 

## 2016-10-15 NOTE — Telephone Encounter (Signed)
Received PA request from [facility] for Farxiga 10 mg tablets. PA submitted & is pending. Key: [id number]

## 2016-10-17 NOTE — Telephone Encounter (Signed)
PA denied. Patient has to try two covered alternatives:  Invokana, Invokamet, Invokamet XR, Synjardy, Synjardy XR, or Jardiance.

## 2016-10-17 NOTE — Telephone Encounter (Signed)
Please advise 

## 2016-10-18 MED ORDER — EMPAGLIFLOZIN 25 MG PO TABS: 25.0000 mg | ORAL_TABLET | Freq: Every day | ORAL | 6 refills | Status: DC

## 2016-10-18 NOTE — Telephone Encounter (Signed)
Spoke with pt and made her aware of mediation changes. Pt verbalized understanding.

## 2016-10-18 NOTE — Addendum Note (Signed)
Addended by: Abelardo Diesel on: 10/18/2016 08:09 AM   Modules accepted: Orders

## 2016-10-18 NOTE — Telephone Encounter (Signed)
Jardiance 25 mg once  Daily  #30, refill times 6

## 2016-10-25 ENCOUNTER — Other Ambulatory Visit: Payer: Self-pay | Admitting: Internal Medicine

## 2016-10-25 MED ORDER — DAPAGLIFLOZIN PROPANEDIOL 10 MG PO TABS: 10.0000 mg | ORAL_TABLET | Freq: Every day | ORAL | 6 refills | Status: DC

## 2016-11-09 DIAGNOSIS — J9801 Acute bronchospasm: Secondary | ICD-10-CM | POA: Diagnosis not present

## 2016-11-09 DIAGNOSIS — R05 Cough: Secondary | ICD-10-CM | POA: Diagnosis not present

## 2016-11-26 DIAGNOSIS — Z01419 Encounter for gynecological examination (general) (routine) without abnormal findings: Secondary | ICD-10-CM | POA: Diagnosis not present

## 2016-11-26 DIAGNOSIS — Z6839 Body mass index (BMI) 39.0-39.9, adult: Secondary | ICD-10-CM | POA: Diagnosis not present

## 2016-11-26 DIAGNOSIS — Z124 Encounter for screening for malignant neoplasm of cervix: Secondary | ICD-10-CM | POA: Diagnosis not present

## 2016-11-26 DIAGNOSIS — Z1231 Encounter for screening mammogram for malignant neoplasm of breast: Secondary | ICD-10-CM | POA: Diagnosis not present

## 2016-12-06 ENCOUNTER — Telehealth: Payer: Self-pay

## 2016-12-06 NOTE — Telephone Encounter (Signed)
Spoke with pt and she c/o productive cough, mild wheeze and some ShOB for several weeks. She denies fever or chest pain. She wanted to schedule OV for [facility], but advised she needs to be evaluated sooner and recommended she go to [facility] for evaluation and possible treatment. Pt agreed and will go to UC today. Nothing further needed at this time.

## 2016-12-07 DIAGNOSIS — J018 Other acute sinusitis: Secondary | ICD-10-CM | POA: Diagnosis not present

## 2016-12-07 DIAGNOSIS — R05 Cough: Secondary | ICD-10-CM | POA: Diagnosis not present

## 2016-12-07 DIAGNOSIS — J45998 Other asthma: Secondary | ICD-10-CM | POA: Diagnosis not present

## 2016-12-08 DIAGNOSIS — R0902 Hypoxemia: Secondary | ICD-10-CM | POA: Diagnosis not present

## 2016-12-08 DIAGNOSIS — R0602 Shortness of breath: Secondary | ICD-10-CM | POA: Diagnosis not present

## 2016-12-08 DIAGNOSIS — R05 Cough: Secondary | ICD-10-CM | POA: Diagnosis not present

## 2016-12-08 DIAGNOSIS — J45998 Other asthma: Secondary | ICD-10-CM | POA: Diagnosis not present

## 2016-12-09 DIAGNOSIS — G4733 Obstructive sleep apnea (adult) (pediatric): Secondary | ICD-10-CM | POA: Diagnosis present

## 2016-12-09 DIAGNOSIS — J209 Acute bronchitis, unspecified: Secondary | ICD-10-CM | POA: Diagnosis not present

## 2016-12-09 DIAGNOSIS — J45901 Unspecified asthma with (acute) exacerbation: Secondary | ICD-10-CM | POA: Diagnosis not present

## 2016-12-09 DIAGNOSIS — R0602 Shortness of breath: Secondary | ICD-10-CM | POA: Diagnosis not present

## 2016-12-09 DIAGNOSIS — R05 Cough: Secondary | ICD-10-CM | POA: Diagnosis not present

## 2016-12-09 DIAGNOSIS — Z6841 Body Mass Index (BMI) 40.0 and over, adult: Secondary | ICD-10-CM | POA: Diagnosis not present

## 2016-12-09 DIAGNOSIS — Z79899 Other long term (current) drug therapy: Secondary | ICD-10-CM | POA: Diagnosis not present

## 2016-12-09 DIAGNOSIS — M1990 Unspecified osteoarthritis, unspecified site: Secondary | ICD-10-CM | POA: Diagnosis present

## 2016-12-09 DIAGNOSIS — R0902 Hypoxemia: Secondary | ICD-10-CM | POA: Diagnosis not present

## 2016-12-09 DIAGNOSIS — E119 Type 2 diabetes mellitus without complications: Secondary | ICD-10-CM | POA: Diagnosis not present

## 2016-12-09 DIAGNOSIS — F332 Major depressive disorder, recurrent severe without psychotic features: Secondary | ICD-10-CM | POA: Diagnosis not present

## 2016-12-09 DIAGNOSIS — Z96653 Presence of artificial knee joint, bilateral: Secondary | ICD-10-CM | POA: Diagnosis present

## 2016-12-09 DIAGNOSIS — J45998 Other asthma: Secondary | ICD-10-CM | POA: Diagnosis not present

## 2016-12-09 DIAGNOSIS — E6609 Other obesity due to excess calories: Secondary | ICD-10-CM | POA: Diagnosis not present

## 2016-12-09 DIAGNOSIS — E7800 Pure hypercholesterolemia, unspecified: Secondary | ICD-10-CM | POA: Diagnosis present

## 2016-12-09 DIAGNOSIS — J40 Bronchitis, not specified as acute or chronic: Secondary | ICD-10-CM | POA: Diagnosis not present

## 2016-12-09 DIAGNOSIS — R918 Other nonspecific abnormal finding of lung field: Secondary | ICD-10-CM | POA: Diagnosis not present

## 2016-12-09 DIAGNOSIS — Z7984 Long term (current) use of oral hypoglycemic drugs: Secondary | ICD-10-CM | POA: Diagnosis not present

## 2016-12-09 DIAGNOSIS — Z886 Allergy status to analgesic agent status: Secondary | ICD-10-CM | POA: Diagnosis not present

## 2016-12-17 ENCOUNTER — Other Ambulatory Visit: Payer: Self-pay | Admitting: Internal Medicine

## 2016-12-24 ENCOUNTER — Encounter: Payer: Self-pay | Admitting: Internal Medicine

## 2016-12-24 ENCOUNTER — Ambulatory Visit (INDEPENDENT_AMBULATORY_CARE_PROVIDER_SITE_OTHER): Payer: BLUE CROSS/BLUE SHIELD | Admitting: Internal Medicine

## 2016-12-24 VITALS — BP 142/92 | HR 79 | Temp 98.7°F | Ht 62.5 in | Wt 220.0 lb

## 2016-12-24 DIAGNOSIS — J209 Acute bronchitis, unspecified: Secondary | ICD-10-CM | POA: Diagnosis not present

## 2016-12-24 DIAGNOSIS — J302 Other seasonal allergic rhinitis: Secondary | ICD-10-CM

## 2016-12-24 DIAGNOSIS — E119 Type 2 diabetes mellitus without complications: Secondary | ICD-10-CM | POA: Diagnosis not present

## 2016-12-24 DIAGNOSIS — I10 Essential (primary) hypertension: Secondary | ICD-10-CM | POA: Diagnosis not present

## 2016-12-24 DIAGNOSIS — J3089 Other allergic rhinitis: Secondary | ICD-10-CM | POA: Diagnosis not present

## 2016-12-24 DIAGNOSIS — J45901 Unspecified asthma with (acute) exacerbation: Secondary | ICD-10-CM

## 2016-12-24 NOTE — Progress Notes (Signed)
Subjective:    Patient ID: Kayla [name], female    DOB: 01-29-1961, 56 y.o.   MRN: [id number]  HPI 56 year old patient who is seen following a recent hospital discharge 6 days ago.  She was admitted with acute asthmatic bronchitis and was hospitalized for 4 days.  She was discharged on antibiotics and a prednisone taper, which she has completed.  She was discharged on nasal cannula O2 at 2 L/m. She does have a history of allergic rhinitis but no prior history of asthma She has type 2 diabetes. Has done quite well since her hospital discharge.  No wheezing She was discharged on Advair as well as albuterol 3-4 times daily She is very anxious to discontinue oxygen therapy  Past Medical History:  Diagnosis Date  . Carpal tunnel syndrome of right wrist 10/2012  . Complication of anesthesia    hard to wake up after surg. 06/04/2012 and CO2 went up to 78  . Depressive disorder, not elsewhere classified   . Exertional dyspnea    occasionally  . 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 Headache(784.0)    due to allergies  . Heart murmur    states no problems  . Hyperlipidemia   . Mitral valve prolapse   . OSA on CPAP   . Osteoarthritis    knees  . Overactive bladder   . PTSD (post-traumatic stress disorder)   . Type II diabetes mellitus (HCC)    NIDDM     Social History   Social History  . Marital status: Married    Spouse name: N/A  . Number of children: 2  . Years of education: N/A   Occupational History  . Curator    Social History Main Topics  . Smoking status: Never Smoker  . Smokeless tobacco: Never Used  . Alcohol use No  . Drug use: No  . Sexual activity: Yes   Other Topics Concern  . Not on file   Social History Narrative  . No narrative on file    Past Surgical History:  Procedure Laterality Date  . BREAST BIOPSY Left 04/25/2001  . CARPAL TUNNEL RELEASE Right 10/24/2012   Procedure: CARPAL TUNNEL RELEASE;  Surgeon: Ninetta Lights, MD;  Location: [facility];  Service: Orthopedics;  Laterality: Right;  RIGHT CARPAL TUNNEL RELEASE  . KNEE ARTHROSCOPY Right 04/01/2008  . KNEE ARTHROSCOPY W/ MENISCECTOMY Left 08/16/2011   partial medial  . LASIK     bilateral  . PUBOVAGINAL SLING  08/27/2001   cysto; suprapubic tube placement  . SHOULDER ARTHROSCOPY W/ ROTATOR CUFF REPAIR Left 2004  . TOTAL ABDOMINAL HYSTERECTOMY  08/27/2001  . TOTAL KNEE ARTHROPLASTY  02/06/2012   Procedure: TOTAL KNEE ARTHROPLASTY;  Surgeon: Ninetta Lights, MD;  Location: [facility];  Service: Orthopedics;  Laterality: Right;  total knee right side  . TOTAL KNEE ARTHROPLASTY  06/04/2012   Procedure: TOTAL KNEE ARTHROPLASTY;  Surgeon: Ninetta Lights, MD;  Location: [facility];  Service: Orthopedics;  Laterality: Left;  Marland Kitchen VAGINAL HYSTERECTOMY  1980's    Family History  Problem Relation Age of Onset  . Heart disease Father   . COPD Mother   . Heart disease Mother   . Heart attack Brother   . Colon cancer Neg Hx     Allergies  Allergen Reactions  . Morphine And Related Shortness Of Breath and Itching    Current Outpatient Prescriptions on  File Prior to Visit  Medication Sig Dispense Refill  . atorvastatin (LIPITOR) 20 MG tablet TAKE ONE TABLET BY MOUTH ONCE DAILY 30 tablet 5  . empagliflozin (JARDIANCE) 25 MG TABS tablet Take 25 mg by mouth daily. 30 tablet 6  . estradiol (ESTRACE) 0.5 MG tablet Take 1 mg by mouth daily.     . Loperamide HCl (IMODIUM PO) Take 2 tablets by mouth 2 (two) times daily as needed.     Marland Kitchen LORazepam (ATIVAN) 0.5 MG tablet Take 1 tablet (0.5 mg total) by mouth every 8 (eight) hours as needed. for anxiety 60 tablet 5  . meloxicam (MOBIC) 15 MG tablet TAKE ONE TABLET BY MOUTH ONCE DAILY 90 tablet 3  . metFORMIN (GLUCOPHAGE-XR) 500 MG 24 hr tablet 2 tablets twice daily 180 tablet 6  . oxybutynin (DITROPAN XL) 15 MG 24 hr tablet TAKE ONE TABLET BY MOUTH ONCE  DAILY 90 tablet 1  . sertraline (ZOLOFT) 100 MG tablet TAKE ONE TABLET BY MOUTH ONCE DAILY 90 tablet 1  . testosterone cypionate (DEPOTESTOTERONE CYPIONATE) 100 MG/ML injection Inject 100 mg into the muscle every 28 (twenty-eight) days. For IM use only     No current facility-administered medications on file prior to visit.     BP (!) 142/92 (BP Location: Left Arm, Patient Position: Sitting, Cuff Size: Normal)   Pulse 79   Temp 98.7 F (37.1 C) (Oral)   Ht 5' 2.5" (1.588 m)   Wt 220 lb (99.8 kg)   SpO2 97%   BMI 39.60 kg/m      Review of Systems  Constitutional: Negative.   HENT: Negative for congestion, dental problem, hearing loss, rhinorrhea, sinus pressure, sore throat and tinnitus.   Eyes: Negative for pain, discharge and visual disturbance.  Respiratory: Positive for cough, shortness of breath and wheezing.   Cardiovascular: Negative for chest pain, palpitations and leg swelling.  Gastrointestinal: Negative for abdominal distention, abdominal pain, blood in stool, constipation, diarrhea, nausea and vomiting.  Genitourinary: Negative for difficulty urinating, dysuria, flank pain, frequency, hematuria, pelvic pain, urgency, vaginal bleeding, vaginal discharge and vaginal pain.  Musculoskeletal: Negative for arthralgias, gait problem and joint swelling.  Skin: Negative for rash.  Neurological: Negative for dizziness, syncope, speech difficulty, weakness, numbness and headaches.  Hematological: Negative for adenopathy.  Psychiatric/Behavioral: Negative for agitation, behavioral problems and dysphoric mood. The patient is not nervous/anxious.        Objective:   Physical Exam  Constitutional: She is oriented to person, place, and time. She appears well-developed and well-nourished.  HENT:  Head: Normocephalic.  Right Ear: External ear normal.  Left Ear: External ear normal.  Mouth/Throat: Oropharynx is clear and moist.  Eyes: Conjunctivae and EOM are normal. Pupils are  equal, round, and reactive to light.  Neck: Normal range of motion. Neck supple. No thyromegaly present.  Cardiovascular: Normal rate, regular rhythm, normal heart sounds and intact distal pulses.   Pulmonary/Chest: Effort normal and breath sounds normal. No respiratory distress. She has no wheezes. She has no rales.  O2 saturation 97% off oxygen therapy  Abdominal: Soft. Bowel sounds are normal. She exhibits no mass. There is no tenderness.  Musculoskeletal: Normal range of motion.  Lymphadenopathy:    She has no cervical adenopathy.  Neurological: She is alert and oriented to person, place, and time.  Skin: Skin is warm and dry. No rash noted.  Psychiatric: She has a normal mood and affect. Her behavior is normal.  Assessment & Plan:   Status post possible mission for asthmatic bronchitis.  Clinically stable We'll discontinue oxygen therapy and change albuterol to when necessary We'll continue Advair until present supply completed and then discontinue Diabetes mellitus.  No change in therapy.  Check hemoglobin A1c in 3 months History of allergic rhinitis Essential hypertension, stable  Recheck 3 months  [id number],PETER Pilar Plate

## 2016-12-24 NOTE — Progress Notes (Signed)
Pre visit review using our clinic review tool, if applicable. No additional management support is needed unless otherwise documented below in the visit note. 

## 2016-12-24 NOTE — Patient Instructions (Addendum)
Discontinue oxygen therapy  Continue Advair twice a day until present supply completed, then challenge off this medication  Okay to use albuterol as needed only for wheezing  Return in 3 months for follow-up

## 2016-12-31 ENCOUNTER — Other Ambulatory Visit: Payer: Self-pay | Admitting: Internal Medicine

## 2016-12-31 NOTE — Telephone Encounter (Signed)
Last filled 02/13/16 #60 with 5 refills CPX on 10/15/16 Follow up scheduled for 01/18/17 Please advise.  Thanks!!

## 2016-12-31 NOTE — Telephone Encounter (Signed)
Called to the pharmacy and left on machine. 

## 2016-12-31 NOTE — Telephone Encounter (Signed)
Okay for refill?  

## 2017-01-04 ENCOUNTER — Encounter: Payer: Self-pay | Admitting: Internal Medicine

## 2017-01-04 ENCOUNTER — Ambulatory Visit (INDEPENDENT_AMBULATORY_CARE_PROVIDER_SITE_OTHER): Payer: BLUE CROSS/BLUE SHIELD | Admitting: Internal Medicine

## 2017-01-04 ENCOUNTER — Other Ambulatory Visit (INDEPENDENT_AMBULATORY_CARE_PROVIDER_SITE_OTHER): Payer: BLUE CROSS/BLUE SHIELD

## 2017-01-04 VITALS — BP 130/78 | HR 86 | Resp 16 | Ht 63.0 in | Wt 225.8 lb

## 2017-01-04 DIAGNOSIS — J209 Acute bronchitis, unspecified: Secondary | ICD-10-CM | POA: Diagnosis not present

## 2017-01-04 DIAGNOSIS — G4733 Obstructive sleep apnea (adult) (pediatric): Secondary | ICD-10-CM | POA: Diagnosis not present

## 2017-01-04 DIAGNOSIS — J4520 Mild intermittent asthma, uncomplicated: Secondary | ICD-10-CM

## 2017-01-04 DIAGNOSIS — IMO0001 Reserved for inherently not codable concepts without codable children: Secondary | ICD-10-CM

## 2017-01-04 LAB — CBC WITH DIFFERENTIAL/PLATELET
BASOS PCT: 0.4 % (ref 0.0–3.0)
Basophils Absolute: 0 10*3/uL (ref 0.0–0.1)
EOS ABS: 0.5 10*3/uL (ref 0.0–0.7)
Eosinophils Relative: 6.1 % — ABNORMAL HIGH (ref 0.0–5.0)
HCT: 43.8 % (ref 36.0–46.0)
Hemoglobin: 14.8 g/dL (ref 12.0–15.0)
LYMPHS ABS: 2.6 10*3/uL (ref 0.7–4.0)
LYMPHS PCT: 32 % (ref 12.0–46.0)
MCHC: 33.8 g/dL (ref 30.0–36.0)
MCV: 86 fl (ref 78.0–100.0)
MONO ABS: 0.7 10*3/uL (ref 0.1–1.0)
Monocytes Relative: 8.4 % (ref 3.0–12.0)
NEUTROS PCT: 53.1 % (ref 43.0–77.0)
Neutro Abs: 4.2 10*3/uL (ref 1.4–7.7)
PLATELETS: 273 10*3/uL (ref 150.0–400.0)
RBC: 5.09 Mil/uL (ref 3.87–5.11)
RDW: 14.5 % (ref 11.5–15.5)
WBC: 8 10*3/uL (ref 4.0–10.5)

## 2017-01-04 LAB — NITRIC OXIDE: NITRIC OXIDE: 42

## 2017-01-04 NOTE — Progress Notes (Signed)
Subjective:    Patient ID: Kayla [name], female    DOB: 1961-03-04, 56 y.o.   MRN: [id number]  HPI female never smoker followed for OSA/Insomnia, Asthma, allergic rhinitis complicated by HBP, depression, DM NPSG 01/30/11-AHI 19.9/hour, desaturation to 88%, CPAP titration to 9, body weight 230 pounds FENO 01/04/17- 42 H Office Spirometry 01/04/17-mild restriction of exhaled volume. FVC 2.51/78%, FEV1 2.06/82%, ratio 0.82, FEF25-75% 2.27/95% --------------------------------------------------------------------------------------------  01/05/2016-57 year old female never smoker followed for OSA/Insomnia, complicated by HBP, depression, DM FOLLOWS FOR: DME [facility]. Pt states she wears CPAP every night for 6-8 hours; pressure working well and no DL at this time-will need to place order.  CPAP 9/[facility]  01/04/17- female never smoker followed for OSA/Insomnia, complicated by HBP, depression, DM FOLLOWS FOR DME [facility] DL ATTACHED PATIENT NEEDS A NEW CPAP MACHINE AND SUPPLIES PATIENT WANTS HER OXYGEN TANK PICKED UP  CPAP 9/[facility]> auto 5-15 today Oxygen was apparently by Dr Ronne Binning at [facility]) early March, 2018 on discharge after hosp for acute asthmatic bronchitis she blames on pollen. She has not been using it. Finished prednisone 2 weeks ago.  She has only an albuterol rescue inhaler which she has used only once since hospital discharge. Gives history of seasonal allergic rhinitis which she says is not bad this spring. She continues to use CPAP all night every night and feels she benefits with better sleep quality. FENO 01/04/17- 42 H Office Spirometry 01/04/17-mild restriction of exhaled volume. FVC 2.51/78%, FEV1 2.06/82%, ratio 0.82, FEF25-75% 2.27/95%  ROS-see HPI            + = pos Constitutional:   No-   weight loss, night sweats, fevers, chills, fatigue, lassitude. HEENT:   No-  headaches, difficulty swallowing, tooth/dental problems, sore throat,       No-  sneezing,  itching, ear ache, +nasal congestion, post nasal drip,  CV:  No-   chest pain, orthopnea, PND, swelling in lower extremities, anasarca,  dizziness, palpitations Resp:  No hemoptysis  Skin: No-   rash or lesions. GI:  No-   heartburn, indigestion, abdominal pain, nausea, vomiting,  GU: . MS:  No-   joint pain or swelling.  . Neuro-     nothing unusual Psych:  No- change in mood or affect. No depression or anxiety.  No memory loss.  OBJ- Physical Exam General- Alert, Oriented, Affect-appropriate, Distress- none acute, + overweight Skin- rash-none, lesions- none, excoriation- none Lymphadenopathy- none Head- atraumatic            Eyes- Gross vision intact, PERRLA, conjunctivae and secretions clear            Ears- Hearing, canals-normal            Nose- Clear, no-Septal dev, mucus, polyps, erosion, perforation             Throat- Mallampati III , mucosa clear , drainage- none, tonsils- atrophic Neck- flexible , trachea midline, no stridor , thyroid nl, carotid no bruit Chest - symmetrical excursion , unlabored           Heart/CV- RRR , no murmur , no gallop  , no rub, nl s1 s2                           - JVD- none , edema- none, stasis changes- none, varices- none           Lung- clear to P&A, wheeze- none, cough- none , dullness-none, rub- none  Chest wall-  Abd-  Br/ Gen/ Rectal- Not done, not indicated Extrem- cyanosis- none, clubbing, none, atrophy- none, strength- nl Neuro- grossly intact to observation

## 2017-01-04 NOTE — Assessment & Plan Note (Signed)
She has been very compliant and successful, clearly benefiting from CPAP. Her machine is now old and due for replacement. We will take the opportunity to change to AutoPap 5-15

## 2017-01-04 NOTE — Assessment & Plan Note (Addendum)
She attributed her exacerbation to "pollen". She does give history of allergic rhinitis and FENO score is elevated. She doesn't describe an especially intense exposure so I'm not clear why she has done so well as the tree pollen season continues, following her hospital stay. She has only used her rescue inhaler once. At this point she doesn't seem to need a maintenance controller- we're watching that issue.

## 2017-01-04 NOTE — Patient Instructions (Addendum)
Order- DME [facility]-   Dc O2                                            Please replace old CPAP machine. Change to auto 5-15, mask of choice, humidifier, supplies, AirView   Dx OSA.         Needs new mask and supplies please.   Order- FENO      Dx asthma moderate intermittent              Office spirometry   Order- lab     CBC w diff, Allergy profile        Please call as needed   Staff please request discharge summary from recent hosp at [facility]

## 2017-01-07 LAB — RESPIRATORY ALLERGY PANEL REGION II W/ RFLX: ~~LOC~~
Allergen, A. alternata, m6: <0.1 kU/L
Allergen, C. Herbarum, M2: <0.1 kU/L
Allergen, Cedar tree, t12: <0.1 kU/L
Allergen, Comm Silver Birch, t9: <0.1 kU/L
Allergen, Cottonwood, t14: <0.1 kU/L
Allergen, D pternoyssinus,d7: <0.1 kU/L
Allergen, Mouse Urine Protein, e78: <0.1 kU/L
Allergen, Mulberry, t76: <0.1 kU/L
Allergen, Oak,t7: <0.1 kU/L
Allergen, P. notatum, m1: <0.1 kU/L
Aspergillus fumigatus, m3: <0.1 kU/L
Bermuda Grass: <0.1 kU/L
Box Elder IgE: <0.1 kU/L
Cat Dander: <0.1 kU/L
Cockroach: <0.1 kU/L
Common Ragweed: <0.1 kU/L
D. farinae: <0.1 kU/L
Dog Dander: <0.1 kU/L
Elm IgE: <0.1 kU/L
IgE (Immunoglobulin E), Serum: 13 kU/L
Johnson Grass: <0.1 kU/L
Pecan/Hickory Tree IgE: <0.1 kU/L
Rough Pigweed  IgE: <0.1 kU/L
Sheep Sorrel IgE: <0.1 kU/L
Timothy Grass: <0.1 kU/L

## 2017-01-14 ENCOUNTER — Ambulatory Visit: Payer: BLUE CROSS/BLUE SHIELD | Admitting: Internal Medicine

## 2017-01-18 ENCOUNTER — Ambulatory Visit: Payer: BLUE CROSS/BLUE SHIELD | Admitting: Internal Medicine

## 2017-01-22 ENCOUNTER — Other Ambulatory Visit: Payer: Self-pay | Admitting: Internal Medicine

## 2017-01-23 DIAGNOSIS — R6882 Decreased libido: Secondary | ICD-10-CM | POA: Diagnosis not present

## 2017-02-15 ENCOUNTER — Ambulatory Visit (INDEPENDENT_AMBULATORY_CARE_PROVIDER_SITE_OTHER): Payer: BLUE CROSS/BLUE SHIELD | Admitting: Adult Health

## 2017-02-15 ENCOUNTER — Encounter: Payer: Self-pay | Admitting: Adult Health

## 2017-02-15 VITALS — BP 130/90 | HR 72 | Temp 98.0°F | Wt 222.2 lb

## 2017-02-15 DIAGNOSIS — J4521 Mild intermittent asthma with (acute) exacerbation: Secondary | ICD-10-CM | POA: Diagnosis not present

## 2017-02-15 MED ORDER — IPRATROPIUM-ALBUTEROL 0.5-2.5 (3) MG/3ML IN SOLN
3.0000 mL | Freq: Once | RESPIRATORY_TRACT | Status: AC
  Administered 2017-02-15: 3 mL via RESPIRATORY_TRACT

## 2017-02-15 MED ORDER — PREDNISONE 10 MG PO TABS: ORAL_TABLET | ORAL | 0 refills | Status: DC

## 2017-02-15 NOTE — Progress Notes (Signed)
Subjective:    Patient ID: Kayla [name], female    DOB: 1961-03-21, 56 y.o.   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   She is a patient of Dr. Raliegh Ip who I am seeing today for the first time for an acute issue of asthma exacerbation. She reports that her symptoms have been present for 4 days. Her symptoms include wheezing, SOB, and dry cough. She has been using her rescue inhaler as directed   Review of Systems See HPI   Past Medical History:  Diagnosis Date  . Carpal tunnel syndrome of right wrist 10/2012  . Complication of anesthesia    hard to wake up after surg. 06/04/2012 and CO2 went up to 78  . Depressive disorder, not elsewhere classified   . Exertional dyspnea    occasionally  . 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 Headache(784.0)    due to allergies  . Heart murmur    states no problems  . Hyperlipidemia   . Mitral valve prolapse   . OSA on CPAP   . Osteoarthritis    knees  . Overactive bladder   . PTSD (post-traumatic stress disorder)   . Type II diabetes mellitus ([facility])    NIDDM    Social History   Social History  . Marital status: Married    Spouse name: N/A  . Number of children: 2  . Years of education: N/A   Occupational History  . Curator    Social History Main Topics  . Smoking status: Never Smoker  . Smokeless tobacco: Never Used  . Alcohol use No  . Drug use: No  . Sexual activity: Yes   Other Topics Concern  . Not on file   Social History Narrative  . No narrative  on file    Past Surgical History:  Procedure Laterality Date  . BREAST BIOPSY Left 04/25/2001  . CARPAL TUNNEL RELEASE Right 10/24/2012   Procedure: CARPAL TUNNEL RELEASE;  Surgeon: Ninetta Lights, MD;  Location: [place];  Service: Orthopedics;  Laterality: Right;  RIGHT CARPAL TUNNEL RELEASE  . KNEE ARTHROSCOPY Right 04/01/2008  . KNEE ARTHROSCOPY W/ MENISCECTOMY Left 08/16/2011   partial medial  . LASIK     bilateral  . PUBOVAGINAL SLING  08/27/2001   cysto; suprapubic tube placement  . SHOULDER ARTHROSCOPY W/ ROTATOR CUFF REPAIR Left 2004  . TOTAL ABDOMINAL HYSTERECTOMY  08/27/2001  . TOTAL KNEE ARTHROPLASTY  02/06/2012   Procedure: TOTAL KNEE ARTHROPLASTY;  Surgeon: Ninetta Lights, MD;  Location: [place];  Service: Orthopedics;  Laterality: Right;  total knee right side  . TOTAL KNEE ARTHROPLASTY  06/04/2012   Procedure: TOTAL KNEE ARTHROPLASTY;  Surgeon: Ninetta Lights, MD;  Location: [place];  Service: Orthopedics;  Laterality: Left;  Marland Kitchen VAGINAL HYSTERECTOMY  1980's    Family History  Problem Relation Age of Onset  . Heart disease Father   . COPD Mother   . Heart disease Mother   . Heart attack Brother   . Colon cancer Neg Hx     Allergies  Allergen Reactions  .  Morphine And Related Shortness Of Breath and Itching    Current Outpatient Prescriptions on File Prior to Visit  Medication Sig Dispense Refill  . albuterol (PROVENTIL HFA) 108 (90 Base) MCG/ACT inhaler Inhale 2 puffs into the lungs every 6 (six) hours as needed for wheezing or shortness of breath.    Marland Kitchen atorvastatin (LIPITOR) 20 MG tablet TAKE ONE TABLET BY MOUTH ONCE DAILY 30 tablet 5  . empagliflozin (JARDIANCE) 25 MG TABS tablet Take 25 mg by mouth daily. 30 tablet 6  . estradiol (ESTRACE) 0.5 MG tablet Take 1 mg by mouth daily.     . Loperamide HCl (IMODIUM PO) Take 2 tablets by mouth 2 (two) times daily as needed.     Marland Kitchen LORazepam (ATIVAN) 0.5 MG tablet TAKE ONE TABLET BY MOUTH EVERY 8 HOURS AS  NEEDED FOR ANXIETY 60 tablet 5  . meloxicam (MOBIC) 15 MG tablet TAKE ONE TABLET BY MOUTH ONCE DAILY 90 tablet 3  . metFORMIN (GLUCOPHAGE-XR) 500 MG 24 hr tablet 2 tablets twice daily 180 tablet 6  . oxybutynin (DITROPAN XL) 15 MG 24 hr tablet TAKE ONE TABLET BY MOUTH ONCE DAILY 90 tablet 1  . sertraline (ZOLOFT) 100 MG tablet TAKE ONE TABLET BY MOUTH ONCE DAILY 90 tablet 1  . testosterone cypionate (DEPOTESTOTERONE CYPIONATE) 100 MG/ML injection Inject 100 mg into the muscle every 28 (twenty-eight) days. For IM use only     No current facility-administered medications on file prior to visit.     BP 130/90 (BP Location: Left Arm, Patient Position: Sitting, Cuff Size: Large)   Pulse 72   Temp 98 F (36.7 C) (Oral)   Wt 222 lb 3.2 oz (100.8 kg)   SpO2 96%   BMI 39.36 kg/m       Objective:   Physical Exam  Constitutional: She is oriented to person, place, and time. She appears well-developed and well-nourished. No distress.  Cardiovascular: Normal rate, regular rhythm, normal heart sounds and intact distal pulses.  Exam reveals no gallop and no friction rub.   No murmur heard. Pulmonary/Chest: Effort normal. No respiratory distress. She has wheezes (throughout ). She has no rales. She exhibits no tenderness.  Neurological: She is alert and oriented to person, place, and time.  Skin: Skin is warm and dry. No rash noted. She is not diaphoretic. No erythema. No pallor.  Psychiatric: She has a normal mood and affect. Her behavior is normal. Judgment and thought content normal.  Nursing note and vitals reviewed.     Assessment & Plan:  1. Mild intermittent asthma with exacerbation - predniSONE (DELTASONE) 10 MG tablet; 40 mg x 3 days, 20 mg x 3 days, 10 mg x 3 days  Dispense: 21 tablet; Refill: 0 - ipratropium-albuterol (DUONEB) 0.5-2.5 (3) MG/3ML nebulizer solution 3 mL; Take 3 mLs by nebulization once.  - wheezing had resolved after duoneb. Patient endorses less congestion in her  chest and being able to breath easier   Dorothyann Peng, NP

## 2017-03-05 ENCOUNTER — Ambulatory Visit (INDEPENDENT_AMBULATORY_CARE_PROVIDER_SITE_OTHER): Payer: BLUE CROSS/BLUE SHIELD | Admitting: Internal Medicine

## 2017-03-05 ENCOUNTER — Encounter: Payer: Self-pay | Admitting: Internal Medicine

## 2017-03-05 VITALS — BP 122/70 | HR 78 | Temp 98.0°F | Ht 63.0 in | Wt 224.0 lb

## 2017-03-05 DIAGNOSIS — E119 Type 2 diabetes mellitus without complications: Secondary | ICD-10-CM | POA: Diagnosis not present

## 2017-03-05 DIAGNOSIS — J4521 Mild intermittent asthma with (acute) exacerbation: Secondary | ICD-10-CM | POA: Diagnosis not present

## 2017-03-05 LAB — POCT GLYCOSYLATED HEMOGLOBIN (HGB A1C): Hemoglobin A1C: 6.9

## 2017-03-05 MED ORDER — PREDNISONE 20 MG PO TABS: 20.0000 mg | ORAL_TABLET | Freq: Two times a day (BID) | ORAL | 0 refills | Status: DC

## 2017-03-05 MED ORDER — FLUTICASONE FUROATE-VILANTEROL 200-25 MCG/INH IN AEPB: 1.0000 | INHALATION_SPRAY | Freq: Every day | RESPIRATORY_TRACT | Status: DC

## 2017-03-05 NOTE — Progress Notes (Signed)
Subjective:    Patient ID: Kayla [name], female    DOB: 1961/05/15, 56 y.o.   MRN: [id number]  HPI  56 year old patient who is seen today for follow-up of asthma.  She has been on rescue albuterol for some time.  She was seen with an acute exacerbation on June 8.  She did well with the prednisone Dosepak but has relapsed since completion.  She complains of increasing chest tightness, faint wheezing and shortness of breath.  There is been a mild nonproductive cough  She continues to require frequent albuterol use.  She has a history of diabetes.  No recent hemoglobin A1c  Lab Results  Component Value Date   HGBA1C 7.5 (H) 10/05/2016   Past Medical History:  Diagnosis Date  . Carpal tunnel syndrome of right wrist 10/2012  . Complication of anesthesia    hard to wake up after surg. 06/04/2012 and CO2 went up to 78  . Depressive disorder, not elsewhere classified   . Exertional dyspnea    occasionally  . 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 Headache(784.0)    due to allergies  . Heart murmur    states no problems  . Hyperlipidemia   . Mitral valve prolapse   . OSA on CPAP   . Osteoarthritis    knees  . Overactive bladder   . PTSD (post-traumatic stress disorder)   . Type II diabetes mellitus ([facility])    NIDDM     Social History   Social History  . Marital status: Married    Spouse name: N/A  . Number of children: 2  . Years of education: N/A   Occupational History  . Curator    Social History Main Topics  . Smoking status: Never Smoker  . Smokeless tobacco: Never Used  . Alcohol use No  . Drug use: No  . Sexual activity: Yes   Other Topics Concern  . Not on file   Social History Narrative  . No narrative on file    Past Surgical History:  Procedure Laterality Date  . BREAST BIOPSY Left 04/25/2001  . CARPAL TUNNEL RELEASE Right 10/24/2012   Procedure: CARPAL TUNNEL RELEASE;   Surgeon: Ninetta Lights, MD;  Location: [place];  Service: Orthopedics;  Laterality: Right;  RIGHT CARPAL TUNNEL RELEASE  . KNEE ARTHROSCOPY Right 04/01/2008  . KNEE ARTHROSCOPY W/ MENISCECTOMY Left 08/16/2011   partial medial  . LASIK     bilateral  . PUBOVAGINAL SLING  08/27/2001   cysto; suprapubic tube placement  . SHOULDER ARTHROSCOPY W/ ROTATOR CUFF REPAIR Left 2004  . TOTAL ABDOMINAL HYSTERECTOMY  08/27/2001  . TOTAL KNEE ARTHROPLASTY  02/06/2012   Procedure: TOTAL KNEE ARTHROPLASTY;  Surgeon: Ninetta Lights, MD;  Location: [place];  Service: Orthopedics;  Laterality: Right;  total knee right side  . TOTAL KNEE ARTHROPLASTY  06/04/2012   Procedure: TOTAL KNEE ARTHROPLASTY;  Surgeon: Ninetta Lights, MD;  Location: [place];  Service: Orthopedics;  Laterality: Left;  Marland Kitchen VAGINAL HYSTERECTOMY  1980's    Family History  Problem Relation Age of Onset  . Heart disease Father   . COPD Mother   . Heart disease Mother   . Heart attack Brother   . Colon cancer Neg Hx     Allergies  Allergen Reactions  . Morphine And Related Shortness Of Breath and Itching    Current Outpatient Prescriptions on File  Prior to Visit  Medication Sig Dispense Refill  . albuterol (PROVENTIL HFA) 108 (90 Base) MCG/ACT inhaler Inhale 2 puffs into the lungs every 6 (six) hours as needed for wheezing or shortness of breath.    Marland Kitchen atorvastatin (LIPITOR) 20 MG tablet TAKE ONE TABLET BY MOUTH ONCE DAILY 30 tablet 5  . empagliflozin (JARDIANCE) 25 MG TABS tablet Take 25 mg by mouth daily. 30 tablet 6  . estradiol (ESTRACE) 0.5 MG tablet Take 1 mg by mouth daily.     . Loperamide HCl (IMODIUM PO) Take 2 tablets by mouth 2 (two) times daily as needed.     Marland Kitchen LORazepam (ATIVAN) 0.5 MG tablet TAKE ONE TABLET BY MOUTH EVERY 8 HOURS AS NEEDED FOR ANXIETY 60 tablet 5  . meloxicam (MOBIC) 15 MG tablet TAKE ONE TABLET BY MOUTH ONCE DAILY 90 tablet 3  . metFORMIN (GLUCOPHAGE-XR) 500 MG 24 hr tablet 2 tablets  twice daily 180 tablet 6  . oxybutynin (DITROPAN XL) 15 MG 24 hr tablet TAKE ONE TABLET BY MOUTH ONCE DAILY 90 tablet 1  . predniSONE (DELTASONE) 10 MG tablet 40 mg x 3 days, 20 mg x 3 days, 10 mg x 3 days 21 tablet 0  . sertraline (ZOLOFT) 100 MG tablet TAKE ONE TABLET BY MOUTH ONCE DAILY 90 tablet 1  . testosterone cypionate (DEPOTESTOTERONE CYPIONATE) 100 MG/ML injection Inject 100 mg into the muscle every 28 (twenty-eight) days. For IM use only     No current facility-administered medications on file prior to visit.     BP 122/70 (BP Location: Left Arm, Patient Position: Sitting, Cuff Size: Normal)   Pulse 78   Temp 98 F (36.7 C) (Oral)   Ht 5\' 3"  (1.6 m)   Wt 224 lb (101.6 kg)   SpO2 97%   BMI 39.68 kg/m     Review of Systems  Constitutional: Positive for activity change.  Respiratory: Positive for cough, shortness of breath and wheezing.        Objective:   Physical Exam  Constitutional: She is oriented to person, place, and time. She appears well-developed and well-nourished.  HENT:  Head: Normocephalic.  Right Ear: External ear normal.  Left Ear: External ear normal.  Mouth/Throat: Oropharynx is clear and moist.  Eyes: Conjunctivae and EOM are normal. Pupils are equal, round, and reactive to light.  Neck: Normal range of motion. Neck supple. No thyromegaly present.  Cardiovascular: Normal rate, regular rhythm, normal heart sounds and intact distal pulses.   Pulmonary/Chest: Effort normal. No respiratory distress. She has wheezes.  Abdominal: Soft. Bowel sounds are normal. She exhibits no mass. There is no tenderness.  Musculoskeletal: Normal range of motion.  Lymphadenopathy:    She has no cervical adenopathy.  Neurological: She is alert and oriented to person, place, and time.  Skin: Skin is warm and dry. No rash noted.  Psychiatric: She has a normal mood and affect. Her behavior is normal.          Assessment & Plan:    asthma.  Will treat with a  submaximal brief course of oral prednisone to stabilize.  Will place on maintenance medication of [place] Diabetes mellitus.  Will review a hemoglobin A1c.  Continue present regimen OSA.  Follow-up pulmonary medicine Essential hypertension  Return in 3 months for follow-up  Nyoka Cowden

## 2017-03-05 NOTE — Patient Instructions (Signed)
Limit your sodium (Salt) intake   Please check your hemoglobin A1c every 3 months  Pulmonary follow-up as scheduled  Please check your blood pressure on a regular basis.  If it is consistently greater than 150/90, please make an office appointment.

## 2017-03-19 ENCOUNTER — Other Ambulatory Visit: Payer: Self-pay | Admitting: Internal Medicine

## 2017-03-20 DIAGNOSIS — R6882 Decreased libido: Secondary | ICD-10-CM | POA: Diagnosis not present

## 2017-03-25 ENCOUNTER — Ambulatory Visit (INDEPENDENT_AMBULATORY_CARE_PROVIDER_SITE_OTHER): Payer: BLUE CROSS/BLUE SHIELD | Admitting: Internal Medicine

## 2017-03-25 ENCOUNTER — Encounter: Payer: Self-pay | Admitting: Internal Medicine

## 2017-03-25 VITALS — BP 122/80 | HR 76 | Temp 98.0°F | Ht 63.0 in | Wt 223.4 lb

## 2017-03-25 DIAGNOSIS — E119 Type 2 diabetes mellitus without complications: Secondary | ICD-10-CM

## 2017-03-25 DIAGNOSIS — G4733 Obstructive sleep apnea (adult) (pediatric): Secondary | ICD-10-CM

## 2017-03-25 DIAGNOSIS — E7800 Pure hypercholesterolemia, unspecified: Secondary | ICD-10-CM | POA: Diagnosis not present

## 2017-03-25 DIAGNOSIS — I10 Essential (primary) hypertension: Secondary | ICD-10-CM

## 2017-03-25 NOTE — Patient Instructions (Addendum)
WE NOW OFFER   [facility] FAST TRACK!!!  SAME DAY Appointments for ACUTE CARE  Such as: Sprains, Injuries, cuts, abrasions, rashes, muscle pain, joint pain, back pain Colds, flu, sore throats, headache, allergies, cough, fever  Ear pain, sinus and eye infections Abdominal pain, nausea, vomiting, diarrhea, upset stomach Animal/insect bites  3 Easy Ways to Schedule: Walk-In Scheduling Call in scheduling Mychart Sign-up: [email or web address]     Please check your hemoglobin A1c every 3 months  Please check your blood pressure on a regular basis.  If it is consistently greater than 150/90, please make an office appointment.  Limit your sodium (Salt) intake    It is important that you exercise regularly, at least 20 minutes 3 to 4 times per week.  If you develop chest pain or shortness of breath seek  medical attention.

## 2017-03-25 NOTE — Progress Notes (Signed)
Subjective:    Patient ID: Kayla [name], female    DOB: [birth year], 56 y.o.   MRN: [id number]  HPI 56 year old patient who has essential hypertension and asthma. She has done quite well since starting maintenance therapy with Breo. She has a history of diabetes and dyslipidemia.  Lab Results  Component Value Date   HGBA1C 6.9 03/05/2017    Past Medical History:  Diagnosis Date  . Carpal tunnel syndrome of right wrist 10/2012  . Complication of anesthesia    hard to wake up after surg. 06/04/2012 and CO2 went up to 78  . Depressive disorder, not elsewhere classified   . Exertional dyspnea    occasionally  . 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 Headache(784.0)    due to allergies  . Heart murmur    states no problems  . Hyperlipidemia   . Mitral valve prolapse   . OSA on CPAP   . Osteoarthritis    knees  . Overactive bladder   . PTSD (post-traumatic stress disorder)   . Type II diabetes mellitus (HCC)    NIDDM     Social History   Social History  . Marital status: Married    Spouse name: N/A  . Number of children: 2  . Years of education: N/A   Occupational History  . Curator    Social History Main Topics  . Smoking status: Never Smoker  . Smokeless tobacco: Never Used  . Alcohol use No  . Drug use: No  . Sexual activity: Yes   Other Topics Concern  . Not on file   Social History Narrative  . No narrative on file    Past Surgical History:  Procedure Laterality Date  . BREAST BIOPSY Left 04/25/2001  . CARPAL TUNNEL RELEASE Right 10/24/2012   Procedure: CARPAL TUNNEL RELEASE;  Surgeon: Ninetta Lights, MD;  Location: [place];  Service: Orthopedics;  Laterality: Right;  RIGHT CARPAL TUNNEL RELEASE  . KNEE ARTHROSCOPY Right 04/01/2008  . KNEE ARTHROSCOPY W/ MENISCECTOMY Left 08/16/2011   partial medial  . LASIK     bilateral  . PUBOVAGINAL SLING   08/27/2001   cysto; suprapubic tube placement  . SHOULDER ARTHROSCOPY W/ ROTATOR CUFF REPAIR Left 2004  . TOTAL ABDOMINAL HYSTERECTOMY  08/27/2001  . TOTAL KNEE ARTHROPLASTY  02/06/2012   Procedure: TOTAL KNEE ARTHROPLASTY;  Surgeon: Ninetta Lights, MD;  Location: [place];  Service: Orthopedics;  Laterality: Right;  total knee right side  . TOTAL KNEE ARTHROPLASTY  06/04/2012   Procedure: TOTAL KNEE ARTHROPLASTY;  Surgeon: Ninetta Lights, MD;  Location: [place];  Service: Orthopedics;  Laterality: Left;  Marland Kitchen VAGINAL HYSTERECTOMY  1980's    Family History  Problem Relation Age of Onset  . Heart disease Father   . COPD Mother   . Heart disease Mother   . Heart attack Brother   . Colon cancer Neg Hx     Allergies  Allergen Reactions  . Morphine And Related Shortness Of Breath and Itching    Current Outpatient Prescriptions on File Prior to Visit  Medication Sig Dispense Refill  . albuterol (PROVENTIL HFA) 108 (90 Base) MCG/ACT inhaler Inhale 2 puffs into the lungs every 6 (six) hours as needed for wheezing or shortness of breath.    Marland Kitchen atorvastatin (LIPITOR) 20 MG tablet TAKE ONE TABLET BY MOUTH ONCE DAILY 30 tablet 5  . empagliflozin (JARDIANCE)  25 MG TABS tablet Take 25 mg by mouth daily. 30 tablet 6  . estradiol (ESTRACE) 0.5 MG tablet Take 1 mg by mouth daily.     . Loperamide HCl (IMODIUM PO) Take 2 tablets by mouth 2 (two) times daily as needed.     Marland Kitchen LORazepam (ATIVAN) 0.5 MG tablet TAKE ONE TABLET BY MOUTH EVERY 8 HOURS AS NEEDED FOR ANXIETY 60 tablet 5  . meloxicam (MOBIC) 15 MG tablet TAKE ONE TABLET BY MOUTH ONCE DAILY 90 tablet 3  . metFORMIN (GLUCOPHAGE-XR) 500 MG 24 hr tablet 2 tablets twice daily 180 tablet 6  . metFORMIN (GLUCOPHAGE-XR) 500 MG 24 hr tablet TAKE TWO TABLETS BY MOUTH ONCE DAILY WITH BREAKFAST 180 tablet 3  . oxybutynin (DITROPAN XL) 15 MG 24 hr tablet TAKE ONE TABLET BY MOUTH ONCE DAILY 90 tablet 1  . sertraline (ZOLOFT) 100 MG tablet TAKE ONE TABLET BY MOUTH  ONCE DAILY 90 tablet 1  . testosterone cypionate (DEPOTESTOTERONE CYPIONATE) 100 MG/ML injection Inject 100 mg into the muscle every 28 (twenty-eight) days. For IM use only     Current Facility-Administered Medications on File Prior to Visit  Medication Dose Route Frequency Provider Last Rate Last Dose  . fluticasone furoate-vilanterol (BREO ELLIPTA) 200-25 MCG/INH 1 puff  1 puff Inhalation Daily Marletta Lor, MD        BP 122/80 (BP Location: Left Arm, Patient Position: Sitting, Cuff Size: Normal)   Pulse 76   Temp 98 F (36.7 C) (Oral)   Ht 5\' 3"  (1.6 m)   Wt 223 lb 6.4 oz (101.3 kg)   SpO2 97%   BMI 39.57 kg/m      Review of Systems  Constitutional: Negative.   HENT: Negative for congestion, dental problem, hearing loss, rhinorrhea, sinus pressure, sore throat and tinnitus.   Eyes: Negative for pain, discharge and visual disturbance.  Respiratory: Negative for cough and shortness of breath.   Cardiovascular: Negative for chest pain, palpitations and leg swelling.  Gastrointestinal: Negative for abdominal distention, abdominal pain, blood in stool, constipation, diarrhea, nausea and vomiting.  Genitourinary: Negative for difficulty urinating, dysuria, flank pain, frequency, hematuria, pelvic pain, urgency, vaginal bleeding, vaginal discharge and vaginal pain.  Musculoskeletal: Negative for arthralgias, gait problem and joint swelling.  Skin: Negative for rash.  Neurological: Negative for dizziness, syncope, speech difficulty, weakness, numbness and headaches.  Hematological: Negative for adenopathy.  Psychiatric/Behavioral: Negative for agitation, behavioral problems and dysphoric mood. The patient is not nervous/anxious.        Objective:   Physical Exam  Constitutional: She is oriented to person, place, and time. She appears well-developed and well-nourished.  HENT:  Head: Normocephalic.  Right Ear: External ear normal.  Left Ear: External ear normal.    Mouth/Throat: Oropharynx is clear and moist.  Eyes: Pupils are equal, round, and reactive to light. Conjunctivae and EOM are normal.  Neck: Normal range of motion. Neck supple. No thyromegaly present.  Cardiovascular: Normal rate, regular rhythm, normal heart sounds and intact distal pulses.   Pulmonary/Chest: Effort normal and breath sounds normal. She has no wheezes.  Lungs are clear  Abdominal: Soft. Bowel sounds are normal. She exhibits no mass. There is no tenderness.  Musculoskeletal: Normal range of motion.  Lymphadenopathy:    She has no cervical adenopathy.  Neurological: She is alert and oriented to person, place, and time.  Skin: Skin is warm and dry. No rash noted.  Psychiatric: She has a normal mood and affect. Her behavior is normal.  Assessment & Plan:   Asthma.  Clinically improved on maintenance medications.  We'll continue no recent albuterol use Essential hypertension, stable Diabetes mellitus.  Controlled   Follow-up 3 months  Nyoka Cowden

## 2017-04-11 ENCOUNTER — Telehealth: Payer: Self-pay | Admitting: Internal Medicine

## 2017-04-11 NOTE — Telephone Encounter (Signed)
Ok to send Rx to pharmacy? Please advise.

## 2017-04-11 NOTE — Telephone Encounter (Signed)
Pt states Dr Raliegh Ip was going to send in [facility] for her to start. He gave her samples and this is working.  [place], [place] [id number]

## 2017-04-12 ENCOUNTER — Telehealth: Payer: Self-pay | Admitting: Internal Medicine

## 2017-04-12 MED ORDER — FLUTICASONE FUROATE-VILANTEROL 100-25 MCG/INH IN AEPB: 1.0000 | INHALATION_SPRAY | Freq: Every day | RESPIRATORY_TRACT | 3 refills | Status: DC

## 2017-04-12 NOTE — Telephone Encounter (Signed)
New Rx for medication was sent to pharmacy.

## 2017-04-12 NOTE — Telephone Encounter (Signed)
Okay for refill refill times 3 Directions 1 puff daily

## 2017-04-12 NOTE — Telephone Encounter (Signed)
Error/njr °

## 2017-05-07 ENCOUNTER — Encounter: Payer: Self-pay | Admitting: Internal Medicine

## 2017-05-08 ENCOUNTER — Encounter: Payer: Self-pay | Admitting: Internal Medicine

## 2017-05-08 ENCOUNTER — Ambulatory Visit (INDEPENDENT_AMBULATORY_CARE_PROVIDER_SITE_OTHER): Payer: BLUE CROSS/BLUE SHIELD | Admitting: Internal Medicine

## 2017-05-08 DIAGNOSIS — G4733 Obstructive sleep apnea (adult) (pediatric): Secondary | ICD-10-CM

## 2017-05-08 DIAGNOSIS — Z23 Encounter for immunization: Secondary | ICD-10-CM

## 2017-05-08 DIAGNOSIS — J4520 Mild intermittent asthma, uncomplicated: Secondary | ICD-10-CM

## 2017-05-08 DIAGNOSIS — G2581 Restless legs syndrome: Secondary | ICD-10-CM

## 2017-05-08 MED ORDER — ROPINIROLE HCL 0.25 MG PO TABS: ORAL_TABLET | ORAL | 5 refills | Status: DC

## 2017-05-08 NOTE — Patient Instructions (Signed)
CPAP works well for you. Our goal is to use it all night, every night, and whenever you are sleeping.                   Continue CPAP auto 5-15, mask of choice, humidifier, supplies, AirView   Dx OSA  Script sent for Requip to help Restless Legs  Please call if we can help

## 2017-05-08 NOTE — Progress Notes (Signed)
Subjective:    Patient ID: Kayla [name], female    DOB: October 22, 1960, 56 y.o.   MRN: [id number]  HPI female never smoker followed for OSA/Insomnia, Asthma, allergic rhinitis complicated by HBP, depression, DM NPSG 01/30/11-AHI 19.9/hour, desaturation to 88%, CPAP titration to 9, body weight 230 pounds FENO 01/04/17- 42 H Office Spirometry 01/04/17-mild restriction of exhaled volume. FVC 2.51/78%, FEV1 2.06/82%, ratio 0.82, FEF25-75% 2.27/95% -------------------------------------------------------------------------------------------  01/04/17- female never smoker followed for OSA/Insomnia, complicated by HBP, depression, DM FOLLOWS FOR DME [facility] DL ATTACHED PATIENT NEEDS A NEW CPAP MACHINE AND SUPPLIES PATIENT WANTS HER OXYGEN TANK PICKED UP  CPAP 9/[facility]> auto 5-15 today Oxygen was apparently by Dr Ronne Binning at [facility]) early March, 2018 on discharge after hosp for acute asthmatic bronchitis she blames on pollen. She has not been using it. Finished prednisone 2 weeks ago.  She has only an albuterol rescue inhaler which she has used only once since hospital discharge. Gives history of seasonal allergic rhinitis which she says is not bad this spring. She continues to use CPAP all night every night and feels she benefits with better sleep quality. FENO 01/04/17- 42 H Office Spirometry 01/04/17-mild restriction of exhaled volume. FVC 2.51/78%, FEV1 2.06/82%, ratio 0.82, FEF25-75% 2.27/95%  829/18- 56 yo female never smoker followed for OSA/Insomnia,Asthma, complicated by HBP, depression, DM2 CPAP auto 5-15/ [facility] FOLLOWS FOR: DME: [facility]. Pt wears CPAP nightly and pressure works well for patient. DL attached and no new supplies needed at this time.  Download 57% compliance averaging 6 hours on nights used with AHI 2.2/hour. She says she likes CPAP. Reports having restless legs in the last month making sleep onset hard. No history of anemia and no family history of Parkinson's  disease but she does have lumbar disc problems. She was given a Breo maintenance inhaler and now rarely needs albuterol.  ROS-see HPI            + = pos Constitutional:   No-   weight loss, night sweats, fevers, chills, fatigue, lassitude. HEENT:   No-  headaches, difficulty swallowing, tooth/dental problems, sore throat,       No-  sneezing, itching, ear ache, +nasal congestion, post nasal drip,  CV:  No-   chest pain, orthopnea, PND, swelling in lower extremities, anasarca,  dizziness, palpitations Resp:  No hemoptysis  Skin: No-   rash or lesions. GI:  No-   heartburn, indigestion, abdominal pain, nausea, vomiting,  GU: . MS:  No-   joint pain or swelling.  . Neuro-     nothing unusual Psych:  No- change in mood or affect. No depression or anxiety.  No memory loss.  OBJ- Physical Exam General- Alert, Oriented, Affect-appropriate, Distress- none acute, + overweight Skin- rash-none, lesions- none, excoriation- none Lymphadenopathy- none Head- atraumatic            Eyes- Gross vision intact, PERRLA, conjunctivae and secretions clear            Ears- Hearing, canals-normal            Nose- Clear, no-Septal dev, mucus, polyps, erosion, perforation             Throat- Mallampati III , mucosa clear , drainage- none, tonsils- atrophic Neck- flexible , trachea midline, no stridor , thyroid nl, carotid no bruit Chest - symmetrical excursion , unlabored           Heart/CV- RRR , no murmur , no gallop  , no rub, nl s1 s2                           -  JVD- none , edema- none, stasis changes- none, varices- none           Lung- clear to P&A, wheeze- none, cough- none , dullness-none, rub- none           Chest wall-  Abd-  Br/ Gen/ Rectal- Not done, not indicated Extrem- cyanosis- none, clubbing, none, atrophy- none, strength- nl Neuro- grossly intact to observation

## 2017-05-12 DIAGNOSIS — J4520 Mild intermittent asthma, uncomplicated: Secondary | ICD-10-CM | POA: Insufficient documentation

## 2017-05-12 DIAGNOSIS — G2581 Restless legs syndrome: Secondary | ICD-10-CM | POA: Insufficient documentation

## 2017-05-12 NOTE — Assessment & Plan Note (Signed)
She gives a pretty convincing description. The most obvious risk factor would be her degenerative disc disease. Plan-add Requip with discussion.

## 2017-05-12 NOTE — Assessment & Plan Note (Signed)
We reviewed her download report had discussed compliance goals. She is going to try to use it more consistently. She does benefit and can continue auto 5-15.

## 2017-05-12 NOTE — Assessment & Plan Note (Signed)
Breo seems to have helped. Her PCP started her on this. Hopefully it will keep her stable through the season changes. Plan-flu shot

## 2017-05-16 ENCOUNTER — Telehealth: Payer: Self-pay | Admitting: Internal Medicine

## 2017-05-16 NOTE — Telephone Encounter (Signed)
Called and spoke with Corene Cornea from [facility]---he will get this over to the dream team and they will contact the pt about her supplies.

## 2017-05-21 DIAGNOSIS — E11319 Type 2 diabetes mellitus with unspecified diabetic retinopathy without macular edema: Secondary | ICD-10-CM | POA: Diagnosis not present

## 2017-05-21 DIAGNOSIS — H524 Presbyopia: Secondary | ICD-10-CM | POA: Diagnosis not present

## 2017-05-30 ENCOUNTER — Encounter: Payer: Self-pay | Admitting: Internal Medicine

## 2017-06-05 ENCOUNTER — Other Ambulatory Visit: Payer: Self-pay | Admitting: Internal Medicine

## 2017-06-13 ENCOUNTER — Ambulatory Visit (INDEPENDENT_AMBULATORY_CARE_PROVIDER_SITE_OTHER): Payer: BLUE CROSS/BLUE SHIELD | Admitting: Internal Medicine

## 2017-06-13 ENCOUNTER — Encounter: Payer: Self-pay | Admitting: Internal Medicine

## 2017-06-13 VITALS — BP 146/98 | HR 74 | Temp 98.5°F | Wt 221.0 lb

## 2017-06-13 DIAGNOSIS — I10 Essential (primary) hypertension: Secondary | ICD-10-CM

## 2017-06-13 DIAGNOSIS — E119 Type 2 diabetes mellitus without complications: Secondary | ICD-10-CM

## 2017-06-13 LAB — POCT GLYCOSYLATED HEMOGLOBIN (HGB A1C): Hemoglobin A1C: 6.6

## 2017-06-13 MED ORDER — FLUCONAZOLE 150 MG PO TABS
150.0000 mg | ORAL_TABLET | Freq: Once | ORAL | 0 refills | Status: AC
Start: 2017-06-13 — End: 2017-06-13

## 2017-06-13 NOTE — Addendum Note (Signed)
Addended by: Wyvonne Lenz on: 06/13/2017 11:32 AM   Modules accepted: Orders

## 2017-06-13 NOTE — Progress Notes (Signed)
Subjective:    Patient ID: Kayla [name], female    DOB: May 08, 1961, 56 y.o.   MRN: [id number]  HPI  Lab Results  Component Value Date   HGBA1C 6.9 03/05/2017   56 year old patient who is followed by pulmonary/allergy medicine with a history of OSA and mild intermittent asthma. She has type 2 diabetes She presents with a one-week history of increasing sore throat, cough, congestion, and general sense of unwellness.  There is been no fever, wheezing or productive cough.  She does complain of some sinus congestion. She was seen by OB/GYN earlier with a fungal dermatitis which has not quite resolved.  She is requesting a refill of fluconazole.  Past Medical History:  Diagnosis Date  . Carpal tunnel syndrome of right wrist 10/2012  . Complication of anesthesia    hard to wake up after surg. 06/04/2012 and CO2 went up to 78  . Depressive disorder, not elsewhere classified   . Exertional dyspnea    occasionally  . 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 Headache(784.0)    due to allergies  . Heart murmur    states no problems  . Hyperlipidemia   . Mitral valve prolapse   . OSA on CPAP   . Osteoarthritis    knees  . Overactive bladder   . PTSD (post-traumatic stress disorder)   . Type II diabetes mellitus (HCC)    NIDDM     Social History   Social History  . Marital status: Married    Spouse name: N/A  . Number of children: 2  . Years of education: N/A   Occupational History  . Curator    Social History Main Topics  . Smoking status: Never Smoker  . Smokeless tobacco: Never Used  . Alcohol use No  . Drug use: No  . Sexual activity: Yes   Other Topics Concern  . Not on file   Social History Narrative  . No narrative on file    Past Surgical History:  Procedure Laterality Date  . BREAST BIOPSY Left 04/25/2001  . CARPAL TUNNEL RELEASE Right 10/24/2012   Procedure: CARPAL TUNNEL  RELEASE;  Surgeon: Ninetta Lights, MD;  Location: [place];  Service: Orthopedics;  Laterality: Right;  RIGHT CARPAL TUNNEL RELEASE  . KNEE ARTHROSCOPY Right 04/01/2008  . KNEE ARTHROSCOPY W/ MENISCECTOMY Left 08/16/2011   partial medial  . LASIK     bilateral  . PUBOVAGINAL SLING  08/27/2001   cysto; suprapubic tube placement  . SHOULDER ARTHROSCOPY W/ ROTATOR CUFF REPAIR Left 2004  . TOTAL ABDOMINAL HYSTERECTOMY  08/27/2001  . TOTAL KNEE ARTHROPLASTY  02/06/2012   Procedure: TOTAL KNEE ARTHROPLASTY;  Surgeon: Ninetta Lights, MD;  Location: [place];  Service: Orthopedics;  Laterality: Right;  total knee right side  . TOTAL KNEE ARTHROPLASTY  06/04/2012   Procedure: TOTAL KNEE ARTHROPLASTY;  Surgeon: Ninetta Lights, MD;  Location: [place];  Service: Orthopedics;  Laterality: Left;  Marland Kitchen VAGINAL HYSTERECTOMY  1980's    Family History  Problem Relation Age of Onset  . Heart disease Father   . COPD Mother   . Heart disease Mother   . Heart attack Brother   . Colon cancer Neg Hx     Allergies  Allergen Reactions  . Morphine And Related Shortness Of Breath and Itching    Current Outpatient Prescriptions on File Prior to Visit  Medication Sig  Dispense Refill  . albuterol (PROVENTIL HFA) 108 (90 Base) MCG/ACT inhaler Inhale 2 puffs into the lungs every 6 (six) hours as needed for wheezing or shortness of breath.    Marland Kitchen atorvastatin (LIPITOR) 20 MG tablet TAKE ONE TABLET BY MOUTH ONCE DAILY 30 tablet 5  . estradiol (ESTRACE) 0.5 MG tablet Take 1 mg by mouth daily.     . fluticasone furoate-vilanterol (BREO ELLIPTA) 100-25 MCG/INH AEPB Inhale 1 puff into the lungs daily. 60 each 3  . JARDIANCE 25 MG TABS tablet TAKE 1 TABLET BY MOUTH ONCE DAILY 30 tablet 6  . Loperamide HCl (IMODIUM PO) Take 2 tablets by mouth 2 (two) times daily as needed.     Marland Kitchen LORazepam (ATIVAN) 0.5 MG tablet TAKE ONE TABLET BY MOUTH EVERY 8 HOURS AS NEEDED FOR ANXIETY 60 tablet 5  . meloxicam (MOBIC) 15 MG  tablet TAKE ONE TABLET BY MOUTH ONCE DAILY 90 tablet 3  . metFORMIN (GLUCOPHAGE-XR) 500 MG 24 hr tablet TAKE TWO TABLETS BY MOUTH ONCE DAILY WITH BREAKFAST 180 tablet 3  . oxybutynin (DITROPAN XL) 15 MG 24 hr tablet TAKE ONE TABLET BY MOUTH ONCE DAILY 90 tablet 1  . rOPINIRole (REQUIP) 0.25 MG tablet 1 or 2 before bed for restless leg 30 tablet 5  . sertraline (ZOLOFT) 100 MG tablet TAKE ONE TABLET BY MOUTH ONCE DAILY 90 tablet 1  . testosterone cypionate (DEPOTESTOTERONE CYPIONATE) 100 MG/ML injection Inject 100 mg into the muscle every 28 (twenty-eight) days. For IM use only     No current facility-administered medications on file prior to visit.     BP (!) 146/98 (BP Location: Left Arm, Patient Position: Sitting, Cuff Size: Normal)   Pulse 74   Temp 98.5 F (36.9 C) (Oral)   Wt 221 lb (100.2 kg)   SpO2 98%   BMI 39.15 kg/m    Review of Systems  Constitutional: Positive for activity change, appetite change and fatigue.  HENT: Positive for congestion, sinus pressure and sore throat. Negative for dental problem, hearing loss, rhinorrhea and tinnitus.   Eyes: Negative for pain, discharge and visual disturbance.  Respiratory: Positive for cough. Negative for shortness of breath and wheezing.   Cardiovascular: Negative for chest pain, palpitations and leg swelling.  Gastrointestinal: Negative for abdominal distention, abdominal pain, blood in stool, constipation, diarrhea, nausea and vomiting.  Genitourinary: Negative for difficulty urinating, dysuria, flank pain, frequency, hematuria, pelvic pain, urgency, vaginal bleeding, vaginal discharge and vaginal pain.  Musculoskeletal: Negative for arthralgias, gait problem and joint swelling.  Skin: Negative for rash.  Neurological: Negative for dizziness, syncope, speech difficulty, weakness, numbness and headaches.  Hematological: Negative for adenopathy.  Psychiatric/Behavioral: Negative for agitation, behavioral problems and dysphoric mood.  The patient is not nervous/anxious.        Objective:   Physical Exam  Constitutional: She is oriented to person, place, and time. She appears well-developed and well-nourished.  HENT:  Head: Normocephalic.  Right Ear: External ear normal.  Left Ear: External ear normal.  Mouth/Throat: Oropharynx is clear and moist.  Eyes: Pupils are equal, round, and reactive to light. Conjunctivae and EOM are normal.  Neck: Normal range of motion. Neck supple. No thyromegaly present.  Cardiovascular: Normal rate, regular rhythm, normal heart sounds and intact distal pulses.   Pulmonary/Chest: Effort normal and breath sounds normal.  Abdominal: Soft. Bowel sounds are normal. She exhibits no mass. There is no tenderness.  Musculoskeletal: Normal range of motion.  Lymphadenopathy:    She has no cervical  adenopathy.  Neurological: She is alert and oriented to person, place, and time.  Skin: Skin is warm and dry. No rash noted.  Psychiatric: She has a normal mood and affect. Her behavior is normal.          Assessment & Plan:   Mitral URI with cough History of stable mild asthma Diabetes mellitus.  Will review a hemoglobin A1c Essential hypertension.  Blood pressure elevated today but generally has been in a normal range.  No change in regimen.  Will monitor home blood pressures   Estreya Clay Pilar Plate

## 2017-06-13 NOTE — Patient Instructions (Addendum)

## 2017-06-24 ENCOUNTER — Ambulatory Visit: Payer: BLUE CROSS/BLUE SHIELD | Admitting: Internal Medicine

## 2017-07-06 ENCOUNTER — Other Ambulatory Visit: Payer: Self-pay | Admitting: Internal Medicine

## 2017-08-17 ENCOUNTER — Other Ambulatory Visit: Payer: Self-pay | Admitting: Internal Medicine

## 2017-09-09 ENCOUNTER — Encounter: Payer: Self-pay | Admitting: Internal Medicine

## 2017-09-09 ENCOUNTER — Ambulatory Visit (INDEPENDENT_AMBULATORY_CARE_PROVIDER_SITE_OTHER): Payer: BLUE CROSS/BLUE SHIELD | Admitting: Internal Medicine

## 2017-09-09 DIAGNOSIS — G4733 Obstructive sleep apnea (adult) (pediatric): Secondary | ICD-10-CM | POA: Diagnosis not present

## 2017-09-09 DIAGNOSIS — J4520 Mild intermittent asthma, uncomplicated: Secondary | ICD-10-CM | POA: Diagnosis not present

## 2017-09-09 NOTE — Patient Instructions (Signed)
We can continue CPAP auto 5-15, mask of choice, humidifier, supplies, AirView  We can continue present asthma meds- please call if refills needed

## 2017-09-09 NOTE — Progress Notes (Signed)
Subjective:    Patient ID: Kayla [name], female    DOB: March 07, 1961, 56 y.o.   MRN: [id number]  HPI female never smoker followed for OSA/Insomnia, Asthma, allergic rhinitis complicated by HBP, depression, DM NPSG 01/30/11-AHI 19.9/hour, desaturation to 88%, CPAP titration to 9, body weight 230 pounds FENO 01/04/17- 42 H Office Spirometry 01/04/17-mild restriction of exhaled volume. FVC 2.51/78%, FEV1 2.06/82%, ratio 0.82, FEF25-75% 2.27/95% -------------------------------------------------------------------------------------------  829/18- 56 yo female never smoker followed for OSA/Insomnia,Asthma, complicated by HBP, depression, DM2 CPAP auto 5-15/ [facility] FOLLOWS FOR: DME: [facility]. Pt wears CPAP nightly and pressure works well for patient. DL attached and no new supplies needed at this time.  Download 57% compliance averaging 6 hours on nights used with AHI 2.2/hour. She says she likes CPAP. Reports having restless legs in the last month making sleep onset hard. No history of anemia and no family history of Parkinson's disease but she does have lumbar disc problems. She was given a Breo maintenance inhaler and now rarely needs albuterol.  09/09/17- 56 yo female never smoker followed for OSA/Insomnia,Asthma, complicated by HBP, depression, DM2 CPAP auto 5-15/ [facility] ----OSA; DME: [facility]. Pt wears CPAP nightly and DL attached.  She sleeps much better with CPAP and uses it every night except for a series of power outages recently which impact the download results.  70% compliance, AHI 2.1/hour.  Pressure is comfortable.  No concerns. Asthma control has done very well continuing Breo 100.  Rare need for rescue inhaler with no sleep disturbance.  ROS-see HPI            + = pos Constitutional:   No-   weight loss, night sweats, fevers, chills, fatigue, lassitude. HEENT:   No-  headaches, difficulty swallowing, tooth/dental problems, sore throat,       No-  sneezing, itching, ear ache, +nasal  congestion, post nasal drip,  CV:  No-   chest pain, orthopnea, PND, swelling in lower extremities, anasarca,  dizziness, palpitations Resp:  No hemoptysis  Skin: No-   rash or lesions. GI:  No-   heartburn, indigestion, abdominal pain, nausea, vomiting,  GU: . MS:  No-   joint pain or swelling.  . Neuro-     nothing unusual Psych:  No- change in mood or affect. No depression or anxiety.  No memory loss.  OBJ- Physical Exam General- Alert, Oriented, Affect-appropriate, Distress- none acute, + overweight Skin- rash-none, lesions- none, excoriation- none Lymphadenopathy- none Head- atraumatic            Eyes- Gross vision intact, PERRLA, conjunctivae and secretions clear            Ears- Hearing, canals-normal            Nose- Clear, no-Septal dev, mucus, polyps, erosion, perforation             Throat- Mallampati III , mucosa clear , drainage- none, tonsils- atrophic Neck- flexible , trachea midline, no stridor , thyroid nl, carotid no bruit Chest - symmetrical excursion , unlabored           Heart/CV- RRR , no murmur , no gallop  , no rub, nl s1 s2                           - JVD- none , edema- none, stasis changes- none, varices- none           Lung- clear to P&A, wheeze- none, cough- none , dullness-none, rub- none  Chest wall-  Abd-  Br/ Gen/ Rectal- Not done, not indicated Extrem- cyanosis- none, clubbing, none, atrophy- none, strength- nl Neuro- grossly intact to observation

## 2017-09-09 NOTE — Assessment & Plan Note (Signed)
Breo maintenance therapy has worked quite well.  Rare need for rescue inhaler.  We discussed available medications with no changes required.

## 2017-09-09 NOTE — Assessment & Plan Note (Signed)
She benefits from CPAP and demonstrates good compliance and control.  No concerns.  We did discuss problems she has had with power outages this fall. Plan-continue AutoPap 5-15

## 2017-09-18 ENCOUNTER — Ambulatory Visit: Payer: BLUE CROSS/BLUE SHIELD | Admitting: Internal Medicine

## 2017-09-23 ENCOUNTER — Other Ambulatory Visit: Payer: Self-pay | Admitting: Internal Medicine

## 2017-10-07 DIAGNOSIS — M7062 Trochanteric bursitis, left hip: Secondary | ICD-10-CM | POA: Diagnosis not present

## 2017-10-07 DIAGNOSIS — M545 Low back pain: Secondary | ICD-10-CM | POA: Diagnosis not present

## 2017-10-07 DIAGNOSIS — S63642D Sprain of metacarpophalangeal joint of left thumb, subsequent encounter: Secondary | ICD-10-CM | POA: Diagnosis not present

## 2017-10-30 ENCOUNTER — Encounter: Payer: Self-pay | Admitting: Internal Medicine

## 2017-10-30 ENCOUNTER — Ambulatory Visit (INDEPENDENT_AMBULATORY_CARE_PROVIDER_SITE_OTHER): Payer: Managed Care, Other (non HMO) | Admitting: Internal Medicine

## 2017-10-30 VITALS — BP 130/60 | HR 67 | Temp 98.2°F | Wt 221.0 lb

## 2017-10-30 DIAGNOSIS — E119 Type 2 diabetes mellitus without complications: Secondary | ICD-10-CM | POA: Diagnosis not present

## 2017-10-30 DIAGNOSIS — E7800 Pure hypercholesterolemia, unspecified: Secondary | ICD-10-CM | POA: Diagnosis not present

## 2017-10-30 DIAGNOSIS — J3089 Other allergic rhinitis: Secondary | ICD-10-CM | POA: Diagnosis not present

## 2017-10-30 DIAGNOSIS — I10 Essential (primary) hypertension: Secondary | ICD-10-CM

## 2017-10-30 DIAGNOSIS — J302 Other seasonal allergic rhinitis: Secondary | ICD-10-CM

## 2017-10-30 LAB — LIPID PANEL
CHOL/HDL RATIO: 3
Cholesterol: 158 mg/dL (ref 0–200)
HDL: 53.3 mg/dL (ref >39.00)
LDL Cholesterol: 77 mg/dL (ref 0–99)
NONHDL: 104.58
Triglycerides: 137 mg/dL (ref 0.0–149.0)
VLDL: 27.4 mg/dL (ref 0.0–40.0)

## 2017-10-30 LAB — COMPREHENSIVE METABOLIC PANEL
ALT: 22 U/L (ref 0–35)
AST: 20 U/L (ref 0–37)
Albumin: 4 g/dL (ref 3.5–5.2)
Alkaline Phosphatase: 79 U/L (ref 39–117)
BILIRUBIN TOTAL: 0.5 mg/dL (ref 0.2–1.2)
BUN: 10 mg/dL (ref 6–23)
CO2: 28 meq/L (ref 19–32)
CREATININE: 1.01 mg/dL (ref 0.40–1.20)
Calcium: 9.5 mg/dL (ref 8.4–10.5)
Chloride: 104 mEq/L (ref 96–112)
GFR: 59.66 mL/min — ABNORMAL LOW (ref >60.00)
GLUCOSE: 124 mg/dL — AB (ref 70–99)
Potassium: 4.3 mEq/L (ref 3.5–5.1)
SODIUM: 139 meq/L (ref 135–145)
Total Protein: 6.7 g/dL (ref 6.0–8.3)

## 2017-10-30 LAB — MICROALBUMIN / CREATININE URINE RATIO
Creatinine,U: 117 mg/dL
Microalb Creat Ratio: 0.6 mg/g (ref 0.0–30.0)
Microalb, Ur: <0.7 mg/dL (ref 0.0–1.9)

## 2017-10-30 LAB — TSH: TSH: 3.55 u[IU]/mL (ref 0.35–4.50)

## 2017-10-30 LAB — HEMOGLOBIN A1C: Hgb A1c MFr Bld: 7.3 % — ABNORMAL HIGH (ref 4.6–6.5)

## 2017-10-30 NOTE — Patient Instructions (Signed)
Limit your sodium (Salt) intake    It is important that you exercise regularly, at least 20 minutes 3 to 4 times per week.  If you develop chest pain or shortness of breath seek  medical attention.  You need to lose weight.  Consider a lower calorie diet and regular exercise.   Please check your hemoglobin A1c every 3-6  Months  Please see your eye doctor yearly to check for diabetic eye damage  

## 2017-10-30 NOTE — Progress Notes (Signed)
Subjective:    Patient ID: Kayla [name], female    DOB: Aug 07, 1961, 57 y.o.   MRN: [id number]  HPI  57 year old patient who is seen today for follow-up.  She has type 2 diabetes which has been controlled on oral medications.  She has essential hypertension and a history of OSA.  She has osteoarthritis and dyslipidemia.  She remains on statin therapy.  Doing quite well today.  Past Medical History:  Diagnosis Date  . Carpal tunnel syndrome of right wrist 10/2012  . Complication of anesthesia    hard to wake up after surg. 06/04/2012 and CO2 went up to 78  . Depressive disorder, not elsewhere classified   . Exertional dyspnea    occasionally  . 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 Headache(784.0)    due to allergies  . Heart murmur    states no problems  . Hyperlipidemia   . Mitral valve prolapse   . OSA on CPAP   . Osteoarthritis    knees  . Overactive bladder   . PTSD (post-traumatic stress disorder)   . Type II diabetes mellitus (HCC)    NIDDM     Social History   Socioeconomic History  . Marital status: Married    Spouse name: Not on file  . Number of children: 2  . Years of education: Not on file  . Highest education level: Not on file  Social Needs  . Financial resource strain: Not on file  . Food insecurity - worry: Not on file  . Food insecurity - inability: Not on file  . Transportation needs - medical: Not on file  . Transportation needs - non-medical: Not on file  Occupational History  . Occupation: Curator  Tobacco Use  . Smoking status: Never Smoker  . Smokeless tobacco: Never Used  Substance and Sexual Activity  . Alcohol use: No  . Drug use: No  . Sexual activity: Yes  Other Topics Concern  . Not on file  Social History Narrative  . Not on file    Past Surgical History:  Procedure Laterality Date  . BREAST BIOPSY Left 04/25/2001  . CARPAL TUNNEL RELEASE  Right 10/24/2012   Procedure: CARPAL TUNNEL RELEASE;  Surgeon: Ninetta Lights, MD;  Location: [facility];  Service: Orthopedics;  Laterality: Right;  RIGHT CARPAL TUNNEL RELEASE  . KNEE ARTHROSCOPY Right 04/01/2008  . KNEE ARTHROSCOPY W/ MENISCECTOMY Left 08/16/2011   partial medial  . LASIK     bilateral  . PUBOVAGINAL SLING  08/27/2001   cysto; suprapubic tube placement  . SHOULDER ARTHROSCOPY W/ ROTATOR CUFF REPAIR Left 2004  . TOTAL ABDOMINAL HYSTERECTOMY  08/27/2001  . TOTAL KNEE ARTHROPLASTY  02/06/2012   Procedure: TOTAL KNEE ARTHROPLASTY;  Surgeon: Ninetta Lights, MD;  Location: [facility];  Service: Orthopedics;  Laterality: Right;  total knee right side  . TOTAL KNEE ARTHROPLASTY  06/04/2012   Procedure: TOTAL KNEE ARTHROPLASTY;  Surgeon: Ninetta Lights, MD;  Location: [facility];  Service: Orthopedics;  Laterality: Left;  Marland Kitchen VAGINAL HYSTERECTOMY  1980's    Family History  Problem Relation Age of Onset  . Heart disease Father   . COPD Mother   . Heart disease Mother   . Heart attack Brother   . Colon cancer Neg Hx     Allergies  Allergen Reactions  . Morphine And Related Shortness Of Breath and Itching  Current Outpatient Medications on File Prior to Visit  Medication Sig Dispense Refill  . albuterol (PROVENTIL HFA) 108 (90 Base) MCG/ACT inhaler Inhale 2 puffs into the lungs every 6 (six) hours as needed for wheezing or shortness of breath.    Marland Kitchen atorvastatin (LIPITOR) 20 MG tablet TAKE ONE TABLET BY MOUTH ONCE DAILY 30 tablet 5  . estradiol (ESTRACE) 0.5 MG tablet Take 1 mg by mouth daily.     . fluticasone furoate-vilanterol (BREO ELLIPTA) 100-25 MCG/INH AEPB Inhale 1 puff into the lungs daily. 60 each 3  . JARDIANCE 25 MG TABS tablet TAKE 1 TABLET BY MOUTH ONCE DAILY 30 tablet 6  . Loperamide HCl (IMODIUM PO) Take 2 tablets by mouth 2 (two) times daily as needed.     Marland Kitchen LORazepam (ATIVAN) 0.5 MG tablet TAKE ONE TABLET BY MOUTH EVERY 8 HOURS AS NEEDED FOR ANXIETY  60 tablet 5  . meloxicam (MOBIC) 15 MG tablet TAKE ONE TABLET BY MOUTH ONCE DAILY 90 tablet 3  . metFORMIN (GLUCOPHAGE-XR) 500 MG 24 hr tablet TAKE TWO TABLETS BY MOUTH ONCE DAILY WITH BREAKFAST 180 tablet 3  . oxybutynin (DITROPAN XL) 15 MG 24 hr tablet TAKE 1 TABLET BY MOUTH ONCE DAILY 90 tablet 1  . rOPINIRole (REQUIP) 0.25 MG tablet 1 or 2 before bed for restless leg 30 tablet 5  . sertraline (ZOLOFT) 100 MG tablet TAKE 1 TABLET BY MOUTH ONCE DAILY 90 tablet 1  . testosterone cypionate (DEPOTESTOTERONE CYPIONATE) 100 MG/ML injection Inject 100 mg into the muscle every 28 (twenty-eight) days. For IM use only     No current facility-administered medications on file prior to visit.     BP 130/60 (BP Location: Left Arm, Patient Position: Sitting, Cuff Size: Normal)   Pulse 67   Temp 98.2 F (36.8 C) (Oral)   Wt 221 lb (100.2 kg)   SpO2 97%   BMI 39.15 kg/m     Review of Systems  Constitutional: Negative.   HENT: Negative for congestion, dental problem, hearing loss, rhinorrhea, sinus pressure, sore throat and tinnitus.   Eyes: Negative for pain, discharge and visual disturbance.  Respiratory: Negative for cough and shortness of breath.   Cardiovascular: Negative for chest pain, palpitations and leg swelling.  Gastrointestinal: Negative for abdominal distention, abdominal pain, blood in stool, constipation, diarrhea, nausea and vomiting.  Genitourinary: Negative for difficulty urinating, dysuria, flank pain, frequency, hematuria, pelvic pain, urgency, vaginal bleeding, vaginal discharge and vaginal pain.  Musculoskeletal: Negative for arthralgias, gait problem and joint swelling.  Skin: Negative for rash.  Neurological: Negative for dizziness, syncope, speech difficulty, weakness, numbness and headaches.  Hematological: Negative for adenopathy.  Psychiatric/Behavioral: Negative for agitation, behavioral problems and dysphoric mood. The patient is not nervous/anxious.          Objective:   Physical Exam  Constitutional: She is oriented to person, place, and time. She appears well-developed and well-nourished.  Blood pressure 130/60  HENT:  Head: Normocephalic.  Right Ear: External ear normal.  Left Ear: External ear normal.  Mouth/Throat: Oropharynx is clear and moist.  Eyes: Conjunctivae and EOM are normal. Pupils are equal, round, and reactive to light.  Neck: Normal range of motion. Neck supple. No thyromegaly present.  Cardiovascular: Normal rate, regular rhythm, normal heart sounds and intact distal pulses.  Pulmonary/Chest: Effort normal and breath sounds normal.  Abdominal: Soft. Bowel sounds are normal. She exhibits no mass. There is no tenderness.  Musculoskeletal: Normal range of motion.  Lymphadenopathy:    She  has no cervical adenopathy.  Neurological: She is alert and oriented to person, place, and time.  Skin: Skin is warm and dry. No rash noted.  Psychiatric: She has a normal mood and affect. Her behavior is normal.          Assessment & Plan:   Diabetes mellitus type 2.  Will review hemoglobin A1c.  Will check lipid profile and urine for microalbumin Essential hypertension Dyslipidemia will review a lipid profile Obesity weight loss encouraged  Follow-up 3-6 months  Sallie Maker Pilar Plate

## 2017-10-31 LAB — HEPATITIS C ANTIBODY
Hepatitis C Ab: NONREACTIVE
SIGNAL TO CUT-OFF: 0.01 (ref <1.00)

## 2017-11-04 ENCOUNTER — Other Ambulatory Visit: Payer: Self-pay | Admitting: Orthopedic Surgery

## 2017-11-04 DIAGNOSIS — S5332XA Traumatic rupture of left ulnar collateral ligament, initial encounter: Secondary | ICD-10-CM | POA: Diagnosis not present

## 2017-11-05 ENCOUNTER — Encounter (HOSPITAL_BASED_OUTPATIENT_CLINIC_OR_DEPARTMENT_OTHER): Payer: Self-pay | Admitting: *Deleted

## 2017-11-05 ENCOUNTER — Other Ambulatory Visit: Payer: Self-pay | Admitting: Internal Medicine

## 2017-11-05 NOTE — Progress Notes (Signed)
PT's chart reviewed by Dr Gifford Shave. No additional pre op testing needed prior to surgery 11/08/17.

## 2017-11-06 ENCOUNTER — Encounter (HOSPITAL_COMMUNITY)
Admission: RE | Admit: 2017-11-06 | Discharge: 2017-11-06 | Disposition: A | Discharge status: Home / Self Care | Payer: Managed Care, Other (non HMO) | Source: Ambulatory Visit | Attending: Orthopedic Surgery | Admitting: Orthopedic Surgery

## 2017-11-06 DIAGNOSIS — R9431 Abnormal electrocardiogram [ECG] [EKG]: Secondary | ICD-10-CM | POA: Diagnosis not present

## 2017-11-06 DIAGNOSIS — E119 Type 2 diabetes mellitus without complications: Secondary | ICD-10-CM | POA: Diagnosis not present

## 2017-11-08 ENCOUNTER — Other Ambulatory Visit: Payer: Self-pay

## 2017-11-08 ENCOUNTER — Ambulatory Visit (HOSPITAL_BASED_OUTPATIENT_CLINIC_OR_DEPARTMENT_OTHER): Payer: Managed Care, Other (non HMO) | Admitting: Anesthesiology

## 2017-11-08 ENCOUNTER — Encounter (HOSPITAL_BASED_OUTPATIENT_CLINIC_OR_DEPARTMENT_OTHER): Payer: Self-pay | Admitting: *Deleted

## 2017-11-08 ENCOUNTER — Ambulatory Visit (HOSPITAL_BASED_OUTPATIENT_CLINIC_OR_DEPARTMENT_OTHER)
Admission: RE | Admit: 2017-11-08 | Discharge: 2017-11-08 | Disposition: A | Discharge status: Home / Self Care | Payer: Managed Care, Other (non HMO) | Source: Ambulatory Visit | Attending: Orthopedic Surgery | Admitting: Orthopedic Surgery

## 2017-11-08 ENCOUNTER — Encounter (HOSPITAL_BASED_OUTPATIENT_CLINIC_OR_DEPARTMENT_OTHER)
Admission: RE | Disposition: A | Discharge status: Home / Self Care | Payer: Self-pay | Source: Ambulatory Visit | Attending: Orthopedic Surgery

## 2017-11-08 DIAGNOSIS — I341 Nonrheumatic mitral (valve) prolapse: Secondary | ICD-10-CM | POA: Insufficient documentation

## 2017-11-08 DIAGNOSIS — F4310 Post-traumatic stress disorder, unspecified: Secondary | ICD-10-CM | POA: Diagnosis not present

## 2017-11-08 DIAGNOSIS — Z6839 Body mass index (BMI) 39.0-39.9, adult: Secondary | ICD-10-CM | POA: Diagnosis not present

## 2017-11-08 DIAGNOSIS — E785 Hyperlipidemia, unspecified: Secondary | ICD-10-CM | POA: Diagnosis not present

## 2017-11-08 DIAGNOSIS — F329 Major depressive disorder, single episode, unspecified: Secondary | ICD-10-CM | POA: Diagnosis not present

## 2017-11-08 DIAGNOSIS — Z96653 Presence of artificial knee joint, bilateral: Secondary | ICD-10-CM | POA: Diagnosis not present

## 2017-11-08 DIAGNOSIS — Z885 Allergy status to narcotic agent status: Secondary | ICD-10-CM | POA: Insufficient documentation

## 2017-11-08 DIAGNOSIS — M87845 Other osteonecrosis, left finger(s): Secondary | ICD-10-CM | POA: Diagnosis not present

## 2017-11-08 DIAGNOSIS — J45909 Unspecified asthma, uncomplicated: Secondary | ICD-10-CM | POA: Insufficient documentation

## 2017-11-08 DIAGNOSIS — N3281 Overactive bladder: Secondary | ICD-10-CM | POA: Insufficient documentation

## 2017-11-08 DIAGNOSIS — R011 Cardiac murmur, unspecified: Secondary | ICD-10-CM | POA: Insufficient documentation

## 2017-11-08 DIAGNOSIS — Z79899 Other long term (current) drug therapy: Secondary | ICD-10-CM | POA: Insufficient documentation

## 2017-11-08 DIAGNOSIS — S63642A Sprain of metacarpophalangeal joint of left thumb, initial encounter: Secondary | ICD-10-CM | POA: Diagnosis not present

## 2017-11-08 DIAGNOSIS — G8918 Other acute postprocedural pain: Secondary | ICD-10-CM | POA: Diagnosis not present

## 2017-11-08 DIAGNOSIS — W230XXA Caught, crushed, jammed, or pinched between moving objects, initial encounter: Secondary | ICD-10-CM | POA: Insufficient documentation

## 2017-11-08 DIAGNOSIS — I10 Essential (primary) hypertension: Secondary | ICD-10-CM | POA: Insufficient documentation

## 2017-11-08 DIAGNOSIS — Z8249 Family history of ischemic heart disease and other diseases of the circulatory system: Secondary | ICD-10-CM | POA: Insufficient documentation

## 2017-11-08 DIAGNOSIS — G4733 Obstructive sleep apnea (adult) (pediatric): Secondary | ICD-10-CM | POA: Diagnosis not present

## 2017-11-08 DIAGNOSIS — W19XXXA Unspecified fall, initial encounter: Secondary | ICD-10-CM | POA: Diagnosis not present

## 2017-11-08 DIAGNOSIS — D682 Hereditary deficiency of other clotting factors: Secondary | ICD-10-CM | POA: Diagnosis not present

## 2017-11-08 DIAGNOSIS — E119 Type 2 diabetes mellitus without complications: Secondary | ICD-10-CM | POA: Diagnosis not present

## 2017-11-08 DIAGNOSIS — S5332XA Traumatic rupture of left ulnar collateral ligament, initial encounter: Secondary | ICD-10-CM | POA: Diagnosis not present

## 2017-11-08 DIAGNOSIS — Z7984 Long term (current) use of oral hypoglycemic drugs: Secondary | ICD-10-CM | POA: Insufficient documentation

## 2017-11-08 DIAGNOSIS — S63418A Traumatic rupture of collateral ligament of other finger at metacarpophalangeal and interphalangeal joint, initial encounter: Secondary | ICD-10-CM | POA: Insufficient documentation

## 2017-11-08 DIAGNOSIS — M170 Bilateral primary osteoarthritis of knee: Secondary | ICD-10-CM | POA: Diagnosis not present

## 2017-11-08 HISTORY — PX: ULNAR COLLATERAL LIGAMENT REPAIR: SHX6159

## 2017-11-08 LAB — GLUCOSE, CAPILLARY
GLUCOSE-CAPILLARY: 104 mg/dL — AB (ref 65–99)
Glucose-Capillary: 104 mg/dL — ABNORMAL HIGH (ref 65–99)

## 2017-11-08 SURGERY — REPAIR, LIGAMENT, ULNAR COLLATERAL
Anesthesia: General | Site: Thumb | Laterality: Left

## 2017-11-08 MED ORDER — CEFAZOLIN SODIUM-DEXTROSE 2-4 GM/100ML-% IV SOLN
INTRAVENOUS | Status: AC
  Filled 2017-11-08: qty 100

## 2017-11-08 MED ORDER — FENTANYL CITRATE (PF) 100 MCG/2ML IJ SOLN
50.0000 ug | INTRAMUSCULAR | Status: DC | PRN
  Administered 2017-11-08: 50 ug via INTRAVENOUS

## 2017-11-08 MED ORDER — PROPOFOL 500 MG/50ML IV EMUL
INTRAVENOUS | Status: AC
  Filled 2017-11-08: qty 50

## 2017-11-08 MED ORDER — ONDANSETRON HCL 4 MG/2ML IJ SOLN
INTRAMUSCULAR | Status: DC | PRN
  Administered 2017-11-08: 4 mg via INTRAVENOUS

## 2017-11-08 MED ORDER — 0.9 % SODIUM CHLORIDE (POUR BTL) OPTIME
TOPICAL | Status: DC | PRN
  Administered 2017-11-08: 200 mL

## 2017-11-08 MED ORDER — LIDOCAINE-EPINEPHRINE (PF) 1.5 %-1:200000 IJ SOLN
INTRAMUSCULAR | Status: DC | PRN
  Administered 2017-11-08: 10 mL via PERINEURAL

## 2017-11-08 MED ORDER — CEFAZOLIN SODIUM-DEXTROSE 2-4 GM/100ML-% IV SOLN
2.0000 g | INTRAVENOUS | Status: AC
  Administered 2017-11-08: 2 g via INTRAVENOUS

## 2017-11-08 MED ORDER — DEXAMETHASONE SODIUM PHOSPHATE 10 MG/ML IJ SOLN
INTRAMUSCULAR | Status: AC
  Filled 2017-11-08: qty 1

## 2017-11-08 MED ORDER — ROPIVACAINE HCL 5 MG/ML IJ SOLN
INTRAMUSCULAR | Status: DC | PRN
  Administered 2017-11-08: 20 mL via PERINEURAL

## 2017-11-08 MED ORDER — LIDOCAINE 2% (20 MG/ML) 5 ML SYRINGE
INTRAMUSCULAR | Status: DC | PRN
  Administered 2017-11-08: 60 mg via INTRAVENOUS

## 2017-11-08 MED ORDER — PROPOFOL 10 MG/ML IV BOLUS
INTRAVENOUS | Status: AC
  Filled 2017-11-08: qty 20

## 2017-11-08 MED ORDER — HYDROCODONE-ACETAMINOPHEN 5-325 MG PO TABS: 1.0000 | ORAL_TABLET | Freq: Four times a day (QID) | ORAL | 0 refills | Status: DC | PRN

## 2017-11-08 MED ORDER — SCOPOLAMINE 1 MG/3DAYS TD PT72: 1.0000 | MEDICATED_PATCH | Freq: Once | TRANSDERMAL | Status: DC | PRN

## 2017-11-08 MED ORDER — LACTATED RINGERS IV SOLN
INTRAVENOUS | Status: DC
  Administered 2017-11-08: 12:00:00 via INTRAVENOUS

## 2017-11-08 MED ORDER — CHLORHEXIDINE GLUCONATE 4 % EX LIQD: 60.0000 mL | Freq: Once | CUTANEOUS | Status: DC

## 2017-11-08 MED ORDER — PROPOFOL 10 MG/ML IV BOLUS
INTRAVENOUS | Status: DC | PRN
  Administered 2017-11-08: 200 mg via INTRAVENOUS
  Administered 2017-11-08: 50 mg via INTRAVENOUS

## 2017-11-08 MED ORDER — MIDAZOLAM HCL 2 MG/2ML IJ SOLN
1.0000 mg | INTRAMUSCULAR | Status: DC | PRN
  Administered 2017-11-08: 2 mg via INTRAVENOUS

## 2017-11-08 MED ORDER — DEXAMETHASONE SODIUM PHOSPHATE 4 MG/ML IJ SOLN
INTRAMUSCULAR | Status: DC | PRN
  Administered 2017-11-08: 10 mg via INTRAVENOUS

## 2017-11-08 MED ORDER — ONDANSETRON HCL 4 MG/2ML IJ SOLN
INTRAMUSCULAR | Status: AC
  Filled 2017-11-08: qty 2

## 2017-11-08 MED ORDER — FENTANYL CITRATE (PF) 100 MCG/2ML IJ SOLN
INTRAMUSCULAR | Status: AC
  Filled 2017-11-08: qty 2

## 2017-11-08 MED ORDER — MIDAZOLAM HCL 2 MG/2ML IJ SOLN
INTRAMUSCULAR | Status: AC
  Filled 2017-11-08: qty 2

## 2017-11-08 MED ORDER — LIDOCAINE 2% (20 MG/ML) 5 ML SYRINGE
INTRAMUSCULAR | Status: AC
  Filled 2017-11-08: qty 5

## 2017-11-08 SURGICAL SUPPLY — 67 items
BAG DECANTER FOR FLEXI CONT (MISCELLANEOUS) IMPLANT
BLADE MINI RND TIP GREEN BEAV (BLADE) ×2 IMPLANT
BLADE SURG 15 STRL LF DISP TIS (BLADE) ×1 IMPLANT
BLADE SURG 15 STRL SS (BLADE) ×1
BNDG COHESIVE 3X5 TAN STRL LF (GAUZE/BANDAGES/DRESSINGS) ×2 IMPLANT
BNDG ESMARK 4X9 LF (GAUZE/BANDAGES/DRESSINGS) ×2 IMPLANT
BNDG GAUZE ELAST 4 BULKY (GAUZE/BANDAGES/DRESSINGS) ×2 IMPLANT
CHLORAPREP W/TINT 26ML (MISCELLANEOUS) ×2 IMPLANT
CORD BIPOLAR FORCEPS 12FT (ELECTRODE) ×2 IMPLANT
COVER BACK TABLE 60X90IN (DRAPES) ×2 IMPLANT
COVER MAYO STAND STRL (DRAPES) ×2 IMPLANT
CUFF TOURNIQUET SINGLE 18IN (TOURNIQUET CUFF) ×2 IMPLANT
DECANTER SPIKE VIAL GLASS SM (MISCELLANEOUS) IMPLANT
DRAPE EXTREMITY T 121X128X90 (DRAPE) ×2 IMPLANT
DRAPE OEC MINIVIEW 54X84 (DRAPES) ×2 IMPLANT
DRAPE SURG 17X23 STRL (DRAPES) ×2 IMPLANT
GAUZE SPONGE 4X4 12PLY STRL (GAUZE/BANDAGES/DRESSINGS) ×2 IMPLANT
GAUZE XEROFORM 1X8 LF (GAUZE/BANDAGES/DRESSINGS) ×2 IMPLANT
GLOVE BIO SURGEON STRL SZ7.5 (GLOVE) ×2 IMPLANT
GLOVE BIOGEL PI IND STRL 7.0 (GLOVE) ×2 IMPLANT
GLOVE BIOGEL PI IND STRL 8 (GLOVE) ×1 IMPLANT
GLOVE BIOGEL PI IND STRL 8.5 (GLOVE) ×1 IMPLANT
GLOVE BIOGEL PI INDICATOR 7.0 (GLOVE) ×2
GLOVE BIOGEL PI INDICATOR 8 (GLOVE) ×1
GLOVE BIOGEL PI INDICATOR 8.5 (GLOVE) ×1
GLOVE ECLIPSE 6.5 STRL STRAW (GLOVE) ×2 IMPLANT
GLOVE SURG ORTHO 8.0 STRL STRW (GLOVE) ×2 IMPLANT
GOWN STRL REUS W/ TWL LRG LVL3 (GOWN DISPOSABLE) ×1 IMPLANT
GOWN STRL REUS W/TWL LRG LVL3 (GOWN DISPOSABLE) ×1
GOWN STRL REUS W/TWL XL LVL3 (GOWN DISPOSABLE) ×4 IMPLANT
K-WIRE .035X4 (WIRE) ×2 IMPLANT
NDL SAFETY ECLIPSE 18X1.5 (NEEDLE) IMPLANT
NEEDLE FISTULA 1/2 CIRCLE (NEEDLE) IMPLANT
NEEDLE HYPO 18GX1.5 SHARP (NEEDLE)
NEEDLE KEITH (NEEDLE) ×2 IMPLANT
NEEDLE KEITH SZ10 STRAIGHT (NEEDLE) ×2 IMPLANT
NEEDLE PRECISIONGLIDE 27X1.5 (NEEDLE) IMPLANT
NS IRRIG 1000ML POUR BTL (IV SOLUTION) ×2 IMPLANT
PACK BASIN DAY SURGERY FS (CUSTOM PROCEDURE TRAY) ×2 IMPLANT
PAD CAST 3X4 CTTN HI CHSV (CAST SUPPLIES) ×1 IMPLANT
PAD CAST 4YDX4 CTTN HI CHSV (CAST SUPPLIES) IMPLANT
PADDING CAST ABS 4INX4YD NS (CAST SUPPLIES) ×1
PADDING CAST ABS COTTON 4X4 ST (CAST SUPPLIES) ×1 IMPLANT
PADDING CAST COTTON 3X4 STRL (CAST SUPPLIES) ×1
PADDING CAST COTTON 4X4 STRL (CAST SUPPLIES)
PASSER SUT SWANSON 36MM LOOP (INSTRUMENTS) IMPLANT
SLEEVE SCD COMPRESS KNEE MED (MISCELLANEOUS) ×2 IMPLANT
SPLINT PLASTER CAST XFAST 3X15 (CAST SUPPLIES) IMPLANT
SPLINT PLASTER XTRA FASTSET 3X (CAST SUPPLIES)
STOCKINETTE 4X48 STRL (DRAPES) ×2 IMPLANT
SUT ETHIBOND 3-0 V-5 (SUTURE) ×2 IMPLANT
SUT ETHILON 4 0 PS 2 18 (SUTURE) ×2 IMPLANT
SUT FIBERWIRE 2-0 18 17.9 3/8 (SUTURE)
SUT FIBERWIRE 3-0 18 TAPR NDL (SUTURE)
SUT MERSILENE 2.0 SH NDLE (SUTURE) IMPLANT
SUT MERSILENE 3 0 FS 1 (SUTURE) ×2 IMPLANT
SUT MERSILENE 4 0 P 3 (SUTURE) ×2 IMPLANT
SUT SILK 4 0 PS 2 (SUTURE) IMPLANT
SUT STEEL 3 0 (SUTURE) IMPLANT
SUT STEEL 4 0 (SUTURE) IMPLANT
SUT VICRYL 4-0 PS2 18IN ABS (SUTURE) IMPLANT
SUTURE FIBERWR 2-0 18 17.9 3/8 (SUTURE) IMPLANT
SUTURE FIBERWR 3-0 18 TAPR NDL (SUTURE) IMPLANT
SYR BULB 3OZ (MISCELLANEOUS) ×2 IMPLANT
SYR CONTROL 10ML LL (SYRINGE) IMPLANT
TOWEL OR 17X24 6PK STRL BLUE (TOWEL DISPOSABLE) ×4 IMPLANT
UNDERPAD 30X30 (UNDERPADS AND DIAPERS) ×2 IMPLANT

## 2017-11-08 NOTE — H&P (Signed)
Kayla [name] is an 57 y.o. female.   Chief Complaint: pain left thumbHPI:Kayla [name] is a 57 year old right-hand-dominant female referred by Dr. Ron Agee for consultation regarding an injury she sustained to her left thumb. This was 4 months ago when she jammed it during a fall. She did not seek medical attention at that time. She has complained of pain sharp in nature with use. Aching otherwise. This is a visual analog scale score of 8-9/10 with use. She has tried can try taking Motrin with minimal relief. She has no prior history of injury. She complains of pain at the metacarpal phalangeal joint of her left thumb. She has a history of diabetes and arthritis. She has no history of thyroid problems or gout. Family history is positive diabetes negative for thyroid problems arthritis and gout.      Past Medical History:  Diagnosis Date  . Carpal tunnel syndrome of right wrist 10/2012  . Complication of anesthesia    hard to wake up after surg. 06/04/2012 and CO2 went up to 78  . Depressive disorder, not elsewhere classified   . Exertional dyspnea    occasionally  . 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 Headache(784.0)    due to allergies  . Heart murmur    states no problems  . Hyperlipidemia   . Mitral valve prolapse   . OSA on CPAP   . Osteoarthritis    knees  . Overactive bladder   . PTSD (post-traumatic stress disorder)   . Type II diabetes mellitus (HCC)    NIDDM    Past Surgical History:  Procedure Laterality Date  . BREAST BIOPSY Left 04/25/2001  . CARPAL TUNNEL RELEASE Right 10/24/2012   Procedure: CARPAL TUNNEL RELEASE;  Surgeon: Ninetta Lights, MD;  Location: [facility];  Service: Orthopedics;  Laterality: Right;  RIGHT CARPAL TUNNEL RELEASE  . KNEE ARTHROSCOPY Right 04/01/2008  . KNEE ARTHROSCOPY W/ MENISCECTOMY Left 08/16/2011   partial medial  . LASIK     bilateral  . PUBOVAGINAL SLING   08/27/2001   cysto; suprapubic tube placement  . SHOULDER ARTHROSCOPY W/ ROTATOR CUFF REPAIR Left 2004  . TOTAL ABDOMINAL HYSTERECTOMY  08/27/2001  . TOTAL KNEE ARTHROPLASTY  02/06/2012   Procedure: TOTAL KNEE ARTHROPLASTY;  Surgeon: Ninetta Lights, MD;  Location: [facility];  Service: Orthopedics;  Laterality: Right;  total knee right side  . TOTAL KNEE ARTHROPLASTY  06/04/2012   Procedure: TOTAL KNEE ARTHROPLASTY;  Surgeon: Ninetta Lights, MD;  Location: [facility];  Service: Orthopedics;  Laterality: Left;  Marland Kitchen VAGINAL HYSTERECTOMY  1980's    Family History  Problem Relation Age of Onset  . Heart disease Father   . COPD Mother   . Heart disease Mother   . Heart attack Brother   . Colon cancer Neg Hx    Social History:  reports that  has never smoked. she has never used smokeless tobacco. She reports that she does not drink alcohol or use drugs.  Allergies:  Allergies  Allergen Reactions  . Morphine And Related Shortness Of Breath and Itching    No medications prior to admission.    No results found for this or any previous visit (from the past 48 hour(s)).  No results found.   Pertinent items are noted in HPI.  Height 5\' 3"  (1.6 m), weight 99.8 kg (220 lb).  General appearance: alert, cooperative and appears stated age Head:  Normocephalic, without obvious abnormality Neck: no JVD Resp: clear to auscultation bilaterally Cardio: regular rate and rhythm, S1, S2 normal, no murmur, click, rub or gallop GI: soft, non-tender; bowel sounds normal; no masses,  no organomegaly Extremities: pain left thumb Pulses: 2+ and symmetric Skin: Skin color, texture, turgor normal. No rashes or lesions Neurologic: Grossly normal Incision/Wound: na  Assessment/Plan Assessment:  1. Gamekeeper's thumb of left hand    Plan: We have recommended repair reconstruction with possible APL transfer with her. Pre-peri-postoperative course are discussed along with risk complications. She is aware there  is no guarantee to the surgery the possibility of infection recurrence injury to arteries nerves tendons complete relief symptoms and dystrophy. She is scheduled for reconstruction ulnar collateral ligament left thumb as an outpatient under regional anesthesia.      Lauris Serviss R 11/08/2017, 9:53 AM

## 2017-11-08 NOTE — Anesthesia Preprocedure Evaluation (Signed)
Anesthesia Evaluation  Patient identified by MRN, date of birth, ID band Patient awake    Reviewed: Allergy & Precautions, NPO status , Patient's Chart, lab work & pertinent test results  Airway Mallampati: II  TM Distance: >3 FB Neck ROM: Full    Dental no notable dental hx.    Pulmonary asthma , sleep apnea ,    Pulmonary exam normal breath sounds clear to auscultation       Cardiovascular hypertension, Normal cardiovascular exam Rhythm:Regular Rate:Normal     Neuro/Psych negative neurological ROS  negative psych ROS   GI/Hepatic negative GI ROS, Neg liver ROS,   Endo/Other  diabetesMorbid obesity  Renal/GU negative Renal ROS  negative genitourinary   Musculoskeletal negative musculoskeletal ROS (+)   Abdominal (+) + obese,   Peds negative pediatric ROS (+)  Hematology negative hematology ROS (+)   Anesthesia Other Findings   Reproductive/Obstetrics negative OB ROS                             Anesthesia Physical Anesthesia Plan  ASA: III  Anesthesia Plan: General   Post-op Pain Management:  Regional for Post-op pain   Induction: Intravenous  PONV Risk Score and Plan: 3 and Ondansetron, Dexamethasone, Midazolam and Treatment may vary due to age or medical condition  Airway Management Planned: LMA  Additional Equipment:   Intra-op Plan:   Post-operative Plan: Extubation in OR  Informed Consent: I have reviewed the patients History and Physical, chart, labs and discussed the procedure including the risks, benefits and alternatives for the proposed anesthesia with the patient or authorized representative who has indicated his/her understanding and acceptance.   Dental advisory given  Plan Discussed with: CRNA and Surgeon  Anesthesia Plan Comments:         Anesthesia Quick Evaluation

## 2017-11-08 NOTE — Transfer of Care (Signed)
Immediate Anesthesia Transfer of Care Note  Patient: Kayla [name]  Procedure(s) Performed: REPAIR/RECONSTRUCTION LEFT THUMB ULNAR COLLATERAL LIGAMENT (Left Thumb)  Patient Location: PACU  Anesthesia Type:General  Level of Consciousness: awake and sedated  Airway & Oxygen Therapy: Patient Spontanous Breathing and Patient connected to face mask oxygen  Post-op Assessment: Report given to RN and Post -op Vital signs reviewed and stable  Post vital signs: Reviewed and stable  Last Vitals:  Vitals:   11/08/17 1345 11/08/17 1400  BP: 135/72 (!) 141/75  Pulse: 84 81  Resp: 16 16  Temp:    SpO2: 98% 96%    Last Pain:  Vitals:   11/08/17 1345  PainSc: 0-No pain      Patients Stated Pain Goal: 1 (57/84/69 [id number])  Complications: No apparent anesthesia complications

## 2017-11-08 NOTE — Op Note (Signed)
I assisted Surgeon(s) and Role:    * Daryll Brod, MD - Primary    * Leanora Cover, MD - Assisting on the Procedure(s): REPAIR/RECONSTRUCTION LEFT THUMB [place] on 11/08/2017.  I provided assistance on this case as follows: retraction soft tissues, passing and tying of suture.  Electronically signed by: Tennis Must, MD Date: 11/08/2017 Time: 3:09 PM

## 2017-11-08 NOTE — Anesthesia Procedure Notes (Signed)
Anesthesia Procedure Image    

## 2017-11-08 NOTE — Discharge Instructions (Addendum)
° °  ° ° ° °Hand Center Instructions °Hand Surgery ° °Wound Care: °Keep your hand elevated above the level of your heart.  Do not allow it to dangle by your side.  Keep the dressing dry and do not remove it unless your doctor advises you to do so.  He will usually change it at the time of your post-op visit.  Moving your fingers is advised to stimulate circulation but will depend on the site of your surgery.  If you have a splint applied, your doctor will advise you regarding movement. ° °Activity: °Do not drive or operate machinery today.  Rest today and then you may return to your normal activity and work as indicated by your physician. ° °Diet:  °Drink liquids today or eat a light diet.  You may resume a regular diet tomorrow.   ° °General expectations: °Pain for two to three days. °Fingers may become slightly swollen. ° °Call your doctor if any of the following occur: °Severe pain not relieved by pain medication. °Elevated temperature. °Dressing soaked with blood. °Inability to move fingers. °White or bluish color to fingers. ° ° °Regional Anesthesia Blocks ° °1. Numbness or the inability to move the "blocked" extremity may last from 3-48 hours after placement. The length of time depends on the medication injected and your individual response to the medication. If the numbness is not going away after 48 hours, call your surgeon. ° °2. The extremity that is blocked will need to be protected until the numbness is gone and the  Strength has returned. Because you cannot feel it, you will need to take extra care to avoid injury. Because it may be weak, you may have difficulty moving it or using it. You may not know what position it is in without looking at it while the block is in effect. ° °3. For blocks in the legs and feet, returning to weight bearing and walking needs to be done carefully. You will need to wait until the numbness is entirely gone and the strength has returned. You should be able to move your leg  and foot normally before you try and bear weight or walk. You will need someone to be with you when you first try to ensure you do not fall and possibly risk injury. ° °4. Bruising and tenderness at the needle site are common side effects and will resolve in a few days. ° °5. Persistent numbness or new problems with movement should be communicated to the surgeon or the [facility] ([phone])/ [facility] ([phone]). ° ° ° °Post Anesthesia Home Care Instructions ° °Activity: °Get plenty of rest for the remainder of the day. A responsible individual must stay with you for 24 hours following the procedure.  °For the next 24 hours, DO NOT: °-Drive a car °-Operate machinery °-Drink alcoholic beverages °-Take any medication unless instructed by your physician °-Make any legal decisions or sign important papers. ° °Meals: °Start with liquid foods such as gelatin or soup. Progress to regular foods as tolerated. Avoid greasy, spicy, heavy foods. If nausea and/or vomiting occur, drink only clear liquids until the nausea and/or vomiting subsides. Call your physician if vomiting continues. ° °Special Instructions/Symptoms: °Your throat may feel dry or sore from the anesthesia or the breathing tube placed in your throat during surgery. If this causes discomfort, gargle with warm salt water. The discomfort should disappear within 24 hours. ° °If you had a scopolamine patch placed behind your ear for   the management of post- operative nausea and/or vomiting: ° °1. The medication in the patch is effective for 72 hours, after which it should be removed.  Wrap patch in a tissue and discard in the trash. Wash hands thoroughly with soap and water. °2. You may remove the patch earlier than 72 hours if you experience unpleasant side effects which may include dry mouth, dizziness or visual disturbances. °3. Avoid touching the patch. Wash your hands with soap and water after contact with the patch. °  ° ° °

## 2017-11-08 NOTE — Anesthesia Procedure Notes (Signed)
Procedure Name: LMA Insertion Performed by: Jenise Iannelli W, CRNA Pre-anesthesia Checklist: Patient identified, Emergency Drugs available, Suction available and Patient being monitored Patient Re-evaluated:Patient Re-evaluated prior to induction Oxygen Delivery Method: Circle system utilized Preoxygenation: Pre-oxygenation with 100% oxygen Induction Type: IV induction Ventilation: Mask ventilation without difficulty LMA: LMA inserted LMA Size: 4.0 Number of attempts: 1 Placement Confirmation: positive ETCO2 Tube secured with: Tape Dental Injury: Teeth and Oropharynx as per pre-operative assessment        

## 2017-11-08 NOTE — Anesthesia Postprocedure Evaluation (Signed)
Anesthesia Post Note  Patient: Charolette Bultman Dimock  Procedure(s) Performed: REPAIR/RECONSTRUCTION LEFT THUMB ULNAR COLLATERAL LIGAMENT (Left Thumb)     Patient location during evaluation: PACU Anesthesia Type: General Level of consciousness: awake and alert Pain management: pain level controlled Vital Signs Assessment: post-procedure vital signs reviewed and stable Respiratory status: spontaneous breathing, nonlabored ventilation, respiratory function stable and patient connected to nasal cannula oxygen Cardiovascular status: blood pressure returned to baseline and stable Postop Assessment: no apparent nausea or vomiting Anesthetic complications: no    Last Vitals:  Vitals:   11/08/17 1515 11/08/17 1530  BP: 140/64 (!) 151/80  Pulse: 87 77  Resp: 15 17  Temp: 37 C   SpO2: 97% 99%    Last Pain:  Vitals:   11/08/17 1530  PainSc: 0-No pain                 Aleksandra Raben S

## 2017-11-08 NOTE — Anesthesia Procedure Notes (Signed)
Anesthesia Regional Block: Supraclavicular block   Pre-Anesthetic Checklist: ,, timeout performed, Correct Patient, Correct Site, Correct Laterality, Correct Procedure, Correct Position, site marked, Risks and benefits discussed,  Surgical consent,  Pre-op evaluation,  At surgeon's request and post-op pain management  Laterality: Left  Prep: chloraprep       Needles:  Injection technique: Single-shot  Needle Type: Echogenic Needle     Needle Length: 9cm      Additional Needles:   Procedures:,,,, ultrasound used (permanent image in chart),,,,  Narrative:  Start time: 11/08/2017 12:50 PM End time: 11/08/2017 1:01 PM Injection made incrementally with aspirations every 5 mL.  Performed by: Personally  Anesthesiologist: Myrtie Soman, MD  Additional Notes: Patient tolerated the procedure well without complications

## 2017-11-08 NOTE — Brief Op Note (Signed)
11/08/2017  3:09 PM  PATIENT:  Rox Mcgriff Vandyne  57 y.o. female  PRE-OPERATIVE DIAGNOSIS:  Left thumb ulnar collateral ligament avulsion  S53.32X  POST-OPERATIVE DIAGNOSIS:  Left thumb ulnar collateral ligament avulsion  S53.32X  PROCEDURE:  Procedure(s): REPAIR/RECONSTRUCTION LEFT THUMB ULNAR COLLATERAL LIGAMENT (Left)  SURGEON:  Surgeon(s) and Role:    * Daryll Brod, MD - Primary    * Leanora Cover, MD - Assisting  PHYSICIAN ASSISTANT:   ASSISTANTS: K Deaglan Lile,MD   ANESTHESIA:   regional and IV sedation  EBL:  2 mL   BLOOD ADMINISTERED:none  DRAINS: none   LOCAL MEDICATIONS USED:  NONE  SPECIMEN:  No Specimen  DISPOSITION OF SPECIMEN:  N/A  COUNTS:  YES  TOURNIQUET:   Total Tourniquet Time Documented: Upper Arm (Left) - 40 minutes Total: Upper Arm (Left) - 40 minutes   DICTATION: .Other Dictation: Dictation Number [id number]  PLAN OF CARE: Discharge to home after PACU  PATIENT DISPOSITION:  PACU - hemodynamically stable.

## 2017-11-08 NOTE — Op Note (Signed)
Other Dictation: Dictation Number [id number]

## 2017-11-08 NOTE — Progress Notes (Signed)
Assisted Dr. Rose with left, ultrasound guided, supraclavicular block. Side rails up, monitors on throughout procedure. See vital signs in flow sheet. Tolerated Procedure well. 

## 2017-11-09 NOTE — Op Note (Signed)
NAME:  Kayla [name], Kayla [name] NO.:  [id number]  MEDICAL RECORD NO.:  [id number]  LOCATION:                                 FACILITY:  PHYSICIAN:  Daryll Brod, M.D.       DATE OF BIRTH:  1960/11/07  DATE OF PROCEDURE:  11/08/2017 DATE OF DISCHARGE:                              OPERATIVE REPORT   PREOPERATIVE DIAGNOSIS:  Fracture rupture, ulnar collateral ligament, metacarpophalangeal joint, left thumb.  POSTOPERATIVE DIAGNOSIS:  Fracture rupture, ulnar collateral ligament, metacarpophalangeal joint, left thumb.  OPERATIONS:  Excision of necrotic bone fragment, repair of ulnar collateral ligament to the metacarpal, left thumb, metacarpophalangeal joint.  SURGEON:  Daryll Brod, MD.  ANESTHESIA:  Supraclavicular block with IV sedation.  PLACE OF SURGERY:  [facility].  HISTORY:  The patient is a 57 year old female with a history of an injury to her left thumb.  She did not seek medical attention for approximately 4 months.  Has had continued pain.  X-rays revealed a nonunion of an avulsed fracture fragment attached to the ulnar collateral ligament of the left thumb.  She is admitted for repair and reconstruction of the metacarpophalangeal joint.  Pre, peri, and postoperative courses have been discussed along with risks and complications.  She is aware that there is no guarantee to the surgery, the possibility of infection, recurrence of injury to arteries, nerves, and tendons, incomplete relief of symptoms, and dystrophy.  In the preoperative area, the patient is seen, the extremity is marked by both patient and surgeon, antibiotic given.  Supraclavicular block was carried out in the preoperative area.  DESCRIPTION OF PROCEDURE:  She was brought to the operating room where an IV sedation was given.  She was prepped and draped in supine position with the left arm free.  A 3-minute dry time was allowed and time-out was taken confirming the patient and  procedure.  The limb was exsanguinated with an Esmarch bandage.  Tourniquet was placed high on the arm was inflated to 250 mmHg.  A curvilinear incision was made over the metacarpophalangeal joint of the left thumb based ulnarly.  The dissection was carried down through subcutaneous tissue.  Bleeders were electrocauterized with bipolar.  The dorsal sensory nerve was identified and protected.  The adductor aponeurosis was then incised with sharp dissection.  This allowed separation of the adductor from the capsule. The dorsal capsule was opened allowing visualization of the collateral ligament.  There was a significant amount of scar present.  The fracture was immediately apparent without any healing.  The fracture was then opened.  The necrotic bone was removed with sharp dissection and a hemostatic rongeur.  The collateral ligament was then identified and mobilized.  This was left attached to the metacarpal.  Drill holes were then placed after debriding the fracture fragment in the metacarpal. These were done from an ulnar, volar and dorsal to the radial direction. These were done with large Lanny Hurst needles.  These were removed.  The tendon was then sutured with a 3-0 Mersilene suture.  This allowed the needles to be bent straight and passed through the drill holes that had been made with the [place] needles.  These were pushed through exiting  through the skin.  The subcutaneous tissue and skin were dissected free to the radial side.  This allowed the sutures to be picked up as they egressed from the metacarpal on the radial side.  Needles were cut off and the wound was copiously irrigated with saline and the ligament repaired to the defect of the old fracture.  This fully stabilized the joint in full extension and full flexion.  The wound was again irrigated.  The capsule was closed with running 4-0 Vicryl suture.  The adductor aponeurosis was repaired with a running 4-0 Mersilene  suture. The skin was closed with interrupted 4-0 nylon sutures.  A sterile compressive dressing and thumb spica splint were applied.  On deflation of the tourniquet, all fingers immediately pinked.  She was taken to the recovery room for observation in satisfactory condition.  She will be discharged to home, to return to the [facility] in 1 week, on Norco.          ______________________________ Daryll Brod, M.D.     GK/MEDQ  D:  11/08/2017  T:  11/09/2017  Job:  [id number]

## 2017-11-11 ENCOUNTER — Encounter (HOSPITAL_BASED_OUTPATIENT_CLINIC_OR_DEPARTMENT_OTHER): Payer: Self-pay | Admitting: Orthopedic Surgery

## 2017-11-15 DIAGNOSIS — S63659A Sprain of metacarpophalangeal joint of unspecified finger, initial encounter: Secondary | ICD-10-CM | POA: Insufficient documentation

## 2017-11-15 DIAGNOSIS — S5332XA Traumatic rupture of left ulnar collateral ligament, initial encounter: Secondary | ICD-10-CM | POA: Diagnosis not present

## 2017-12-13 DIAGNOSIS — S5332XA Traumatic rupture of left ulnar collateral ligament, initial encounter: Secondary | ICD-10-CM | POA: Diagnosis not present

## 2017-12-19 DIAGNOSIS — M25642 Stiffness of left hand, not elsewhere classified: Secondary | ICD-10-CM | POA: Diagnosis not present

## 2017-12-19 DIAGNOSIS — M25542 Pain in joints of left hand: Secondary | ICD-10-CM | POA: Diagnosis not present

## 2017-12-19 DIAGNOSIS — S5332XA Traumatic rupture of left ulnar collateral ligament, initial encounter: Secondary | ICD-10-CM | POA: Diagnosis not present

## 2017-12-25 ENCOUNTER — Other Ambulatory Visit: Payer: Self-pay | Admitting: Internal Medicine

## 2018-01-28 ENCOUNTER — Other Ambulatory Visit: Payer: Self-pay | Admitting: Internal Medicine

## 2018-02-04 DIAGNOSIS — Z01419 Encounter for gynecological examination (general) (routine) without abnormal findings: Secondary | ICD-10-CM | POA: Diagnosis not present

## 2018-02-04 DIAGNOSIS — Z1231 Encounter for screening mammogram for malignant neoplasm of breast: Secondary | ICD-10-CM | POA: Diagnosis not present

## 2018-02-04 DIAGNOSIS — R6882 Decreased libido: Secondary | ICD-10-CM | POA: Diagnosis not present

## 2018-02-10 DIAGNOSIS — M545 Low back pain: Secondary | ICD-10-CM | POA: Diagnosis not present

## 2018-02-10 DIAGNOSIS — M461 Sacroiliitis, not elsewhere classified: Secondary | ICD-10-CM | POA: Diagnosis not present

## 2018-02-27 ENCOUNTER — Other Ambulatory Visit: Payer: Self-pay | Admitting: Internal Medicine

## 2018-03-23 ENCOUNTER — Other Ambulatory Visit: Payer: Self-pay | Admitting: Internal Medicine

## 2018-04-20 ENCOUNTER — Other Ambulatory Visit: Payer: Self-pay | Admitting: Internal Medicine

## 2018-04-21 NOTE — Telephone Encounter (Signed)
Patient need to schedule an ov for more refills. 

## 2018-05-06 ENCOUNTER — Ambulatory Visit (INDEPENDENT_AMBULATORY_CARE_PROVIDER_SITE_OTHER): Payer: Managed Care, Other (non HMO) | Admitting: Internal Medicine

## 2018-05-06 ENCOUNTER — Encounter: Payer: Self-pay | Admitting: Internal Medicine

## 2018-05-06 VITALS — BP 132/64 | HR 69 | Temp 98.1°F | Ht 63.0 in | Wt 221.2 lb

## 2018-05-06 DIAGNOSIS — E7800 Pure hypercholesterolemia, unspecified: Secondary | ICD-10-CM | POA: Diagnosis not present

## 2018-05-06 DIAGNOSIS — M150 Primary generalized (osteo)arthritis: Secondary | ICD-10-CM

## 2018-05-06 DIAGNOSIS — G4733 Obstructive sleep apnea (adult) (pediatric): Secondary | ICD-10-CM

## 2018-05-06 DIAGNOSIS — I10 Essential (primary) hypertension: Secondary | ICD-10-CM

## 2018-05-06 DIAGNOSIS — E119 Type 2 diabetes mellitus without complications: Secondary | ICD-10-CM

## 2018-05-06 DIAGNOSIS — M159 Polyosteoarthritis, unspecified: Secondary | ICD-10-CM

## 2018-05-06 DIAGNOSIS — M8949 Other hypertrophic osteoarthropathy, multiple sites: Secondary | ICD-10-CM

## 2018-05-06 LAB — POCT GLYCOSYLATED HEMOGLOBIN (HGB A1C): Hemoglobin A1C: 6.4 % — AB (ref 4.0–5.6)

## 2018-05-06 MED ORDER — SERTRALINE HCL 100 MG PO TABS: 100.0000 mg | ORAL_TABLET | Freq: Every day | ORAL | 3 refills | Status: DC

## 2018-05-06 MED ORDER — METFORMIN HCL ER 500 MG PO TB24: ORAL_TABLET | ORAL | 11 refills | Status: DC

## 2018-05-06 MED ORDER — EMPAGLIFLOZIN 25 MG PO TABS: 25.0000 mg | ORAL_TABLET | Freq: Every day | ORAL | 6 refills | Status: DC

## 2018-05-06 NOTE — Progress Notes (Signed)
Subjective:    Patient ID: Kayla [name], female    DOB: [birth year], 59 y.o.   MRN: [id number]  HPI  57 year old patient who is seen today for a preventive health examination  She has type 2 diabetes which has been controlled on oral medications. She is doing well today except for some low back pain with radiation to the left upper leg.  She is followed by orthopedics. She has had an eye examination earlier in the year. Colonoscopy 2015  Past Medical History:  Diagnosis Date  . Carpal tunnel syndrome of right wrist 10/2012  . Complication of anesthesia    hard to wake up after surg. 06/04/2012 and CO2 went up to 78  . Depressive disorder, not elsewhere classified   . Exertional dyspnea    occasionally  . 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 Headache(784.0)    due to allergies  . Heart murmur    states no problems  . Hyperlipidemia   . Mitral valve prolapse   . OSA on CPAP   . Osteoarthritis    knees  . Overactive bladder   . PTSD (post-traumatic stress disorder)   . Type II diabetes mellitus (HCC)    NIDDM     Social History   Socioeconomic History  . Marital status: Married    Spouse name: Not on file  . Number of children: 2  . Years of education: Not on file  . Highest education level: Not on file  Occupational History  . Occupation: Curator  Social Needs  . Financial resource strain: Not on file  . Food insecurity:    Worry: Not on file    Inability: Not on file  . Transportation needs:    Medical: Not on file    Non-medical: Not on file  Tobacco Use  . Smoking status: Never Smoker  . Smokeless tobacco: Never Used  Substance and Sexual Activity  . Alcohol use: No  . Drug use: No  . Sexual activity: Yes  Lifestyle  . Physical activity:    Days per week: Not on file    Minutes per session: Not on file  . Stress: Not on file  Relationships  . Social connections:   Talks on phone: Not on file    Gets together: Not on file    Attends religious service: Not on file    Active member of club or organization: Not on file    Attends meetings of clubs or organizations: Not on file    Relationship status: Not on file  . Intimate partner violence:    Fear of current or ex partner: Not on file    Emotionally abused: Not on file    Physically abused: Not on file    Forced sexual activity: Not on file  Other Topics Concern  . Not on file  Social History Narrative  . Not on file    Past Surgical History:  Procedure Laterality Date  . BREAST BIOPSY Left 04/25/2001  . CARPAL TUNNEL RELEASE Right 10/24/2012   Procedure: CARPAL TUNNEL RELEASE;  Surgeon: Ninetta Lights, MD;  Location: [facility];  Service: Orthopedics;  Laterality: Right;  RIGHT CARPAL TUNNEL RELEASE  . KNEE ARTHROSCOPY Right 04/01/2008  . KNEE ARTHROSCOPY W/ MENISCECTOMY Left 08/16/2011   partial medial  . LASIK     bilateral  . PUBOVAGINAL SLING  08/27/2001   cysto; suprapubic tube placement  .  SHOULDER ARTHROSCOPY W/ ROTATOR CUFF REPAIR Left 2004  . TOTAL ABDOMINAL HYSTERECTOMY  08/27/2001  . TOTAL KNEE ARTHROPLASTY  02/06/2012   Procedure: TOTAL KNEE ARTHROPLASTY;  Surgeon: Ninetta Lights, MD;  Location: [facility];  Service: Orthopedics;  Laterality: Right;  total knee right side  . TOTAL KNEE ARTHROPLASTY  06/04/2012   Procedure: TOTAL KNEE ARTHROPLASTY;  Surgeon: Ninetta Lights, MD;  Location: [facility];  Service: Orthopedics;  Laterality: Left;  . ULNAR COLLATERAL LIGAMENT REPAIR Left 11/08/2017   Procedure: REPAIR/RECONSTRUCTION LEFT THUMB ULNAR COLLATERAL LIGAMENT;  Surgeon: Daryll Brod, MD;  Location: [facility];  Service: Orthopedics;  Laterality: Left;  Marland Kitchen VAGINAL HYSTERECTOMY  1980's    Family History  Problem Relation Age of Onset  . Heart disease Father   . COPD Mother   . Heart disease Mother   . Heart attack Brother   . Colon cancer Neg Hx      Allergies  Allergen Reactions  . Morphine And Related Shortness Of Breath and Itching    Current Outpatient Medications on File Prior to Visit  Medication Sig Dispense Refill  . albuterol (PROVENTIL HFA) 108 (90 Base) MCG/ACT inhaler Inhale 2 puffs into the lungs every 6 (six) hours as needed for wheezing or shortness of breath.    Marland Kitchen atorvastatin (LIPITOR) 20 MG tablet TAKE ONE TABLET BY MOUTH ONCE DAILY 30 tablet 5  . BREO ELLIPTA 100-25 MCG/INH AEPB INHALE 1 PUFF BY MOUTH ONCE DAILY 60 each 3  . estradiol (ESTRACE) 0.5 MG tablet Take 1 mg by mouth daily.     Marland Kitchen HYDROcodone-acetaminophen (NORCO) 5-325 MG tablet Take 1 tablet by mouth every 6 (six) hours as needed. 20 tablet 0  . JARDIANCE 25 MG TABS tablet TAKE 1 TABLET BY MOUTH ONCE DAILY 30 tablet 6  . Loperamide HCl (IMODIUM PO) Take 2 tablets by mouth 2 (two) times daily as needed.     Marland Kitchen LORazepam (ATIVAN) 0.5 MG tablet TAKE 1 TABLET BY MOUTH EVERY 8 HOURS AS NEEDED FOR ANXIETY 60 tablet 5  . meloxicam (MOBIC) 15 MG tablet TAKE 1 TABLET BY MOUTH ONCE DAILY 30 tablet 11  . metFORMIN (GLUCOPHAGE-XR) 500 MG 24 hr tablet TAKE 2 TABLETS BY MOUTH ONCE DAILY WITH BREAKFAST 60 tablet 11  . oxybutynin (DITROPAN XL) 15 MG 24 hr tablet TAKE 1 TABLET BY MOUTH ONCE DAILY 90 tablet 1  . rOPINIRole (REQUIP) 0.25 MG tablet 1 or 2 before bed for restless leg 30 tablet 5  . sertraline (ZOLOFT) 100 MG tablet TAKE 1 TABLET BY MOUTH ONCE DAILY 90 tablet 1  . testosterone cypionate (DEPOTESTOTERONE CYPIONATE) 100 MG/ML injection Inject 100 mg into the muscle every 28 (twenty-eight) days. For IM use only     No current facility-administered medications on file prior to visit.     BP 132/64 (BP Location: Right Arm, Patient Position: Sitting, Cuff Size: Large)   Pulse 69   Temp 98.1 F (36.7 C) (Oral)   Ht 5\' 3"  (1.6 m)   Wt 221 lb 3.2 oz (100.3 kg)   SpO2 97%   BMI 39.18 kg/m     Review of Systems  Constitutional: Negative.   HENT: Negative  for congestion, dental problem, hearing loss, rhinorrhea, sinus pressure, sore throat and tinnitus.   Eyes: Negative for pain, discharge and visual disturbance.  Respiratory: Negative for cough and shortness of breath.   Cardiovascular: Negative for chest pain, palpitations and leg swelling.  Gastrointestinal: Negative for abdominal distention, abdominal  pain, blood in stool, constipation, diarrhea, nausea and vomiting.  Genitourinary: Negative for difficulty urinating, dysuria, flank pain, frequency, hematuria, pelvic pain, urgency, vaginal bleeding, vaginal discharge and vaginal pain.  Musculoskeletal: Positive for arthralgias and back pain. Negative for gait problem and joint swelling.  Skin: Negative for rash.  Neurological: Negative for dizziness, syncope, speech difficulty, weakness, numbness and headaches.  Hematological: Negative for adenopathy.  Psychiatric/Behavioral: Negative for agitation, behavioral problems and dysphoric mood. The patient is not nervous/anxious.        Objective:   Physical Exam  Constitutional: She is oriented to person, place, and time. She appears well-developed and well-nourished.  Weight 221 Blood pressure well controlled  HENT:  Head: Normocephalic.  Right Ear: External ear normal.  Left Ear: External ear normal.  Mouth/Throat: Oropharynx is clear and moist.  Pharyngeal crowding  Eyes: Pupils are equal, round, and reactive to light. Conjunctivae and EOM are normal.  Neck: Normal range of motion. Neck supple. No thyromegaly present.  Cardiovascular: Normal rate, regular rhythm, normal heart sounds and intact distal pulses.  Pulmonary/Chest: Effort normal and breath sounds normal.  Abdominal: Soft. Bowel sounds are normal. She exhibits no mass. There is no tenderness.  Musculoskeletal: Normal range of motion.  Negative straight leg test  Surgical scars both knees  Lymphadenopathy:    She has no cervical adenopathy.  Neurological: She is alert and  oriented to person, place, and time.  Skin: Skin is warm and dry. No rash noted.  Psychiatric: She has a normal mood and affect. Her behavior is normal.          Assessment & Plan:   Preventive health examination Diabetes mellitus well controlled Essential hypertension stable Low back pain.  Follow-up orthopedics Osteoarthritis.  Status post bilateral total knee replacement surgery  Return in 3 to 6 months with a new provider Weight loss encouraged  Marletta Lor

## 2018-05-06 NOTE — Patient Instructions (Signed)
Limit your sodium (Salt) intake   Please check your hemoglobin A1c every 3-6 months    It is important that you exercise regularly, at least 20 minutes 3 to 4 times per week.  If you develop chest pain or shortness of breath seek  medical attention.  You need to lose weight.  Consider a lower calorie diet and regular exercise.  Orthopedic follow-up  Please see your eye doctor yearly to check for diabetic eye damage

## 2018-05-19 DIAGNOSIS — M545 Low back pain: Secondary | ICD-10-CM | POA: Diagnosis not present

## 2018-05-19 DIAGNOSIS — M5416 Radiculopathy, lumbar region: Secondary | ICD-10-CM | POA: Diagnosis not present

## 2018-05-26 DIAGNOSIS — M545 Low back pain: Secondary | ICD-10-CM | POA: Diagnosis not present

## 2018-06-03 DIAGNOSIS — M545 Low back pain: Secondary | ICD-10-CM | POA: Diagnosis not present

## 2018-06-03 DIAGNOSIS — M5416 Radiculopathy, lumbar region: Secondary | ICD-10-CM | POA: Diagnosis not present

## 2018-06-13 ENCOUNTER — Other Ambulatory Visit: Payer: Self-pay | Admitting: Internal Medicine

## 2018-06-13 NOTE — Telephone Encounter (Signed)
Ok to refill total 6 months 

## 2018-06-13 NOTE — Telephone Encounter (Signed)
CY Please advise on refill. Thanks.  

## 2018-07-14 DIAGNOSIS — R6882 Decreased libido: Secondary | ICD-10-CM | POA: Diagnosis not present

## 2018-08-20 ENCOUNTER — Encounter: Payer: Self-pay | Admitting: Internal Medicine

## 2018-08-20 ENCOUNTER — Ambulatory Visit (INDEPENDENT_AMBULATORY_CARE_PROVIDER_SITE_OTHER): Payer: Managed Care, Other (non HMO) | Admitting: Internal Medicine

## 2018-08-20 VITALS — BP 140/90 | HR 80 | Temp 98.7°F | Ht 63.0 in | Wt 219.4 lb

## 2018-08-20 DIAGNOSIS — F339 Major depressive disorder, recurrent, unspecified: Secondary | ICD-10-CM

## 2018-08-20 DIAGNOSIS — I10 Essential (primary) hypertension: Secondary | ICD-10-CM

## 2018-08-20 DIAGNOSIS — Z23 Encounter for immunization: Secondary | ICD-10-CM | POA: Diagnosis not present

## 2018-08-20 DIAGNOSIS — E7800 Pure hypercholesterolemia, unspecified: Secondary | ICD-10-CM

## 2018-08-20 DIAGNOSIS — G4733 Obstructive sleep apnea (adult) (pediatric): Secondary | ICD-10-CM

## 2018-08-20 DIAGNOSIS — E119 Type 2 diabetes mellitus without complications: Secondary | ICD-10-CM | POA: Diagnosis not present

## 2018-08-20 LAB — POCT GLYCOSYLATED HEMOGLOBIN (HGB A1C): Hemoglobin A1C: 7 % — AB (ref 4.0–5.6)

## 2018-08-20 MED ORDER — ALBUTEROL SULFATE HFA 108 (90 BASE) MCG/ACT IN AERS: 2.0000 | INHALATION_SPRAY | Freq: Four times a day (QID) | RESPIRATORY_TRACT | 3 refills | Status: AC | PRN

## 2018-08-20 NOTE — Progress Notes (Signed)
Established Patient Office Visit     CC/Reason for Visit: Establish care, follow-up on chronic medical conditions  HPI: Kayla [name] is a 57 y.o. female who is coming in today for the above mentioned reasons. Due for annual physical exam in August 2020.  Past Medical History is significant for: Diabetes that has been well controlled with her most recent A1c of 6.4, hypertension has been well controlled, hyperlipidemia that has been well controlled on Lipitor, obstructive sleep apnea on nightly CPAP, she has recently been having hip pain and received a steroid injection 2 to 3 weeks ago.  She was started on testosterone and estradiol years ago for hot flashes and continues to take them.  She has no acute complaints at today's visit.  She is interested in flu and shingles vaccination today.   Past Medical/Surgical History: Past Medical History:  Diagnosis Date  . Carpal tunnel syndrome of right wrist 10/2012  . Complication of anesthesia    hard to wake up after surg. 06/04/2012 and CO2 went up to 78  . Depressive disorder, not elsewhere classified   . Exertional dyspnea    occasionally  . 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 Headache(784.0)    due to allergies  . Heart murmur    states no problems  . Hyperlipidemia   . Mitral valve prolapse   . OSA on CPAP   . Osteoarthritis    knees  . Overactive bladder   . PTSD (post-traumatic stress disorder)   . Type II diabetes mellitus (HCC)    NIDDM    Past Surgical History:  Procedure Laterality Date  . BREAST BIOPSY Left 04/25/2001  . CARPAL TUNNEL RELEASE Right 10/24/2012   Procedure: CARPAL TUNNEL RELEASE;  Surgeon: Ninetta Lights, MD;  Location: [facility];  Service: Orthopedics;  Laterality: Right;  RIGHT CARPAL TUNNEL RELEASE  . KNEE ARTHROSCOPY Right 04/01/2008  . KNEE ARTHROSCOPY W/ MENISCECTOMY Left 08/16/2011   partial medial  .  LASIK     bilateral  . PUBOVAGINAL SLING  08/27/2001   cysto; suprapubic tube placement  . SHOULDER ARTHROSCOPY W/ ROTATOR CUFF REPAIR Left 2004  . TOTAL ABDOMINAL HYSTERECTOMY  08/27/2001  . TOTAL KNEE ARTHROPLASTY  02/06/2012   Procedure: TOTAL KNEE ARTHROPLASTY;  Surgeon: Ninetta Lights, MD;  Location: [facility];  Service: Orthopedics;  Laterality: Right;  total knee right side  . TOTAL KNEE ARTHROPLASTY  06/04/2012   Procedure: TOTAL KNEE ARTHROPLASTY;  Surgeon: Ninetta Lights, MD;  Location: [facility];  Service: Orthopedics;  Laterality: Left;  . ULNAR COLLATERAL LIGAMENT REPAIR Left 11/08/2017   Procedure: REPAIR/RECONSTRUCTION LEFT THUMB ULNAR COLLATERAL LIGAMENT;  Surgeon: Daryll Brod, MD;  Location: [facility];  Service: Orthopedics;  Laterality: Left;  Marland Kitchen VAGINAL HYSTERECTOMY  1980's    Social History:  reports that she has never smoked. She has never used smokeless tobacco. She reports that she does not drink alcohol or use drugs.  Allergies: Allergies  Allergen Reactions  . Morphine And Related Shortness Of Breath and Itching    Family History:  Family History  Problem Relation Age of Onset  . Heart disease Father   . COPD Mother   . Heart disease Mother   . Heart attack Brother   . Colon cancer Neg Hx      Current Outpatient Medications:  .  albuterol (PROVENTIL HFA) 108 (90 Base) MCG/ACT  inhaler, Inhale 2 puffs into the lungs every 6 (six) hours as needed for wheezing or shortness of breath., Disp: 18 g, Rfl: 3 .  atorvastatin (LIPITOR) 20 MG tablet, TAKE ONE TABLET BY MOUTH ONCE DAILY, Disp: 30 tablet, Rfl: 5 .  BREO ELLIPTA 100-25 MCG/INH AEPB, INHALE 1 PUFF BY MOUTH ONCE DAILY, Disp: 60 each, Rfl: 3 .  empagliflozin (JARDIANCE) 25 MG TABS tablet, Take 25 mg by mouth daily., Disp: 90 tablet, Rfl: 6 .  HYDROcodone-acetaminophen (NORCO) 5-325 MG tablet, Take 1 tablet by mouth every 6 (six) hours as needed. (Patient taking differently: Take 1 tablet by mouth  every 6 (six) hours as needed (ortho). ), Disp: 20 tablet, Rfl: 0 .  Loperamide HCl (IMODIUM PO), Take 2 tablets by mouth 2 (two) times daily as needed. , Disp: , Rfl:  .  LORazepam (ATIVAN) 0.5 MG tablet, TAKE 1 TABLET BY MOUTH EVERY 8 HOURS AS NEEDED FOR ANXIETY, Disp: 60 tablet, Rfl: 5 .  meloxicam (MOBIC) 15 MG tablet, TAKE 1 TABLET BY MOUTH ONCE DAILY, Disp: 30 tablet, Rfl: 11 .  metFORMIN (GLUCOPHAGE-XR) 500 MG 24 hr tablet, TAKE 2 TABLETS BY MOUTH ONCE DAILY WITH BREAKFAST, Disp: 180 tablet, Rfl: 11 .  oxybutynin (DITROPAN XL) 15 MG 24 hr tablet, TAKE 1 TABLET BY MOUTH ONCE DAILY, Disp: 90 tablet, Rfl: 1 .  sertraline (ZOLOFT) 100 MG tablet, Take 1 tablet (100 mg total) by mouth daily., Disp: 90 tablet, Rfl: 3 .  rOPINIRole (REQUIP) 0.25 MG tablet, TAKE 1 TO 2 TABLETS BY MOUTH BEFORE BEDTIME FOR  RESTLESS  LEG, Disp: 30 tablet, Rfl: 5  Review of Systems:  Constitutional: Denies fever, chills, diaphoresis, appetite change and fatigue.  HEENT: Denies photophobia, eye pain, redness, hearing loss, ear pain, congestion, sore throat, rhinorrhea, sneezing, mouth sores, trouble swallowing, neck pain, neck stiffness and tinnitus.   Respiratory: Denies SOB, DOE, cough, chest tightness,  and wheezing.   Cardiovascular: Denies chest pain, palpitations and leg swelling.  Gastrointestinal: Denies nausea, vomiting, abdominal pain, diarrhea, constipation, blood in stool and abdominal distention.  Genitourinary: Denies dysuria, urgency, frequency, hematuria, flank pain and difficulty urinating.  Endocrine: Denies: hot or cold intolerance, sweats, changes in hair or nails, polyuria, polydipsia. Musculoskeletal: Denies myalgias, back pain, joint swelling, arthralgias and gait problem.  Skin: Denies pallor, rash and wound.  Neurological: Denies dizziness, seizures, syncope, weakness, light-headedness, numbness and headaches.  Hematological: Denies adenopathy. Easy bruising, personal or family bleeding history   Psychiatric/Behavioral: Denies suicidal ideation, mood changes, confusion, nervousness, sleep disturbance and agitation    Physical Exam: Vitals:   08/20/18 0917  BP: 140/90  Pulse: 80  Temp: 98.7 F (37.1 C)  TempSrc: Oral  SpO2: 96%  Weight: 219 lb 6.4 oz (99.5 kg)  Height: 5\' 3"  (1.6 m)    Body mass index is 38.86 kg/m.   Constitutional: NAD, calm, comfortable Eyes: PERRL, lids and conjunctivae normal ENMT: Mucous membranes are moist. Posterior pharynx clear of any exudate or lesions. Normal dentition.  Neck: normal, supple, no masses, no thyromegaly Respiratory: clear to auscultation bilaterally, no wheezing, no crackles. Normal respiratory effort. No accessory muscle use.  Cardiovascular: Regular rate and rhythm, no murmurs / rubs / gallops. No extremity edema. 2+ pedal pulses. No carotid bruits.  Abdomen: no tenderness, no masses palpated. No hepatosplenomegaly. Bowel sounds positive.  Musculoskeletal: no clubbing / cyanosis. No joint deformity upper and lower extremities. Good ROM, no contractures. Normal muscle tone.  Skin: no rashes, lesions, ulcers. No induration Neurologic:  Grossly intact and nonfocal Psychiatric: Normal judgment and insight. Alert and oriented x 3. Normal mood.    Impression and Plan:  Diabetes mellitus with coincident hypertension ([place])  -A1c today has increased to 7.0. -She has been advised on lifestyle modifications to include increased physical activity and healthier food choices. -If no significant improvement in 69-month mark, will need to add medications to her regimen.  Pure hypercholesterolemia -Remains on Lipitor 40 mg, her LDL in February 2019 was 77 which is slightly above threshold for diabetic. -Again she has been counseled on lifestyle modifications, if no improvement on next check may need to consider increasing statin dose.  Depression, recurrent ([place]) -Mood is stable, remains on Zoloft. -PHQ 2/9 administered today with a  score of 0.  HYPERTENSION, BENIGN ESSENTIAL -Blood pressure is elevated today, patient attributes this to meeting a new provider. -Have advised daily BP log to bring in at next visit to determine if medication changes need to be made.  Obstructive sleep apnea -On nightly CPAP  Morbid obesity ([place]) -Discussed healthy lifestyle choices including increased physical activity and better food options.  Postmenopausal/hot flashes -Patient was started on testosterone and estradiol years ago for this reason, she has not had hot flashes in at least 4 years. -We discussed discontinuing hormone replacement which she agrees to.  Need for shingles vaccine Needs flu shot -These vaccinations will be administered today.    Patient Instructions  -It was nice meeting you today!  -Flu and first shingles vaccine today.  -PHQ 2/9 today (depression screening)  -I will plan to see you back in 3 to 4 months for continued diabetic management.  -Please make sure you are checking your blood pressures at least once a day and bring in this log into your next visit.  -Continue to work on healthy lifestyle choices, weight loss and increase physical.  -Stop taking testosterone and estradiol.     Lelon Frohlich, MD [facility]

## 2018-08-20 NOTE — Patient Instructions (Addendum)
-  It was nice meeting you today!  -Flu and first shingles vaccine today.  -PHQ 2/9 today (depression screening)  -I will plan to see you back in 3 to 4 months for continued diabetic management.  -Please make sure you are checking your blood pressures at least once a day and bring in this log into your next visit.  -Continue to work on healthy lifestyle choices, weight loss and increase physical.  -Stop taking testosterone and estradiol.

## 2018-09-09 ENCOUNTER — Encounter: Payer: Self-pay | Admitting: Internal Medicine

## 2018-09-09 ENCOUNTER — Ambulatory Visit (INDEPENDENT_AMBULATORY_CARE_PROVIDER_SITE_OTHER): Payer: Managed Care, Other (non HMO) | Admitting: Internal Medicine

## 2018-09-09 VITALS — BP 122/88 | HR 70 | Ht 63.0 in | Wt 221.8 lb

## 2018-09-09 DIAGNOSIS — J4520 Mild intermittent asthma, uncomplicated: Secondary | ICD-10-CM

## 2018-09-09 DIAGNOSIS — G4733 Obstructive sleep apnea (adult) (pediatric): Secondary | ICD-10-CM

## 2018-09-09 NOTE — Patient Instructions (Signed)
Your current meds should be sufficient for you asthma, but let us know if you have to start using your rescue inhaler more frequently to stay comfortable.  We can continue CPAP auto 5-15, mask of choice, humidifier, supplies, airview  You can turn up your CPAP humidifier and try using otc saline nasal GEL for dryness, as needed.  Please call if we can help

## 2018-09-09 NOTE — Progress Notes (Signed)
Subjective:    Patient ID: Kayla [name], female    DOB: 02/27/1961, 57 y.o.   MRN: [id number]  HPI female never smoker followed for OSA/Insomnia, Asthma, allergic rhinitis complicated by HBP, depression, DM NPSG 01/30/11-AHI 19.9/hour, desaturation to 88%, CPAP titration to 9, body weight 230 pounds FENO 01/04/17- 42 H Office Spirometry 01/04/17-mild restriction of exhaled volume. FVC 2.51/78%, FEV1 2.06/82%, ratio 0.82, FEF25-75% 2.27/95% ------------------------------------------------------------------------------------------- 09/09/17- 57 yo female never smoker followed for OSA/Insomnia,Asthma, complicated by HBP, depression, DM2 CPAP auto 5-15/ [facility] ----OSA; DME: [facility]. Pt wears CPAP nightly and DL attached.  She sleeps much better with CPAP and uses it every night except for a series of power outages recently which impact the download results.  70% compliance, AHI 2.1/hour.  Pressure is comfortable.  No concerns. Asthma control has done very well continuing Breo 100.  Rare need for rescue inhaler with no sleep disturbance.  09/09/2018- 57 yo female never smoker followed for OSA/Insomnia, Asthma, complicated by HBP, depression, DM2 CPAP auto 5-15/ [facility] Download 90% compliance AHI 2.0/hour States her cpap is working well but she is waking up with dry nasal passages but no sore throat.  Albuterol HFA, Breo 100 And recent increased asthma.  Using rescue inhaler about once daily.  Feels a little tighter through chest.  No fever, sore throat or discolored sputum.  ROS-see HPI            + = positive Constitutional:   No-   weight loss, night sweats, fevers, chills, fatigue, lassitude. HEENT:   No-  headaches, difficulty swallowing, tooth/dental problems, sore throat,       No-  sneezing, itching, ear ache, +nasal congestion, post nasal drip,  CV:  No-   chest pain, orthopnea, PND, swelling in lower extremities, anasarca,  dizziness, palpitations Resp:  No hemoptysis  Skin: No-    rash or lesions. GI:  No-   heartburn, indigestion, abdominal pain, nausea, vomiting,  GU: . MS:  No-   joint pain or swelling.  . Neuro-     nothing unusual Psych:  No- change in mood or affect. No depression or anxiety.  No memory loss.  OBJ- Physical Exam General- Alert, Oriented, Affect-appropriate, Distress- none acute, + overweight Skin- rash-none, lesions- none, excoriation- none Lymphadenopathy- none Head- atraumatic            Eyes- Gross vision intact, PERRLA, conjunctivae and secretions clear            Ears- Hearing, canals-normal            Nose- Clear, no-Septal dev, mucus, polyps, erosion, perforation             Throat- Mallampati III , mucosa clear , drainage- none, tonsils- atrophic Neck- flexible , trachea midline, no stridor , thyroid nl, carotid no bruit Chest - symmetrical excursion , unlabored           Heart/CV- RRR , no murmur , no gallop  , no rub, nl s1 s2                           - JVD- none , edema- none, stasis changes- none, varices- none           Lung- clear to P&A, wheeze- none, cough- none , dullness-none, rub- none           Chest wall-  Abd-  Br/ Gen/ Rectal- Not done, not indicated Extrem- cyanosis- none, clubbing, none, atrophy- none,  strength- nl Neuro- grossly intact to observation

## 2018-09-10 NOTE — Assessment & Plan Note (Signed)
Continues to benefit from CPAP with improved sleep quality, confirmed by download.  We discussed management of dry airway-adjust humidifier, add nasal saline gel. Plan-continue auto 5- 15

## 2018-09-10 NOTE — Assessment & Plan Note (Signed)
Very mild exacerbation related to seasonal weather change/indoor heat or mild viral infection. Plan-discussed use of her current inhalers.  She will let us know if they are insufficient.  Symptomatic therapy.

## 2018-09-25 DIAGNOSIS — E11319 Type 2 diabetes mellitus with unspecified diabetic retinopathy without macular edema: Secondary | ICD-10-CM | POA: Diagnosis not present

## 2018-10-15 ENCOUNTER — Ambulatory Visit (INDEPENDENT_AMBULATORY_CARE_PROVIDER_SITE_OTHER): Payer: Managed Care, Other (non HMO) | Admitting: Internal Medicine

## 2018-10-15 ENCOUNTER — Other Ambulatory Visit: Payer: Self-pay

## 2018-10-15 ENCOUNTER — Encounter: Payer: Self-pay | Admitting: Internal Medicine

## 2018-10-15 VITALS — BP 140/80 | HR 84 | Temp 98.2°F | Wt 220.3 lb

## 2018-10-15 DIAGNOSIS — T148XXA Other injury of unspecified body region, initial encounter: Secondary | ICD-10-CM | POA: Diagnosis not present

## 2018-10-15 DIAGNOSIS — M25512 Pain in left shoulder: Secondary | ICD-10-CM

## 2018-10-15 DIAGNOSIS — R05 Cough: Secondary | ICD-10-CM | POA: Diagnosis not present

## 2018-10-15 DIAGNOSIS — R059 Cough, unspecified: Secondary | ICD-10-CM

## 2018-10-15 NOTE — Patient Instructions (Addendum)
Good to see you today & hope you get well soon.  Instructions: -increase water intake to thin phelm -take tylenol sinus OTC for symptoms -take delsym OTC for coughing -take a zrytec or claritin OTC to decrease postnasal drip -use ice for left shoulder area, consider massage therapy -follow up 2-3 weeks if pain in shoulder does not improve

## 2018-10-15 NOTE — Progress Notes (Signed)
Established Patient Office Visit     CC/Reason for Visit: cough & shoulder pain  HPI: Kayla [name] is a 58 y.o. female who is coming in today for the above mentioned reason. PMH significant for diabetes, hypertension and OSA.  Patient sts shes had a cough for 3-4weeks, has congestion, coughing up green phelm and sore throat.  Denies fever, nausea and ear pain.  She says the cough is worse at night and shes taken mucinex OTC with no relief.  Patient c/o left shoulder pain that started almost 2 weeks ago, and she feels a constant tug or pull.  She sts it starts in her neck and radiates halfway to her elbow.  She denies injury, but sts she carries her purse on her left side.     Past Medical/Surgical History: Past Medical History:  Diagnosis Date  . Carpal tunnel syndrome of right wrist 10/2012  . Complication of anesthesia    hard to wake up after surg. 06/04/2012 and CO2 went up to 78  . Depressive disorder, not elsewhere classified   . Exertional dyspnea    occasionally  . 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 Headache(784.0)    due to allergies  . Heart murmur    states no problems  . Hyperlipidemia   . Mitral valve prolapse   . OSA on CPAP   . Osteoarthritis    knees  . Overactive bladder   . PTSD (post-traumatic stress disorder)   . Type II diabetes mellitus ([facility])    NIDDM    Past Surgical History:  Procedure Laterality Date  . BREAST BIOPSY Left 04/25/2001  . CARPAL TUNNEL RELEASE Right 10/24/2012   Procedure: CARPAL TUNNEL RELEASE;  Surgeon: Ninetta Lights, MD;  Location: [place];  Service: Orthopedics;  Laterality: Right;  RIGHT CARPAL TUNNEL RELEASE  . KNEE ARTHROSCOPY Right 04/01/2008  . KNEE ARTHROSCOPY W/ MENISCECTOMY Left 08/16/2011   partial medial  . LASIK     bilateral  . PUBOVAGINAL SLING  08/27/2001   cysto; suprapubic tube placement  . SHOULDER ARTHROSCOPY W/  ROTATOR CUFF REPAIR Left 2004  . TOTAL ABDOMINAL HYSTERECTOMY  08/27/2001  . TOTAL KNEE ARTHROPLASTY  02/06/2012   Procedure: TOTAL KNEE ARTHROPLASTY;  Surgeon: Ninetta Lights, MD;  Location[place];  Service: Orthopedics;  Laterality: Right;  total knee right side  . TOTAL KNEE ARTHROPLASTY  06/04/2012   Procedure: TOTAL KNEE ARTHROPLASTY;  Surgeon: Ninetta Lights, MD;  Location: [place];  Service: Orthopedics;  Laterality: Left;  . ULNAR COLLATERAL LIGAMENT REPAIR Left 11/08/2017   Procedure: REPAIR/RECONSTRUCTION LEFT THUMB ULNAR COLLATERAL LIGAMENT;  Surgeon: Daryll Brod, MD;  Location: [place];  Service: Orthopedics;  Laterality: Left;  Marland Kitchen VAGINAL HYSTERECTOMY  1980's    Social History:  reports that she has never smoked. She has never used smokeless tobacco. She reports that she does not drink alcohol or use drugs.  Allergies: Allergies  Allergen Reactions  . Morphine And Related Shortness Of Breath and Itching    Family History:  Family History  Problem Relation Age of Onset  . Heart disease Father   . COPD Mother   . Heart disease Mother   . Heart attack Brother   . Colon cancer Neg Hx      Current Outpatient Medications:  .  albuterol (PROVENTIL HFA) 108 (90 Base) MCG/ACT inhaler, Inhale 2 puffs into  the lungs every 6 (six) hours as needed for wheezing or shortness of breath., Disp: 18 g, Rfl: 3 .  atorvastatin (LIPITOR) 20 MG tablet, TAKE ONE TABLET BY MOUTH ONCE DAILY, Disp: 30 tablet, Rfl: 5 .  BREO ELLIPTA 100-25 MCG/INH AEPB, INHALE 1 PUFF BY MOUTH ONCE DAILY, Disp: 60 each, Rfl: 3 .  empagliflozin (JARDIANCE) 25 MG TABS tablet, Take 25 mg by mouth daily., Disp: 90 tablet, Rfl: 6 .  HYDROcodone-acetaminophen (NORCO) 5-325 MG tablet, Take 1 tablet by mouth every 6 (six) hours as needed. (Patient taking differently: Take 1 tablet by mouth every 6 (six) hours as needed (ortho). ), Disp: 20 tablet, Rfl: 0 .  Loperamide HCl (IMODIUM PO), Take 2 tablets by  mouth 2 (two) times daily as needed. , Disp: , Rfl:  .  LORazepam (ATIVAN) 0.5 MG tablet, TAKE 1 TABLET BY MOUTH EVERY 8 HOURS AS NEEDED FOR ANXIETY, Disp: 60 tablet, Rfl: 5 .  meloxicam (MOBIC) 15 MG tablet, TAKE 1 TABLET BY MOUTH ONCE DAILY, Disp: 30 tablet, Rfl: 11 .  metFORMIN (GLUCOPHAGE-XR) 500 MG 24 hr tablet, TAKE 2 TABLETS BY MOUTH ONCE DAILY WITH BREAKFAST, Disp: 180 tablet, Rfl: 11 .  oxybutynin (DITROPAN XL) 15 MG 24 hr tablet, TAKE 1 TABLET BY MOUTH ONCE DAILY, Disp: 90 tablet, Rfl: 1 .  rOPINIRole (REQUIP) 0.25 MG tablet, TAKE 1 TO 2 TABLETS BY MOUTH BEFORE BEDTIME FOR  RESTLESS  LEG, Disp: 30 tablet, Rfl: 5 .  sertraline (ZOLOFT) 100 MG tablet, Take 1 tablet (100 mg total) by mouth daily., Disp: 90 tablet, Rfl: 3  Review of Systems: Constitutional: Denies fever, chills, diaphoresis, appetite change and fatigue.  HEENT: Denies photophobia, eye pain, redness, hearing loss, ear pain, rhinorrhea, sneezing, mouth sores, trouble swallowing, neck pain, neck stiffness and tinnitus.  C/o coughing, sore throat and postnasal drip Respiratory: Denies SOB, DOE, chest tightness,  and wheezing.  C/o cough Cardiovascular: Denies chest pain, palpitations and leg swelling.  Gastrointestinal: Denies nausea, vomiting, abdominal pain, diarrhea, constipation, blood in stool and abdominal distention.  Genitourinary: Denies dysuria, urgency, frequency, hematuria, flank pain and difficulty urinating.  Endocrine: Denies: hot or cold intolerance, sweats, changes in hair or nails, polyuria, polydipsia. Musculoskeletal: Denies back pain, joint swelling, arthralgias and gait problem. C/o left shoulder pain that starts at her neck and radiates halfway to her elbow Skin: Denies pallor, rash and wound.  Neurological: Denies dizziness, seizures, syncope, weakness, light-headedness, numbness and headaches.  Hematological: Denies adenopathy. Easy bruising, personal or family bleeding history  Psychiatric/Behavioral:  Denies suicidal ideation, mood changes, confusion, nervousness, sleep disturbance and agitation   Physical Exam: Vitals:   10/15/18 0952  BP: 140/80  Pulse: 84  Temp: 98.2 F (36.8 C)  TempSrc: Oral  SpO2: 95%  Weight: 220 lb 4.8 oz (99.9 kg)    Body mass index is 39.02 kg/m.   Constitutional: NAD, calm, comfortable Eyes: PERRL, lids and conjunctivae normal ENMT: Mucous membranes are moist. Posterior pharynx clear of any exudate or lesions. Normal dentition. Tympanic membrane is pearly white, no erythema or bulging. Neck: normal, supple, no masses, no thyromegaly Respiratory: clear to auscultation bilaterally, no wheezing, no crackles. Normal respiratory effort. No accessory muscle use.  Cardiovascular: Regular rate and rhythm, no murmurs / rubs / gallops. No extremity edema. 2+ pedal pulses.  Musculoskeletal: No joint edema, FROM, performed dropped can test and she performed manuver perfectly   Impression and Plan:  Cough -likely post-URI cough. Advised this may last up to 6-8 weeks  post resolution of other URI symptoms. -take delsym OTC 2x a day for cough as needed -take zrytec/claritin OTC each morning   Left shoulder/neck strain -Sounds muscular. She carries her purse on that side. -apply ice to tight area as needed, RN ibuprofen and massage therapy recommended    Patient Instructions  Good to see you today & hope you get well soon.  Instructions: -increase water intake to thin phelm -take tylenol sinus OTC for symptoms -take delsym OTC for coughing -take a zrytec OTC to decrease postnasal drip -use ice for left shoulder area -follow up 2-3 weeks if pain in shoulder does not improve        Enzo Bi, RN DNP student [facility]

## 2018-11-19 ENCOUNTER — Other Ambulatory Visit: Payer: Self-pay

## 2018-11-19 ENCOUNTER — Ambulatory Visit (INDEPENDENT_AMBULATORY_CARE_PROVIDER_SITE_OTHER): Payer: Managed Care, Other (non HMO) | Admitting: Internal Medicine

## 2018-11-19 ENCOUNTER — Encounter: Payer: Self-pay | Admitting: Internal Medicine

## 2018-11-19 VITALS — BP 140/90 | HR 74 | Temp 98.6°F | Ht 63.0 in | Wt 225.9 lb

## 2018-11-19 DIAGNOSIS — N951 Menopausal and female climacteric states: Secondary | ICD-10-CM | POA: Diagnosis not present

## 2018-11-19 DIAGNOSIS — G4733 Obstructive sleep apnea (adult) (pediatric): Secondary | ICD-10-CM

## 2018-11-19 DIAGNOSIS — E119 Type 2 diabetes mellitus without complications: Secondary | ICD-10-CM

## 2018-11-19 DIAGNOSIS — Z23 Encounter for immunization: Secondary | ICD-10-CM | POA: Diagnosis not present

## 2018-11-19 DIAGNOSIS — I10 Essential (primary) hypertension: Secondary | ICD-10-CM | POA: Diagnosis not present

## 2018-11-19 LAB — POCT GLYCOSYLATED HEMOGLOBIN (HGB A1C): HEMOGLOBIN A1C: 7 % — AB (ref 4.0–5.6)

## 2018-11-19 MED ORDER — LISINOPRIL 10 MG PO TABS: 10.0000 mg | ORAL_TABLET | Freq: Every day | ORAL | 3 refills | Status: DC

## 2018-11-19 MED ORDER — VENLAFAXINE HCL 75 MG PO TABS: 75.0000 mg | ORAL_TABLET | Freq: Every day | ORAL | 3 refills | Status: DC

## 2018-11-19 NOTE — Patient Instructions (Addendum)
Great seeing you this morning.  Instructions: -Start taking lisinopril 10mg  daily for blood pressure -Start taking effexor 75mg  daily for menopausal symptoms -cont checking bp at home 2-3x a week and bring to next visit -you will receive your 2nd shingles vaccine dose today -follow up in 3 months    Menopause Menopause is the normal time of life when menstrual periods stop completely. It is usually confirmed by 12 months without a menstrual period. The transition to menopause (perimenopause) most often happens between the ages of 23 and 55. During perimenopause, hormone levels change in your body, which can cause symptoms and affect your health. Menopause may increase your risk for:  Loss of bone (osteoporosis), which causes bone breaks (fractures).  Depression.  Hardening and narrowing of the arteries (atherosclerosis), which can cause heart attacks and strokes. What are the causes? This condition is usually caused by a natural change in hormone levels that happens as you get older. The condition may also be caused by surgery to remove both ovaries (bilateral oophorectomy). What increases the risk? This condition is more likely to start at an earlier age if you have certain medical conditions or treatments, including:  A tumor of the pituitary gland in the brain.  A disease that affects the ovaries and hormone production.  Radiation treatment for cancer.  Certain cancer treatments, such as chemotherapy or hormone (anti-estrogen) therapy.  Heavy smoking and excessive alcohol use.  Family history of early menopause. This condition is also more likely to develop earlier in women who are very thin. What are the signs or symptoms? Symptoms of this condition include:  Hot flashes.  Irregular menstrual periods.  Night sweats.  Changes in feelings about sex. This could be a decrease in sex drive or an increased comfort around your sexuality.  Vaginal dryness and thinning of the  vaginal walls. This may cause painful intercourse.  Dryness of the skin and development of wrinkles.  Headaches.  Problems sleeping (insomnia).  Mood swings or irritability.  Memory problems.  Weight gain.  Hair growth on the face and chest.  Bladder infections or problems with urinating. How is this diagnosed? This condition is diagnosed based on your medical history, a physical exam, your age, your menstrual history, and your symptoms. Hormone tests may also be done. How is this treated? In some cases, no treatment is needed. You and your health care provider should make a decision together about whether treatment is necessary. Treatment will be based on your individual condition and preferences. Treatment for this condition focuses on managing symptoms. Treatment may include:  Menopausal hormone therapy (MHT).  Medicines to treat specific symptoms or complications.  Acupuncture.  Vitamin or herbal supplements. Before starting treatment, make sure to let your health care provider know if you have a personal or family history of:  Heart disease.  Breast cancer.  Blood clots.  Diabetes.  Osteoporosis. Follow these instructions at home: Lifestyle  Do not use any products that contain nicotine or tobacco, such as cigarettes and e-cigarettes. If you need help quitting, ask your health care provider.  Get at least 30 minutes of physical activity on 5 or more days each week.  Avoid alcoholic and caffeinated beverages, as well as spicy foods. This may help prevent hot flashes.  Get 7-8 hours of sleep each night.  If you have hot flashes, try: ? Dressing in layers. ? Avoiding things that may trigger hot flashes, such as spicy food, warm places, or stress. ? Taking slow, deep breaths  when a hot flash starts. ? Keeping a fan in your home and office.  Find ways to manage stress, such as deep breathing, meditation, or journaling.  Consider going to group therapy with  other women who are having menopause symptoms. Ask your health care provider about recommended group therapy meetings. Eating and drinking  Eat a healthy, balanced diet that contains whole grains, lean protein, low-fat dairy, and plenty of fruits and vegetables.  Your health care provider may recommend adding more soy to your diet. Foods that contain soy include tofu, tempeh, and soy milk.  Eat plenty of foods that contain calcium and vitamin D for bone health. Items that are rich in calcium include low-fat milk, yogurt, beans, almonds, sardines, broccoli, and kale. Medicines  Take over-the-counter and prescription medicines only as told by your health care provider.  Talk with your health care provider before starting any herbal supplements. If prescribed, take vitamins and supplements as told by your health care provider. These may include: ? Calcium. Women age 52 and older should get 1,200 mg (milligrams) of calcium every day. ? Vitamin D. Women need 600-800 International Units of vitamin D each day. ? Vitamins B12 and B6. Aim for 50 micrograms of B12 and 1.5 mg of B6 each day. General instructions  Keep track of your menstrual periods, including: ? When they occur. ? How heavy they are and how long they last. ? How much time passes between periods.  Keep track of your symptoms, noting when they start, how often you have them, and how long they last.  Use vaginal lubricants or moisturizers to help with vaginal dryness and improve comfort during sex.  Keep all follow-up visits as told by your health care provider. This is important. This includes any group therapy or counseling. Contact a health care provider if:  You are still having menstrual periods after age 85.  You have pain during sex.  You have not had a period for 12 months and you develop vaginal bleeding. Get help right away if:  You have: ? Severe depression. ? Excessive vaginal bleeding. ? Pain when you  urinate. ? A fast or irregular heart beat (palpitations). ? Severe headaches. ? Abdomen (abdominal) pain or severe indigestion.  You fell and you think you have a broken bone.  You develop leg or chest pain.  You develop vision problems.  You feel a lump in your breast. Summary  Menopause is the normal time of life when menstrual periods stop completely. It is usually confirmed by 12 months without a menstrual period.  The transition to menopause (perimenopause) most often happens between the ages of 83 and 38.  Symptoms can be managed through medicines, lifestyle changes, and complementary therapies such as acupuncture.  Eat a balanced diet that is rich in nutrients to promote bone health and heart health and to manage symptoms during menopause. This information is not intended to replace advice given to you by your health care provider. Make sure you discuss any questions you have with your health care provider. Document Released: 11/17/2003 Document Revised: 09/29/2016 Document Reviewed: 09/29/2016 Elsevier Interactive Patient Education  2019 [employer].    Diabetes Mellitus and Nutrition, Adult When you have diabetes (diabetes mellitus), it is very important to have healthy eating habits because your blood sugar (glucose) levels are greatly affected by what you eat and drink. Eating healthy foods in the appropriate amounts, at about the same times every day, can help you:  Control your blood glucose.  Lower your risk of heart disease.  Improve your blood pressure.  Reach or maintain a healthy weight. Every person with diabetes is different, and each person has different needs for a meal plan. Your health care provider may recommend that you work with a diet and nutrition specialist (dietitian) to make a meal plan that is best for you. Your meal plan may vary depending on factors such as:  The calories you need.  The medicines you take.  Your weight.  Your blood  glucose, blood pressure, and cholesterol levels.  Your activity level.  Other health conditions you have, such as heart or kidney disease. How do carbohydrates affect me? Carbohydrates, also called carbs, affect your blood glucose level more than any other type of food. Eating carbs naturally raises the amount of glucose in your blood. Carb counting is a method for keeping track of how many carbs you eat. Counting carbs is important to keep your blood glucose at a healthy level, especially if you use insulin or take certain oral diabetes medicines. It is important to know how many carbs you can safely have in each meal. This is different for every person. Your dietitian can help you calculate how many carbs you should have at each meal and for each snack. Foods that contain carbs include:  Bread, cereal, rice, pasta, and crackers.  Potatoes and corn.  Peas, beans, and lentils.  Milk and yogurt.  Fruit and juice.  Desserts, such as cakes, cookies, ice cream, and candy. How does alcohol affect me? Alcohol can cause a sudden decrease in blood glucose (hypoglycemia), especially if you use insulin or take certain oral diabetes medicines. Hypoglycemia can be a life-threatening condition. Symptoms of hypoglycemia (sleepiness, dizziness, and confusion) are similar to symptoms of having too much alcohol. If your health care provider says that alcohol is safe for you, follow these guidelines:  Limit alcohol intake to no more than 1 drink per day for nonpregnant women and 2 drinks per day for men. One drink equals 12 oz of beer, 5 oz of wine, or 1 oz of hard liquor.  Do not drink on an empty stomach.  Keep yourself hydrated with water, diet soda, or unsweetened iced tea.  Keep in mind that regular soda, juice, and other mixers may contain a lot of sugar and must be counted as carbs. What are tips for following this plan?  Reading food labels  Start by checking the serving size on the  "Nutrition Facts" label of packaged foods and drinks. The amount of calories, carbs, fats, and other nutrients listed on the label is based on one serving of the item. Many items contain more than one serving per package.  Check the total grams (g) of carbs in one serving. You can calculate the number of servings of carbs in one serving by dividing the total carbs by 15. For example, if a food has 30 g of total carbs, it would be equal to 2 servings of carbs.  Check the number of grams (g) of saturated and trans fats in one serving. Choose foods that have low or no amount of these fats.  Check the number of milligrams (mg) of salt (sodium) in one serving. Most people should limit total sodium intake to less than 2,300 mg per day.  Always check the nutrition information of foods labeled as "low-fat" or "nonfat". These foods may be higher in added sugar or refined carbs and should be avoided.  Talk to your dietitian to identify  your daily goals for nutrients listed on the label. Shopping  Avoid buying canned, premade, or processed foods. These foods tend to be high in fat, sodium, and added sugar.  Shop around the outside edge of the grocery store. This includes fresh fruits and vegetables, bulk grains, fresh meats, and fresh dairy. Cooking  Use low-heat cooking methods, such as baking, instead of high-heat cooking methods like deep frying.  Cook using healthy oils, such as olive, canola, or sunflower oil.  Avoid cooking with butter, cream, or high-fat meats. Meal planning  Eat meals and snacks regularly, preferably at the same times every day. Avoid going long periods of time without eating.  Eat foods high in fiber, such as fresh fruits, vegetables, beans, and whole grains. Talk to your dietitian about how many servings of carbs you can eat at each meal.  Eat 4-6 ounces (oz) of lean protein each day, such as lean meat, chicken, fish, eggs, or tofu. One oz of lean protein is equal  to: ? 1 oz of meat, chicken, or fish. ? 1 egg. ?  cup of tofu.  Eat some foods each day that contain healthy fats, such as avocado, nuts, seeds, and fish. Lifestyle  Check your blood glucose regularly.  Exercise regularly as told by your health care provider. This may include: ? 150 minutes of moderate-intensity or vigorous-intensity exercise each week. This could be brisk walking, biking, or water aerobics. ? Stretching and doing strength exercises, such as yoga or weightlifting, at least 2 times a week.  Take medicines as told by your health care provider.  Do not use any products that contain nicotine or tobacco, such as cigarettes and e-cigarettes. If you need help quitting, ask your health care provider.  Work with a Social worker or diabetes educator to identify strategies to manage stress and any emotional and social challenges. Questions to ask a health care provider  Do I need to meet with a diabetes educator?  Do I need to meet with a dietitian?  What number can I call if I have questions?  When are the best times to check my blood glucose? Where to find more information:  American Diabetes Association: diabetes.org  Academy of Nutrition and Dietetics: www.eatright.[employer] of Diabetes and Digestive and Kidney Diseases (NIH): [employer] Summary  A healthy meal plan will help you control your blood glucose and maintain a healthy lifestyle.  Working with a diet and nutrition specialist (dietitian) can help you make a meal plan that is best for you.  Keep in mind that carbohydrates (carbs) and alcohol have immediate effects on your blood glucose levels. It is important to count carbs and to use alcohol carefully. This information is not intended to replace advice given to you by your health care provider. Make sure you discuss any questions you have with your health care provider. Document Released: 05/24/2005 Document Revised: 03/27/2017 Document  Reviewed: 10/01/2016 Elsevier Interactive Patient Education  2019 [employer].

## 2018-11-19 NOTE — Progress Notes (Signed)
Established Patient Office Visit     CC/Reason for Visit: follow up chronic conditions, discuss hot flashes  HPI: Kayla [name] is a 58 y.o. female who is coming in today for the above mentioned reasons. Past Medical History is significant for hypertension, DM, OSA and hyperlipidemia.  Patient saw pulm in December for OSA follow up. No changes made and she cont to wear her CPAP.   She is requesting her 2nd dose of the shingles vaccine.  Patient sts shes been taking her bp at home and its averages 137/87 on a typical day. 140/90 in office today. Denies SOB, chest pain or edema to BLE.  Patient c/o having hot flashes every day several times a day.  Patient has tried black cohosh with no relief.  We stopped HRT last visit due to concerns with prolonged exposure.   Past Medical/Surgical History: Past Medical History:  Diagnosis Date  . Carpal tunnel syndrome of right wrist 10/2012  . Complication of anesthesia    hard to wake up after surg. 06/04/2012 and CO2 went up to 78  . Depressive disorder, not elsewhere classified   . Exertional dyspnea    occasionally  . 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 Headache(784.0)    due to allergies  . Heart murmur    states no problems  . Hyperlipidemia   . Mitral valve prolapse   . OSA on CPAP   . Osteoarthritis    knees  . Overactive bladder   . PTSD (post-traumatic stress disorder)   . Type II diabetes mellitus (HCC)    NIDDM    Past Surgical History:  Procedure Laterality Date  . BREAST BIOPSY Left 04/25/2001  . CARPAL TUNNEL RELEASE Right 10/24/2012   Procedure: CARPAL TUNNEL RELEASE;  Surgeon: Ninetta Lights, MD;  Location: [facility];  Service: Orthopedics;  Laterality: Right;  RIGHT CARPAL TUNNEL RELEASE  . KNEE ARTHROSCOPY Right 04/01/2008  . KNEE ARTHROSCOPY W/ MENISCECTOMY Left 08/16/2011   partial medial  . LASIK     bilateral  .  PUBOVAGINAL SLING  08/27/2001   cysto; suprapubic tube placement  . SHOULDER ARTHROSCOPY W/ ROTATOR CUFF REPAIR Left 2004  . TOTAL ABDOMINAL HYSTERECTOMY  08/27/2001  . TOTAL KNEE ARTHROPLASTY  02/06/2012   Procedure: TOTAL KNEE ARTHROPLASTY;  Surgeon: Ninetta Lights, MD;  Location: [facility];  Service: Orthopedics;  Laterality: Right;  total knee right side  . TOTAL KNEE ARTHROPLASTY  06/04/2012   Procedure: TOTAL KNEE ARTHROPLASTY;  Surgeon: Ninetta Lights, MD;  Location: [facility];  Service: Orthopedics;  Laterality: Left;  . ULNAR COLLATERAL LIGAMENT REPAIR Left 11/08/2017   Procedure: REPAIR/RECONSTRUCTION LEFT THUMB ULNAR COLLATERAL LIGAMENT;  Surgeon: Daryll Brod, MD;  Location: [facility];  Service: Orthopedics;  Laterality: Left;  Marland Kitchen VAGINAL HYSTERECTOMY  1980's    Social History:  reports that she has never smoked. She has never used smokeless tobacco. She reports that she does not drink alcohol or use drugs.  Allergies: Allergies  Allergen Reactions  . Morphine And Related Shortness Of Breath and Itching    Family History:  Family History  Problem Relation Age of Onset  . Heart disease Father   . COPD Mother   . Heart disease Mother   . Heart attack Brother   . Colon cancer Neg Hx      Current Outpatient Medications:  .  albuterol (PROVENTIL  HFA) 108 (90 Base) MCG/ACT inhaler, Inhale 2 puffs into the lungs every 6 (six) hours as needed for wheezing or shortness of breath., Disp: 18 g, Rfl: 3 .  atorvastatin (LIPITOR) 20 MG tablet, TAKE ONE TABLET BY MOUTH ONCE DAILY, Disp: 30 tablet, Rfl: 5 .  BREO ELLIPTA 100-25 MCG/INH AEPB, INHALE 1 PUFF BY MOUTH ONCE DAILY, Disp: 60 each, Rfl: 3 .  empagliflozin (JARDIANCE) 25 MG TABS tablet, Take 25 mg by mouth daily., Disp: 90 tablet, Rfl: 6 .  HYDROcodone-acetaminophen (NORCO) 5-325 MG tablet, Take 1 tablet by mouth every 6 (six) hours as needed. (Patient taking differently: Take 1 tablet by mouth every 6 (six) hours as  needed (ortho). Dr Percell Miller), Disp: 20 tablet, Rfl: 0 .  Loperamide HCl (IMODIUM PO), Take 2 tablets by mouth 2 (two) times daily as needed. , Disp: , Rfl:  .  LORazepam (ATIVAN) 0.5 MG tablet, TAKE 1 TABLET BY MOUTH EVERY 8 HOURS AS NEEDED FOR ANXIETY, Disp: 60 tablet, Rfl: 5 .  meloxicam (MOBIC) 15 MG tablet, TAKE 1 TABLET BY MOUTH ONCE DAILY, Disp: 30 tablet, Rfl: 11 .  metFORMIN (GLUCOPHAGE-XR) 500 MG 24 hr tablet, TAKE 2 TABLETS BY MOUTH ONCE DAILY WITH BREAKFAST, Disp: 180 tablet, Rfl: 11 .  oxybutynin (DITROPAN XL) 15 MG 24 hr tablet, TAKE 1 TABLET BY MOUTH ONCE DAILY, Disp: 90 tablet, Rfl: 1 .  rOPINIRole (REQUIP) 0.25 MG tablet, TAKE 1 TO 2 TABLETS BY MOUTH BEFORE BEDTIME FOR  RESTLESS  LEG, Disp: 30 tablet, Rfl: 5 .  sertraline (ZOLOFT) 100 MG tablet, Take 1 tablet (100 mg total) by mouth daily., Disp: 90 tablet, Rfl: 3 .  lisinopril (PRINIVIL,ZESTRIL) 10 MG tablet, Take 1 tablet (10 mg total) by mouth daily., Disp: 90 tablet, Rfl: 3 .  venlafaxine (EFFEXOR) 75 MG tablet, Take 1 tablet (75 mg total) by mouth daily., Disp: 90 tablet, Rfl: 3  Review of Systems:  Constitutional: Denies fever, chills, diaphoresis, appetite change and fatigue.  HEENT: Denies photophobia, eye pain, redness, hearing loss, ear pain, congestion, sore throat, rhinorrhea, sneezing, mouth sores, trouble swallowing, neck pain, neck stiffness and tinnitus.   Respiratory: Denies SOB, DOE, cough, chest tightness,  and wheezing.   Cardiovascular: Denies chest pain, palpitations and leg swelling.  Gastrointestinal: Denies nausea, vomiting, abdominal pain, diarrhea, constipation, blood in stool and abdominal distention.  Genitourinary: Denies dysuria, urgency, frequency, hematuria, flank pain and difficulty urinating.  Endocrine: Denies: cold intolerance, changes in hair or nails, polyuria, polydipsia. C/o hot flashes Musculoskeletal: Denies myalgias, back pain, joint swelling, arthralgias and gait problem.  Skin: Denies  pallor, rash and wound.  Neurological: Denies dizziness, seizures, syncope, weakness, light-headedness, numbness and headaches.  Hematological: Denies adenopathy. Easy bruising, personal or family bleeding history  Psychiatric/Behavioral: Denies suicidal ideation, mood changes, confusion, nervousness, sleep disturbance and agitation   Physical Exam: Vitals:   11/19/18 1006  BP: 140/90  Pulse: 74  Temp: 98.6 F (37 C)  TempSrc: Oral  SpO2: 95%  Weight: 225 lb 14.4 oz (102.5 kg)  Height: 5\' 3"  (1.6 m)    Body mass index is 40.02 kg/m.   Constitutional: NAD, calm, comfortable Eyes: PERRL, lids and conjunctivae normal Respiratory: clear to auscultation bilaterally, no wheezing, no crackles. Normal respiratory effort. No accessory muscle use.  Cardiovascular: Regular rate and rhythm, no murmurs / rubs / gallops. No extremity edema. 2+ pedal pulses. No carotid bruits.   Abd: obese, S/NT/ND/+BS Psychiatric: Normal judgment and insight. Alert and oriented x 3. Normal mood.  Impression and Plan:  HYPERTENSION, BENIGN ESSENTIAL -As BP remains elevated, will start lisinopril 10 mg daily. -RTC in 12 weeks for follow up. -check ambulatory BP and bring measurements in to next visit. -handout on heart healthy diet -f/u in 3 months  Diabetes mellitus with coincident hypertension ([facility]) -A1c remains at 7.0. -On metformin and Jardiance. -We have discussed lifestyle modifications, she will attempt to bring A1c <7.0 with diet and exercise, if unsuccessful, may need to consider additional meds.  Menopausal symptom -handout on how to treat symptoms -hot flashes have resumed now that she is off HRT. -start effexor 75mg  daily   Obstructive sleep apnea -cont wearing your cpap each night -f/u with pulm as needed    Patient Instructions  Great seeing you this morning.  Instructions: -Start taking lisinopril 10mg  daily for blood pressure -Start taking effexor 75mg  daily for  menopausal symptoms -cont checking bp at home 2-3x a week and bring to next visit -you will receive your 2nd shingles vaccine dose today -follow up in 3 months    Menopause Menopause is the normal time of life when menstrual periods stop completely. It is usually confirmed by 12 months without a menstrual period. The transition to menopause (perimenopause) most often happens between the ages of 2 and 69. During perimenopause, hormone levels change in your body, which can cause symptoms and affect your health. Menopause may increase your risk for:  Loss of bone (osteoporosis), which causes bone breaks (fractures).  Depression.  Hardening and narrowing of the arteries (atherosclerosis), which can cause heart attacks and strokes. What are the causes? This condition is usually caused by a natural change in hormone levels that happens as you get older. The condition may also be caused by surgery to remove both ovaries (bilateral oophorectomy). What increases the risk? This condition is more likely to start at an earlier age if you have certain medical conditions or treatments, including:  A tumor of the pituitary gland in the brain.  A disease that affects the ovaries and hormone production.  Radiation treatment for cancer.  Certain cancer treatments, such as chemotherapy or hormone (anti-estrogen) therapy.  Heavy smoking and excessive alcohol use.  Family history of early menopause. This condition is also more likely to develop earlier in women who are very thin. What are the signs or symptoms? Symptoms of this condition include:  Hot flashes.  Irregular menstrual periods.  Night sweats.  Changes in feelings about sex. This could be a decrease in sex drive or an increased comfort around your sexuality.  Vaginal dryness and thinning of the vaginal walls. This may cause painful intercourse.  Dryness of the skin and development of wrinkles.  Headaches.  Problems sleeping  (insomnia).  Mood swings or irritability.  Memory problems.  Weight gain.  Hair growth on the face and chest.  Bladder infections or problems with urinating. How is this diagnosed? This condition is diagnosed based on your medical history, a physical exam, your age, your menstrual history, and your symptoms. Hormone tests may also be done. How is this treated? In some cases, no treatment is needed. You and your health care provider should make a decision together about whether treatment is necessary. Treatment will be based on your individual condition and preferences. Treatment for this condition focuses on managing symptoms. Treatment may include:  Menopausal hormone therapy (MHT).  Medicines to treat specific symptoms or complications.  Acupuncture.  Vitamin or herbal supplements. Before starting treatment, make sure to let your health care provider  know if you have a personal or family history of:  Heart disease.  Breast cancer.  Blood clots.  Diabetes.  Osteoporosis. Follow these instructions at home: Lifestyle  Do not use any products that contain nicotine or tobacco, such as cigarettes and e-cigarettes. If you need help quitting, ask your health care provider.  Get at least 30 minutes of physical activity on 5 or more days each week.  Avoid alcoholic and caffeinated beverages, as well as spicy foods. This may help prevent hot flashes.  Get 7-8 hours of sleep each night.  If you have hot flashes, try: ? Dressing in layers. ? Avoiding things that may trigger hot flashes, such as spicy food, warm places, or stress. ? Taking slow, deep breaths when a hot flash starts. ? Keeping a fan in your home and office.  Find ways to manage stress, such as deep breathing, meditation, or journaling.  Consider going to group therapy with other women who are having menopause symptoms. Ask your health care provider about recommended group therapy meetings. Eating and drinking   Eat a healthy, balanced diet that contains whole grains, lean protein, low-fat dairy, and plenty of fruits and vegetables.  Your health care provider may recommend adding more soy to your diet. Foods that contain soy include tofu, tempeh, and soy milk.  Eat plenty of foods that contain calcium and vitamin D for bone health. Items that are rich in calcium include low-fat milk, yogurt, beans, almonds, sardines, broccoli, and kale. Medicines  Take over-the-counter and prescription medicines only as told by your health care provider.  Talk with your health care provider before starting any herbal supplements. If prescribed, take vitamins and supplements as told by your health care provider. These may include: ? Calcium. Women age 17 and older should get 1,200 mg (milligrams) of calcium every day. ? Vitamin D. Women need 600-800 International Units of vitamin D each day. ? Vitamins B12 and B6. Aim for 50 micrograms of B12 and 1.5 mg of B6 each day. General instructions  Keep track of your menstrual periods, including: ? When they occur. ? How heavy they are and how long they last. ? How much time passes between periods.  Keep track of your symptoms, noting when they start, how often you have them, and how long they last.  Use vaginal lubricants or moisturizers to help with vaginal dryness and improve comfort during sex.  Keep all follow-up visits as told by your health care provider. This is important. This includes any group therapy or counseling. Contact a health care provider if:  You are still having menstrual periods after age 9.  You have pain during sex.  You have not had a period for 12 months and you develop vaginal bleeding. Get help right away if:  You have: ? Severe depression. ? Excessive vaginal bleeding. ? Pain when you urinate. ? A fast or irregular heart beat (palpitations). ? Severe headaches. ? Abdomen (abdominal) pain or severe indigestion.  You fell and  you think you have a broken bone.  You develop leg or chest pain.  You develop vision problems.  You feel a lump in your breast. Summary  Menopause is the normal time of life when menstrual periods stop completely. It is usually confirmed by 12 months without a menstrual period.  The transition to menopause (perimenopause) most often happens between the ages of 59 and 53.  Symptoms can be managed through medicines, lifestyle changes, and complementary therapies such as acupuncture.  Eat  a balanced diet that is rich in nutrients to promote bone health and heart health and to manage symptoms during menopause. This information is not intended to replace advice given to you by your health care provider. Make sure you discuss any questions you have with your health care provider. Document Released: 11/17/2003 Document Revised: 09/29/2016 Document Reviewed: 09/29/2016 Elsevier Interactive Patient Education  2019 [place].    Diabetes Mellitus and Nutrition, Adult When you have diabetes (diabetes mellitus), it is very important to have healthy eating habits because your blood sugar (glucose) levels are greatly affected by what you eat and drink. Eating healthy foods in the appropriate amounts, at about the same times every day, can help you:  Control your blood glucose.  Lower your risk of heart disease.  Improve your blood pressure.  Reach or maintain a healthy weight. Every person with diabetes is different, and each person has different needs for a meal plan. Your health care provider may recommend that you work with a diet and nutrition specialist (dietitian) to make a meal plan that is best for you. Your meal plan may vary depending on factors such as:  The calories you need.  The medicines you take.  Your weight.  Your blood glucose, blood pressure, and cholesterol levels.  Your activity level.  Other health conditions you have, such as heart or kidney disease. How do  carbohydrates affect me? Carbohydrates, also called carbs, affect your blood glucose level more than any other type of food. Eating carbs naturally raises the amount of glucose in your blood. Carb counting is a method for keeping track of how many carbs you eat. Counting carbs is important to keep your blood glucose at a healthy level, especially if you use insulin or take certain oral diabetes medicines. It is important to know how many carbs you can safely have in each meal. This is different for every person. Your dietitian can help you calculate how many carbs you should have at each meal and for each snack. Foods that contain carbs include:  Bread, cereal, rice, pasta, and crackers.  Potatoes and corn.  Peas, beans, and lentils.  Milk and yogurt.  Fruit and juice.  Desserts, such as cakes, cookies, ice cream, and candy. How does alcohol affect me? Alcohol can cause a sudden decrease in blood glucose (hypoglycemia), especially if you use insulin or take certain oral diabetes medicines. Hypoglycemia can be a life-threatening condition. Symptoms of hypoglycemia (sleepiness, dizziness, and confusion) are similar to symptoms of having too much alcohol. If your health care provider says that alcohol is safe for you, follow these guidelines:  Limit alcohol intake to no more than 1 drink per day for nonpregnant women and 2 drinks per day for men. One drink equals 12 oz of beer, 5 oz of wine, or 1 oz of hard liquor.  Do not drink on an empty stomach.  Keep yourself hydrated with water, diet soda, or unsweetened iced tea.  Keep in mind that regular soda, juice, and other mixers may contain a lot of sugar and must be counted as carbs. What are tips for following this plan?  Reading food labels  Start by checking the serving size on the "Nutrition Facts" label of packaged foods and drinks. The amount of calories, carbs, fats, and other nutrients listed on the label is based on one serving of  the item. Many items contain more than one serving per package.  Check the total grams (g) of carbs in one  serving. You can calculate the number of servings of carbs in one serving by dividing the total carbs by 15. For example, if a food has 30 g of total carbs, it would be equal to 2 servings of carbs.  Check the number of grams (g) of saturated and trans fats in one serving. Choose foods that have low or no amount of these fats.  Check the number of milligrams (mg) of salt (sodium) in one serving. Most people should limit total sodium intake to less than 2,300 mg per day.  Always check the nutrition information of foods labeled as "low-fat" or "nonfat". These foods may be higher in added sugar or refined carbs and should be avoided.  Talk to your dietitian to identify your daily goals for nutrients listed on the label. Shopping  Avoid buying canned, premade, or processed foods. These foods tend to be high in fat, sodium, and added sugar.  Shop around the outside edge of the grocery store. This includes fresh fruits and vegetables, bulk grains, fresh meats, and fresh dairy. Cooking  Use low-heat cooking methods, such as baking, instead of high-heat cooking methods like deep frying.  Cook using healthy oils, such as olive, canola, or sunflower oil.  Avoid cooking with butter, cream, or high-fat meats. Meal planning  Eat meals and snacks regularly, preferably at the same times every day. Avoid going long periods of time without eating.  Eat foods high in fiber, such as fresh fruits, vegetables, beans, and whole grains. Talk to your dietitian about how many servings of carbs you can eat at each meal.  Eat 4-6 ounces (oz) of lean protein each day, such as lean meat, chicken, fish, eggs, or tofu. One oz of lean protein is equal to: ? 1 oz of meat, chicken, or fish. ? 1 egg. ?  cup of tofu.  Eat some foods each day that contain healthy fats, such as avocado, nuts, seeds, and fish.  Lifestyle  Check your blood glucose regularly.  Exercise regularly as told by your health care provider. This may include: ? 150 minutes of moderate-intensity or vigorous-intensity exercise each week. This could be brisk walking, biking, or water aerobics. ? Stretching and doing strength exercises, such as yoga or weightlifting, at least 2 times a week.  Take medicines as told by your health care provider.  Do not use any products that contain nicotine or tobacco, such as cigarettes and e-cigarettes. If you need help quitting, ask your health care provider.  Work with a Social worker or diabetes educator to identify strategies to manage stress and any emotional and social challenges. Questions to ask a health care provider  Do I need to meet with a diabetes educator?  Do I need to meet with a dietitian?  What number can I call if I have questions?  When are the best times to check my blood glucose? Where to find more information:  American Diabetes Association: diabetes.org  Academy of Nutrition and Dietetics: www.eatright.[employer] of Diabetes and Digestive and Kidney Diseases (NIH): [employer] Summary  A healthy meal plan will help you control your blood glucose and maintain a healthy lifestyle.  Working with a diet and nutrition specialist (dietitian) can help you make a meal plan that is best for you.  Keep in mind that carbohydrates (carbs) and alcohol have immediate effects on your blood glucose levels. It is important to count carbs and to use alcohol carefully. This information is not intended to replace advice given to you  by your health care provider. Make sure you discuss any questions you have with your health care provider. Document Released: 05/24/2005 Document Revised: 03/27/2017 Document Reviewed: 10/01/2016 Elsevier Interactive Patient Education  2019 [place], RN DNP Student [facility]

## 2018-12-05 ENCOUNTER — Telehealth: Payer: Self-pay | Admitting: *Deleted

## 2018-12-05 MED ORDER — ATORVASTATIN CALCIUM 20 MG PO TABS: 20.0000 mg | ORAL_TABLET | Freq: Every day | ORAL | 1 refills | Status: DC

## 2018-12-05 MED ORDER — OXYBUTYNIN CHLORIDE ER 15 MG PO TB24: 15.0000 mg | ORAL_TABLET | Freq: Every day | ORAL | 1 refills | Status: DC

## 2018-12-05 NOTE — Telephone Encounter (Signed)
Refills sent

## 2018-12-09 ENCOUNTER — Ambulatory Visit: Payer: Managed Care, Other (non HMO) | Admitting: Internal Medicine

## 2019-01-13 ENCOUNTER — Telehealth: Payer: Self-pay | Admitting: Internal Medicine

## 2019-01-13 NOTE — Telephone Encounter (Signed)
Copied from [place] [id number]. Topic: Quick Communication - Rx Refill/Question >> Jan 13, 2019  4:33 PM Ahmed Prima L wrote: Medication: BREO ELLIPTA 100-25 MCG/INH AEPB  Has the patient contacted their pharmacy? Yes not getting reply  (Agent: If no, request that the patient contact the pharmacy for the refill.) (Agent: If yes, when and what did the pharmacy advise?)  Preferred Pharmacy (with phone number or street name): [place], [place] - [address] [id number] [place] [address] Phone: [phone] Fax: [phone]    Agent: Please be advised that RX refills may take up to 3 business days. We ask that you follow-up with your pharmacy.

## 2019-01-14 MED ORDER — FLUTICASONE FUROATE-VILANTEROL 100-25 MCG/INH IN AEPB: INHALATION_SPRAY | RESPIRATORY_TRACT | 3 refills | Status: DC

## 2019-01-14 NOTE — Telephone Encounter (Signed)
Refill sent.

## 2019-01-26 ENCOUNTER — Other Ambulatory Visit: Payer: Self-pay | Admitting: *Deleted

## 2019-01-26 MED ORDER — MELOXICAM 15 MG PO TABS: 15.0000 mg | ORAL_TABLET | Freq: Every day | ORAL | 0 refills | Status: DC

## 2019-02-19 ENCOUNTER — Ambulatory Visit: Payer: Managed Care, Other (non HMO) | Admitting: Internal Medicine

## 2019-03-02 ENCOUNTER — Ambulatory Visit (INDEPENDENT_AMBULATORY_CARE_PROVIDER_SITE_OTHER): Payer: Managed Care, Other (non HMO) | Admitting: Pulmonary Disease

## 2019-03-02 ENCOUNTER — Other Ambulatory Visit: Payer: Self-pay

## 2019-03-02 ENCOUNTER — Encounter: Payer: Self-pay | Admitting: Pulmonary Disease

## 2019-03-02 ENCOUNTER — Ambulatory Visit: Payer: Managed Care, Other (non HMO) | Admitting: Internal Medicine

## 2019-03-02 DIAGNOSIS — J4520 Mild intermittent asthma, uncomplicated: Secondary | ICD-10-CM

## 2019-03-02 DIAGNOSIS — J3089 Other allergic rhinitis: Secondary | ICD-10-CM

## 2019-03-02 DIAGNOSIS — G4733 Obstructive sleep apnea (adult) (pediatric): Secondary | ICD-10-CM | POA: Diagnosis not present

## 2019-03-02 DIAGNOSIS — G4700 Insomnia, unspecified: Secondary | ICD-10-CM

## 2019-03-02 DIAGNOSIS — J302 Other seasonal allergic rhinitis: Secondary | ICD-10-CM

## 2019-03-02 MED ORDER — BREO ELLIPTA 100-25 MCG/INH IN AEPB: 1.0000 | INHALATION_SPRAY | Freq: Every day | RESPIRATORY_TRACT | 0 refills | Status: AC

## 2019-03-02 NOTE — Patient Instructions (Addendum)
Please start taking chlorpheniramine (aka Chlor tabs) 4 mg tablet (1 to 2 tablets at night) for management of allergies and postnasal drip at night >>> This is an over-the-counter medication >>> This medication is sedating  Please start taking a daily antihistamine in the morning:  >>>choose one of: zyrtec, claritin, allegra, or xyzal  >>>these are over the counter medications  >>>can choose generic option  >>>take daily  >>>this medication helps with allergies, post nasal drip, and cough   Can use fluticasone nasal spray 1 spray each nostril for daily for next 7 day, then use as needed   Continue nasal saline rinses    Ok to stop zzzquil    Breo Ellipta 100 >>> Take 1 puff daily in the morning right when you wake up >>>Rinse your mouth out after use >>>This is a daily maintenance inhaler, NOT a rescue inhaler >>>Contact our office if you are having difficulties affording or obtaining this medication >>>It is important for you to be able to take this daily and not miss any doses     We recommend that you continue using your CPAP daily >>>Keep up the hard work using your device >>> Goal should be wearing this for the entire night that you are sleeping, at least 4 to 6 hours  Remember:  . Do not drive or operate heavy machinery if tired or drowsy.  . Please notify the supply company and office if you are unable to use your device regularly due to missing supplies or machine being broken.  . Work on maintaining a healthy weight and following your recommended nutrition plan  . Maintain proper daily exercise and movement  . Maintaining proper use of your device can also help improve management of other chronic illnesses such as: Blood pressure, blood sugars, and weight management.   BiPAP/ CPAP Cleaning:  >>>Clean weekly, with Dawn soap, and bottle brush.  Set up to air dry.    Return in about 1 year (around 03/01/2020), or if symptoms worsen or fail to improve, for Follow up  with Dr. Annamaria Boots, Follow up with Wyn Quaker FNP-C.    Coronavirus (COVID-19) Are you at risk?  Are you at risk for the Coronavirus (COVID-19)?  To be considered HIGH RISK for Coronavirus (COVID-19), you have to meet the following criteria:  . Traveled to [place], [place], [place], [place] or [place]; or in the [place] to [place], [place], [place], or [place]; and have fever, cough, and shortness of breath within the last 2 weeks of travel OR . Been in close contact with a person diagnosed with COVID-19 within the last 2 weeks and have fever, cough, and shortness of breath . IF YOU DO NOT MEET THESE CRITERIA, YOU ARE CONSIDERED LOW RISK FOR COVID-19.  What to do if you are HIGH RISK for COVID-19?  Marland Kitchen If you are having a medical emergency, call 911. . Seek medical care right away. Before you go to a doctor's office, urgent care or emergency department, call ahead and tell them about your recent travel, contact with someone diagnosed with COVID-19, and your symptoms. You should receive instructions from your physician's office regarding next steps of care.  . When you arrive at healthcare provider, tell the healthcare staff immediately you have returned from visiting [place], [place], [place], [place] or [place]; or traveled in the [place] to [place], [place], [place], or [place]; in the last two weeks or you have been in close contact with a  person diagnosed with COVID-19 in the last 2 weeks.   . Tell the health care staff about your symptoms: fever, cough and shortness of breath. . After you have been seen by a medical provider, you will be either: o Tested for (COVID-19) and discharged home on quarantine except to seek medical care if symptoms worsen, and asked to  - Stay home and avoid contact with others until you get your results (4-5 days)  - Avoid travel on public transportation if possible (such as bus, train, or airplane) or o Sent to the Emergency Department by  EMS for evaluation, COVID-19 testing, and possible admission depending on your condition and test results.  What to do if you are LOW RISK for COVID-19?  Reduce your risk of any infection by using the same precautions used for avoiding the common cold or flu:  Marland Kitchen Wash your hands often with soap and warm water for at least 20 seconds.  If soap and water are not readily available, use an alcohol-based hand sanitizer with at least 60% alcohol.  . If coughing or sneezing, cover your mouth and nose by coughing or sneezing into the elbow areas of your shirt or coat, into a tissue or into your sleeve (not your hands). . Avoid shaking hands with others and consider head nods or verbal greetings only. . Avoid touching your eyes, nose, or mouth with unwashed hands.  . Avoid close contact with people who are sick. . Avoid places or events with large numbers of people in one location, like concerts or sporting events. . Carefully consider travel plans you have or are making. . If you are planning any travel outside or inside the [place], visit the CDC's Travelers' Health webpage for the latest health notices. . If you have some symptoms but not all symptoms, continue to monitor at home and seek medical attention if your symptoms worsen. . If you are having a medical emergency, call 911.   [place] / e-Visit: [email or web address]         [facility]: [facility]: [phone]                   [facility]: [phone]           It is flu season:   >>> Best ways to protect herself from the flu: Receive the yearly flu vaccine, practice good hand hygiene washing with soap and also using hand sanitizer when available, eat a nutritious meals, get adequate rest, hydrate appropriately   Please contact the office if your symptoms worsen or you have  concerns that you are not improving.   Thank you for choosing [facility] for your healthcare, and for allowing [place] to partner with you on your healthcare journey. I am thankful to be able to provide care to you today.   Wyn Quaker FNP-C     Allergic Rhinitis, Adult Allergic rhinitis is a reaction to allergens in the air. Allergens are tiny specks (particles) in the air that cause your body to have an allergic reaction. This condition cannot be passed from person to person (is not contagious). Allergic rhinitis cannot be cured, but it can be controlled. There are two types of allergic rhinitis:  Seasonal. This type is also called hay fever. It happens only during certain times of the year.  Perennial. This type can happen at any time of the year. What are the  causes? This condition may be caused by:  Pollen from grasses, trees, and weeds.  House dust mites.  Pet dander.  Mold. What are the signs or symptoms? Symptoms of this condition include:  Sneezing.  Runny or stuffy nose (nasal congestion).  A lot of mucus in the back of the throat (postnasal drip).  Itchy nose.  Tearing of the eyes.  Trouble sleeping.  Being sleepy during day. How is this treated? There is no cure for this condition. You should avoid things that trigger your symptoms (allergens). Treatment can help to relieve symptoms. This may include:  Medicines that block allergy symptoms, such as antihistamines. These may be given as a shot, nasal spray, or pill.  Shots that are given until your body becomes less sensitive to the allergen (desensitization).  Stronger medicines, if all other treatments have not worked. Follow these instructions at home: Avoiding allergens   Find out what you are allergic to. Common allergens include smoke, dust, and pollen.  Avoid them if you can. These are some of the things that you can do to avoid allergens: ? Replace carpet with wood, tile, or vinyl  flooring. Carpet can trap dander and dust. ? Clean any mold found in the home. ? Do not smoke. Do not allow smoking in your home. ? Change your heating and air conditioning filter at least once a month. ? During allergy season:  Keep windows closed as much as you can. If possible, use air conditioning when there is a lot of pollen in the air.  Use a special filter for allergies with your furnace and air conditioner.  Plan outdoor activities when pollen counts are lowest. This is usually during the early morning or evening hours.  If you do go outdoors when pollen count is high, wear a special mask for people with allergies.  When you come indoors, take a shower and change your clothes before sitting on furniture or bedding. General instructions  Do not use fans in your home.  Do not hang clothes outside to dry.  Wear sunglasses to keep pollen out of your eyes.  Wash your hands right away after you touch household pets.  Take over-the-counter and prescription medicines only as told by your doctor.  Keep all follow-up visits as told by your doctor. This is important. Contact a doctor if:  You have a fever.  You have a cough that does not go away (is persistent).  You start to make whistling sounds when you breathe (wheeze).  Your symptoms do not get better with treatment.  You have thick fluid coming from your nose.  You start to have nosebleeds. Get help right away if:  Your tongue or your lips are swollen.  You have trouble breathing.  You feel dizzy or you feel like you are going to pass out (faint).  You have cold sweats. Summary  Allergic rhinitis is a reaction to allergens in the air.  This condition may be caused by allergens. These include pollen, dust mites, pet dander, and mold.  Symptoms include a runny, itchy nose, sneezing, or tearing eyes. You may also have trouble sleeping or feel sleepy during the day.  Treatment includes taking medicines and  avoiding allergens. You may also get shots or take stronger medicines.  Get help if you have a fever or a cough that does not stop. Get help right away if you are short of breath. This information is not intended to replace advice given to you by your health  care provider. Make sure you discuss any questions you have with your health care provider. Document Released: 12/27/2010 Document Revised: 03/18/2018 Document Reviewed: 03/18/2018 Elsevier Interactive Patient Education  2019 [place].    CPAP and BPAP Information CPAP and BPAP are methods of helping a person breathe with the use of air pressure. CPAP stands for "continuous positive airway pressure." BPAP stands for "bi-level positive airway pressure." In both methods, air is blown through your nose or mouth and into your air passages to help you breathe well. CPAP and BPAP use different amounts of pressure to blow air. With CPAP, the amount of pressure stays the same while you breathe in and out. With BPAP, the amount of pressure is increased when you breathe in (inhale) so that you can take larger breaths. Your health care provider will recommend whether CPAP or BPAP would be more helpful for you. Why are CPAP and BPAP treatments used? CPAP or BPAP can be helpful if you have:  Sleep apnea.  Chronic obstructive pulmonary disease (COPD).  Heart failure.  Medical conditions that weaken the muscles of the chest including muscular dystrophy, or neurological diseases such as amyotrophic lateral sclerosis (ALS).  Other problems that cause breathing to be weak, abnormal, or difficult. CPAP is most commonly used for obstructive sleep apnea (OSA) to keep the airways from collapsing when the muscles relax during sleep. How is CPAP or BPAP administered? Both CPAP and BPAP are provided by a small machine with a flexible plastic tube that attaches to a plastic mask. You wear the mask. Air is blown through the mask into your nose or mouth. The  amount of pressure that is used to blow the air can be adjusted on the machine. Your health care provider will determine the pressure setting that should be used based on your individual needs. When should CPAP or BPAP be used? In most cases, the mask only needs to be worn during sleep. Generally, the mask needs to be worn throughout the night and during any daytime naps. People with certain medical conditions may also need to wear the mask at other times when they are awake. Follow instructions from your health care provider about when to use the machine. What are some tips for using the mask?   Because the mask needs to be snug, some people feel trapped or closed-in (claustrophobic) when first using the mask. If you feel this way, you may need to get used to the mask. One way to do this is by holding the mask loosely over your nose or mouth and then gradually applying the mask more snugly. You can also gradually increase the amount of time that you use the mask.  Masks are available in various types and sizes. Some fit over your mouth and nose while others fit over just your nose. If your mask does not fit well, talk with your health care provider about getting a different one.  If you are using a mask that fits over your nose and you tend to breathe through your mouth, a chin strap may be applied to help keep your mouth closed.  The CPAP and BPAP machines have alarms that may sound if the mask comes off or develops a leak.  If you have trouble with the mask, it is very important that you talk with your health care provider about finding a way to make the mask easier to tolerate. Do not stop using the mask. Stopping the use of the mask could have a  negative impact on your health. What are some tips for using the machine?  Place your CPAP or BPAP machine on a secure table or stand near an electrical outlet.  Know where the on/off switch is located on the machine.  Follow instructions from your  health care provider about how to set the pressure on your machine and when you should use it.  Do not eat or drink while the CPAP or BPAP machine is on. Food or fluids could get pushed into your lungs by the pressure of the CPAP or BPAP.  Do not smoke. Tobacco smoke residue can damage the machine.  For home use, CPAP and BPAP machines can be rented or purchased through home health care companies. Many different brands of machines are available. Renting a machine before purchasing may help you find out which particular machine works well for you.  Keep the CPAP or BPAP machine and attachments clean. Ask your health care provider for specific instructions. Get help right away if:  You have redness or open areas around your nose or mouth where the mask fits.  You have trouble using the CPAP or BPAP machine.  You cannot tolerate wearing the CPAP or BPAP mask.  You have pain, discomfort, and bloating in your abdomen. Summary  CPAP and BPAP are methods of helping a person breathe with the use of air pressure.  Both CPAP and BPAP are provided by a small machine with a flexible plastic tube that attaches to a plastic mask.  If you have trouble with the mask, it is very important that you talk with your health care provider about finding a way to make the mask easier to tolerate. This information is not intended to replace advice given to you by your health care provider. Make sure you discuss any questions you have with your health care provider. Document Released: 05/25/2004 Document Revised: 04/29/2018 Document Reviewed: 07/16/2016 Elsevier Interactive Patient Education  2019 [employer].

## 2019-03-02 NOTE — Assessment & Plan Note (Signed)
Assessment: Moderate obstructive sleep apnea in 2012 sleep study CPAP compliance report today shows excellent compliance  Plan: Continue CPAP therapy Follow-up in 1 year

## 2019-03-02 NOTE — Progress Notes (Signed)
@Patient  ID: Kayla [name], female    DOB: 12/09/1960, 58 y.o.   MRN: [id number]  Chief Complaint  Patient presents with  . Follow-up    doing well with cpap - increased nasal/throat drainage (clear) over past 6-8 months.      Referring provider: Isaac Bliss, [place]*  HPI:  58 year old female never smoker followed in our office for moderate obstructive sleep apnea, insomnia, asthma, allergic rhinitis  PMH: High blood pressure, depression, type 2 diabetes Smoker/ Smoking History: Never smoker Maintenance: Breo 100 Pt of: Dr. Annamaria Boots   03/02/2019  - Visit   58 year old female never smoker followed in our office for obstructive sleep apnea.  CPAP compliance report today shows excellent compliance.  See compliance for listed below:  01/31/2019-03/01/2019 20-29 out of last 30 days use, 26 of those days greater than 4 hours, average usage 8 hours and 1 minutes, APAP settings 5-15, AHI 3.1  Patient reporting she has had increased nasal drainage for the last 6-day months.  She reports she typically has worsened allergy symptoms in the spring.  She has no known environmental allergies to trees, grasses, dust mites, cat dander as well as a food allergy to shrimp.  Patient reports she is had increased nasal drainage with a cough with clear mucus.  Patient does not take a daily antihistamine, with the exception of Zquil at night.    Tests:   NPSG 01/30/11-AHI 19.9/hour, desaturation to 88%, CPAP titration to 9, body weight 230 pounds  Office Spirometry 01/04/17-mild restriction of exhaled volume. FVC 2.51/78%, FEV1 2.06/82%, ratio 0.82, FEF25-75% 2.27/95%  FENO:  Lab Results  Component Value Date   NITRICOXIDE 42 01/04/2017    PFT: No flowsheet data found.  Imaging: No results found.    Specialty Problems      Pulmonary Problems   Obstructive sleep apnea    NPSG 01/30/11- AHI 19.9/hr; CPAP 9 gave AHI 0/hr weight 230 lbs      Seasonal and perennial allergic rhinitis    Allergic asthma, mild intermittent, uncomplicated      Allergies  Allergen Reactions  . Morphine And Related Shortness Of Breath and Itching    Immunization History  Administered Date(s) Administered  . Influenza Split 08/03/2011, 05/20/2012, 06/10/2013  . Influenza Whole 08/01/2010  . Influenza,inj,Quad PF,6+ Mos 06/17/2014, 06/24/2015, 05/08/2017, 08/20/2018  . Influenza,inj,quad, With Preservative 06/14/2014  . Influenza-Unspecified 07/17/2016  . Pneumococcal Polysaccharide-23 06/06/2012  . Td 05/12/2009  . Zoster Recombinat (Shingrix) 08/20/2018, 11/19/2018    Past Medical History:  Diagnosis Date  . Carpal tunnel syndrome of right wrist 10/2012  . Complication of anesthesia    hard to wake up after surg. 06/04/2012 and CO2 went up to 78  . Depressive disorder, not elsewhere classified   . Exertional dyspnea    occasionally  . 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 Headache(784.0)    due to allergies  . Heart murmur    states no problems  . Hyperlipidemia   . Mitral valve prolapse   . OSA on CPAP   . Osteoarthritis    knees  . Overactive bladder   . PTSD (post-traumatic stress disorder)   . Type II diabetes mellitus (HCC)    NIDDM    Tobacco History: Social History   Tobacco Use  Smoking Status Never Smoker  Smokeless Tobacco Never Used   Counseling given: Not Answered   Continue to not smoke  Outpatient Encounter Medications  as of 03/02/2019  Medication Sig  . albuterol (PROVENTIL HFA) 108 (90 Base) MCG/ACT inhaler Inhale 2 puffs into the lungs every 6 (six) hours as needed for wheezing or shortness of breath.  Marland Kitchen atorvastatin (LIPITOR) 20 MG tablet Take 1 tablet (20 mg total) by mouth daily.  . empagliflozin (JARDIANCE) 25 MG TABS tablet Take 25 mg by mouth daily.  . fluticasone furoate-vilanterol (BREO ELLIPTA) 100-25 MCG/INH AEPB INHALE 1 PUFF BY MOUTH ONCE DAILY  . lisinopril  (PRINIVIL,ZESTRIL) 10 MG tablet Take 1 tablet (10 mg total) by mouth daily.  . Loperamide HCl (IMODIUM PO) Take 2 tablets by mouth 2 (two) times daily as needed.   Marland Kitchen LORazepam (ATIVAN) 0.5 MG tablet TAKE 1 TABLET BY MOUTH EVERY 8 HOURS AS NEEDED FOR ANXIETY  . meloxicam (MOBIC) 15 MG tablet Take 1 tablet (15 mg total) by mouth daily.  . metFORMIN (GLUCOPHAGE-XR) 500 MG 24 hr tablet TAKE 2 TABLETS BY MOUTH ONCE DAILY WITH BREAKFAST  . oxybutynin (DITROPAN XL) 15 MG 24 hr tablet Take 1 tablet (15 mg total) by mouth daily.  Marland Kitchen rOPINIRole (REQUIP) 0.25 MG tablet TAKE 1 TO 2 TABLETS BY MOUTH BEFORE BEDTIME FOR  RESTLESS  LEG  . sertraline (ZOLOFT) 100 MG tablet Take 1 tablet (100 mg total) by mouth daily.  Marland Kitchen venlafaxine (EFFEXOR) 75 MG tablet Take 1 tablet (75 mg total) by mouth daily.  . fluticasone furoate-vilanterol (BREO ELLIPTA) 100-25 MCG/INH AEPB Inhale 1 puff into the lungs daily for 1 day.  Marland Kitchen HYDROcodone-acetaminophen (NORCO) 5-325 MG tablet Take 1 tablet by mouth every 6 (six) hours as needed. (Patient not taking: Reported on 03/02/2019)   No facility-administered encounter medications on file as of 03/02/2019.      Review of Systems  Review of Systems  Constitutional: Negative for chills, fatigue, fever and unexpected weight change.  HENT: Positive for congestion and postnasal drip. Negative for ear pain, sinus pressure and sinus pain.   Respiratory: Positive for cough. Negative for chest tightness, shortness of breath and wheezing.   Cardiovascular: Negative for chest pain and palpitations.  Gastrointestinal: Negative for blood in stool, diarrhea, nausea and vomiting.  Genitourinary: Negative for dysuria, frequency and urgency.  Musculoskeletal: Negative for arthralgias.  Skin: Negative for color change.  Allergic/Immunologic: Positive for environmental allergies (trees, grasses, smoke, dust mites, cat dander) and food allergies (shrimp).  Neurological: Negative for dizziness,  light-headedness and headaches.  Psychiatric/Behavioral: Negative for dysphoric mood. The patient is not nervous/anxious.   All other systems reviewed and are negative.    Physical Exam  BP 108/64 (BP Location: Left Arm, Patient Position: Sitting, Cuff Size: Normal)   Pulse 83   Temp 98.1 F (36.7 C)   Ht 5\' 4"  (1.626 m)   Wt 216 lb 6.4 oz (98.2 kg)   SpO2 95%   BMI 37.14 kg/m   Wt Readings from Last 5 Encounters:  03/02/19 216 lb 6.4 oz (98.2 kg)  11/19/18 225 lb 14.4 oz (102.5 kg)  10/15/18 220 lb 4.8 oz (99.9 kg)  09/09/18 221 lb 12.8 oz (100.6 kg)  08/20/18 219 lb 6.4 oz (99.5 kg)     Physical Exam  Constitutional: She is oriented to person, place, and time and well-developed, well-nourished, and in no distress. No distress.  HENT:  Head: Normocephalic and atraumatic.  Right Ear: Hearing, tympanic membrane, external ear and ear canal normal.  Left Ear: Hearing, tympanic membrane, external ear and ear canal normal.  Nose: Mucosal edema and rhinorrhea present. Right  sinus exhibits no maxillary sinus tenderness and no frontal sinus tenderness. Left sinus exhibits no maxillary sinus tenderness and no frontal sinus tenderness.  Mouth/Throat: Uvula is midline and oropharynx is clear and moist. No oropharyngeal exudate.  PND  Eyes: Pupils are equal, round, and reactive to light.  Neck: Normal range of motion. Neck supple.  Cardiovascular: Normal rate, regular rhythm and normal heart sounds.  Pulmonary/Chest: Effort normal and breath sounds normal. No accessory muscle usage. No respiratory distress. She has no decreased breath sounds. She has no wheezes. She has no rhonchi. She has no rales.  Musculoskeletal: Normal range of motion.        General: No edema.  Lymphadenopathy:    She has no cervical adenopathy.  Neurological: She is alert and oriented to person, place, and time. Gait normal.  Skin: Skin is warm and dry. She is not diaphoretic. No erythema.  Psychiatric: Mood,  memory, affect and judgment normal.  Nursing note and vitals reviewed.     Lab Results:  CBC    Component Value Date/Time   WBC 8.0 01/04/2017 1113   RBC 5.09 01/04/2017 1113   HGB 14.8 01/04/2017 1113   HCT 43.8 01/04/2017 1113   PLT 273.0 01/04/2017 1113   MCV 86.0 01/04/2017 1113   MCH 27.8 06/06/2012 0722   MCHC 33.8 01/04/2017 1113   RDW 14.5 01/04/2017 1113   LYMPHSABS 2.6 01/04/2017 1113   MONOABS 0.7 01/04/2017 1113   EOSABS 0.5 01/04/2017 1113   BASOSABS 0.0 01/04/2017 1113    BMET    Component Value Date/Time   NA 139 10/30/2017 1137   K 4.3 10/30/2017 1137   CL 104 10/30/2017 1137   CO2 28 10/30/2017 1137   GLUCOSE 124 (H) 10/30/2017 1137   BUN 10 10/30/2017 1137   CREATININE 1.01 10/30/2017 1137   CALCIUM 9.5 10/30/2017 1137   GFRNONAA 75 (L) 10/20/2012 1610   GFRAA 87 (L) 10/20/2012 1610    BNP No results found for: BNP  ProBNP No results found for: PROBNP    Assessment & Plan:   Allergic asthma, mild intermittent, uncomplicated Plan: Continue Breo Ellipta 100 Start daily antihistamine Can use chlor tabs at night for postnasal drip Can start using Flonase as needed for nasal congestion  Seasonal and perennial allergic rhinitis Assessment: Rhinorrhea Increased postnasal drip for last 6-day 8 months Known environmental allergies  Plan: Start daily antihistamine Can use chlor tabs at night to help with postnasal drip and sleeping Stop ZzzQuil Continue nasal saline rinses Can use Flonase 1 spray each nostril daily for the next 7 days and then use as needed for nasal congestion allergy-like symptoms  Obstructive sleep apnea Assessment: Moderate obstructive sleep apnea in 2012 sleep study CPAP compliance report today shows excellent compliance  Plan: Continue CPAP therapy Follow-up in 1 year  INSOMNIA Plan: Okay to stop ZzzQuil Can continue to use melatonin Can start using chlor tabs to see if this helps    Return in about  1 year (around 03/01/2020), or if symptoms worsen or fail to improve, for Follow up with Dr. Annamaria Boots, Follow up with Wyn Quaker FNP-C.   Lauraine Rinne, NP 03/02/2019   This appointment was 27 minutes long with over 50% of the time in direct face-to-face patient care, assessment, plan of care, and follow-up.

## 2019-03-02 NOTE — Assessment & Plan Note (Signed)
Plan: Continue Breo Ellipta 100 Start daily antihistamine Can use chlor tabs at night for postnasal drip Can start using Flonase as needed for nasal congestion

## 2019-03-02 NOTE — Assessment & Plan Note (Signed)
Assessment: Rhinorrhea Increased postnasal drip for last 6-day 8 months Known environmental allergies  Plan: Start daily antihistamine Can use chlor tabs at night to help with postnasal drip and sleeping Stop ZzzQuil Continue nasal saline rinses Can use Flonase 1 spray each nostril daily for the next 7 days and then use as needed for nasal congestion allergy-like symptoms

## 2019-03-02 NOTE — Assessment & Plan Note (Signed)
Plan: Okay to stop ZzzQuil Can continue to use melatonin Can start using chlor tabs to see if this helps

## 2019-03-31 DIAGNOSIS — Z1231 Encounter for screening mammogram for malignant neoplasm of breast: Secondary | ICD-10-CM | POA: Diagnosis not present

## 2019-03-31 DIAGNOSIS — Z01419 Encounter for gynecological examination (general) (routine) without abnormal findings: Secondary | ICD-10-CM | POA: Diagnosis not present

## 2019-03-31 DIAGNOSIS — Z6839 Body mass index (BMI) 39.0-39.9, adult: Secondary | ICD-10-CM | POA: Diagnosis not present

## 2019-03-31 LAB — HM MAMMOGRAPHY

## 2019-04-10 ENCOUNTER — Encounter: Payer: Self-pay | Admitting: Internal Medicine

## 2019-04-17 ENCOUNTER — Encounter: Payer: Self-pay | Admitting: Internal Medicine

## 2019-04-17 ENCOUNTER — Other Ambulatory Visit: Payer: Self-pay

## 2019-04-17 ENCOUNTER — Telehealth (INDEPENDENT_AMBULATORY_CARE_PROVIDER_SITE_OTHER): Payer: Managed Care, Other (non HMO) | Admitting: Internal Medicine

## 2019-04-17 DIAGNOSIS — N3000 Acute cystitis without hematuria: Secondary | ICD-10-CM | POA: Diagnosis not present

## 2019-04-17 DIAGNOSIS — R300 Dysuria: Secondary | ICD-10-CM

## 2019-04-17 LAB — POCT URINALYSIS DIPSTICK
Bilirubin, UA: NEGATIVE
Glucose, UA: POSITIVE — AB
Ketones, UA: NEGATIVE
Nitrite, UA: NEGATIVE
Odor: NEGATIVE
Protein, UA: NEGATIVE
Spec Grav, UA: <=1.005 — AB (ref 1.010–1.025)
Urobilinogen, UA: 0.2 E.U./dL
pH, UA: 6.5 (ref 5.0–8.0)

## 2019-04-17 LAB — URINALYSIS, ROUTINE W REFLEX MICROSCOPIC
Bilirubin Urine: NEGATIVE
Ketones, ur: NEGATIVE
Nitrite: NEGATIVE
Specific Gravity, Urine: <=1.005 — AB (ref 1.000–1.030)
Total Protein, Urine: NEGATIVE
Urine Glucose: >=1000 — AB
Urobilinogen, UA: 0.2 (ref 0.0–1.0)
pH: 6.5 (ref 5.0–8.0)

## 2019-04-17 MED ORDER — SULFAMETHOXAZOLE-TRIMETHOPRIM 800-160 MG PO TABS: 1.0000 | ORAL_TABLET | Freq: Two times a day (BID) | ORAL | 0 refills | Status: AC

## 2019-04-17 NOTE — Progress Notes (Signed)
Virtual Visit via Video Note  I connected with Kayla [name] on 04/17/19 at  3:30 PM EDT by a video enabled telemedicine application and verified that I am speaking with the correct person using two identifiers.  Location patient: home Location provider: work office Persons participating in the virtual visit: patient, provider  I discussed the limitations of evaluation and management by telemedicine and the availability of in person appointments. The patient expressed understanding and agreed to proceed.   HPI: Acute visit for ?UTI.  Past 2 weeks she has been at the beach and the pool. 4 days ago started having dysuria, frequency and urgency. She dropped off a urine earlier today. Dipstick shows moderate leukocytes.   ROS: Constitutional: Denies fever, chills, diaphoresis, appetite change and fatigue.  HEENT: Denies photophobia, eye pain, redness, hearing loss, ear pain, congestion, sore throat, rhinorrhea, sneezing, mouth sores, trouble swallowing, neck pain, neck stiffness and tinnitus.   Respiratory: Denies SOB, DOE, cough, chest tightness,  and wheezing.   Cardiovascular: Denies chest pain, palpitations and leg swelling.  Gastrointestinal: Denies nausea, vomiting, abdominal pain, diarrhea, constipation, blood in stool and abdominal distention.  Genitourinary: Denies dysuria, urgency, frequency, hematuria, flank pain and difficulty urinating.  Endocrine: Denies: hot or cold intolerance, sweats, changes in hair or nails, polyuria, polydipsia. Musculoskeletal: Denies myalgias, back pain, joint swelling, arthralgias and gait problem.  Skin: Denies pallor, rash and wound.  Neurological: Denies dizziness, seizures, syncope, weakness, light-headedness, numbness and headaches.  Hematological: Denies adenopathy. Easy bruising, personal or family bleeding history  Psychiatric/Behavioral: Denies suicidal ideation, mood changes, confusion, nervousness, sleep disturbance and  agitation   Past Medical History:  Diagnosis Date  . Carpal tunnel syndrome of right wrist 10/2012  . Complication of anesthesia    hard to wake up after surg. 06/04/2012 and CO2 went up to 78  . Depressive disorder, not elsewhere classified   . Exertional dyspnea    occasionally  . 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 Headache(784.0)    due to allergies  . Heart murmur    states no problems  . Hyperlipidemia   . Mitral valve prolapse   . OSA on CPAP   . Osteoarthritis    knees  . Overactive bladder   . PTSD (post-traumatic stress disorder)   . Type II diabetes mellitus (HCC)    NIDDM    Past Surgical History:  Procedure Laterality Date  . BREAST BIOPSY Left 04/25/2001  . CARPAL TUNNEL RELEASE Right 10/24/2012   Procedure: CARPAL TUNNEL RELEASE;  Surgeon: Ninetta Lights, MD;  Location: [place];  Service: Orthopedics;  Laterality: Right;  RIGHT CARPAL TUNNEL RELEASE  . KNEE ARTHROSCOPY Right 04/01/2008  . KNEE ARTHROSCOPY W/ MENISCECTOMY Left 08/16/2011   partial medial  . LASIK     bilateral  . PUBOVAGINAL SLING  08/27/2001   cysto; suprapubic tube placement  . SHOULDER ARTHROSCOPY W/ ROTATOR CUFF REPAIR Left 2004  . TOTAL ABDOMINAL HYSTERECTOMY  08/27/2001  . TOTAL KNEE ARTHROPLASTY  02/06/2012   Procedure: TOTAL KNEE ARTHROPLASTY;  Surgeon: Ninetta Lights, MD;  Location: [place];  Service: Orthopedics;  Laterality: Right;  total knee right side  . TOTAL KNEE ARTHROPLASTY  06/04/2012   Procedure: TOTAL KNEE ARTHROPLASTY;  Surgeon: Ninetta Lights, MD;  Location: [place];  Service: Orthopedics;  Laterality: Left;  . ULNAR COLLATERAL LIGAMENT REPAIR Left 11/08/2017   Procedure: REPAIR/RECONSTRUCTION LEFT THUMB  ULNAR COLLATERAL LIGAMENT;  Surgeon: Daryll Brod, MD;  Location: [place];  Service: Orthopedics;  Laterality: Left;  Marland Kitchen VAGINAL HYSTERECTOMY  1980's    Family History   Problem Relation Age of Onset  . Heart disease Father   . COPD Mother   . Heart disease Mother   . Heart attack Brother   . Colon cancer Neg Hx     SOCIAL HX:   reports that she has never smoked. She has never used smokeless tobacco. She reports that she does not drink alcohol or use drugs.   Current Outpatient Medications:  .  albuterol (PROVENTIL HFA) 108 (90 Base) MCG/ACT inhaler, Inhale 2 puffs into the lungs every 6 (six) hours as needed for wheezing or shortness of breath., Disp: 18 g, Rfl: 3 .  atorvastatin (LIPITOR) 20 MG tablet, Take 1 tablet (20 mg total) by mouth daily., Disp: 90 tablet, Rfl: 1 .  empagliflozin (JARDIANCE) 25 MG TABS tablet, Take 25 mg by mouth daily., Disp: 90 tablet, Rfl: 6 .  fluticasone furoate-vilanterol (BREO ELLIPTA) 100-25 MCG/INH AEPB, INHALE 1 PUFF BY MOUTH ONCE DAILY, Disp: 60 each, Rfl: 3 .  HYDROcodone-acetaminophen (NORCO) 5-325 MG tablet, Take 1 tablet by mouth every 6 (six) hours as needed. (Patient not taking: Reported on 03/02/2019), Disp: 20 tablet, Rfl: 0 .  lisinopril (PRINIVIL,ZESTRIL) 10 MG tablet, Take 1 tablet (10 mg total) by mouth daily., Disp: 90 tablet, Rfl: 3 .  Loperamide HCl (IMODIUM PO), Take 2 tablets by mouth 2 (two) times daily as needed. , Disp: , Rfl:  .  LORazepam (ATIVAN) 0.5 MG tablet, TAKE 1 TABLET BY MOUTH EVERY 8 HOURS AS NEEDED FOR ANXIETY, Disp: 60 tablet, Rfl: 5 .  meloxicam (MOBIC) 15 MG tablet, Take 1 tablet (15 mg total) by mouth daily., Disp: 90 tablet, Rfl: 0 .  metFORMIN (GLUCOPHAGE-XR) 500 MG 24 hr tablet, TAKE 2 TABLETS BY MOUTH ONCE DAILY WITH BREAKFAST, Disp: 180 tablet, Rfl: 11 .  oxybutynin (DITROPAN XL) 15 MG 24 hr tablet, Take 1 tablet (15 mg total) by mouth daily., Disp: 90 tablet, Rfl: 1 .  rOPINIRole (REQUIP) 0.25 MG tablet, TAKE 1 TO 2 TABLETS BY MOUTH BEFORE BEDTIME FOR  RESTLESS  LEG, Disp: 30 tablet, Rfl: 5 .  sertraline (ZOLOFT) 100 MG tablet, Take 1 tablet (100 mg total) by mouth daily., Disp:  90 tablet, Rfl: 3 .  venlafaxine (EFFEXOR) 75 MG tablet, Take 1 tablet (75 mg total) by mouth daily., Disp: 90 tablet, Rfl: 3  EXAM:   VITALS per patient if applicable: none reported  GENERAL: alert, oriented, appears well and in no acute distress  HEENT: atraumatic, conjunttiva clear, no obvious abnormalities on inspection of external nose and ears, wears corrective lenses  NECK: normal movements of the head and neck  LUNGS: on inspection no signs of respiratory distress, breathing rate appears normal, no obvious gross increased work of breathing, gasping or wheezing  CV: no obvious cyanosis  MS: moves all visible extremities without noticeable abnormality  PSYCH/NEURO: pleasant and cooperative, no obvious depression or anxiety, speech and thought processing grossly intact  ASSESSMENT AND PLAN:   Acute cystitis without hematuria  -Dipstick seems indicative of a UTI. -Will send for formal UA and cx. -Will send Rx for Bactrim DS for 5 days. -She will RTC if no improvement at that point.     I discussed the assessment and treatment plan with the patient. The patient was provided an opportunity to ask questions and all  were answered. The patient agreed with the plan and demonstrated an understanding of the instructions.   The patient was advised to call back or seek an in-person evaluation if the symptoms worsen or if the condition fails to improve as anticipated.    Lelon Frohlich, MD  [facility]

## 2019-04-19 LAB — URINE CULTURE
MICRO NUMBER:: 749502
SPECIMEN QUALITY:: ADEQUATE

## 2019-04-21 ENCOUNTER — Other Ambulatory Visit: Payer: Self-pay | Admitting: Internal Medicine

## 2019-04-21 DIAGNOSIS — N3000 Acute cystitis without hematuria: Secondary | ICD-10-CM

## 2019-04-21 MED ORDER — CIPROFLOXACIN HCL 500 MG PO TABS: 500.0000 mg | ORAL_TABLET | Freq: Two times a day (BID) | ORAL | 0 refills | Status: AC

## 2019-06-01 ENCOUNTER — Other Ambulatory Visit: Payer: Self-pay | Admitting: Internal Medicine

## 2019-06-04 ENCOUNTER — Other Ambulatory Visit: Payer: Self-pay | Admitting: *Deleted

## 2019-06-04 NOTE — Telephone Encounter (Signed)
Last office visit 04/21/2019

## 2019-06-08 ENCOUNTER — Other Ambulatory Visit: Payer: Self-pay | Admitting: Internal Medicine

## 2019-06-09 MED ORDER — MELOXICAM 15 MG PO TABS: 15.0000 mg | ORAL_TABLET | Freq: Every day | ORAL | 0 refills | Status: DC

## 2019-06-15 DIAGNOSIS — M25552 Pain in left hip: Secondary | ICD-10-CM | POA: Diagnosis not present

## 2019-06-30 ENCOUNTER — Encounter: Payer: Self-pay | Admitting: Internal Medicine

## 2019-06-30 MED ORDER — METFORMIN HCL ER 500 MG PO TB24: ORAL_TABLET | ORAL | 1 refills | Status: DC

## 2019-06-30 MED ORDER — JARDIANCE 25 MG PO TABS: 25.0000 mg | ORAL_TABLET | Freq: Every day | ORAL | 1 refills | Status: DC

## 2019-07-07 DIAGNOSIS — M79674 Pain in right toe(s): Secondary | ICD-10-CM | POA: Diagnosis not present

## 2019-07-07 DIAGNOSIS — L600 Ingrowing nail: Secondary | ICD-10-CM | POA: Diagnosis not present

## 2019-07-07 DIAGNOSIS — M19071 Primary osteoarthritis, right ankle and foot: Secondary | ICD-10-CM | POA: Diagnosis not present

## 2019-07-15 ENCOUNTER — Other Ambulatory Visit: Payer: Self-pay | Admitting: *Deleted

## 2019-07-15 MED ORDER — SERTRALINE HCL 100 MG PO TABS: 100.0000 mg | ORAL_TABLET | Freq: Every day | ORAL | 1 refills | Status: DC

## 2019-07-15 NOTE — Telephone Encounter (Signed)
Refill Request sertraline (ZOLOFT) 100 MG tablet  [facility] [place], [place]  Last refill 05/06/18 Last office visit 04/17/2019  Okay to fill?

## 2019-07-22 ENCOUNTER — Other Ambulatory Visit: Payer: Self-pay | Admitting: Internal Medicine

## 2019-07-24 ENCOUNTER — Other Ambulatory Visit: Payer: Self-pay | Admitting: Internal Medicine

## 2019-08-25 ENCOUNTER — Other Ambulatory Visit: Payer: Self-pay | Admitting: Internal Medicine

## 2019-09-10 ENCOUNTER — Ambulatory Visit: Payer: Managed Care, Other (non HMO) | Admitting: Internal Medicine

## 2019-09-19 ENCOUNTER — Other Ambulatory Visit: Payer: Self-pay | Admitting: Internal Medicine

## 2019-09-29 ENCOUNTER — Encounter: Payer: Self-pay | Admitting: Internal Medicine

## 2019-09-30 ENCOUNTER — Encounter: Payer: Self-pay | Admitting: Internal Medicine

## 2019-10-20 ENCOUNTER — Other Ambulatory Visit: Payer: Self-pay | Admitting: Internal Medicine

## 2019-11-03 ENCOUNTER — Other Ambulatory Visit: Payer: Self-pay | Admitting: Internal Medicine

## 2019-12-02 ENCOUNTER — Other Ambulatory Visit: Payer: Self-pay | Admitting: Internal Medicine

## 2019-12-02 DIAGNOSIS — I10 Essential (primary) hypertension: Secondary | ICD-10-CM

## 2019-12-02 DIAGNOSIS — N951 Menopausal and female climacteric states: Secondary | ICD-10-CM

## 2020-01-11 ENCOUNTER — Other Ambulatory Visit: Payer: Self-pay | Admitting: Internal Medicine

## 2020-02-17 ENCOUNTER — Other Ambulatory Visit: Payer: Self-pay | Admitting: Internal Medicine

## 2020-02-17 DIAGNOSIS — I10 Essential (primary) hypertension: Secondary | ICD-10-CM

## 2020-02-18 ENCOUNTER — Other Ambulatory Visit: Payer: Self-pay | Admitting: *Deleted

## 2020-02-18 DIAGNOSIS — I10 Essential (primary) hypertension: Secondary | ICD-10-CM

## 2020-02-18 MED ORDER — LISINOPRIL 10 MG PO TABS: 10.0000 mg | ORAL_TABLET | Freq: Every day | ORAL | 0 refills | Status: DC

## 2020-02-19 ENCOUNTER — Other Ambulatory Visit: Payer: Self-pay | Admitting: Internal Medicine

## 2020-02-19 DIAGNOSIS — I10 Essential (primary) hypertension: Secondary | ICD-10-CM

## 2020-03-01 ENCOUNTER — Other Ambulatory Visit: Payer: Self-pay

## 2020-03-01 ENCOUNTER — Encounter: Payer: Self-pay | Admitting: Internal Medicine

## 2020-03-01 ENCOUNTER — Ambulatory Visit (INDEPENDENT_AMBULATORY_CARE_PROVIDER_SITE_OTHER): Payer: Managed Care, Other (non HMO) | Admitting: Internal Medicine

## 2020-03-01 VITALS — BP 120/80 | HR 92 | Temp 98.3°F | Ht 63.0 in | Wt 205.0 lb

## 2020-03-01 DIAGNOSIS — G2581 Restless legs syndrome: Secondary | ICD-10-CM

## 2020-03-01 DIAGNOSIS — G4733 Obstructive sleep apnea (adult) (pediatric): Secondary | ICD-10-CM

## 2020-03-01 DIAGNOSIS — J4520 Mild intermittent asthma, uncomplicated: Secondary | ICD-10-CM | POA: Diagnosis not present

## 2020-03-01 MED ORDER — BREO ELLIPTA 100-25 MCG/INH IN AEPB: 1.0000 | INHALATION_SPRAY | Freq: Every day | RESPIRATORY_TRACT | 0 refills | Status: DC

## 2020-03-01 MED ORDER — BUDESONIDE-FORMOTEROL FUMARATE 160-4.5 MCG/ACT IN AERO: INHALATION_SPRAY | RESPIRATORY_TRACT | 12 refills | Status: DC

## 2020-03-01 MED ORDER — ROPINIROLE HCL 0.25 MG PO TABS: ORAL_TABLET | ORAL | 12 refills | Status: DC

## 2020-03-01 NOTE — Patient Instructions (Signed)
Sample x 2 Breo 100 if available  Script printed for Symbicort 160 maintenance inhaler. Inhale 2 puffs then rinse mouth, twice daily. Try this instead of Breo if it is cheaper.    Script sent refilling Requip for restless legs  We can continue CPAP auto 5-15, mask of choice humidifier, supplies, airView/ card  Please call if we can help

## 2020-03-01 NOTE — Assessment & Plan Note (Signed)
Requip has been adequate with occasional use. Plan- refill with discussion

## 2020-03-01 NOTE — Assessment & Plan Note (Signed)
Continues to be mild, intermittent uncomplicated. Plan- Script for Symbicort to see if it can be cheaper for her than Breo.

## 2020-03-01 NOTE — Assessment & Plan Note (Signed)
Benefits from CPAP with good compliance and control Plan- continue auto 5-15 

## 2020-03-01 NOTE — Progress Notes (Signed)
Subjective:    Patient ID: Kayla [name], female    DOB: Apr 19, 1961, 59 y.o.   MRN: [id number]  HPI female never smoker followed for OSA/Insomnia, Asthma, allergic rhinitis complicated by HBP, depression, DM NPSG 01/30/11-AHI 19.9/hour, desaturation to 88%, CPAP titration to 9, body weight 230 pounds FENO 01/04/17- 42 H Office Spirometry 01/04/17-mild restriction of exhaled volume. FVC 2.51/78%, FEV1 2.06/82%, ratio 0.82, FEF25-75% 2.27/95% -------------------------------------------------------------------------------------------   09/09/2018- 59 yo female never smoker followed for OSA/Insomnia, Asthma, complicated by HBP, depression, DM2 CPAP auto 5-15/ [employer] Download 90% compliance AHI 2.0/hour States her cpap is working well but she is waking up with dry nasal passages but no sore throat.  Albuterol HFA, Breo 100 And recent increased asthma.  Using rescue inhaler about once daily.  Feels a little tighter through chest.  No fever, sore throat or discolored sputum.  03/01/20- 59 yo female never smoker followed for OSA/Insomnia, Restless Legs, Asthma, Allergic Rhinitis,  complicated by HBP, depression, DM2, CPAP auto 5-15/ [employer] Download compliance 87%, AHI 2.1/ hr Body weight today - 205 lbs Albuterol hfa, Breo 100, Requip 0.25 ------needs restless leg med. Needs Breo sample. Using albuterol rescue about 1x/ week now in humid weather, otherwise less with no significant episodes. Memory Dance is expensive. Discussed trial Symbicort as alternative. Using Requip 1 tab, 1-3x/ week at bedtime. Doing fine with CPAP- no concerns. Has 2 Moderna covax.  ROS-see HPI            + = positive Constitutional:   No-   weight loss, night sweats, fevers, chills, fatigue, lassitude. HEENT:   No-  headaches, difficulty swallowing, tooth/dental problems, sore throat,       No-  sneezing, itching, ear ache, +nasal congestion, post nasal drip,  CV:  No-   chest pain, orthopnea, PND, swelling in lower  extremities, anasarca,  dizziness, palpitations Resp:  No hemoptysis  Skin: No-   rash or lesions. GI:  No-   heartburn, indigestion, abdominal pain, nausea, vomiting,  GU: . MS:  No-   joint pain or swelling.  . Neuro-     nothing unusual Psych:  No- change in mood or affect. No depression or anxiety.  No memory loss.  OBJ- Physical Exam General- Alert, Oriented, Affect-appropriate, Distress- none acute, + overweight Skin- rash-none, lesions- none, excoriation- none Lymphadenopathy- none Head- atraumatic            Eyes- Gross vision intact, PERRLA, conjunctivae and secretions clear            Ears- Hearing, canals-normal            Nose- Clear, no-Septal dev, mucus, polyps, erosion, perforation             Throat- Mallampati III , mucosa clear , drainage- none, tonsils- atrophic Neck- flexible , trachea midline, no stridor , thyroid nl, carotid no bruit Chest - symmetrical excursion , unlabored           Heart/CV- RRR , no murmur , no gallop  , no rub, nl s1 s2                           - JVD- none , edema- none, stasis changes- none, varices- none           Lung- clear to P&A, wheeze- none, cough- none , dullness-none, rub- none           Chest wall-  Abd-  Br/ Gen/ Rectal- Not done,  not indicated Extrem- cyanosis- none, clubbing, none, atrophy- none, strength- nl Neuro- grossly intact to observation

## 2020-03-24 ENCOUNTER — Ambulatory Visit
Admission: EM | Admit: 2020-03-24 | Discharge: 2020-03-24 | Disposition: A | Discharge status: Home / Self Care | Payer: Managed Care, Other (non HMO) | Attending: Emergency Medicine | Admitting: Emergency Medicine

## 2020-03-24 ENCOUNTER — Encounter: Payer: Self-pay | Admitting: Emergency Medicine

## 2020-03-24 DIAGNOSIS — J029 Acute pharyngitis, unspecified: Secondary | ICD-10-CM

## 2020-03-24 DIAGNOSIS — H66001 Acute suppurative otitis media without spontaneous rupture of ear drum, right ear: Secondary | ICD-10-CM | POA: Insufficient documentation

## 2020-03-24 LAB — POCT RAPID STREP A (OFFICE): Rapid Strep A Screen: NEGATIVE

## 2020-03-24 MED ORDER — AMOXICILLIN 500 MG PO CAPS: 500.0000 mg | ORAL_CAPSULE | Freq: Two times a day (BID) | ORAL | 0 refills | Status: AC

## 2020-03-24 NOTE — ED Triage Notes (Signed)
Sore throat x 3-4 days. Daughter just tested positive for strep a week ago.

## 2020-03-24 NOTE — Discharge Instructions (Signed)
Strep test negative, will send out for culture and we will call you with results Get plenty of rest and push fluids Amoxcillin prescribed.  Take as directed and to completion for ear infection Drink warm or cool liquids, use throat lozenges, or popsicles to help alleviate symptoms Take OTC ibuprofen or tylenol as needed for pain Follow up with PCP if symptoms persists Return or go to ER if patient has any new or worsening symptoms such as fever, chills, nausea, vomiting, worsening sore throat, cough, abdominal pain, chest pain, changes in bowel or bladder habits, etc..Marland Kitchen

## 2020-03-24 NOTE — ED Provider Notes (Signed)
[facility]   [id number] 03/24/20 Arrival Time: 1104  FG:HWEX THROAT  SUBJECTIVE: History from: family.  Kayla [name] is a 59 y.o. female who presents with RT ear pain, sore throat, and cough x 3-4 days.  Admits to sick exposure to strep.  Has tried OTC medications without relief.  Symptoms are made worse with swallowing, but tolerating liquids and own secretions without difficulty.  Reports previous symptoms in the past with strep.   Denies fever, chills, fatigue, ear pain, sinus pain, rhinorrhea, nasal congestion, SOB, wheezing, chest pain, nausea, rash, changes in bowel or bladder habits.    ROS: As per HPI.  All other pertinent ROS negative.     Past Medical History:  Diagnosis Date  . Carpal tunnel syndrome of right wrist 10/2012  . Complication of anesthesia    hard to wake up after surg. 06/04/2012 and CO2 went up to 78  . Depressive disorder, not elsewhere classified   . Exertional dyspnea    occasionally  . 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 Headache(784.0)    due to allergies  . Heart murmur    states no problems  . Hyperlipidemia   . Mitral valve prolapse   . OSA on CPAP   . Osteoarthritis    knees  . Overactive bladder   . PTSD (post-traumatic stress disorder)   . Type II diabetes mellitus (HCC)    NIDDM   Past Surgical History:  Procedure Laterality Date  . BREAST BIOPSY Left 04/25/2001  . CARPAL TUNNEL RELEASE Right 10/24/2012   Procedure: CARPAL TUNNEL RELEASE;  Surgeon: Ninetta Lights, MD;  Location: [facility];  Service: Orthopedics;  Laterality: Right;  RIGHT CARPAL TUNNEL RELEASE  . KNEE ARTHROSCOPY Right 04/01/2008  . KNEE ARTHROSCOPY W/ MENISCECTOMY Left 08/16/2011   partial medial  . LASIK     bilateral  . PUBOVAGINAL SLING  08/27/2001   cysto; suprapubic tube placement  . SHOULDER ARTHROSCOPY W/ ROTATOR CUFF REPAIR Left 2004  . TOTAL ABDOMINAL  HYSTERECTOMY  08/27/2001  . TOTAL KNEE ARTHROPLASTY  02/06/2012   Procedure: TOTAL KNEE ARTHROPLASTY;  Surgeon: Ninetta Lights, MD;  Location: [facility];  Service: Orthopedics;  Laterality: Right;  total knee right side  . TOTAL KNEE ARTHROPLASTY  06/04/2012   Procedure: TOTAL KNEE ARTHROPLASTY;  Surgeon: Ninetta Lights, MD;  Location: [facility];  Service: Orthopedics;  Laterality: Left;  . ULNAR COLLATERAL LIGAMENT REPAIR Left 11/08/2017   Procedure: REPAIR/RECONSTRUCTION LEFT THUMB ULNAR COLLATERAL LIGAMENT;  Surgeon: Daryll Brod, MD;  Location: [facility];  Service: Orthopedics;  Laterality: Left;  Marland Kitchen VAGINAL HYSTERECTOMY  1980's   Allergies  Allergen Reactions  . Morphine And Related Shortness Of Breath and Itching   No current facility-administered medications on file prior to encounter.   Current Outpatient Medications on File Prior to Encounter  Medication Sig Dispense Refill  . albuterol (PROVENTIL HFA) 108 (90 Base) MCG/ACT inhaler Inhale 2 puffs into the lungs every 6 (six) hours as needed for wheezing or shortness of breath. 18 g 3  . atorvastatin (LIPITOR) 20 MG tablet Take 1 tablet by mouth once daily 30 tablet 0  . BREO ELLIPTA 100-25 MCG/INH AEPB Inhale 1 puff by mouth once daily 60 each 0  . budesonide-formoterol (SYMBICORT) 160-4.5 MCG/ACT inhaler Inhale 2 puffs then rinse mouth, twice daily 1 Inhaler 12  . fluticasone furoate-vilanterol (BREO ELLIPTA) 100-25 MCG/INH AEPB  Inhale 1 puff into the lungs daily. 2 each 0  . JARDIANCE 25 MG TABS tablet Take 1 tablet by mouth once daily 30 tablet 0  . lisinopril (ZESTRIL) 10 MG tablet Take 1 tablet (10 mg total) by mouth daily. 30 tablet 0  . Loperamide HCl (IMODIUM PO) Take 2 tablets by mouth 2 (two) times daily as needed.     Marland Kitchen LORazepam (ATIVAN) 0.5 MG tablet TAKE 1 TABLET BY MOUTH EVERY 8 HOURS AS NEEDED FOR ANXIETY 60 tablet 5  . meloxicam (MOBIC) 15 MG tablet Take 1 tablet by mouth once daily 90 tablet 0  . metFORMIN  (GLUCOPHAGE-XR) 500 MG 24 hr tablet TAKE 2 TABLETS BY MOUTH ONCE DAILY WITH BREAKFAST 180 tablet 0  . oxybutynin (DITROPAN XL) 15 MG 24 hr tablet Take 1 tablet by mouth once daily 30 tablet 0  . rOPINIRole (REQUIP) 0.25 MG tablet TAKE 1 TO 2 TABLETS BY MOUTH BEFORE BEDTIME FOR  RESTLESS  LEG 30 tablet 12  . sertraline (ZOLOFT) 100 MG tablet Take 1 tablet by mouth once daily 30 tablet 0  . venlafaxine (EFFEXOR) 75 MG tablet Take 1 tablet by mouth once daily 90 tablet 0   Social History   Socioeconomic History  . Marital status: Married    Spouse name: Not on file  . Number of children: 2  . Years of education: Not on file  . Highest education level: Not on file  Occupational History  . Occupation: Curator  Tobacco Use  . Smoking status: Never Smoker  . Smokeless tobacco: Never Used  Vaping Use  . Vaping Use: Never used  Substance and Sexual Activity  . Alcohol use: No  . Drug use: No  . Sexual activity: Yes  Other Topics Concern  . Not on file  Social History Narrative  . Not on file   Social Determinants of Health   Financial Resource Strain:   . Difficulty of Paying Living Expenses:   Food Insecurity:   . Worried About Charity fundraiser in the Last Year:   . Arboriculturist in the Last Year:   Transportation Needs:   . Film/video editor (Medical):   Marland Kitchen Lack of Transportation (Non-Medical):   Physical Activity:   . Days of Exercise per Week:   . Minutes of Exercise per Session:   Stress:   . Feeling of Stress :   Social Connections:   . Frequency of Communication with Friends and Family:   . Frequency of Social Gatherings with Friends and Family:   . Attends Religious Services:   . Active Member of Clubs or Organizations:   . Attends Archivist Meetings:   Marland Kitchen Marital Status:   Intimate Partner Violence:   . Fear of Current or Ex-Partner:   . Emotionally Abused:   Marland Kitchen Physically Abused:   . Sexually Abused:    Family History  Problem  Relation Age of Onset  . Heart disease Father   . COPD Mother   . Heart disease Mother   . Heart attack Brother   . Colon cancer Neg Hx     OBJECTIVE:  Vitals:   03/24/20 1106 03/24/20 1107  BP:  118/71  Pulse:  73  Resp:  18  Temp:  98.6 F (37 C)  TempSrc:  Oral  SpO2:  96%  Weight: 190 lb (86.2 kg)   Height: 5\' 3"  (1.6 m)      General appearance: alert; well-appearing, nontoxic; speaking in  full sentences and tolerating own secretions HEENT: NCAT; Ears: EACs clear, LT TM pearly gray, RT TM mildly erythematous; Eyes: PERRL.  EOM grossly intact. Nose: nares patent without rhinorrhea, Throat: oropharynx clear, tonsils non erythematous or enlarged, uvula midline  Neck: supple without LAD Lungs: unlabored respirations, symmetrical air entry; cough: absent; no respiratory distress; CTAB Heart: regular rate and rhythm.  Skin: warm and dry Psychological: alert and cooperative; normal mood and affect   LABS: Results for orders placed or performed during the hospital encounter of 03/24/20 (from the past 24 hour(s))  POCT rapid strep A     Status: None   Collection Time: 03/24/20 11:12 AM  Result Value Ref Range   Rapid Strep A Screen Negative Negative     ASSESSMENT & PLAN:  1. Non-recurrent acute suppurative otitis media of right ear without spontaneous rupture of tympanic membrane   2. Sore throat     Meds ordered this encounter  Medications  . amoxicillin (AMOXIL) 500 MG capsule    Sig: Take 1 capsule (500 mg total) by mouth 2 (two) times daily for 10 days.    Dispense:  20 capsule    Refill:  0    Order Specific Question:   Supervising Provider    Answer:   Raylene Everts [[id number]]   Strep test negative, will send out for culture and we will call you with results Get plenty of rest and push fluids Amoxcillin prescribed.  Take as directed and to completion for ear infection Drink warm or cool liquids, use throat lozenges, or popsicles to help alleviate  symptoms Take OTC ibuprofen or tylenol as needed for pain Follow up with PCP if symptoms persists Return or go to ER if patient has any new or worsening symptoms such as fever, chills, nausea, vomiting, worsening sore throat, cough, abdominal pain, chest pain, changes in bowel or bladder habits, etc...  Reviewed expectations re: course of current medical issues. Questions answered. Outlined signs and symptoms indicating need for more acute intervention. Patient verbalized understanding. After Visit Summary given.        Lestine Box, PA-C 03/24/20 1138

## 2020-03-25 ENCOUNTER — Other Ambulatory Visit: Payer: Self-pay | Admitting: Internal Medicine

## 2020-03-27 LAB — CULTURE, GROUP A STREP (THRC)

## 2020-04-01 ENCOUNTER — Other Ambulatory Visit: Payer: Self-pay | Admitting: Internal Medicine

## 2020-04-01 DIAGNOSIS — N951 Menopausal and female climacteric states: Secondary | ICD-10-CM

## 2020-04-26 LAB — HM MAMMOGRAPHY

## 2020-04-28 ENCOUNTER — Encounter: Payer: Self-pay | Admitting: Internal Medicine

## 2020-04-28 ENCOUNTER — Other Ambulatory Visit: Payer: Self-pay

## 2020-04-28 ENCOUNTER — Ambulatory Visit (INDEPENDENT_AMBULATORY_CARE_PROVIDER_SITE_OTHER): Payer: Managed Care, Other (non HMO) | Admitting: Internal Medicine

## 2020-04-28 VITALS — BP 120/70 | HR 75 | Temp 98.3°F | Ht 63.0 in | Wt 205.1 lb

## 2020-04-28 DIAGNOSIS — G4700 Insomnia, unspecified: Secondary | ICD-10-CM

## 2020-04-28 DIAGNOSIS — Z23 Encounter for immunization: Secondary | ICD-10-CM

## 2020-04-28 DIAGNOSIS — E7800 Pure hypercholesterolemia, unspecified: Secondary | ICD-10-CM

## 2020-04-28 DIAGNOSIS — N951 Menopausal and female climacteric states: Secondary | ICD-10-CM

## 2020-04-28 DIAGNOSIS — E119 Type 2 diabetes mellitus without complications: Secondary | ICD-10-CM | POA: Diagnosis not present

## 2020-04-28 DIAGNOSIS — Z0000 Encounter for general adult medical examination without abnormal findings: Secondary | ICD-10-CM

## 2020-04-28 DIAGNOSIS — I10 Essential (primary) hypertension: Secondary | ICD-10-CM

## 2020-04-28 DIAGNOSIS — F339 Major depressive disorder, recurrent, unspecified: Secondary | ICD-10-CM

## 2020-04-28 MED ORDER — OXYBUTYNIN CHLORIDE ER 15 MG PO TB24: ORAL_TABLET | ORAL | 1 refills | Status: DC

## 2020-04-28 MED ORDER — LORAZEPAM 0.5 MG PO TABS: 0.5000 mg | ORAL_TABLET | Freq: Three times a day (TID) | ORAL | 5 refills | Status: DC | PRN

## 2020-04-28 MED ORDER — SERTRALINE HCL 100 MG PO TABS: ORAL_TABLET | ORAL | 1 refills | Status: DC

## 2020-04-28 MED ORDER — EMPAGLIFLOZIN 25 MG PO TABS: ORAL_TABLET | ORAL | 1 refills | Status: DC

## 2020-04-28 MED ORDER — MELOXICAM 15 MG PO TABS: 15.0000 mg | ORAL_TABLET | Freq: Every day | ORAL | 1 refills | Status: DC

## 2020-04-28 MED ORDER — METFORMIN HCL ER 500 MG PO TB24: 500.0000 mg | ORAL_TABLET | Freq: Two times a day (BID) | ORAL | 1 refills | Status: DC

## 2020-04-28 MED ORDER — LISINOPRIL 10 MG PO TABS: 10.0000 mg | ORAL_TABLET | Freq: Every day | ORAL | 1 refills | Status: DC

## 2020-04-28 MED ORDER — VENLAFAXINE HCL 75 MG PO TABS: 75.0000 mg | ORAL_TABLET | Freq: Every day | ORAL | 1 refills | Status: DC

## 2020-04-28 NOTE — Progress Notes (Signed)
Established Patient Office Visit     This visit occurred during the SARS-CoV-2 public health emergency.  Safety protocols were in place, including screening questions prior to the visit, additional usage of staff PPE, and extensive cleaning of exam room while observing appropriate contact time as indicated for disinfecting solutions.    CC/Reason for Visit: Annual preventive exam, follow-up chronic medical conditions  HPI: Kayla [name] is a 59 y.o. female who is coming in today for the above mentioned reasons. Past Medical History is significant for: Hypertension, diabetes, hyperlipidemia, morbid obesity, sleep apnea.  I have not seen her in the office in a year.  She has routine eye and dental care.  She has had both Covid vaccines, she has had shingles vaccine, she is due for Tdap booster, she had a mammogram last week that was negative, she had a colonoscopy in 2015.  She has a GYN.  She has no acute issues.   Past Medical/Surgical History: Past Medical History:  Diagnosis Date  . Carpal tunnel syndrome of right wrist 10/2012  . Complication of anesthesia    hard to wake up after surg. 06/04/2012 and CO2 went up to 78  . Depressive disorder, not elsewhere classified   . Exertional dyspnea    occasionally  . 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 Headache(784.0)    due to allergies  . Heart murmur    states no problems  . Hyperlipidemia   . Mitral valve prolapse   . OSA on CPAP   . Osteoarthritis    knees  . Overactive bladder   . PTSD (post-traumatic stress disorder)   . Type II diabetes mellitus (HCC)    NIDDM    Past Surgical History:  Procedure Laterality Date  . BREAST BIOPSY Left 04/25/2001  . CARPAL TUNNEL RELEASE Right 10/24/2012   Procedure: CARPAL TUNNEL RELEASE;  Surgeon: Ninetta Lights, MD;  Location: [place] [facility];  Service: Orthopedics;  Laterality: Right;  RIGHT CARPAL  TUNNEL RELEASE  . KNEE ARTHROSCOPY Right 04/01/2008  . KNEE ARTHROSCOPY W/ MENISCECTOMY Left 08/16/2011   partial medial  . LASIK     bilateral  . PUBOVAGINAL SLING  08/27/2001   cysto; suprapubic tube placement  . SHOULDER ARTHROSCOPY W/ ROTATOR CUFF REPAIR Left 2004  . TOTAL ABDOMINAL HYSTERECTOMY  08/27/2001  . TOTAL KNEE ARTHROPLASTY  02/06/2012   Procedure: TOTAL KNEE ARTHROPLASTY;  Surgeon: Ninetta Lights, MD;  Location: [facility];  Service: Orthopedics;  Laterality: Right;  total knee right side  . TOTAL KNEE ARTHROPLASTY  06/04/2012   Procedure: TOTAL KNEE ARTHROPLASTY;  Surgeon: Ninetta Lights, MD;  Location: [facility];  Service: Orthopedics;  Laterality: Left;  . ULNAR COLLATERAL LIGAMENT REPAIR Left 11/08/2017   Procedure: REPAIR/RECONSTRUCTION LEFT THUMB ULNAR COLLATERAL LIGAMENT;  Surgeon: Daryll Brod, MD;  Location: [facility];  Service: Orthopedics;  Laterality: Left;  Marland Kitchen VAGINAL HYSTERECTOMY  1980's    Social History:  reports that she has never smoked. She has never used smokeless tobacco. She reports that she does not drink alcohol and does not use drugs.  Allergies: Allergies  Allergen Reactions  . Morphine And Related Shortness Of Breath and Itching    Family History:  Family History  Problem Relation Age of Onset  . Heart disease Father   . COPD Mother   . Heart disease Mother   . Heart attack Brother   .  Colon cancer Neg Hx      Current Outpatient Medications:  .  albuterol (PROVENTIL HFA) 108 (90 Base) MCG/ACT inhaler, Inhale 2 puffs into the lungs every 6 (six) hours as needed for wheezing or shortness of breath., Disp: 18 g, Rfl: 3 .  atorvastatin (LIPITOR) 20 MG tablet, Take 1 tablet by mouth once daily, Disp: 30 tablet, Rfl: 0 .  BREO ELLIPTA 100-25 MCG/INH AEPB, Inhale 1 puff by mouth once daily, Disp: 60 each, Rfl: 0 .  budesonide-formoterol (SYMBICORT) 160-4.5 MCG/ACT inhaler, Inhale 2 puffs then rinse mouth, twice daily, Disp: 1 Inhaler,  Rfl: 12 .  empagliflozin (JARDIANCE) 25 MG TABS tablet, TAKE 1 TABLET BY MOUTH ONCE DAILY, Disp: 90 tablet, Rfl: 1 .  fluticasone furoate-vilanterol (BREO ELLIPTA) 100-25 MCG/INH AEPB, Inhale 1 puff into the lungs daily., Disp: 2 each, Rfl: 0 .  lisinopril (ZESTRIL) 10 MG tablet, Take 1 tablet (10 mg total) by mouth daily., Disp: 90 tablet, Rfl: 1 .  Loperamide HCl (IMODIUM PO), Take 2 tablets by mouth 2 (two) times daily as needed. , Disp: , Rfl:  .  LORazepam (ATIVAN) 0.5 MG tablet, Take 1 tablet (0.5 mg total) by mouth every 8 (eight) hours as needed. for anxiety, Disp: 60 tablet, Rfl: 5 .  meloxicam (MOBIC) 15 MG tablet, Take 1 tablet (15 mg total) by mouth daily., Disp: 90 tablet, Rfl: 1 .  metFORMIN (GLUCOPHAGE-XR) 500 MG 24 hr tablet, Take 1 tablet (500 mg total) by mouth in the morning and at bedtime., Disp: 180 tablet, Rfl: 1 .  oxybutynin (DITROPAN XL) 15 MG 24 hr tablet, TAKE 1 TABLET BY MOUTH ONCE DAILY, Disp: 90 tablet, Rfl: 1 .  rOPINIRole (REQUIP) 0.25 MG tablet, TAKE 1 TO 2 TABLETS BY MOUTH BEFORE BEDTIME FOR  RESTLESS  LEG, Disp: 30 tablet, Rfl: 12 .  sertraline (ZOLOFT) 100 MG tablet, TAKE 1 TABLET BY MOUTH ONCE DAILY - PATIENT NEEDS AN APPT, Disp: 90 tablet, Rfl: 1 .  venlafaxine (EFFEXOR) 75 MG tablet, Take 1 tablet (75 mg total) by mouth daily., Disp: 90 tablet, Rfl: 1  Review of Systems:  Constitutional: Denies fever, chills, diaphoresis, appetite change and fatigue.  HEENT: Denies photophobia, eye pain, redness, hearing loss, ear pain, congestion, sore throat, rhinorrhea, sneezing, mouth sores, trouble swallowing, neck pain, neck stiffness and tinnitus.   Respiratory: Denies SOB, DOE, cough, chest tightness,  and wheezing.   Cardiovascular: Denies chest pain, palpitations and leg swelling.  Gastrointestinal: Denies nausea, vomiting, abdominal pain, diarrhea, constipation, blood in stool and abdominal distention.  Genitourinary: Denies dysuria, urgency, frequency, hematuria,  flank pain and difficulty urinating.  Endocrine: Denies: hot or cold intolerance, sweats, changes in hair or nails, polyuria, polydipsia. Musculoskeletal: Denies myalgias, back pain, joint swelling, arthralgias and gait problem.  Skin: Denies pallor, rash and wound.  Neurological: Denies dizziness, seizures, syncope, weakness, light-headedness, numbness and headaches.  Hematological: Denies adenopathy. Easy bruising, personal or family bleeding history  Psychiatric/Behavioral: Denies suicidal ideation, mood changes, confusion, nervousness, sleep disturbance and agitation    Physical Exam: Vitals:   04/28/20 1431  BP: 120/70  Pulse: 75  Temp: 98.3 F (36.8 C)  TempSrc: Oral  SpO2: 97%  Weight: 205 lb 1.6 oz (93 kg)  Height: 5' 3" (1.6 m)    Body mass index is 36.33 kg/m.   Constitutional: NAD, calm, comfortable Eyes: PERRL, lids and conjunctivae normal ENMT: Mucous membranes are moist. Respiratory: clear to auscultation bilaterally, no wheezing, no crackles. Normal respiratory effort. No accessory muscle  use.  Cardiovascular: Regular rate and rhythm, no murmurs / rubs / gallops. No extremity edema. 2+ pedal pulses. No carotid bruits.  Abdomen: no tenderness, no masses palpated. No hepatosplenomegaly. Bowel sounds positive.  Musculoskeletal: no clubbing / cyanosis. No joint deformity upper and lower extremities. Good ROM, no contractures. Normal muscle tone.  Skin: no rashes, lesions, ulcers. No induration Neurologic: CN 2-12 grossly intact. Sensation intact, DTR normal. Strength 5/5 in all 4.  Psychiatric: Normal judgment and insight. Alert and oriented x 3. Normal mood.    Impression and Plan:  Encounter for preventive health examination -She has routine eye and dental care. -Tdap will be updated today, otherwise immunizations are up-to-date and age-appropriate. -Screening labs today. -Healthy lifestyle discussed in detail. -Mammogram is updated. -Repeat colonoscopy in  2025. -She has a GYN.  HYPERTENSION, BENIGN ESSENTIAL  -Well-controlled on current medications.  Menopausal symptom  - Plan: venlafaxine (EFFEXOR) 75 MG tablet  Diabetes mellitus with coincident hypertension (HCC)  -Check A1c, further recommendations to follow.  Pure hypercholesterolemia  - Plan: Lipid panel -Last LDL was 77 in 2019.  She is on atorvastatin.  Morbid obesity ([place]) -Discussed healthy lifestyle, including increased physical activity and better food choices to promote weight loss.  Depression, recurrent ([employer])  -   Office Visit from 04/28/2020 in [place] at [facility]  PHQ-9 Total Score 0    -Mood is stable, refill Zoloft.   Insomnia, unspecified type  - Plan: LORazepam (ATIVAN) 0.5 MG tablet    Patient Instructions  -Nice seeing you today!!  -Lab work today; will notify you once results are available.  -Tetanus booster today.  -Flu vaccine after September.  -Schedule follow up in 3-4 months.   Preventive Care 46-64 Years Old, Female Preventive care refers to visits with your health care provider and lifestyle choices that can promote health and wellness. This includes:  A yearly physical exam. This may also be called an annual well check.  Regular dental visits and eye exams.  Immunizations.  Screening for certain conditions.  Healthy lifestyle choices, such as eating a healthy diet, getting regular exercise, not using drugs or products that contain nicotine and tobacco, and limiting alcohol use. What can I expect for my preventive care visit? Physical exam Your health care provider will check your:  Height and weight. This may be used to calculate body mass index (BMI), which tells if you are at a healthy weight.  Heart rate and blood pressure.  Skin for abnormal spots. Counseling Your health care provider may ask you questions about your:  Alcohol, tobacco, and drug use.  Emotional well-being.  Home and relationship  well-being.  Sexual activity.  Eating habits.  Work and work Statistician.  Method of birth control.  Menstrual cycle.  Pregnancy history. What immunizations do I need?  Influenza (flu) vaccine  This is recommended every year. Tetanus, diphtheria, and pertussis (Tdap) vaccine  You may need a Td booster every 10 years. Varicella (chickenpox) vaccine  You may need this if you have not been vaccinated. Zoster (shingles) vaccine  You may need this after age 30. Measles, mumps, and rubella (MMR) vaccine  You may need at least one dose of MMR if you were born in 1957 or later. You may also need a second dose. Pneumococcal conjugate (PCV13) vaccine  You may need this if you have certain conditions and were not previously vaccinated. Pneumococcal polysaccharide (PPSV23) vaccine  You may need one or two doses if you smoke cigarettes or if you  have certain conditions. Meningococcal conjugate (MenACWY) vaccine  You may need this if you have certain conditions. Hepatitis A vaccine  You may need this if you have certain conditions or if you travel or work in places where you may be exposed to hepatitis A. Hepatitis B vaccine  You may need this if you have certain conditions or if you travel or work in places where you may be exposed to hepatitis B. Haemophilus influenzae type b (Hib) vaccine  You may need this if you have certain conditions. Human papillomavirus (HPV) vaccine  If recommended by your health care provider, you may need three doses over 6 months. You may receive vaccines as individual doses or as more than one vaccine together in one shot (combination vaccines). Talk with your health care provider about the risks and benefits of combination vaccines. What tests do I need? Blood tests  Lipid and cholesterol levels. These may be checked every 5 years, or more frequently if you are over 68 years old.  Hepatitis C test.  Hepatitis B test. Screening  Lung  cancer screening. You may have this screening every year starting at age 67 if you have a 30-pack-year history of smoking and currently smoke or have quit within the past 15 years.  Colorectal cancer screening. All adults should have this screening starting at age 46 and continuing until age 43. Your health care provider may recommend screening at age 99 if you are at increased risk. You will have tests every 1-10 years, depending on your results and the type of screening test.  Diabetes screening. This is done by checking your blood sugar (glucose) after you have not eaten for a while (fasting). You may have this done every 1-3 years.  Mammogram. This may be done every 1-2 years. Talk with your health care provider about when you should start having regular mammograms. This may depend on whether you have a family history of breast cancer.  BRCA-related cancer screening. This may be done if you have a family history of breast, ovarian, tubal, or peritoneal cancers.  Pelvic exam and Pap test. This may be done every 3 years starting at age 41. Starting at age 70, this may be done every 5 years if you have a Pap test in combination with an HPV test. Other tests  Sexually transmitted disease (STD) testing.  Bone density scan. This is done to screen for osteoporosis. You may have this scan if you are at high risk for osteoporosis. Follow these instructions at home: Eating and drinking  Eat a diet that includes fresh fruits and vegetables, whole grains, lean protein, and low-fat dairy.  Take vitamin and mineral supplements as recommended by your health care provider.  Do not drink alcohol if: ? Your health care provider tells you not to drink. ? You are pregnant, may be pregnant, or are planning to become pregnant.  If you drink alcohol: ? Limit how much you have to 0-1 drink a day. ? Be aware of how much alcohol is in your drink. In the [place] one drink equals one 12 oz bottle of beer (355  mL), one 5 oz glass of wine (148 mL), or one 1 oz glass of hard liquor (44 mL). Lifestyle  Take daily care of your teeth and gums.  Stay active. Exercise for at least 30 minutes on 5 or more days each week.  Do not use any products that contain nicotine or tobacco, such as cigarettes, e-cigarettes, and chewing tobacco. If you  need help quitting, ask your health care provider.  If you are sexually active, practice safe sex. Use a condom or other form of birth control (contraception) in order to prevent pregnancy and STIs (sexually transmitted infections).  If told by your health care provider, take low-dose aspirin daily starting at age 64. What's next?  Visit your health care provider once a year for a well check visit.  Ask your health care provider how often you should have your eyes and teeth checked.  Stay up to date on all vaccines. This information is not intended to replace advice given to you by your health care provider. Make sure you discuss any questions you have with your health care provider. Document Revised: 05/08/2018 Document Reviewed: 05/08/2018 Elsevier Patient Education  2020 Naomi, MD [facility]

## 2020-04-28 NOTE — Patient Instructions (Signed)
-Nice seeing you today!!  -Lab work today; will notify you once results are available.  -Tetanus booster today.  -Flu vaccine after September.  -Schedule follow up in 3-4 months.   Preventive Care 29-61 Years Old, Female Preventive care refers to visits with your health care provider and lifestyle choices that can promote health and wellness. This includes:  A yearly physical exam. This may also be called an annual well check.  Regular dental visits and eye exams.  Immunizations.  Screening for certain conditions.  Healthy lifestyle choices, such as eating a healthy diet, getting regular exercise, not using drugs or products that contain nicotine and tobacco, and limiting alcohol use. What can I expect for my preventive care visit? Physical exam Your health care provider will check your:  Height and weight. This may be used to calculate body mass index (BMI), which tells if you are at a healthy weight.  Heart rate and blood pressure.  Skin for abnormal spots. Counseling Your health care provider may ask you questions about your:  Alcohol, tobacco, and drug use.  Emotional well-being.  Home and relationship well-being.  Sexual activity.  Eating habits.  Work and work Statistician.  Method of birth control.  Menstrual cycle.  Pregnancy history. What immunizations do I need?  Influenza (flu) vaccine  This is recommended every year. Tetanus, diphtheria, and pertussis (Tdap) vaccine  You may need a Td booster every 10 years. Varicella (chickenpox) vaccine  You may need this if you have not been vaccinated. Zoster (shingles) vaccine  You may need this after age 38. Measles, mumps, and rubella (MMR) vaccine  You may need at least one dose of MMR if you were born in 1957 or later. You may also need a second dose. Pneumococcal conjugate (PCV13) vaccine  You may need this if you have certain conditions and were not previously vaccinated. Pneumococcal  polysaccharide (PPSV23) vaccine  You may need one or two doses if you smoke cigarettes or if you have certain conditions. Meningococcal conjugate (MenACWY) vaccine  You may need this if you have certain conditions. Hepatitis A vaccine  You may need this if you have certain conditions or if you travel or work in places where you may be exposed to hepatitis A. Hepatitis B vaccine  You may need this if you have certain conditions or if you travel or work in places where you may be exposed to hepatitis B. Haemophilus influenzae type b (Hib) vaccine  You may need this if you have certain conditions. Human papillomavirus (HPV) vaccine  If recommended by your health care provider, you may need three doses over 6 months. You may receive vaccines as individual doses or as more than one vaccine together in one shot (combination vaccines). Talk with your health care provider about the risks and benefits of combination vaccines. What tests do I need? Blood tests  Lipid and cholesterol levels. These may be checked every 5 years, or more frequently if you are over 14 years old.  Hepatitis C test.  Hepatitis B test. Screening  Lung cancer screening. You may have this screening every year starting at age 21 if you have a 30-pack-year history of smoking and currently smoke or have quit within the past 15 years.  Colorectal cancer screening. All adults should have this screening starting at age 13 and continuing until age 59. Your health care provider may recommend screening at age 12 if you are at increased risk. You will have tests every 1-10 years, depending on  your results and the type of screening test.  Diabetes screening. This is done by checking your blood sugar (glucose) after you have not eaten for a while (fasting). You may have this done every 1-3 years.  Mammogram. This may be done every 1-2 years. Talk with your health care provider about when you should start having regular  mammograms. This may depend on whether you have a family history of breast cancer.  BRCA-related cancer screening. This may be done if you have a family history of breast, ovarian, tubal, or peritoneal cancers.  Pelvic exam and Pap test. This may be done every 3 years starting at age 77. Starting at age 77, this may be done every 5 years if you have a Pap test in combination with an HPV test. Other tests  Sexually transmitted disease (STD) testing.  Bone density scan. This is done to screen for osteoporosis. You may have this scan if you are at high risk for osteoporosis. Follow these instructions at home: Eating and drinking  Eat a diet that includes fresh fruits and vegetables, whole grains, lean protein, and low-fat dairy.  Take vitamin and mineral supplements as recommended by your health care provider.  Do not drink alcohol if: ? Your health care provider tells you not to drink. ? You are pregnant, may be pregnant, or are planning to become pregnant.  If you drink alcohol: ? Limit how much you have to 0-1 drink a day. ? Be aware of how much alcohol is in your drink. In the [place] one drink equals one 12 oz bottle of beer (355 mL), one 5 oz glass of wine (148 mL), or one 1 oz glass of hard liquor (44 mL). Lifestyle  Take daily care of your teeth and gums.  Stay active. Exercise for at least 30 minutes on 5 or more days each week.  Do not use any products that contain nicotine or tobacco, such as cigarettes, e-cigarettes, and chewing tobacco. If you need help quitting, ask your health care provider.  If you are sexually active, practice safe sex. Use a condom or other form of birth control (contraception) in order to prevent pregnancy and STIs (sexually transmitted infections).  If told by your health care provider, take low-dose aspirin daily starting at age 37. What's next?  Visit your health care provider once a year for a well check visit.  Ask your health care provider  how often you should have your eyes and teeth checked.  Stay up to date on all vaccines. This information is not intended to replace advice given to you by your health care provider. Make sure you discuss any questions you have with your health care provider. Document Revised: 05/08/2018 Document Reviewed: 05/08/2018 Elsevier Patient Education  2020 [employer].

## 2020-04-28 NOTE — Addendum Note (Signed)
Addended by: Westley Hummer B on: 04/28/2020 04:59 PM   Modules accepted: Orders

## 2020-04-29 ENCOUNTER — Encounter: Payer: Self-pay | Admitting: Internal Medicine

## 2020-04-29 ENCOUNTER — Other Ambulatory Visit: Payer: Self-pay | Admitting: Internal Medicine

## 2020-04-29 DIAGNOSIS — E559 Vitamin D deficiency, unspecified: Secondary | ICD-10-CM

## 2020-04-29 DIAGNOSIS — N951 Menopausal and female climacteric states: Secondary | ICD-10-CM

## 2020-04-29 DIAGNOSIS — E7800 Pure hypercholesterolemia, unspecified: Secondary | ICD-10-CM

## 2020-04-29 LAB — COMPREHENSIVE METABOLIC PANEL
AG Ratio: 1.8 (calc) (ref 1.0–2.5)
ALT: 24 U/L (ref 6–29)
AST: 20 U/L (ref 10–35)
Albumin: 4.3 g/dL (ref 3.6–5.1)
Alkaline phosphatase (APISO): 82 U/L (ref 37–153)
BUN: 14 mg/dL (ref 7–25)
CO2: 26 mmol/L (ref 20–32)
Calcium: 9.7 mg/dL (ref 8.6–10.4)
Chloride: 106 mmol/L (ref 98–110)
Creat: 0.99 mg/dL (ref 0.50–0.99)
Globulin: 2.4 g/dL (calc) (ref 1.9–3.7)
Glucose, Bld: 82 mg/dL (ref 65–99)
Potassium: 4.1 mmol/L (ref 3.5–5.3)
Sodium: 141 mmol/L (ref 135–146)
Total Bilirubin: 0.4 mg/dL (ref 0.2–1.2)
Total Protein: 6.7 g/dL (ref 6.1–8.1)

## 2020-04-29 LAB — LIPID PANEL
Cholesterol: 173 mg/dL (ref <200)
HDL: 60 mg/dL (ref >=50)
LDL Cholesterol (Calc): 94 mg/dL (calc)
Non-HDL Cholesterol (Calc): 113 mg/dL (calc) (ref <130)
Total CHOL/HDL Ratio: 2.9 (calc) (ref <5.0)
Triglycerides: 96 mg/dL (ref <150)

## 2020-04-29 LAB — CBC WITH DIFFERENTIAL/PLATELET
Absolute Monocytes: 445 cells/uL (ref 200–950)
Basophils Absolute: 0 cells/uL (ref 0–200)
Basophils Relative: 0 %
Eosinophils Absolute: 0 cells/uL — ABNORMAL LOW (ref 15–500)
Eosinophils Relative: 0 %
HCT: 43.2 % (ref 35.0–45.0)
Hemoglobin: 14.6 g/dL (ref 11.7–15.5)
Lymphs Abs: 2451 cells/uL (ref 850–3900)
MCH: 29 pg (ref 27.0–33.0)
MCHC: 33.8 g/dL (ref 32.0–36.0)
MCV: 85.7 fL (ref 80.0–100.0)
MPV: 10.5 fL (ref 7.5–12.5)
Monocytes Relative: 7.8 %
Neutro Abs: 2804 cells/uL (ref 1500–7800)
Neutrophils Relative %: 49.2 %
Platelets: 270 10*3/uL (ref 140–400)
RBC: 5.04 10*6/uL (ref 3.80–5.10)
RDW: 14.2 % (ref 11.0–15.0)
Total Lymphocyte: 43 %
WBC: 5.7 10*3/uL (ref 3.8–10.8)

## 2020-04-29 LAB — TSH: TSH: 2.45 mIU/L (ref 0.40–4.50)

## 2020-04-29 LAB — HEMOGLOBIN A1C
Hgb A1c MFr Bld: 6.6 % of total Hgb — ABNORMAL HIGH (ref <5.7)
Mean Plasma Glucose: 143 (calc)
eAG (mmol/L): 7.9 (calc)

## 2020-04-29 LAB — VITAMIN B12: Vitamin B-12: 348 pg/mL (ref 200–1100)

## 2020-04-29 LAB — VITAMIN D 25 HYDROXY (VIT D DEFICIENCY, FRACTURES): Vit D, 25-Hydroxy: 11 ng/mL — ABNORMAL LOW (ref 30–100)

## 2020-04-29 MED ORDER — ATORVASTATIN CALCIUM 20 MG PO TABS: 20.0000 mg | ORAL_TABLET | Freq: Every day | ORAL | 1 refills | Status: DC

## 2020-04-29 MED ORDER — VITAMIN D (ERGOCALCIFEROL) 1.25 MG (50000 UNIT) PO CAPS: 50000.0000 [IU] | ORAL_CAPSULE | ORAL | 0 refills | Status: AC

## 2020-05-04 ENCOUNTER — Other Ambulatory Visit: Payer: Self-pay | Admitting: Internal Medicine

## 2020-05-04 DIAGNOSIS — E559 Vitamin D deficiency, unspecified: Secondary | ICD-10-CM

## 2020-05-11 NOTE — Telephone Encounter (Signed)
Spoke with patient and labs reviewed.  See lab result note.

## 2020-05-23 ENCOUNTER — Other Ambulatory Visit: Payer: Self-pay | Admitting: Internal Medicine

## 2020-05-23 DIAGNOSIS — N951 Menopausal and female climacteric states: Secondary | ICD-10-CM

## 2020-05-24 ENCOUNTER — Encounter: Payer: Self-pay | Admitting: Internal Medicine

## 2020-06-02 ENCOUNTER — Telehealth: Payer: Self-pay | Admitting: Internal Medicine

## 2020-06-02 NOTE — Telephone Encounter (Signed)
Handicap Placard form to be filled out, placed in Dr's folder.  Please mail to: [address] [place], [place] [address] upon completion.

## 2020-06-06 LAB — HM DIABETES EYE EXAM

## 2020-06-07 NOTE — Telephone Encounter (Signed)
Left detailed message on machine for patient to call with more information.

## 2020-06-07 NOTE — Telephone Encounter (Signed)
Patient states the reason she needs a handicap placard is because:  -L4-L5- on is torn and is busted -Pelvic area has a crack (per Raliegh Ip) that gives her trouble so it makes her not be able to walk very far. -2 knee replacements-- R knee is fine, L one isn't great

## 2020-06-08 NOTE — Telephone Encounter (Signed)
Do you still have the form?

## 2020-06-08 NOTE — Telephone Encounter (Signed)
Form ready for pick up and patient is aware 

## 2020-06-10 LAB — HM DIABETES EYE EXAM

## 2020-06-15 ENCOUNTER — Encounter: Payer: Self-pay | Admitting: Internal Medicine

## 2020-07-21 ENCOUNTER — Other Ambulatory Visit: Payer: Self-pay | Admitting: Podiatry

## 2020-08-17 NOTE — Patient Instructions (Signed)
Kayla [name]  08/17/2020     @PREFPERIOPPHARMACY @   Your procedure is scheduled on  08/24/2020.  Report to [facility] at  0730  A.M.  Call this number if you have problems the morning of surgery:  [phone]   Remember:  Do not eat or drink after midnight.                       Take these medicines the morning of surgery with A SIP OF WATER  Ativan()if needed), mobic(if needed), ditropan, zoloft, effexor. Use your inhalers before you come. DO NOT take any medications for diabetes the morning of your procedure.    Do not wear jewelry, make-up or nail polish.  Do not wear lotions, powders, or perfumes. Please wear deodorant and brush your teeth.  Do not shave 48 hours prior to surgery.  Men may shave face and neck.  Do not bring valuables to the hospital.  [facility] is not responsible for any belongings or valuables.  Contacts, dentures or bridgework may not be worn into surgery.  Leave your suitcase in the car.  After surgery it may be brought to your room.  For patients admitted to the hospital, discharge time will be determined by your treatment team.  Patients discharged the day of surgery will not be allowed to drive home.   Name and phone number of your driver:   family Special instructions:   DO NOT smoke the morning of your procedure  Please read over the following fact sheets that you were given. Anesthesia Post-op Instructions and Care and Recovery After Surgery       Bunion Surgery, Care After This sheet gives you information about how to care for yourself after your procedure. Your health care provider may also give you more specific instructions. If you have problems or questions, contact your health care provider. What can I expect after the procedure? After the procedure, it is common to have:  Pain.  Swelling.  A small amount of fluid coming from your incision. Follow these instructions at home: If you have a post-operative  brace, boot, or shoe:  Wear the post-op (post-operative) brace, boot, or shoe as told by your health care provider. Remove it only as told by your health care provider.  Loosen the brace, boot, or shoe if your toes tingle, become numb, or turn cold and blue.  Keep the brace, boot, or shoe clean and dry. If you have a cast:  Do not stick anything inside the cast to scratch your skin. Doing that increases your risk of infection.  Check the skin around the cast every day. Tell your health care provider about any concerns.  You may put lotion on dry skin around the edges of the cast. Do not put lotion on the skin underneath the cast.  Keep the cast clean and dry. Bathing  Do not take baths, swim, or use a hot tub until your health care provider approves. Ask your health care provider if you may take showers.  If your brace, boot, shoe, or cast is not waterproof: ? Do not let it get wet. ? Cover it with a watertight covering when you take a bath or a shower.  Keep your bandage (dressing) dry until your health care provider says it can be removed. Incision care   The dressing holds your toe in the correct position. Do not change the dressing until your health  care provider approves.  Follow instructions from your health care provider about how to take care of your incision. Make sure you: ? Wash your hands with soap and water before you change your dressing. If soap and water are not available, use hand sanitizer. ? Change your dressing as told by your health care provider. ? Leave stitches (sutures), skin glue, or adhesive strips in place. These skin closures may need to stay in place for 2 weeks or longer. If adhesive strip edges start to loosen and curl up, you may trim the loose edges. Do not remove adhesive strips completely unless your health care provider tells you to do that.  Check your incision area every day for signs of infection. Check for: ? More redness, swelling, or  pain. ? Blood or more fluid. ? Warmth. ? Pus or a bad smell. Managing pain, stiffness, and swelling   If directed, put ice on the affected area. ? If you have a removable brace, boot, or shoe, remove it as told by your health care provider. ? Put ice in a plastic bag. ? Place a towel between your skin and the bag or between your cast and the bag. ? Leave the ice on for 20 minutes, 2-3 times a day.  Move your toes often to avoid stiffness and to lessen swelling.  Raise (elevate) your foot above the level of your heart while you are sitting or lying down. Driving  Do not drive for 24 hours if you were given a medicine to help you relax (sedative) during your procedure.  Do not drive or use heavy machinery while taking prescription pain medicine.  Ask your health care provider when it is safe to drive if you have a brace, boot, shoe, or cast on your foot. Activity  Return to your normal activities as told by your health care provider. Ask your health care provider what activities are safe for you.  If physical therapy was prescribed, do exercises as told by your health care provider. Safety  Do not use the affected leg to support (bear) your body weight until your health care provider says that you can. Follow weight-bearing restrictions as told. Use crutches, a cane, or a walker as told by your health care provider. General instructions  Do not use any products that contain nicotine or tobacco, such as cigarettes and e-cigarettes. These can delay bone healing. If you need help quitting, ask your health care provider.  Take over-the-counter and prescription medicines only as told by your health care provider.  If you are taking prescription pain medicine, take actions to prevent or treat constipation. Your health care provider may recommend that you: ? Drink enough fluid to keep your urine pale yellow. ? Eat foods that are high in fiber, such as fresh fruits and vegetables, whole  grains, and beans. ? Limit foods that are high in fat and processed sugars, such as fried or sweet foods. ? Take an over-the-counter or prescription medicine for constipation.  Do not wear high heels or tight-fitting shoes, even after you heal.  Keep all follow-up visits as told by your health care provider. This is important. Contact a health care provider if:  You have more redness, swelling, or pain around your incision.  You have more fluid or blood coming from the incision.  Your incision feels warm to the touch.  There is pus or a bad smell coming from your incision.  You have a fever or chills.  Your dressing gets wet  or it falls off.  You have swelling in your lower leg.  You have numbness or stiffness in your toes. Get help right away if:  You have a rash.  You have difficulty breathing. Summary  Do not use the affected leg to support (bear) your body weight until your health care provider says that you can. Follow weight-bearing restrictions as told. Use crutches, a cane, or a walker as told by your health care provider.  If directed, put ice on the affected area. Leave the ice on for 20 minutes, 2-3 times a day.  Do not drive or use heavy machinery while taking prescription pain medicine. This information is not intended to replace advice given to you by your health care provider. Make sure you discuss any questions you have with your health care provider. Document Revised: 08/09/2017 Document Reviewed: 06/03/2017 Elsevier Patient Education  2020 [place] After These instructions provide you with information about caring for yourself after your procedure. Your health care provider may also give you more specific instructions. Your treatment has been planned according to current medical practices, but problems sometimes occur. Call your health care provider if you have any problems or questions after your procedure. What can I  expect after the procedure? After your procedure, you may:  Feel sleepy for several hours.  Feel clumsy and have poor balance for several hours.  Feel forgetful about what happened after the procedure.  Have poor judgment for several hours.  Feel nauseous or vomit.  Have a sore throat if you had a breathing tube during the procedure. Follow these instructions at home: For at least 24 hours after the procedure:      Have a responsible adult stay with you. It is important to have someone help care for you until you are awake and alert.  Rest as needed.  Do not: ? Participate in activities in which you could fall or become injured. ? Drive. ? Use heavy machinery. ? Drink alcohol. ? Take sleeping pills or medicines that cause drowsiness. ? Make important decisions or sign legal documents. ? Take care of children on your own. Eating and drinking  Follow the diet that is recommended by your health care provider.  If you vomit, drink water, juice, or soup when you can drink without vomiting.  Make sure you have little or no nausea before eating solid foods. General instructions  Take over-the-counter and prescription medicines only as told by your health care provider.  If you have sleep apnea, surgery and certain medicines can increase your risk for breathing problems. Follow instructions from your health care provider about wearing your sleep device: ? Anytime you are sleeping, including during daytime naps. ? While taking prescription pain medicines, sleeping medicines, or medicines that make you drowsy.  If you smoke, do not smoke without supervision.  Keep all follow-up visits as told by your health care provider. This is important. Contact a health care provider if:  You keep feeling nauseous or you keep vomiting.  You feel light-headed.  You develop a rash.  You have a fever. Get help right away if:  You have trouble breathing. Summary  For several  hours after your procedure, you may feel sleepy and have poor judgment.  Have a responsible adult stay with you for at least 24 hours or until you are awake and alert. This information is not intended to replace advice given to you by your health care provider. Make sure you  discuss any questions you have with your health care provider. Document Revised: 11/25/2017 Document Reviewed: 12/18/2015 Elsevier Patient Education  [place]. How to Use Chlorhexidine for Bathing Chlorhexidine gluconate (CHG) is a germ-killing (antiseptic) solution that is used to clean the skin. It can get rid of the bacteria that normally live on the skin and can keep them away for about 24 hours. To clean your skin with CHG, you may be given:  A CHG solution to use in the shower or as part of a sponge bath.  A prepackaged cloth that contains CHG. Cleaning your skin with CHG may help lower the risk for infection:  While you are staying in the intensive care unit of the hospital.  If you have a vascular access, such as a central line, to provide short-term or long-term access to your veins.  If you have a catheter to drain urine from your bladder.  If you are on a ventilator. A ventilator is a machine that helps you breathe by moving air in and out of your lungs.  After surgery. What are the risks? Risks of using CHG include:  A skin reaction.  Hearing loss, if CHG gets in your ears.  Eye injury, if CHG gets in your eyes and is not rinsed out.  The CHG product catching fire. Make sure that you avoid smoking and flames after applying CHG to your skin. Do not use CHG:  If you have a chlorhexidine allergy or have previously reacted to chlorhexidine.  On babies younger than 31 months of age. How to use CHG solution  Use CHG only as told by your health care provider, and follow the instructions on the label.  Use the full amount of CHG as directed. Usually, this is one bottle. During a  shower Follow these steps when using CHG solution during a shower (unless your health care provider gives you different instructions): 1. Start the shower. 2. Use your normal soap and shampoo to wash your face and hair. 3. Turn off the shower or move out of the shower stream. 4. Pour the CHG onto a clean washcloth. Do not use any type of brush or rough-edged sponge. 5. Starting at your neck, lather your body down to your toes. Make sure you follow these instructions: ? If you will be having surgery, pay special attention to the part of your body where you will be having surgery. Scrub this area for at least 1 minute. ? Do not use CHG on your head or face. If the solution gets into your ears or eyes, rinse them well with water. ? Avoid your genital area. ? Avoid any areas of skin that have broken skin, cuts, or scrapes. ? Scrub your back and under your arms. Make sure to wash skin folds. 6. Let the lather sit on your skin for 1-2 minutes or as long as told by your health care provider. 7. Thoroughly rinse your entire body in the shower. Make sure that all body creases and crevices are rinsed well. 8. Dry off with a clean towel. Do not put any substances on your body afterward--such as powder, lotion, or perfume--unless you are told to do so by your health care provider. Only use lotions that are recommended by the manufacturer. 9. Put on clean clothes or pajamas. 10. If it is the night before your surgery, sleep in clean sheets.  During a sponge bath Follow these steps when using CHG solution during a sponge bath (unless your health care  provider gives you different instructions): 1. Use your normal soap and shampoo to wash your face and hair. 2. Pour the CHG onto a clean washcloth. 3. Starting at your neck, lather your body down to your toes. Make sure you follow these instructions: ? If you will be having surgery, pay special attention to the part of your body where you will be having  surgery. Scrub this area for at least 1 minute. ? Do not use CHG on your head or face. If the solution gets into your ears or eyes, rinse them well with water. ? Avoid your genital area. ? Avoid any areas of skin that have broken skin, cuts, or scrapes. ? Scrub your back and under your arms. Make sure to wash skin folds. 4. Let the lather sit on your skin for 1-2 minutes or as long as told by your health care provider. 5. Using a different clean, wet washcloth, thoroughly rinse your entire body. Make sure that all body creases and crevices are rinsed well. 6. Dry off with a clean towel. Do not put any substances on your body afterward--such as powder, lotion, or perfume--unless you are told to do so by your health care provider. Only use lotions that are recommended by the manufacturer. 7. Put on clean clothes or pajamas. 8. If it is the night before your surgery, sleep in clean sheets. How to use CHG prepackaged cloths  Only use CHG cloths as told by your health care provider, and follow the instructions on the label.  Use the CHG cloth on clean, dry skin.  Do not use the CHG cloth on your head or face unless your health care provider tells you to.  When washing with the CHG cloth: ? Avoid your genital area. ? Avoid any areas of skin that have broken skin, cuts, or scrapes. Before surgery Follow these steps when using a CHG cloth to clean before surgery (unless your health care provider gives you different instructions): 1. Using the CHG cloth, vigorously scrub the part of your body where you will be having surgery. Scrub using a back-and-forth motion for 3 minutes. The area on your body should be completely wet with CHG when you are done scrubbing. 2. Do not rinse. Discard the cloth and let the area air-dry. Do not put any substances on the area afterward, such as powder, lotion, or perfume. 3. Put on clean clothes or pajamas. 4. If it is the night before your surgery, sleep in clean  sheets.  For general bathing Follow these steps when using CHG cloths for general bathing (unless your health care provider gives you different instructions). 1. Use a separate CHG cloth for each area of your body. Make sure you wash between any folds of skin and between your fingers and toes. Wash your body in the following order, switching to a new cloth after each step: ? The front of your neck, shoulders, and chest. ? Both of your arms, under your arms, and your hands. ? Your stomach and groin area, avoiding the genitals. ? Your right leg and foot. ? Your left leg and foot. ? The back of your neck, your back, and your buttocks. 2. Do not rinse. Discard the cloth and let the area air-dry. Do not put any substances on your body afterward--such as powder, lotion, or perfume--unless you are told to do so by your health care provider. Only use lotions that are recommended by the manufacturer. 3. Put on clean clothes or pajamas. Contact a  health care provider if:  Your skin gets irritated after scrubbing.  You have questions about using your solution or cloth. Get help right away if:  Your eyes become very red or swollen.  Your eyes itch badly.  Your skin itches badly and is red or swollen.  Your hearing changes.  You have trouble seeing.  You have swelling or tingling in your mouth or throat.  You have trouble breathing.  You swallow any chlorhexidine. Summary  Chlorhexidine gluconate (CHG) is a germ-killing (antiseptic) solution that is used to clean the skin. Cleaning your skin with CHG may help to lower your risk for infection.  You may be given CHG to use for bathing. It may be in a bottle or in a prepackaged cloth to use on your skin. Carefully follow your health care provider's instructions and the instructions on the product label.  Do not use CHG if you have a chlorhexidine allergy.  Contact your health care provider if your skin gets irritated after scrubbing. This  information is not intended to replace advice given to you by your health care provider. Make sure you discuss any questions you have with your health care provider. Document Revised: 11/13/2018 Document Reviewed: 07/25/2017 Elsevier Patient Education  [place].

## 2020-08-19 DIAGNOSIS — M205X1 Other deformities of toe(s) (acquired), right foot: Secondary | ICD-10-CM | POA: Diagnosis not present

## 2020-08-19 DIAGNOSIS — M79671 Pain in right foot: Secondary | ICD-10-CM | POA: Diagnosis not present

## 2020-08-22 ENCOUNTER — Ambulatory Visit (HOSPITAL_COMMUNITY)
Admission: RE | Admit: 2020-08-22 | Discharge: 2020-08-22 | Disposition: A | Discharge status: Home / Self Care | Payer: Managed Care, Other (non HMO) | Source: Ambulatory Visit | Attending: Podiatry | Admitting: Podiatry

## 2020-08-22 ENCOUNTER — Encounter (HOSPITAL_COMMUNITY): Payer: Self-pay

## 2020-08-22 ENCOUNTER — Other Ambulatory Visit: Payer: Self-pay

## 2020-08-22 ENCOUNTER — Other Ambulatory Visit (HOSPITAL_COMMUNITY)
Admission: RE | Admit: 2020-08-22 | Discharge: 2020-08-22 | Disposition: A | Discharge status: Home / Self Care | Payer: Managed Care, Other (non HMO) | Source: Ambulatory Visit | Attending: Podiatry | Admitting: Podiatry

## 2020-08-22 ENCOUNTER — Encounter (HOSPITAL_COMMUNITY)
Admission: RE | Admit: 2020-08-22 | Discharge: 2020-08-22 | Disposition: A | Discharge status: Home / Self Care | Payer: Managed Care, Other (non HMO) | Source: Ambulatory Visit | Attending: Podiatry | Admitting: Podiatry

## 2020-08-22 DIAGNOSIS — Z01818 Encounter for other preprocedural examination: Secondary | ICD-10-CM | POA: Insufficient documentation

## 2020-08-22 DIAGNOSIS — M19071 Primary osteoarthritis, right ankle and foot: Secondary | ICD-10-CM | POA: Insufficient documentation

## 2020-08-22 DIAGNOSIS — M79674 Pain in right toe(s): Secondary | ICD-10-CM | POA: Diagnosis not present

## 2020-08-22 DIAGNOSIS — Z20822 Contact with and (suspected) exposure to covid-19: Secondary | ICD-10-CM | POA: Insufficient documentation

## 2020-08-22 DIAGNOSIS — M205X1 Other deformities of toe(s) (acquired), right foot: Secondary | ICD-10-CM

## 2020-08-22 HISTORY — DX: Unspecified asthma, uncomplicated: J45.909

## 2020-08-23 LAB — SARS CORONAVIRUS 2 (TAT 6-24 HRS): SARS Coronavirus 2: NEGATIVE

## 2020-08-24 ENCOUNTER — Ambulatory Visit (HOSPITAL_COMMUNITY): Payer: Managed Care, Other (non HMO) | Admitting: Anesthesiology

## 2020-08-24 ENCOUNTER — Encounter (HOSPITAL_COMMUNITY): Payer: Self-pay | Admitting: Podiatry

## 2020-08-24 ENCOUNTER — Ambulatory Visit (HOSPITAL_COMMUNITY): Payer: Managed Care, Other (non HMO)

## 2020-08-24 ENCOUNTER — Encounter (HOSPITAL_COMMUNITY)
Admission: RE | Disposition: A | Discharge status: Home / Self Care | Payer: Self-pay | Source: Home / Self Care | Attending: Podiatry

## 2020-08-24 ENCOUNTER — Ambulatory Visit (HOSPITAL_COMMUNITY)
Admission: RE | Admit: 2020-08-24 | Discharge: 2020-08-24 | Disposition: A | Discharge status: Home / Self Care | Payer: Managed Care, Other (non HMO) | Attending: Podiatry | Admitting: Podiatry

## 2020-08-24 DIAGNOSIS — M79671 Pain in right foot: Secondary | ICD-10-CM | POA: Diagnosis not present

## 2020-08-24 DIAGNOSIS — M205X1 Other deformities of toe(s) (acquired), right foot: Secondary | ICD-10-CM | POA: Insufficient documentation

## 2020-08-24 DIAGNOSIS — Z8249 Family history of ischemic heart disease and other diseases of the circulatory system: Secondary | ICD-10-CM | POA: Diagnosis not present

## 2020-08-24 DIAGNOSIS — I10 Essential (primary) hypertension: Secondary | ICD-10-CM | POA: Diagnosis not present

## 2020-08-24 DIAGNOSIS — G8929 Other chronic pain: Secondary | ICD-10-CM | POA: Diagnosis not present

## 2020-08-24 DIAGNOSIS — Z79899 Other long term (current) drug therapy: Secondary | ICD-10-CM | POA: Insufficient documentation

## 2020-08-24 DIAGNOSIS — E119 Type 2 diabetes mellitus without complications: Secondary | ICD-10-CM | POA: Diagnosis not present

## 2020-08-24 DIAGNOSIS — Z9889 Other specified postprocedural states: Secondary | ICD-10-CM

## 2020-08-24 DIAGNOSIS — J45909 Unspecified asthma, uncomplicated: Secondary | ICD-10-CM | POA: Insufficient documentation

## 2020-08-24 DIAGNOSIS — Q7021 Fused toes, right foot: Secondary | ICD-10-CM | POA: Diagnosis not present

## 2020-08-24 DIAGNOSIS — Z885 Allergy status to narcotic agent status: Secondary | ICD-10-CM | POA: Diagnosis not present

## 2020-08-24 HISTORY — PX: ARTHRODESIS METATARSALPHALANGEAL JOINT (MTPJ): SHX6566

## 2020-08-24 LAB — GLUCOSE, CAPILLARY
Glucose-Capillary: 169 mg/dL — ABNORMAL HIGH (ref 70–99)
Glucose-Capillary: 122 mg/dL — ABNORMAL HIGH (ref 70–99)

## 2020-08-24 SURGERY — FUSION, JOINT, GREAT TOE
Anesthesia: General | Site: Toe | Laterality: Right

## 2020-08-24 MED ORDER — LIDOCAINE HCL 1 % IJ SOLN
INTRAMUSCULAR | Status: DC | PRN
  Administered 2020-08-24: 10 mL via INTRADERMAL
  Administered 2020-08-24: 7 mL via INTRADERMAL

## 2020-08-24 MED ORDER — LIDOCAINE HCL (PF) 1 % IJ SOLN
INTRAMUSCULAR | Status: AC
  Filled 2020-08-24: qty 30

## 2020-08-24 MED ORDER — CEFAZOLIN SODIUM-DEXTROSE 2-4 GM/100ML-% IV SOLN
2.0000 g | INTRAVENOUS | Status: AC
  Administered 2020-08-24: 2 g via INTRAVENOUS

## 2020-08-24 MED ORDER — ONDANSETRON HCL 4 MG/2ML IJ SOLN
INTRAMUSCULAR | Status: AC
  Filled 2020-08-24: qty 2

## 2020-08-24 MED ORDER — ONDANSETRON HCL 4 MG/2ML IJ SOLN
INTRAMUSCULAR | Status: DC | PRN
  Administered 2020-08-24: 4 mg via INTRAVENOUS

## 2020-08-24 MED ORDER — ORAL CARE MOUTH RINSE: 15.0000 mL | Freq: Once | OROMUCOSAL | Status: AC

## 2020-08-24 MED ORDER — CEFAZOLIN SODIUM-DEXTROSE 2-4 GM/100ML-% IV SOLN
INTRAVENOUS | Status: AC
  Filled 2020-08-24: qty 100

## 2020-08-24 MED ORDER — KETAMINE HCL 50 MG/5ML IJ SOSY
PREFILLED_SYRINGE | INTRAMUSCULAR | Status: AC
  Filled 2020-08-24: qty 5

## 2020-08-24 MED ORDER — CHLORHEXIDINE GLUCONATE CLOTH 2 % EX PADS: 6.0000 | MEDICATED_PAD | Freq: Once | CUTANEOUS | Status: DC

## 2020-08-24 MED ORDER — BUPIVACAINE HCL (PF) 0.5 % IJ SOLN
INTRAMUSCULAR | Status: AC
  Filled 2020-08-24: qty 30

## 2020-08-24 MED ORDER — PROPOFOL 10 MG/ML IV BOLUS
INTRAVENOUS | Status: AC
  Filled 2020-08-24: qty 40

## 2020-08-24 MED ORDER — PROPOFOL 10 MG/ML IV BOLUS
INTRAVENOUS | Status: DC | PRN
  Administered 2020-08-24 (×5): 20 mg via INTRAVENOUS

## 2020-08-24 MED ORDER — PROPOFOL 500 MG/50ML IV EMUL
INTRAVENOUS | Status: DC | PRN
  Administered 2020-08-24: 75 ug/kg/min via INTRAVENOUS

## 2020-08-24 MED ORDER — CHLORHEXIDINE GLUCONATE 0.12 % MT SOLN
OROMUCOSAL | Status: AC
  Filled 2020-08-24: qty 15

## 2020-08-24 MED ORDER — ONDANSETRON HCL 4 MG/2ML IJ SOLN: 4.0000 mg | Freq: Once | INTRAMUSCULAR | Status: DC | PRN

## 2020-08-24 MED ORDER — FENTANYL CITRATE (PF) 100 MCG/2ML IJ SOLN
INTRAMUSCULAR | Status: AC
  Filled 2020-08-24: qty 2

## 2020-08-24 MED ORDER — LACTATED RINGERS IV SOLN
INTRAVENOUS | Status: DC
  Administered 2020-08-24: 1000 mL via INTRAVENOUS

## 2020-08-24 MED ORDER — MIDAZOLAM HCL 2 MG/2ML IJ SOLN
INTRAMUSCULAR | Status: AC
  Filled 2020-08-24: qty 2

## 2020-08-24 MED ORDER — 0.9 % SODIUM CHLORIDE (POUR BTL) OPTIME
TOPICAL | Status: DC | PRN
  Administered 2020-08-24: 1000 mL

## 2020-08-24 MED ORDER — MIDAZOLAM HCL 5 MG/5ML IJ SOLN
INTRAMUSCULAR | Status: DC | PRN
  Administered 2020-08-24: 2 mg via INTRAVENOUS

## 2020-08-24 MED ORDER — BUPIVACAINE HCL (PF) 0.5 % IJ SOLN
INTRAMUSCULAR | Status: DC | PRN
  Administered 2020-08-24: 20 mL

## 2020-08-24 MED ORDER — KETAMINE HCL 10 MG/ML IJ SOLN
INTRAMUSCULAR | Status: DC | PRN
  Administered 2020-08-24: 10 mg via INTRAVENOUS

## 2020-08-24 MED ORDER — CHLORHEXIDINE GLUCONATE 0.12 % MT SOLN
15.0000 mL | Freq: Once | OROMUCOSAL | Status: AC
  Administered 2020-08-24: 15 mL via OROMUCOSAL

## 2020-08-24 MED ORDER — FENTANYL CITRATE (PF) 100 MCG/2ML IJ SOLN
INTRAMUSCULAR | Status: DC | PRN
  Administered 2020-08-24: 50 ug via INTRAVENOUS
  Administered 2020-08-24 (×2): 25 ug via INTRAVENOUS

## 2020-08-24 MED ORDER — FENTANYL CITRATE (PF) 100 MCG/2ML IJ SOLN: 25.0000 ug | INTRAMUSCULAR | Status: DC | PRN

## 2020-08-24 SURGICAL SUPPLY — 67 items
APL PRP STRL LF DISP 70% ISPRP (MISCELLANEOUS) ×1
APL SKNCLS STERI-STRIP NONHPOA (GAUZE/BANDAGES/DRESSINGS)
BANDAGE ESMARK 4X12 BL STRL LF (DISPOSABLE) ×1 IMPLANT
BENZOIN TINCTURE PRP APPL 2/3 (GAUZE/BANDAGES/DRESSINGS) IMPLANT
BLADE AVERAGE 25X9 (BLADE) ×4 IMPLANT
BLADE SURG 15 STRL LF DISP TIS (BLADE) ×2 IMPLANT
BLADE SURG 15 STRL SS (BLADE) ×4
BNDG CMPR 12X4 ELC STRL LF (DISPOSABLE) ×1
BNDG CMPR STD VLCR NS LF 5.8X4 (GAUZE/BANDAGES/DRESSINGS) ×2
BNDG CMPR STD VLCR NS LF 5.8X6 (GAUZE/BANDAGES/DRESSINGS) ×1
BNDG CONFORM 2 STRL LF (GAUZE/BANDAGES/DRESSINGS) ×2 IMPLANT
BNDG ELASTIC 4X5.8 VLCR NS LF (GAUZE/BANDAGES/DRESSINGS) ×4 IMPLANT
BNDG ELASTIC 6X5.8 VLCR NS LF (GAUZE/BANDAGES/DRESSINGS) ×2 IMPLANT
BNDG ESMARK 4X12 BLUE STRL LF (DISPOSABLE) ×2
BNDG GAUZE ELAST 4 BULKY (GAUZE/BANDAGES/DRESSINGS) ×2 IMPLANT
CHLORAPREP W/TINT 26 (MISCELLANEOUS) ×2 IMPLANT
CLOTH BEACON ORANGE TIMEOUT ST (SAFETY) ×2 IMPLANT
COVER LIGHT HANDLE STERIS (MISCELLANEOUS) ×4 IMPLANT
COVER WAND RF STERILE (DRAPES) ×2 IMPLANT
CUFF TOURN SGL QUICK 34 (TOURNIQUET CUFF) ×2
CUFF TRNQT CYL 34X4.125X (TOURNIQUET CUFF) ×1 IMPLANT
DECANTER SPIKE VIAL GLASS SM (MISCELLANEOUS) ×4 IMPLANT
DRAPE C-ARM FOLDED MOBILE STRL (DRAPES) ×2 IMPLANT
DRAPE OEC MINIVIEW 54X84 (DRAPES) ×2 IMPLANT
DRSG ADAPTIC 3X8 NADH LF (GAUZE/BANDAGES/DRESSINGS) ×2 IMPLANT
ELECT REM PT RETURN 9FT ADLT (ELECTROSURGICAL) ×2
ELECTRODE REM PT RTRN 9FT ADLT (ELECTROSURGICAL) ×1 IMPLANT
GAUZE 4X4 16PLY RFD (DISPOSABLE) ×2 IMPLANT
GAUZE KERLIX 2X3 DERM STRL LF (GAUZE/BANDAGES/DRESSINGS) ×2 IMPLANT
GAUZE SPONGE 4X4 12PLY STRL (GAUZE/BANDAGES/DRESSINGS) ×2 IMPLANT
GLOVE BIO SURGEON STRL SZ7.5 (GLOVE) ×2 IMPLANT
GLOVE BIOGEL PI IND STRL 7.0 (GLOVE) ×2 IMPLANT
GLOVE BIOGEL PI IND STRL 7.5 (GLOVE) ×1 IMPLANT
GLOVE BIOGEL PI INDICATOR 7.0 (GLOVE) ×2
GLOVE BIOGEL PI INDICATOR 7.5 (GLOVE) ×1
GLOVE ECLIPSE 7.0 STRL STRAW (GLOVE) ×2 IMPLANT
GOWN STRL REUS W/ TWL LRG LVL3 (GOWN DISPOSABLE) ×1 IMPLANT
GOWN STRL REUS W/TWL LRG LVL3 (GOWN DISPOSABLE) ×6 IMPLANT
GUIDEWIRE ORTH 6X062XTROC NS (WIRE) ×1 IMPLANT
HANDLE RATCHET STRL (INSTRUMENTS) ×2 IMPLANT
K-WIRE .062 (WIRE) ×2
KIT INSTRUMENT DYNAFORCE PLATE (KITS) ×2 IMPLANT
MANIFOLD NEPTUNE II (INSTRUMENTS) ×2 IMPLANT
NEEDLE HYPO 27GX1-1/4 (NEEDLE) ×6 IMPLANT
NS IRRIG 1000ML POUR BTL (IV SOLUTION) ×2 IMPLANT
PACK BASIC LIMB (CUSTOM PROCEDURE TRAY) ×2 IMPLANT
PAD ARMBOARD 7.5X6 YLW CONV (MISCELLANEOUS) ×2 IMPLANT
PLATE MTP DYNAFORCE CLIP 18 5D (Plate) ×2 IMPLANT
RASP SM TEAR CROSS CUT (RASP) ×2 IMPLANT
REAMER CUP/CONE DYNAFORCE 18 (MISCELLANEOUS) ×2 IMPLANT
SCREW LOCK POLY 3.0X10 (Screw) ×2 IMPLANT
SCREW LOCK POLYAXIAL 3.0X12 (Screw) ×2 IMPLANT
SCREW LOCK POLYAXIAL 3.0X16 (Screw) ×4 IMPLANT
SET BASIN LINEN APH (SET/KITS/TRAYS/PACK) ×2 IMPLANT
SPLINT FIBERGLASS 4X30 (CAST SUPPLIES) ×2 IMPLANT
SPONGE GAUZE 4X4 12PLY (GAUZE/BANDAGES/DRESSINGS) ×2 IMPLANT
SPONGE LAP 18X18 RF (DISPOSABLE) ×2 IMPLANT
STAPLE KTI HIMAX 18X18X14 (Staple) ×2 IMPLANT
STAPLER VISISTAT 35W (STAPLE) IMPLANT
STRIP CLOSURE SKIN 1/2X4 (GAUZE/BANDAGES/DRESSINGS) ×2 IMPLANT
SUT ETHILON 3 0 FSL (SUTURE) ×2 IMPLANT
SUT PROLENE 4 0 PS 2 18 (SUTURE) ×2 IMPLANT
SUT VIC AB 2-0 CT2 27 (SUTURE) IMPLANT
SUT VIC AB 4-0 PS2 27 (SUTURE) ×2 IMPLANT
SUT VICRYL AB 3-0 FS1 BRD 27IN (SUTURE) ×2 IMPLANT
SYR BULB IRRIG 60ML STRL (SYRINGE) ×2 IMPLANT
SYR CONTROL 10ML LL (SYRINGE) ×4 IMPLANT

## 2020-08-24 NOTE — Discharge Instructions (Addendum)
These instructions will give you an idea of what to expect after surgery and how to manage issues that may arise before your first post op office visit.  Pain Management Pain is best managed by "staying ahead" of it. If pain gets out of control, it is difficult to get it back under control. Local anesthesia that lasts 6-8 hours is used to numb the foot and decrease pain.  For the best pain control, take the pain medication every 4 hours for the first 2 days post op. On the third day pain medication can be taken as needed.   Post Op Nausea Nausea is common after surgery, so it is managed proactively.  If prescribed, use the prescribed nausea medication regularly for the first 2 days post op.  Bandages Do not worry if there is blood on the bandage. What looks like a lot of blood on the bandage is actually a small amount. Blood on the dressing spreads out as it is absorbed by the gauze, the same way a drop of water spreads out on a paper towel.  If the bandages feel wet or dry, stiff and uncomfortable, call the office during office hours and we will schedule a time for you to have the bandage changed.  Unless you are specifically told otherwise, we will do the first bandage change in the office.  Keep your bandage dry. If the bandage becomes wet or soiled, notify the office and we will schedule a time to change the bandage.  Activity It is best to spend most of the first 2 days after surgery lying down with the foot elevated above the level of your heart. You may put weight on your heel while wearing the CAM Walker (black boot).   You may only get up to go to the restroom.  Driving Do not drive until you are able to respond in an emergency (i.e. slam on the brakes). This usually occurs after the bone has healed - 6 to 8 weeks.  Call the Office If you have a fever over 101F.  If you have increasing pain after the initial post op pain has settled down.  If you have increasing redness, swelling,  or drainage.  If you have any questions or concerns.    General Anesthesia, Adult, Care After This sheet gives you information about how to care for yourself after your procedure. Your health care provider may also give you more specific instructions. If you have problems or questions, contact your health care provider. What can I expect after the procedure? After the procedure, the following side effects are common:  Pain or discomfort at the IV site.  Nausea.  Vomiting.  Sore throat.  Trouble concentrating.  Feeling cold or chills.  Weak or tired.  Sleepiness and fatigue.  Soreness and body aches. These side effects can affect parts of the body that were not involved in surgery. Follow these instructions at home:  For at least 24 hours after the procedure:  Have a responsible adult stay with you. It is important to have someone help care for you until you are awake and alert.  Rest as needed.  Do not: ? Participate in activities in which you could fall or become injured. ? Drive. ? Use heavy machinery. ? Drink alcohol. ? Take sleeping pills or medicines that cause drowsiness. ? Make important decisions or sign legal documents. ? Take care of children on your own. Eating and drinking  Follow any instructions from your health care provider  about eating or drinking restrictions.  When you feel hungry, start by eating small amounts of foods that are soft and easy to digest (bland), such as toast. Gradually return to your regular diet.  Drink enough fluid to keep your urine pale yellow.  If you vomit, rehydrate by drinking water, juice, or clear broth. General instructions  If you have sleep apnea, surgery and certain medicines can increase your risk for breathing problems. Follow instructions from your health care provider about wearing your sleep device: ? Anytime you are sleeping, including during daytime naps. ? While taking prescription pain medicines,  sleeping medicines, or medicines that make you drowsy.  Return to your normal activities as told by your health care provider. Ask your health care provider what activities are safe for you.  Take over-the-counter and prescription medicines only as told by your health care provider.  If you smoke, do not smoke without supervision.  Keep all follow-up visits as told by your health care provider. This is important. Contact a health care provider if:  You have nausea or vomiting that does not get better with medicine.  You cannot eat or drink without vomiting.  You have pain that does not get better with medicine.  You are unable to pass urine.  You develop a skin rash.  You have a fever.  You have redness around your IV site that gets worse. Get help right away if:  You have difficulty breathing.  You have chest pain.  You have blood in your urine or stool, or you vomit blood. Summary  After the procedure, it is common to have a sore throat or nausea. It is also common to feel tired.  Have a responsible adult stay with you for the first 24 hours after general anesthesia. It is important to have someone help care for you until you are awake and alert.  When you feel hungry, start by eating small amounts of foods that are soft and easy to digest (bland), such as toast. Gradually return to your regular diet.  Drink enough fluid to keep your urine pale yellow.  Return to your normal activities as told by your health care provider. Ask your health care provider what activities are safe for you. This information is not intended to replace advice given to you by your health care provider. Make sure you discuss any questions you have with your health care provider. Document Revised: 08/30/2017 Document Reviewed: 04/12/2017 Elsevier Patient Education  [place].  Arthroscopic Ankle Fusion, Care After This sheet gives you information about how to care for yourself after  your procedure. Your health care provider may also give you more specific instructions. If you have problems or questions, contact your health care provider. What can I expect after the procedure? After the procedure, it is common to have:  Pain.  Swelling.  A cast, splint, or boot on your foot and lower leg. Follow these instructions at home: If you have a cast:  Do not stick anything inside the cast to scratch your skin. Doing that increases your risk of infection.  Check the skin around the cast every day. Tell your health care provider about any concerns.  You may put lotion on dry skin around the edges of the cast. Do not put lotion on the skin underneath the cast.  Keep the cast clean.  If the cast is not waterproof: ? Do not let it get wet. ? Cover it with a watertight covering when you take  a bath or shower. If you have a splint or boot:  Wear the splint or boot as told by your health care provider. Remove it only as told by your health care provider.  Loosen the splint or boot if your toes tingle, become numb, or turn cold and blue.  Keep the splint or boot clean.  If the splint or boot is not waterproof: ? Do not let it get wet. ? Cover it with a watertight covering when you take a bath or a shower. Incision care   Follow instructions from your health care provider about how to take care of your incisions. Make sure you: ? Wash your hands with soap and water before you change your bandage (dressing). If soap and water are not available, use hand sanitizer. ? Change your dressing as told by your health care provider. ? Leave stitches (sutures), skin glue, or adhesive strips in place. These skin closures may need to stay in place for 2 weeks or longer. If adhesive strip edges start to loosen and curl up, you may trim the loose edges. Do not remove adhesive strips completely unless your health care provider tells you to do that.  After your cast, splint, or boot has  been removed, check your incision areas every day for signs of infection. Check for: ? More redness, swelling, or pain. ? Fluid or blood. ? Warmth. ? Pus or a bad smell.  Do not take baths, swim, or use a hot tub until your health care provider approves. Ask your health care provider if you may take showers. You may only be allowed to take sponge baths. Managing pain, stiffness, and swelling   If directed, put ice on the injured area. ? If you have a removable splint or boot, remove it as told by your health care provider. ? Put ice in a plastic bag. ? Place a towel between your skin and the bag or between your cast and the bag. ? Leave the ice on for 20 minutes, 2-3 times per day.  Move your toes often to avoid stiffness and to lessen swelling.  Raise (elevate) the injured area above the level of your heart while you are sitting or lying down. Medicines  Take over-the-counter and prescription medicines only as told by your health care provider.  If you were prescribed pain medicine, take steps to prevent or treat constipation. Your health care provider may recommend that you: ? Take over-the-counter or prescription medicines. ? Eat foods that are high in fiber, such as beans, whole grains, and fresh fruits and vegetables. ? Limit foods that are high in fat and processed sugars, such as fried or sweet foods. Driving  Do not drive or use heavy machinery while taking prescription pain medicine.  Do not drive for 24 hours if you were given a sedative during your procedure.  Ask your health care provider when it is safe to drive if you have a cast, splint, or boot. Safety  Do not use the injured limb to support your body weight until your health care provider says that you can. Use crutchesor a walker as told by your health care provider.  To prevent falls, remove all throw rugs, electric cords, or other trip hazards from your house. General instructions  If you have a cast or  splint, do not put pressure on any part of the cast or splint until it is fully hardened. This may take several hours.  Do not use any products that contain nicotine  or tobacco, such as cigarettes, e-cigarettes, and chewing tobacco. These can delay bone healing after surgery. If you need help quitting, ask your health care provider.  Drink enough fluid to keep your urine pale yellow.  Return to your normal activities as told by your health care provider. Ask your health care provider what activities are safe for you.  Keep all follow-up visits as told by your health care provider. This is important. Contact a health care provider if:  You have: ? A fever. ? More redness, swelling, or pain around your incisions. ? Fluid or blood coming from your incisions. ? Pus or a bad smell coming from your incisions.  Your incisions feel warm to the touch. Get help right away if:  You have: ? Chest pain. ? Difficulty breathing. ? A severe increase in pain or swelling. ? Pain, tenderness, or redness in your calf.  Your toes tingle or become blue, numb, or very cold. Summary  After your procedure, it is common to have pain and swelling in your ankle.  Follow your health care provider's instructions if you have a cast, splint, or boot.  Take steps to treat swelling and pain. You may need to put ice on your ankle and elevate it above the level of your heart.  Contact a health care provider if you have a fever or more redness, swelling, or pain around your incisions.  Get help right away if you have chest pain, difficulty breathing, or severe pain and swelling. These may be signs of a serious problem. This information is not intended to replace advice given to you by your health care provider. Make sure you discuss any questions you have with your health care provider. Document Revised: 01/27/2018 Document Reviewed: 01/27/2018 Elsevier Patient Education  [place].

## 2020-08-24 NOTE — Anesthesia Postprocedure Evaluation (Signed)
Anesthesia Post Note  Patient: Kayla [name]  Procedure(s) Performed: ARTHRODESIS OF THE METATARSOPHALGEAL JOINT RIGHT FOOT (Right Toe)  Patient location during evaluation: Phase II Anesthesia Type: General Level of consciousness: awake Pain management: pain level controlled Vital Signs Assessment: post-procedure vital signs reviewed and stable Respiratory status: spontaneous breathing Cardiovascular status: blood pressure returned to baseline Postop Assessment: no headache Anesthetic complications: no   No complications documented.   Last Vitals:  Vitals:   08/24/20 0945 08/24/20 1002  BP: (!) 135/92 113/72  Pulse: 72 72  Resp: 13 16  Temp:  36.4 C  SpO2: 100% 99%    Last Pain:  Vitals:   08/24/20 1002  TempSrc: Oral  PainSc: 0-No pain                 Louann Sjogren

## 2020-08-24 NOTE — Transfer of Care (Signed)
Immediate Anesthesia Transfer of Care Note  Patient: Kayla [name]  Procedure(s) Performed: ARTHRODESIS OF THE METATARSOPHALGEAL JOINT RIGHT FOOT (Right Toe)  Patient Location: PACU  Anesthesia Type:MAC  Level of Consciousness: awake  Airway & Oxygen Therapy: Patient Spontanous Breathing  Post-op Assessment: Report given to RN  Post vital signs: Reviewed  Last Vitals:  Vitals Value Taken Time  BP 129/76 08/24/20 0923  Temp 36.7 C 08/24/20 0923  Pulse 74 08/24/20 0922  Resp 9 08/24/20 0922  SpO2 97 % 08/24/20 0922  Vitals shown include unvalidated device data.  Last Pain:  Vitals:   08/24/20 0651  TempSrc: Oral  PainSc: 3       Patients Stated Pain Goal: 9 (36/62/94 [id number])  Complications: No complications documented.

## 2020-08-24 NOTE — H&P (Signed)
HISTORY AND PHYSICAL INTERVAL NOTE:  08/24/2020  7:15 AM  Kayla [name]  has presented today for surgery, with the diagnosis of hallux limitus of the right foot.  The various methods of treatment have been discussed with the patient.  No guarantees were given.  After consideration of risks, benefits and other options for treatment, the patient has consented to surgery.  I have reviewed the patients' chart and labs.    Patient Vitals for the past 24 hrs:  BP Temp Temp src Resp SpO2  08/24/20 0651 124/85 98.2 F (36.8 C) Oral 18 96 %    A history and physical examination was performed in my office.  The patient was reexamined.  There have been no changes to this history and physical examination.  Marcheta Grammes, DPM

## 2020-08-24 NOTE — Op Note (Signed)
OPERATIVE NOTE  DATE OF PROCEDURE 08/24/2020  SURGEON Marcheta Grammes, DPM  ASSISTANT SURGEON None  OR STAFF Circulator: Dechert, Loralyn Freshwater, RN Radiology Technologist: Penny Pia Scrub Person: Karin Lieu, CST Vendor Representative : Georgiana Spinner   PREOPERATIVE DIAGNOSIS 1.  Hallux limitus, right foot 2.  Pain, right foot.  POSTOPERATIVE DIAGNOSIS Same  PROCEDURE Arthrodesis of the first metatarsophalangeal joint, right foot  ANESTHESIA Monitor Anesthesia Care   HEMOSTASIS Pneumatic ankle tourniquet set at 250 mmHg  ESTIMATED BLOOD LOSS Minimal (<5 cc)  MATERIALS USED [employer] first metatarsophalangeal joint plate  INJECTABLES 0.9% Marcaine plain  PATHOLOGY None  COMPLICATIONS None  INDICATIONS: Chronic pain in right great toe joint that has been getting worse.  Her symptoms have failed to respond to nonsurgical care.  DESCRIPTION OF THE PROCEDURE: The patient was brought to the operating room and placed on the operative table in the supine position.  A pneumatic ankle tourniquet was placed about the patient's right ankle.  A timeout was performed.  The foot was anesthetized with 0.5% Marcaine plain.  A second timeout was performed.  The foot was elevated, exsanguinated and the pneumatic ankle tourniquet was inflated to 250 mmHg.  Attention was directed to the dorsal medial aspect of the right foot where a linear longitudinal incision was made medial and parallel to the extensor hallucis longus tendon.  Dissection was continued deep down to the level of the first metatarsophalangeal joint capsule.  A linear periosteal and capsular incision was made.  The periosteal and capsular structures were reflected medial and laterally exposing the first metatarsophalangeal joint.  Significant degenerative intra-articular changes were noted.  There was dorsal spurring of the first metatarsal head.  The dorsal spurring was resected using a bone rongeur.  The  corresponding articular surfaces of the first metatarsal head and base of the proximal phalanx were reamed.  A 0.062 inch Kirschner wire was used to fenestrate the subchondral bone to promote bony ingrowth.  The first metatarsophalangeal joint was fixated using a [employer] first metatarsophalangeal joint plate with corresponding staple and screws.  Position of the first metatarsophalangeal joint and hardware were evaluated with fluoroscopy.  There was good bone to bone opposition and alignment of the first metatarsal phalangeal joint.  The hardware was noted to be in good alignment.  The surgical wound was irrigated with copious amounts of sterile irrigant.  The periosteal and capsular structures were reapproximated using 3-0 Vicryl.  The subcutaneous structures were reapproximated using 4-0 Vicryl.  The skin was reapproximated using 4-0 Prolene.  Steri-Strips were applied.  Additional 0.5% Marcaine plain was injected proximal to the surgical site to provide postoperative pain control.  A sterile compressive dressing was applied to the right foot.  The pneumatic ankle tourniquet was deflated and a prompt hyperemic response was noted to all digits of the right foot.  The patient tolerated the procedure and anesthesia well.  She was transferred from the operating room to the postanesthesia care unit with vital signs stable and vascular status intact to all digits of the right foot.

## 2020-08-24 NOTE — Brief Op Note (Signed)
BRIEF OPERATIVE NOTE  DATE OF PROCEDURE 08/24/2020  SURGEON Marcheta Grammes, DPM  ASSISTANT SURGEON None  OR STAFF Circulator: Dechert, Loralyn Freshwater, RN Radiology Technologist: Penny Pia Scrub Person: Karin Lieu, CST Vendor Representative : Georgiana Spinner   PREOPERATIVE DIAGNOSIS 1.  Hallux limitus, right foot 2.  Pain, right foot.  POSTOPERATIVE DIAGNOSIS Same  PROCEDURE Arthrodesis of the first metatarsophalangeal joint, right foot  ANESTHESIA Monitor Anesthesia Care   HEMOSTASIS Pneumatic ankle tourniquet set at 250 mmHg  ESTIMATED BLOOD LOSS Minimal (<5 cc)  MATERIALS USED [employer] first metatarsophalangeal joint plate  INJECTABLES 2.5% Marcaine plain  PATHOLOGY None  COMPLICATIONS None

## 2020-08-24 NOTE — Anesthesia Preprocedure Evaluation (Signed)
Anesthesia Evaluation  Patient identified by MRN, date of birth, ID band Patient awake    Reviewed: Allergy & Precautions, H&P , NPO status , Patient's Chart, lab work & pertinent test results, reviewed documented beta blocker date and time   History of Anesthesia Complications (+) PROLONGED EMERGENCE and history of anesthetic complications  Airway Mallampati: II  TM Distance: >3 FB Neck ROM: full    Dental no notable dental hx.    Pulmonary asthma , sleep apnea ,    Pulmonary exam normal breath sounds clear to auscultation       Cardiovascular Exercise Tolerance: Good hypertension,  Rhythm:regular Rate:Normal     Neuro/Psych  Headaches, PSYCHIATRIC DISORDERS Anxiety Depression  Neuromuscular disease    GI/Hepatic negative GI ROS, Neg liver ROS,   Endo/Other  negative endocrine ROSdiabetes  Renal/GU negative Renal ROS  negative genitourinary   Musculoskeletal   Abdominal   Peds  Hematology negative hematology ROS (+)   Anesthesia Other Findings   Reproductive/Obstetrics negative OB ROS                             Anesthesia Physical Anesthesia Plan  ASA: III  Anesthesia Plan: General   Post-op Pain Management:    Induction:   PONV Risk Score and Plan: Propofol infusion  Airway Management Planned:   Additional Equipment:   Intra-op Plan:   Post-operative Plan:   Informed Consent: I have reviewed the patients History and Physical, chart, labs and discussed the procedure including the risks, benefits and alternatives for the proposed anesthesia with the patient or authorized representative who has indicated his/her understanding and acceptance.     Dental Advisory Given  Plan Discussed with: CRNA  Anesthesia Plan Comments:         Anesthesia Quick Evaluation

## 2020-08-25 ENCOUNTER — Encounter (HOSPITAL_COMMUNITY): Payer: Self-pay | Admitting: Podiatry

## 2020-09-13 DIAGNOSIS — Z4889 Encounter for other specified surgical aftercare: Secondary | ICD-10-CM | POA: Diagnosis not present

## 2020-09-27 DIAGNOSIS — Z4889 Encounter for other specified surgical aftercare: Secondary | ICD-10-CM | POA: Diagnosis not present

## 2020-10-25 DIAGNOSIS — Z4889 Encounter for other specified surgical aftercare: Secondary | ICD-10-CM | POA: Diagnosis not present

## 2020-11-01 ENCOUNTER — Other Ambulatory Visit: Payer: Self-pay

## 2020-11-01 ENCOUNTER — Encounter: Payer: Self-pay | Admitting: Internal Medicine

## 2020-11-01 ENCOUNTER — Telehealth (INDEPENDENT_AMBULATORY_CARE_PROVIDER_SITE_OTHER): Payer: Managed Care, Other (non HMO) | Admitting: Internal Medicine

## 2020-11-01 VITALS — Wt 200.0 lb

## 2020-11-01 DIAGNOSIS — E7800 Pure hypercholesterolemia, unspecified: Secondary | ICD-10-CM | POA: Diagnosis not present

## 2020-11-01 DIAGNOSIS — G4733 Obstructive sleep apnea (adult) (pediatric): Secondary | ICD-10-CM | POA: Diagnosis not present

## 2020-11-01 DIAGNOSIS — M159 Polyosteoarthritis, unspecified: Secondary | ICD-10-CM

## 2020-11-01 DIAGNOSIS — I10 Essential (primary) hypertension: Secondary | ICD-10-CM

## 2020-11-01 DIAGNOSIS — E559 Vitamin D deficiency, unspecified: Secondary | ICD-10-CM | POA: Diagnosis not present

## 2020-11-01 DIAGNOSIS — M8949 Other hypertrophic osteoarthropathy, multiple sites: Secondary | ICD-10-CM | POA: Diagnosis not present

## 2020-11-01 DIAGNOSIS — E119 Type 2 diabetes mellitus without complications: Secondary | ICD-10-CM | POA: Diagnosis not present

## 2020-11-01 DIAGNOSIS — F339 Major depressive disorder, recurrent, unspecified: Secondary | ICD-10-CM

## 2020-11-01 NOTE — Progress Notes (Signed)
Virtual Visit via Telephone Note  I connected with Kayla [name] on 11/01/20 at  9:00 AM EST by telephone and verified that I am speaking with the correct person using two identifiers.   I discussed the limitations, risks, security and privacy concerns of performing an evaluation and management service by telephone and the availability of in person appointments. I also discussed with the patient that there may be a patient responsible charge related to this service. The patient expressed understanding and agreed to proceed.  Location patient: home Location provider: work office Participants present for the call: patient, provider Patient did not have a visit in the prior 7 days to address this/these issue(s).   History of Present Illness:  This is a scheduled visit for follow up of chronic medical conditions. Her PMH is significant for: Hypertension, diabetes, hyperlipidemia, morbid obesity, sleep apnea as well as vit D def. She has no acute concerns today. She had her COVID booster and flu vaccine in January. She had a big toe fusion due to severe OA since I last saw her; has recovered well. She has not been checking her BPs or CBGs. She is compliant with her medications. She has gained 6 pounds and is working on a healthier lifestyle.   Observations/Objective: Patient sounds cheerful and well on the phone. I do not appreciate any increased work of breathing. Speech and thought processing are grossly intact. Patient reported vitals: none reported   Current Outpatient Medications:  .  albuterol (PROVENTIL HFA) 108 (90 Base) MCG/ACT inhaler, Inhale 2 puffs into the lungs every 6 (six) hours as needed for wheezing or shortness of breath., Disp: 18 g, Rfl: 3 .  atorvastatin (LIPITOR) 20 MG tablet, Take 1 tablet (20 mg total) by mouth daily., Disp: 90 tablet, Rfl: 1 .  empagliflozin (JARDIANCE) 25 MG TABS tablet, TAKE 1 TABLET BY MOUTH ONCE DAILY (Patient taking differently: Take  25 mg by mouth daily. TAKE 1 TABLET BY MOUTH ONCE DAILY), Disp: 90 tablet, Rfl: 1 .  fluticasone furoate-vilanterol (BREO ELLIPTA) 100-25 MCG/INH AEPB, Inhale 1 puff into the lungs daily., Disp: 2 each, Rfl: 0 .  lisinopril (ZESTRIL) 10 MG tablet, Take 1 tablet (10 mg total) by mouth daily., Disp: 90 tablet, Rfl: 1 .  loperamide (IMODIUM) 2 MG capsule, Take 4 mg by mouth as needed for diarrhea or loose stools., Disp: , Rfl:  .  LORazepam (ATIVAN) 0.5 MG tablet, Take 1 tablet (0.5 mg total) by mouth every 8 (eight) hours as needed. for anxiety (Patient taking differently: Take 0.5 mg by mouth every 8 (eight) hours as needed for anxiety. for anxiety), Disp: 60 tablet, Rfl: 5 .  meloxicam (MOBIC) 15 MG tablet, Take 1 tablet (15 mg total) by mouth daily., Disp: 90 tablet, Rfl: 1 .  metFORMIN (GLUCOPHAGE-XR) 500 MG 24 hr tablet, Take 1 tablet (500 mg total) by mouth in the morning and at bedtime., Disp: 180 tablet, Rfl: 1 .  OVER THE COUNTER MEDICATION, Take 1 capsule by mouth daily. Vitamin A / Zinc, Disp: , Rfl:  .  oxybutynin (DITROPAN XL) 15 MG 24 hr tablet, TAKE 1 TABLET BY MOUTH ONCE DAILY (Patient taking differently: Take 15 mg by mouth daily. TAKE 1 TABLET BY MOUTH ONCE DAILY), Disp: 90 tablet, Rfl: 1 .  rOPINIRole (REQUIP) 0.25 MG tablet, TAKE 1 TO 2 TABLETS BY MOUTH BEFORE BEDTIME FOR  RESTLESS  LEG (Patient taking differently: Take 0.25-0.5 mg by mouth at bedtime as needed (RLS). TAKE 1  TO 2 TABLETS BY MOUTH BEFORE BEDTIME FOR  RESTLESS  LEG), Disp: 30 tablet, Rfl: 12 .  sertraline (ZOLOFT) 100 MG tablet, TAKE 1 TABLET BY MOUTH ONCE DAILY - PATIENT NEEDS AN APPT (Patient taking differently: Take 100 mg by mouth daily. TAKE 1 TABLET BY MOUTH ONCE DAILY - PATIENT NEEDS AN APPT), Disp: 90 tablet, Rfl: 1 .  venlafaxine (EFFEXOR) 75 MG tablet, Take 1 tablet by mouth once daily (Patient taking differently: Take 75 mg by mouth daily.), Disp: 90 tablet, Rfl: 1 .  vitamin B-12 (CYANOCOBALAMIN) 1000 MCG  tablet, Take 1,000 mcg by mouth daily., Disp: , Rfl:   Review of Systems:  Constitutional: Denies fever, chills, diaphoresis, appetite change and fatigue.  HEENT: Denies photophobia, eye pain, redness, hearing loss, ear pain, congestion, sore throat, rhinorrhea, sneezing, mouth sores, trouble swallowing, neck pain, neck stiffness and tinnitus.   Respiratory: Denies SOB, DOE, cough, chest tightness,  and wheezing.   Cardiovascular: Denies chest pain, palpitations and leg swelling.  Gastrointestinal: Denies nausea, vomiting, abdominal pain, diarrhea, constipation, blood in stool and abdominal distention.  Genitourinary: Denies dysuria, urgency, frequency, hematuria, flank pain and difficulty urinating.  Endocrine: Denies: hot or cold intolerance, sweats, changes in hair or nails, polyuria, polydipsia. Musculoskeletal: Denies myalgias, back pain, joint swelling, arthralgias and gait problem.  Skin: Denies pallor, rash and wound.  Neurological: Denies dizziness, seizures, syncope, weakness, light-headedness, numbness and headaches.  Hematological: Denies adenopathy. Easy bruising, personal or family bleeding history  Psychiatric/Behavioral: Denies suicidal ideation, mood changes, confusion, nervousness, sleep disturbance and agitation   Assessment and Plan:  Pure hypercholesterolemia -Last LDL was 94 in 8/21. Goal is <70. - At that time, lipitor 20 mg was added. -Recheck lipids at next in-person visit.  HYPERTENSION, BENIGN ESSENTIAL -She is compliant with medications. -Unfortunately, does not have recent BP measurements to share.  Depression, recurrent ([place]) -Mood is stable, continue sertraline 100 mg and venlafaxine 75 mg daily. She also uses ativan PRN for anxiety. Is not requesting refills today.  Primary osteoarthritis involving multiple joints -S/p great toe fusion recently by podiatry.  Diabetes mellitus with coincident hypertension ([place]) -Last A1c was 6.6 in 8/21. -Due for  recheck A1c. -Does not follow CBGs routinely at home.  Vitamin D deficiency -Recheck levels at her next in-person visit.  Obstructive sleep apnea -Continue with nightly CPAP.  Morbid obesity ([place]) -Discussed healthy lifestyle, including increased physical activity and better food choices to promote weight loss.     I discussed the assessment and treatment plan with the patient. The patient was provided an opportunity to ask questions and all were answered. The patient agreed with the plan and demonstrated an understanding of the instructions.   The patient was advised to call back or seek an in-person evaluation if the symptoms worsen or if the condition fails to improve as anticipated.  I provided 22 minutes of non-face-to-face time during this encounter.   Lelon Frohlich, MD [facility] at [facility]

## 2020-11-22 ENCOUNTER — Other Ambulatory Visit: Payer: Self-pay | Admitting: Internal Medicine

## 2020-11-22 DIAGNOSIS — I10 Essential (primary) hypertension: Secondary | ICD-10-CM

## 2020-11-22 DIAGNOSIS — E119 Type 2 diabetes mellitus without complications: Secondary | ICD-10-CM

## 2020-11-22 DIAGNOSIS — G4700 Insomnia, unspecified: Secondary | ICD-10-CM

## 2020-11-22 DIAGNOSIS — F339 Major depressive disorder, recurrent, unspecified: Secondary | ICD-10-CM

## 2020-11-29 ENCOUNTER — Encounter: Payer: Self-pay | Admitting: Internal Medicine

## 2020-11-29 MED ORDER — BREO ELLIPTA 100-25 MCG/INH IN AEPB: INHALATION_SPRAY | RESPIRATORY_TRACT | 2 refills | Status: DC

## 2021-01-13 ENCOUNTER — Other Ambulatory Visit: Payer: Self-pay | Admitting: Internal Medicine

## 2021-01-13 DIAGNOSIS — E119 Type 2 diabetes mellitus without complications: Secondary | ICD-10-CM

## 2021-02-13 ENCOUNTER — Other Ambulatory Visit: Payer: Self-pay | Admitting: Internal Medicine

## 2021-02-13 DIAGNOSIS — E7800 Pure hypercholesterolemia, unspecified: Secondary | ICD-10-CM

## 2021-02-25 ENCOUNTER — Other Ambulatory Visit: Payer: Self-pay | Admitting: Internal Medicine

## 2021-02-25 DIAGNOSIS — I10 Essential (primary) hypertension: Secondary | ICD-10-CM

## 2021-02-25 DIAGNOSIS — E119 Type 2 diabetes mellitus without complications: Secondary | ICD-10-CM

## 2021-02-26 ENCOUNTER — Other Ambulatory Visit: Payer: Self-pay | Admitting: Internal Medicine

## 2021-02-26 DIAGNOSIS — G4700 Insomnia, unspecified: Secondary | ICD-10-CM

## 2021-03-02 ENCOUNTER — Ambulatory Visit: Payer: Managed Care, Other (non HMO) | Admitting: Internal Medicine

## 2021-03-11 NOTE — Progress Notes (Signed)
Subjective:    Patient ID: Kayla [name], female    DOB: 10-29-1960, 60 y.o.   MRN: [id number]  HPI female never smoker followed for OSA/Insomnia, Asthma, allergic rhinitis complicated by HBP, depression, DM NPSG 01/30/11-AHI 19.9/hour, desaturation to 88%, CPAP titration to 9, body weight 230 pounds FENO 01/04/17- 42 H Office Spirometry 01/04/17-mild restriction of exhaled volume. FVC 2.51/78%, FEV1 2.06/82%, ratio 0.82, FEF25-75% 2.27/95% -------------------------------------------------------------------------------------------   03/01/20- 60 yo female never smoker followed for OSA/Insomnia, Restless Legs, Asthma, Allergic Rhinitis,  complicated by HBP, depression, DM2, CPAP auto 5-15/ [employer] Download compliance 87%, AHI 2.1/ hr Body weight today - 205 lbs Albuterol hfa, Breo 100, Requip 0.25 ------needs restless leg med. Needs Breo sample. Using albuterol rescue about 1x/ week now in humid weather, otherwise less with no significant episodes. Memory Dance is expensive. Discussed trial Symbicort as alternative. Using Requip 1 tab, 1-3x/ week at bedtime. Doing fine with CPAP- no concerns. Has 2 Moderna covax.  03/14/21- 60 yo female never smoker followed for OSA/Insomnia, Restless Legs, Asthma, Allergic Rhinitis,  complicated by HBP, depression, DM2, -Albuterol hfa, Breo 100, Requip 0.25 CPAP auto 5-15/ [employer]    AirSense 10 Autoset Download-compliance 70%, AHI 2.7. hr     Some short nights Body weight today- Covid vax- -----Wears CPAP-6-8 hrs. Each night.Sob increased x 2 mths., cough-clear, occass. Wheezing, pnd ACT score 15 Breo inhaler works well but expensive.  Increased wheeze during the summer months, with some postnasal drip.  ROS-see HPI            + = positive Constitutional:   No-   weight loss, night sweats, fevers, chills, fatigue, lassitude. HEENT:   No-  headaches, difficulty swallowing, tooth/dental problems, sore throat,       No-  sneezing, itching, ear ache, +nasal  congestion, post nasal drip,  CV:  No-   chest pain, orthopnea, PND, swelling in lower extremities, anasarca,  dizziness, palpitations Resp:  No hemoptysis  Skin: No-   rash or lesions. GI:  No-   heartburn, indigestion, abdominal pain, nausea, vomiting,  GU: . MS:  No-   joint pain or swelling.  . Neuro-     nothing unusual Psych:  No- change in mood or affect. No depression or anxiety.  No memory loss.  OBJ- Physical Exam General- Alert, Oriented, Affect-appropriate, Distress- none acute, + overweight Skin- rash-none, lesions- none, excoriation- none Lymphadenopathy- none Head- atraumatic            Eyes- Gross vision intact, PERRLA, conjunctivae and secretions clear            Ears- Hearing, canals-normal            Nose- Clear, no-Septal dev, mucus, polyps, erosion, perforation             Throat- Mallampati III , mucosa clear , drainage- none, tonsils- atrophic Neck- flexible , trachea midline, no stridor , thyroid nl, carotid no bruit Chest - symmetrical excursion , unlabored           Heart/CV- RRR , no murmur , no gallop  , no rub, nl s1 s2                           - JVD- none , edema- none, stasis changes- none, varices- none           Lung- clear to P&A, wheeze- none, cough- none , dullness-none, rub- none  Chest wall-  Abd-  Br/ Gen/ Rectal- Not done, not indicated Extrem- cyanosis- none, clubbing, none, atrophy- none, strength- nl Neuro- grossly intact to observation

## 2021-03-14 ENCOUNTER — Ambulatory Visit (INDEPENDENT_AMBULATORY_CARE_PROVIDER_SITE_OTHER): Payer: Managed Care, Other (non HMO) | Admitting: Internal Medicine

## 2021-03-14 ENCOUNTER — Encounter: Payer: Self-pay | Admitting: Internal Medicine

## 2021-03-14 ENCOUNTER — Other Ambulatory Visit: Payer: Self-pay

## 2021-03-14 DIAGNOSIS — J4520 Mild intermittent asthma, uncomplicated: Secondary | ICD-10-CM | POA: Diagnosis not present

## 2021-03-14 DIAGNOSIS — F5101 Primary insomnia: Secondary | ICD-10-CM | POA: Diagnosis not present

## 2021-03-14 DIAGNOSIS — G4733 Obstructive sleep apnea (adult) (pediatric): Secondary | ICD-10-CM

## 2021-03-14 MED ORDER — CLONAZEPAM 0.5 MG PO TABS: ORAL_TABLET | ORAL | 5 refills | Status: DC

## 2021-03-14 MED ORDER — BUDESONIDE-FORMOTEROL FUMARATE 160-4.5 MCG/ACT IN AERO: INHALATION_SPRAY | RESPIRATORY_TRACT | 6 refills | Status: DC

## 2021-03-14 NOTE — Patient Instructions (Signed)
We can continue CPAP  5-15  Script sent to try Symbicort 160   inhale 2 puffs, then rinse mouth, twice daily Compare this with Breo.  Order- sample x 2 Breo inhaler.  You can use this for now, but then try the Symbicort  Script sent to try clonazepam for sleep. Take one, about 20 minutes before you want to go to sleep.

## 2021-03-30 ENCOUNTER — Other Ambulatory Visit: Payer: Self-pay | Admitting: Internal Medicine

## 2021-03-30 DIAGNOSIS — E119 Type 2 diabetes mellitus without complications: Secondary | ICD-10-CM

## 2021-04-07 ENCOUNTER — Other Ambulatory Visit: Payer: Self-pay

## 2021-04-07 ENCOUNTER — Encounter: Payer: Self-pay | Admitting: Internal Medicine

## 2021-04-07 ENCOUNTER — Ambulatory Visit (INDEPENDENT_AMBULATORY_CARE_PROVIDER_SITE_OTHER): Payer: Managed Care, Other (non HMO) | Admitting: Internal Medicine

## 2021-04-07 VITALS — BP 110/70 | HR 72 | Temp 98.1°F | Wt 210.6 lb

## 2021-04-07 DIAGNOSIS — E7800 Pure hypercholesterolemia, unspecified: Secondary | ICD-10-CM

## 2021-04-07 DIAGNOSIS — M79604 Pain in right leg: Secondary | ICD-10-CM

## 2021-04-07 DIAGNOSIS — E119 Type 2 diabetes mellitus without complications: Secondary | ICD-10-CM

## 2021-04-07 DIAGNOSIS — E559 Vitamin D deficiency, unspecified: Secondary | ICD-10-CM

## 2021-04-07 DIAGNOSIS — I10 Essential (primary) hypertension: Secondary | ICD-10-CM

## 2021-04-07 LAB — COMPREHENSIVE METABOLIC PANEL
ALT: 19 U/L (ref 0–35)
AST: 17 U/L (ref 0–37)
Albumin: 4.5 g/dL (ref 3.5–5.2)
Alkaline Phosphatase: 101 U/L (ref 39–117)
BUN: 16 mg/dL (ref 6–23)
CO2: 29 mEq/L (ref 19–32)
Calcium: 10.1 mg/dL (ref 8.4–10.5)
Chloride: 101 mEq/L (ref 96–112)
Creatinine, Ser: 0.99 mg/dL (ref 0.40–1.20)
GFR: 61.21 mL/min (ref >60.00)
Glucose, Bld: 114 mg/dL — ABNORMAL HIGH (ref 70–99)
Potassium: 4.3 mEq/L (ref 3.5–5.1)
Sodium: 138 mEq/L (ref 135–145)
Total Bilirubin: 0.5 mg/dL (ref 0.2–1.2)
Total Protein: 7.2 g/dL (ref 6.0–8.3)

## 2021-04-07 LAB — POCT GLYCOSYLATED HEMOGLOBIN (HGB A1C): Hemoglobin A1C: 6.5 % — AB (ref 4.0–5.6)

## 2021-04-07 LAB — MAGNESIUM: Magnesium: 2.3 mg/dL (ref 1.5–2.5)

## 2021-04-07 MED ORDER — BLOOD GLUCOSE MONITOR KIT: PACK | 0 refills | Status: DC

## 2021-04-07 NOTE — Progress Notes (Signed)
Established Patient Office Visit     This visit occurred during the SARS-CoV-2 public health emergency.  Safety protocols were in place, including screening questions prior to the visit, additional usage of staff PPE, and extensive cleaning of exam room while observing appropriate contact time as indicated for disinfecting solutions.    CC/Reason for Visit: Follow-up chronic conditions and acute concern  HPI: Kayla [name] is a 60 y.o. female who is coming in today for the above mentioned reasons. Past Medical History is significant for: Hypertension, hyperlipidemia, type 2 diabetes, morbid obesity, obstructive sleep apnea and vitamin D deficiency.  She is here today for routine follow-up of her chronic medical conditions.  She also would like to discuss some right lower leg pain.  This has been ongoing for about a week.  She does have some varicose veins.  She has no edema.  She has noticed that it is worse towards the end of the day, will feel achy.  No new physical activity.   Past Medical/Surgical History: Past Medical History:  Diagnosis Date   Asthma    Carpal tunnel syndrome of right wrist 09/8839   Complication of anesthesia    hard to wake up after surg. 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[place])    "prothrombin gene factor II" (06/05/2012); states has had no problems   Headache(784.0)    due to allergies   Heart murmur    states no problems   Hyperlipidemia    Mitral valve prolapse    OSA on CPAP    Osteoarthritis    knees   Overactive bladder    PTSD (post-traumatic stress disorder)    Type II diabetes mellitus ([place])    NIDDM    Past Surgical History:  Procedure Laterality Date   ARTHRODESIS METATARSALPHALANGEAL JOINT (MTPJ) Right 08/24/2020   Procedure: ARTHRODESIS OF THE METATARSOPHALGEAL JOINT RIGHT FOOT;  Surgeon: Caprice Beaver, DPM;   Location: [facility];  Service: Podiatry;  Laterality: Right;   BREAST BIOPSY Left 04/25/2001   CARPAL TUNNEL RELEASE Right 10/24/2012   Procedure: CARPAL TUNNEL RELEASE;  Surgeon: Ninetta Lights, MD;  Location: [facility];  Service: Orthopedics;  Laterality: Right;  RIGHT CARPAL TUNNEL RELEASE   KNEE ARTHROSCOPY Right 04/01/2008   KNEE ARTHROSCOPY W/ MENISCECTOMY Left 08/16/2011   partial medial   LASIK     bilateral   PUBOVAGINAL SLING  08/27/2001   cysto; suprapubic tube placement   SHOULDER ARTHROSCOPY W/ ROTATOR CUFF REPAIR Left 2004   TOTAL ABDOMINAL HYSTERECTOMY  08/27/2001   TOTAL KNEE ARTHROPLASTY  02/06/2012   Procedure: TOTAL KNEE ARTHROPLASTY;  Surgeon: Ninetta Lights, MD;  Location: [facility];  Service: Orthopedics;  Laterality: Right;  total knee right side   TOTAL KNEE ARTHROPLASTY  06/04/2012   Procedure: TOTAL KNEE ARTHROPLASTY;  Surgeon: Ninetta Lights, MD;  Location: [facility];  Service: Orthopedics;  Laterality: Left;   ULNAR COLLATERAL LIGAMENT REPAIR Left 11/08/2017   Procedure: REPAIR/RECONSTRUCTION LEFT THUMB ULNAR COLLATERAL LIGAMENT;  Surgeon: Daryll Brod, MD;  Location: [facility];  Service: Orthopedics;  Laterality: Left;   VAGINAL HYSTERECTOMY  1980's    Social History:  reports that she has never smoked. She has never used smokeless tobacco. She reports that she does not drink alcohol and does not use drugs.  Allergies: Allergies  Allergen Reactions  Morphine And Related Shortness Of Breath and Itching    Family History:  Family History  Problem Relation Age of Onset   Heart disease Father    COPD Mother    Heart disease Mother    Heart attack Brother    Colon cancer Neg Hx      Current Outpatient Medications:    albuterol (PROVENTIL HFA) 108 (90 Base) MCG/ACT inhaler, Inhale 2 puffs into the lungs every 6 (six) hours as needed for wheezing or shortness of breath., Disp: 18 g, Rfl: 3   atorvastatin (LIPITOR) 20 MG tablet, Take 1  tablet by mouth once daily, Disp: 30 tablet, Rfl: 1   blood glucose meter kit and supplies KIT, Dispense based on patient and insurance preference. Use up to four times daily as directed., Disp: 1 each, Rfl: 0   budesonide-formoterol (SYMBICORT) 160-4.5 MCG/ACT inhaler, Inhale 2 puffs then rinse mouth, twice daily, Disp: 1 each, Rfl: 6   clonazePAM (KLONOPIN) 0.5 MG tablet, 1 for sleep if needed, Disp: 30 tablet, Rfl: 5   fluticasone furoate-vilanterol (BREO ELLIPTA) 100-25 MCG/INH AEPB, Inhale 1 puff by mouth once daily, Disp: 60 each, Rfl: 2   JARDIANCE 25 MG TABS tablet, Take 1 tablet by mouth once daily, Disp: 30 tablet, Rfl: 0   lisinopril (ZESTRIL) 10 MG tablet, Take 1 tablet by mouth once daily, Disp: 90 tablet, Rfl: 1   loperamide (IMODIUM) 2 MG capsule, Take 4 mg by mouth as needed for diarrhea or loose stools., Disp: , Rfl:    LORazepam (ATIVAN) 0.5 MG tablet, TAKE 1 TABLET BY MOUTH EVERY 8 HOURS AS NEEDED FOR ANXIETY, Disp: 60 tablet, Rfl: 0   meloxicam (MOBIC) 15 MG tablet, Take 1 tablet by mouth once daily, Disp: 90 tablet, Rfl: 0   metFORMIN (GLUCOPHAGE-XR) 500 MG 24 hr tablet, TAKE 1 TABLET BY MOUTH IN THE MORNING AND 1 TABLET AT BEDTIME, Disp: 180 tablet, Rfl: 1   OVER THE COUNTER MEDICATION, Take 1 capsule by mouth daily. Vitamin A / Zinc, Disp: , Rfl:    oxybutynin (DITROPAN XL) 15 MG 24 hr tablet, Take 1 tablet by mouth once daily, Disp: 90 tablet, Rfl: 1   rOPINIRole (REQUIP) 0.25 MG tablet, TAKE 1 TO 2 TABLETS BY MOUTH BEFORE BEDTIME FOR  RESTLESS  LEG (Patient taking differently: Take 0.25-0.5 mg by mouth at bedtime as needed (RLS). TAKE 1 TO 2 TABLETS BY MOUTH BEFORE BEDTIME FOR  RESTLESS  LEG), Disp: 30 tablet, Rfl: 12   sertraline (ZOLOFT) 100 MG tablet, Take 1 tablet by mouth once daily, Disp: 90 tablet, Rfl: 1   venlafaxine (EFFEXOR) 75 MG tablet, Take 1 tablet by mouth once daily (Patient taking differently: Take 75 mg by mouth daily.), Disp: 90 tablet, Rfl: 1   vitamin  B-12 (CYANOCOBALAMIN) 1000 MCG tablet, Take 1,000 mcg by mouth daily., Disp: , Rfl:   Review of Systems:  Constitutional: Denies fever, chills, diaphoresis, appetite change and fatigue.  HEENT: Denies photophobia, eye pain, redness, hearing loss, ear pain, congestion, sore throat, rhinorrhea, sneezing, mouth sores, trouble swallowing, neck pain, neck stiffness and tinnitus.   Respiratory: Denies SOB, DOE, cough, chest tightness,  and wheezing.   Cardiovascular: Denies chest pain, palpitations and leg swelling.  Gastrointestinal: Denies nausea, vomiting, abdominal pain, diarrhea, constipation, blood in stool and abdominal distention.  Genitourinary: Denies dysuria, urgency, frequency, hematuria, flank pain and difficulty urinating.  Endocrine: Denies: hot or cold intolerance, sweats, changes in hair or nails, polyuria, polydipsia. Musculoskeletal: Denies  back  pain, joint swelling, arthralgias and gait problem.  Skin: Denies pallor, rash and wound.  Neurological: Denies dizziness, seizures, syncope, weakness, light-headedness, numbness and headaches.  Hematological: Denies adenopathy. Easy bruising, personal or family bleeding history  Psychiatric/Behavioral: Denies suicidal ideation, mood changes, confusion, nervousness, sleep disturbance and agitation    Physical Exam: Vitals:   04/07/21 1016  BP: 110/70  Pulse: 72  Temp: 98.1 F (36.7 C)  TempSrc: Oral  SpO2: 96%  Weight: 210 lb 9.6 oz (95.5 kg)    Body mass index is 37.31 kg/m.   Constitutional: NAD, calm, comfortable Eyes: PERRL, lids and conjunctivae normal, wears corrective lenses ENMT: Mucous membranes are moist.  Respiratory: clear to auscultation bilaterally, no wheezing, no crackles. Normal respiratory effort. No accessory muscle use.  Cardiovascular: Regular rate and rhythm, no murmurs / rubs / gallops. No extremity edema.  Skin: Bilateral lower leg varicose veins are evident Neurologic: Grossly intact and  nonfocal Psychiatric: Normal judgment and insight. Alert and oriented x 3. Normal mood.    Impression and Plan:  Diabetes mellitus with coincident hypertension ([place]) -In office A1c shows good control at 6.5, she is on metformin and Jardiance.  Pure hypercholesterolemia -Last lipid panel showed a cholesterol of 173, triglycerides 96 and LDL 94 in August 2021, goal LDL is less than 70 given her diabetes. -She is on atorvastatin 20 mg daily. -She will return for CPE for repeat labs in 3 months, may need dose escalation.  HYPERTENSION, BENIGN ESSENTIAL -Well-controlled.  Vitamin D deficiency -Recheck levels when she returns for CPE.  Morbid obesity ([place]) -Discussed healthy lifestyle, including increased physical activity and better food choices to promote weight loss.  Right leg pain -Suspect this is due to varicose veins.  She will get fitted for compression stockings. -However will check electrolytes to make sure given the medications that she is on.     Patient Instructions  -Nice seeing you today!!  -Please get fitted for compression stockings.  -Schedule follow up in 3 months for your physical. Please come in fasting that day.    Lelon Frohlich, MD [facility] at [facility]

## 2021-04-07 NOTE — Addendum Note (Signed)
Addended by: Amanda Cockayne on: 04/07/2021 10:54 AM   Modules accepted: Orders

## 2021-04-07 NOTE — Patient Instructions (Signed)
-  Nice seeing you today!!  -Please get fitted for compression stockings.  -Schedule follow up in 3 months for your physical. Please come in fasting that day.

## 2021-04-13 ENCOUNTER — Telehealth: Payer: Self-pay | Admitting: Internal Medicine

## 2021-04-13 MED ORDER — BLOOD GLUCOSE MONITOR KIT: PACK | 0 refills | Status: AC

## 2021-04-13 NOTE — Telephone Encounter (Signed)
Rx resent.

## 2021-04-13 NOTE — Telephone Encounter (Signed)
[facility] called to advise they need some clarification on the test strips and lancets that was sent in. They need to know how many their suppose to give. If it be easier they said that we can just send in a scrip for the test strips and lancets.

## 2021-04-13 NOTE — Addendum Note (Signed)
Addended by: Westley Hummer B on: 04/13/2021 02:34 PM   Modules accepted: Orders

## 2021-05-04 LAB — HM MAMMOGRAPHY

## 2021-05-12 ENCOUNTER — Other Ambulatory Visit: Payer: Self-pay | Admitting: Internal Medicine

## 2021-05-12 DIAGNOSIS — N951 Menopausal and female climacteric states: Secondary | ICD-10-CM

## 2021-05-12 DIAGNOSIS — E7800 Pure hypercholesterolemia, unspecified: Secondary | ICD-10-CM

## 2021-05-12 DIAGNOSIS — E119 Type 2 diabetes mellitus without complications: Secondary | ICD-10-CM

## 2021-05-18 ENCOUNTER — Encounter: Payer: Self-pay | Admitting: Internal Medicine

## 2021-07-03 ENCOUNTER — Other Ambulatory Visit: Payer: Self-pay

## 2021-07-04 ENCOUNTER — Ambulatory Visit (INDEPENDENT_AMBULATORY_CARE_PROVIDER_SITE_OTHER): Payer: Managed Care, Other (non HMO) | Admitting: Internal Medicine

## 2021-07-04 ENCOUNTER — Encounter: Payer: Self-pay | Admitting: Internal Medicine

## 2021-07-04 ENCOUNTER — Other Ambulatory Visit: Payer: Self-pay | Admitting: Internal Medicine

## 2021-07-04 VITALS — BP 108/60 | HR 73 | Temp 98.2°F | Ht 63.0 in | Wt 203.3 lb

## 2021-07-04 DIAGNOSIS — Z0000 Encounter for general adult medical examination without abnormal findings: Secondary | ICD-10-CM | POA: Diagnosis not present

## 2021-07-04 DIAGNOSIS — E538 Deficiency of other specified B group vitamins: Secondary | ICD-10-CM | POA: Diagnosis not present

## 2021-07-04 DIAGNOSIS — I10 Essential (primary) hypertension: Secondary | ICD-10-CM | POA: Diagnosis not present

## 2021-07-04 DIAGNOSIS — E559 Vitamin D deficiency, unspecified: Secondary | ICD-10-CM

## 2021-07-04 DIAGNOSIS — K219 Gastro-esophageal reflux disease without esophagitis: Secondary | ICD-10-CM

## 2021-07-04 DIAGNOSIS — Z23 Encounter for immunization: Secondary | ICD-10-CM

## 2021-07-04 DIAGNOSIS — E119 Type 2 diabetes mellitus without complications: Secondary | ICD-10-CM | POA: Diagnosis not present

## 2021-07-04 DIAGNOSIS — K921 Melena: Secondary | ICD-10-CM

## 2021-07-04 DIAGNOSIS — E7800 Pure hypercholesterolemia, unspecified: Secondary | ICD-10-CM

## 2021-07-04 LAB — CBC WITH DIFFERENTIAL/PLATELET
Basophils Absolute: 0 10*3/uL (ref 0.0–0.1)
Basophils Relative: 0.7 % (ref 0.0–3.0)
Eosinophils Absolute: 0.2 10*3/uL (ref 0.0–0.7)
Eosinophils Relative: 2.1 % (ref 0.0–5.0)
HCT: 42.6 % (ref 36.0–46.0)
Hemoglobin: 14.3 g/dL (ref 12.0–15.0)
Lymphocytes Relative: 40.2 % (ref 12.0–46.0)
Lymphs Abs: 2.9 10*3/uL (ref 0.7–4.0)
MCHC: 33.5 g/dL (ref 30.0–36.0)
MCV: 85.8 fl (ref 78.0–100.0)
Monocytes Absolute: 0.6 10*3/uL (ref 0.1–1.0)
Monocytes Relative: 8.4 % (ref 3.0–12.0)
Neutro Abs: 3.6 10*3/uL (ref 1.4–7.7)
Neutrophils Relative %: 48.6 % (ref 43.0–77.0)
Platelets: 246 10*3/uL (ref 150.0–400.0)
RBC: 4.97 Mil/uL (ref 3.87–5.11)
RDW: 14.1 % (ref 11.5–15.5)
WBC: 7.3 10*3/uL (ref 4.0–10.5)

## 2021-07-04 LAB — COMPREHENSIVE METABOLIC PANEL
ALT: 18 U/L (ref 0–35)
AST: 18 U/L (ref 0–37)
Albumin: 4.4 g/dL (ref 3.5–5.2)
Alkaline Phosphatase: 77 U/L (ref 39–117)
BUN: 17 mg/dL (ref 6–23)
CO2: 29 mEq/L (ref 19–32)
Calcium: 9.8 mg/dL (ref 8.4–10.5)
Chloride: 102 mEq/L (ref 96–112)
Creatinine, Ser: 1.01 mg/dL (ref 0.40–1.20)
GFR: 59.66 mL/min — ABNORMAL LOW (ref >60.00)
Glucose, Bld: 91 mg/dL (ref 70–99)
Potassium: 4.1 mEq/L (ref 3.5–5.1)
Sodium: 139 mEq/L (ref 135–145)
Total Bilirubin: 0.7 mg/dL (ref 0.2–1.2)
Total Protein: 6.8 g/dL (ref 6.0–8.3)

## 2021-07-04 LAB — TSH: TSH: 4.53 u[IU]/mL (ref 0.35–5.50)

## 2021-07-04 LAB — VITAMIN D 25 HYDROXY (VIT D DEFICIENCY, FRACTURES): VITD: 15.97 ng/mL — ABNORMAL LOW (ref 30.00–100.00)

## 2021-07-04 LAB — LIPID PANEL
Cholesterol: 166 mg/dL (ref 0–200)
HDL: 51.4 mg/dL (ref >39.00)
LDL Cholesterol: 88 mg/dL (ref 0–99)
NonHDL: 114.29
Total CHOL/HDL Ratio: 3
Triglycerides: 132 mg/dL (ref 0.0–149.0)
VLDL: 26.4 mg/dL (ref 0.0–40.0)

## 2021-07-04 LAB — HEMOGLOBIN A1C: Hgb A1c MFr Bld: 6.7 % — ABNORMAL HIGH (ref 4.6–6.5)

## 2021-07-04 LAB — VITAMIN B12: Vitamin B-12: 229 pg/mL (ref 211–911)

## 2021-07-04 MED ORDER — PANTOPRAZOLE SODIUM 40 MG PO TBEC: 40.0000 mg | DELAYED_RELEASE_TABLET | Freq: Every day | ORAL | 1 refills | Status: DC

## 2021-07-04 MED ORDER — VITAMIN D (ERGOCALCIFEROL) 1.25 MG (50000 UNIT) PO CAPS: 50000.0000 [IU] | ORAL_CAPSULE | ORAL | 0 refills | Status: AC

## 2021-07-04 NOTE — Progress Notes (Signed)
Established Patient Office Visit     This visit occurred during the SARS-CoV-2 public health emergency.  Safety protocols were in place, including screening questions prior to the visit, additional usage of staff PPE, and extensive cleaning of exam room while observing appropriate contact time as indicated for disinfecting solutions.    CC/Reason for Visit: Annual preventive exam and subsequent Medicare wellness visit, discuss acute concern  HPI: Kayla [name] is a 60 y.o. female who is coming in today for the above mentioned reasons. Past Medical History is significant for: Hypertension, hyperlipidemia, type 2 diabetes, obstructive sleep apnea, vitamin D deficiency, morbid obesity.  She has routine eye and dental care, she exercises by walking routinely, she has no perceived hearing issues.  She had a colonoscopy in 2015, mammogram in 2022, she had a Pap smear by her gynecologist back over the summer.  She is overdue for flu vaccine, PCV 13, COVID booster.  She tells me that about 2 months ago she had diarrhea for about 2 weeks.  Ever since then she has had significant epigastric pain and acid reflux.  Subsequently she had black, tarry stools for about 3 weeks, this has also improved but she continues to significant epigastric pain and reflux.   Past Medical/Surgical History: Past Medical History:  Diagnosis Date   Asthma    Carpal tunnel syndrome of right wrist 03/9891   Complication of anesthesia    hard to wake up after surg. 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[place])    "prothrombin gene factor II" (06/05/2012); states has had no problems   Headache(784.0)    due to allergies   Heart murmur    states no problems   Hyperlipidemia    Mitral valve prolapse    OSA on CPAP    Osteoarthritis    knees   Overactive bladder    PTSD (post-traumatic stress disorder)     Type II diabetes mellitus ([place])    NIDDM    Past Surgical History:  Procedure Laterality Date   ARTHRODESIS METATARSALPHALANGEAL JOINT (MTPJ) Right 08/24/2020   Procedure: ARTHRODESIS OF THE METATARSOPHALGEAL JOINT RIGHT FOOT;  Surgeon: Caprice Beaver, DPM;  Location: [facility];  Service: Podiatry;  Laterality: Right;   BREAST BIOPSY Left 04/25/2001   CARPAL TUNNEL RELEASE Right 10/24/2012   Procedure: CARPAL TUNNEL RELEASE;  Surgeon: Ninetta Lights, MD;  Location: [facility];  Service: Orthopedics;  Laterality: Right;  RIGHT CARPAL TUNNEL RELEASE   KNEE ARTHROSCOPY Right 04/01/2008   KNEE ARTHROSCOPY W/ MENISCECTOMY Left 08/16/2011   partial medial   LASIK     bilateral   PUBOVAGINAL SLING  08/27/2001   cysto; suprapubic tube placement   SHOULDER ARTHROSCOPY W/ ROTATOR CUFF REPAIR Left 2004   TOTAL ABDOMINAL HYSTERECTOMY  08/27/2001   TOTAL KNEE ARTHROPLASTY  02/06/2012   Procedure: TOTAL KNEE ARTHROPLASTY;  Surgeon: Ninetta Lights, MD;  Location: [facility];  Service: Orthopedics;  Laterality: Right;  total knee right side   TOTAL KNEE ARTHROPLASTY  06/04/2012   Procedure: TOTAL KNEE ARTHROPLASTY;  Surgeon: Ninetta Lights, MD;  Location: [facility];  Service: Orthopedics;  Laterality: Left;   ULNAR COLLATERAL LIGAMENT REPAIR Left 11/08/2017   Procedure: REPAIR/RECONSTRUCTION LEFT THUMB ULNAR COLLATERAL LIGAMENT;  Surgeon: Daryll Brod, MD;  Location: [facility];  Service: Orthopedics;  Laterality: Left;  VAGINAL HYSTERECTOMY  1980's    Social History:  reports that she has never smoked. She has never used smokeless tobacco. She reports that she does not drink alcohol and does not use drugs.  Allergies: Allergies  Allergen Reactions   Morphine And Related Shortness Of Breath and Itching    Family History:  Family History  Problem Relation Age of Onset   Heart disease Father    COPD Mother    Heart disease Mother    Heart attack Brother    Colon cancer Neg  Hx      Current Outpatient Medications:    albuterol (PROVENTIL HFA) 108 (90 Base) MCG/ACT inhaler, Inhale 2 puffs into the lungs every 6 (six) hours as needed for wheezing or shortness of breath., Disp: 18 g, Rfl: 3   atorvastatin (LIPITOR) 20 MG tablet, Take 1 tablet (20 mg total) by mouth daily., Disp: 90 tablet, Rfl: 0   blood glucose meter kit and supplies KIT, Dispense based on patient and insurance preference. Use up to four times daily as directed., Disp: 1 each, Rfl: 0   budesonide-formoterol (SYMBICORT) 160-4.5 MCG/ACT inhaler, Inhale 2 puffs then rinse mouth, twice daily, Disp: 1 each, Rfl: 6   clonazePAM (KLONOPIN) 0.5 MG tablet, 1 for sleep if needed, Disp: 30 tablet, Rfl: 5   empagliflozin (JARDIANCE) 25 MG TABS tablet, Take 1 tablet by mouth once daily, Disp: 90 tablet, Rfl: 0   fluticasone furoate-vilanterol (BREO ELLIPTA) 100-25 MCG/INH AEPB, Inhale 1 puff by mouth once daily, Disp: 60 each, Rfl: 2   lisinopril (ZESTRIL) 10 MG tablet, Take 1 tablet by mouth once daily, Disp: 90 tablet, Rfl: 1   loperamide (IMODIUM) 2 MG capsule, Take 4 mg by mouth as needed for diarrhea or loose stools., Disp: , Rfl:    LORazepam (ATIVAN) 0.5 MG tablet, TAKE 1 TABLET BY MOUTH EVERY 8 HOURS AS NEEDED FOR ANXIETY, Disp: 60 tablet, Rfl: 0   meloxicam (MOBIC) 15 MG tablet, Take 1 tablet by mouth once daily, Disp: 90 tablet, Rfl: 0   metFORMIN (GLUCOPHAGE-XR) 500 MG 24 hr tablet, TAKE 1 TABLET BY MOUTH IN THE MORNING AND 1 TABLET AT BEDTIME, Disp: 180 tablet, Rfl: 1   OVER THE COUNTER MEDICATION, Take 1 capsule by mouth daily. Vitamin A / Zinc, Disp: , Rfl:    oxybutynin (DITROPAN XL) 15 MG 24 hr tablet, Take 1 tablet by mouth once daily, Disp: 90 tablet, Rfl: 1   pantoprazole (PROTONIX) 40 MG tablet, Take 1 tablet (40 mg total) by mouth daily., Disp: 90 tablet, Rfl: 1   rOPINIRole (REQUIP) 0.25 MG tablet, TAKE 1 TO 2 TABLETS BY MOUTH BEFORE BEDTIME FOR  RESTLESS  LEG (Patient taking differently: Take  0.25-0.5 mg by mouth at bedtime as needed (RLS). TAKE 1 TO 2 TABLETS BY MOUTH BEFORE BEDTIME FOR  RESTLESS  LEG), Disp: 30 tablet, Rfl: 12   sertraline (ZOLOFT) 100 MG tablet, Take 1 tablet by mouth once daily, Disp: 90 tablet, Rfl: 1   venlafaxine (EFFEXOR) 75 MG tablet, Take 1 tablet by mouth once daily, Disp: 90 tablet, Rfl: 10   vitamin B-12 (CYANOCOBALAMIN) 1000 MCG tablet, Take 1,000 mcg by mouth daily., Disp: , Rfl:   Review of Systems:  Constitutional: Denies fever, chills, diaphoresis, appetite change and fatigue.  HEENT: Denies photophobia, eye pain, redness, hearing loss, ear pain, congestion, sore throat, rhinorrhea, sneezing, mouth sores, trouble swallowing, neck pain, neck stiffness and tinnitus.   Respiratory: Denies SOB, DOE, cough, chest tightness,  and wheezing.   Cardiovascular: Denies chest pain, palpitations and leg swelling.  Gastrointestinal: Denies nausea, vomiting,  constipation and abdominal distention.  Genitourinary: Denies dysuria, urgency, frequency, hematuria, flank pain and difficulty urinating.  Endocrine: Denies: hot or cold intolerance, sweats, changes in hair or nails, polyuria, polydipsia. Musculoskeletal: Denies myalgias, back pain, joint swelling, arthralgias and gait problem.  Skin: Denies pallor, rash and wound.  Neurological: Denies dizziness, seizures, syncope, weakness, light-headedness, numbness and headaches.  Hematological: Denies adenopathy. Easy bruising, personal or family bleeding history  Psychiatric/Behavioral: Denies suicidal ideation, mood changes, confusion, nervousness, sleep disturbance and agitation    Physical Exam: Vitals:   07/04/21 0835  BP: 108/60  Pulse: 73  Temp: 98.2 F (36.8 C)  TempSrc: Oral  SpO2: 95%  Weight: 203 lb 4.8 oz (92.2 kg)  Height: 5\' 3"  (1.6 m)    Body mass index is 36.01 kg/m.   Constitutional: NAD, calm, comfortable Eyes: PERRL, lids and conjunctivae normal, wears corrective lenses ENMT: Mucous  membranes are moist. Posterior pharynx clear of any exudate or lesions. Normal dentition. Tympanic membrane is pearly white, no erythema or bulging. Neck: normal, supple, no masses, no thyromegaly Respiratory: clear to auscultation bilaterally, no wheezing, no crackles. Normal respiratory effort. No accessory muscle use.  Cardiovascular: Regular rate and rhythm, no murmurs / rubs / gallops. No extremity edema. 2+ pedal pulses. No carotid bruits.  Abdomen: no tenderness, no masses palpated. No hepatosplenomegaly. Bowel sounds positive.  Musculoskeletal: no clubbing / cyanosis. No joint deformity upper and lower extremities. Good ROM, no contractures. Normal muscle tone.  Skin: no rashes, lesions, ulcers. No induration Neurologic: CN 2-12 grossly intact. Sensation intact, DTR normal. Strength 5/5 in all 4.  Psychiatric: Normal judgment and insight. Alert and oriented x 3. Normal mood.    Subsequent Medicare wellness visit   1. Risk factors, based on past  M,S,F -cardiovascular disease risk factors include age, history of hypertension, history of hyperlipidemia and type 2 diabetes, obesity   2.  Physical activities: She walks on a daily basis   3.  Depression/mood: Stable, not depressed   4.  Hearing: No perceived hearing issues   5.  ADL's: Independent in all ADLs   6.  Fall risk: Low fall risk   7.  Home safety: No problems identified   8.  Height weight, and visual acuity: height and weight as above, vision:  Vision Screening   Right eye Left eye Both eyes  Without correction     With correction 20/25 20/32 20/32      9.  Counseling: Advise she update her vaccination status   10. Lab orders based on risk factors: Laboratory update will be reviewed   11. Referral : To gastroenterology acutely   12. Care plan: Follow-up with me in 3 to 4 months   13. Cognitive assessment: No cognitive impairment   14. Screening: Patient provided with a written and personalized 5-10 year  screening schedule in the AVS. yes   15. Provider List Update: PCP, gynecology, ophthalmology  16. Advance Directives: Full code   17. Opioids: Patient is not on any opioid prescriptions and has no risk factors for a substance use disorder.   Flowsheet Row Office Visit from 04/07/2021 in [facility] at [facility]  PHQ-9 Total Score 4       Fall Risk 08/20/2018 03/24/2020 04/28/2020 08/24/2020  Falls in the past year? 0 - 0 -  Was there an injury with Fall? 0 - 0 -  Fall Risk Category Calculator  0 - 0 -  Fall Risk Category Low - Low -  Patient Fall Risk Level - Low fall risk - Low fall risk     Impression and Plan:  Encounter for preventive health examination -Recommend routine eye and dental care. -Immunizations: Flu and PCV 13 today, she has declined COVID booster, otherwise immunizations are up-to-date. -Healthy lifestyle discussed in detail. -Labs to be updated today. -Colon cancer screening: Up-to-date, colonoscopy in 2015, 10-year callback -Breast cancer screening: Up-to-date, negative mammogram in August 2022 -Cervical cancer screening: Up-to-date, Pap smear in July 2022 with GYN -Lung cancer screening: Not applicable -Prostate cancer screening: Not applicable -DEXA: Not applicable   Need for immunization against influenza  - Plan: Flu Vaccine QUAD 6+ mos PF IM (Fluarix Quad PF)  Need for pneumococcal vaccination  - Plan: Pneumococcal conjugate vaccine 13-valent  Diabetes mellitus with coincident hypertension ([place])  - Plan: CBC with Differential/Platelet, Comprehensive metabolic panel, Hemoglobin A1c, Lipid panel, TSH -Check A1c today.  HYPERTENSION, BENIGN ESSENTIAL -Blood pressures currently well controlled.  Vitamin D deficiency  - Plan: VITAMIN D 25 Hydroxy (Vit-D Deficiency, Fractures)  Morbid obesity (H[facility]) -Discussed healthy lifestyle, including increased physical activity and better food choices to promote weight loss. -She has been  congratulated on weight loss success thus far.  Pure hypercholesterolemia -Check lipids today, in August 2021 total cholesterol was 173, triglycerides 96 and LDL 94. -Goal LDL is less than 70, she is currently on atorvastatin 20 mg daily, may need to increase statin dose pending results.  Gastroesophageal reflux disease, unspecified whether esophagitis present  Black tarry stools - Plan: pantoprazole (PROTONIX) 40 MG tablet, Ambulatory referral to Gastroenterology -Concerned about possibility of peptic ulcer disease, I will refer urgently to GI, check CBC today and start on PPI therapy.  Vitamin B12 deficiency  - Plan: Vitamin B12    Patient Instructions  -Nice seeing you today!!  -Lab work today; will notify you once results are available.  -Flu and Pneumonia vaccines.  -Consider bivalent COVID booster.  -Start Protonix 40 mg daily. Will get you in to see GI.      Lelon Frohlich, MD [facility]

## 2021-07-04 NOTE — Patient Instructions (Signed)
-  Nice seeing you today!!  -Lab work today; will notify you once results are available.  -Flu and Pneumonia vaccines.  -Consider bivalent COVID booster.  -Start Protonix 40 mg daily. Will get you in to see GI.

## 2021-07-07 ENCOUNTER — Other Ambulatory Visit: Payer: Self-pay | Admitting: Internal Medicine

## 2021-07-07 DIAGNOSIS — I10 Essential (primary) hypertension: Secondary | ICD-10-CM

## 2021-07-21 ENCOUNTER — Encounter: Payer: Self-pay | Admitting: Physician Assistant

## 2021-07-22 ENCOUNTER — Other Ambulatory Visit: Payer: Self-pay | Admitting: Internal Medicine

## 2021-07-22 DIAGNOSIS — E119 Type 2 diabetes mellitus without complications: Secondary | ICD-10-CM

## 2021-07-22 DIAGNOSIS — F339 Major depressive disorder, recurrent, unspecified: Secondary | ICD-10-CM

## 2021-07-30 ENCOUNTER — Other Ambulatory Visit: Payer: Self-pay | Admitting: Internal Medicine

## 2021-07-30 DIAGNOSIS — I10 Essential (primary) hypertension: Secondary | ICD-10-CM

## 2021-07-30 DIAGNOSIS — E119 Type 2 diabetes mellitus without complications: Secondary | ICD-10-CM

## 2021-08-09 ENCOUNTER — Encounter: Payer: Self-pay | Admitting: Physician Assistant

## 2021-08-09 ENCOUNTER — Ambulatory Visit (INDEPENDENT_AMBULATORY_CARE_PROVIDER_SITE_OTHER): Payer: Managed Care, Other (non HMO) | Admitting: Physician Assistant

## 2021-08-09 ENCOUNTER — Other Ambulatory Visit: Payer: Managed Care, Other (non HMO)

## 2021-08-09 VITALS — BP 124/70 | HR 85 | Ht 63.0 in | Wt 200.5 lb

## 2021-08-09 DIAGNOSIS — R1013 Epigastric pain: Secondary | ICD-10-CM | POA: Diagnosis not present

## 2021-08-09 DIAGNOSIS — R197 Diarrhea, unspecified: Secondary | ICD-10-CM | POA: Diagnosis not present

## 2021-08-09 DIAGNOSIS — K219 Gastro-esophageal reflux disease without esophagitis: Secondary | ICD-10-CM

## 2021-08-09 MED ORDER — PANTOPRAZOLE SODIUM 40 MG PO TBEC: 40.0000 mg | DELAYED_RELEASE_TABLET | Freq: Two times a day (BID) | ORAL | 1 refills | Status: DC

## 2021-08-09 NOTE — Progress Notes (Signed)
Chief Complaint: Change in bowel habit  HPI:    Kayla [name] is a 60 year old female with a past medical history as listed below including factor II deficiency and OSA on CPAP as well as PTSD and type 2 diabetes, known to Dr. Hilarie Fredrickson, who was referred to me by Isaac Bliss, [place]* for a complaint of change in bowel habits.    09/14/2013 colonoscopy with a sessile polyp measuring 4-5 mm in size in the sigmoid colon and otherwise normal.  Pathology showed benign colonic mucosa and refuse recommended in 10 years.    07/04/2021 patient saw PCP and discussed that about 2 months prior she had diarrhea for about 2 weeks and since then it had significant epigastric pain and acid reflux as well as some black, tarry stools for about 3 weeks which had improved but she continued with significant epigastric pain and reflux.  At that time prescribed Pantoprazole 40 mg daily.  She was referred to our clinic and a CBC was checked.  CBC and CMP that day or normal.    Today, the patient tells me that she had a change in bowel habits towards mushy or watery stool over the past 3 months which has not stopped.  She has a bowel movement at 1- times a day depending on what she eats, but prior to all this was having a solid stool every other day.  Along with this has an occasional "achy/stabbing" pain which is just superior to her umbilicus rated as a 8/45 at its worst.  She tells me she is not sure what brings it on, she does take Tylenol when it starts but is not sure that this really helps anything.  She has been on Pantoprazole 40 mg daily over the past month which she thinks sometimes helps with this pain and has definitely resolved her reflux.    Tells me she has been on Metformin for 20 years.    Denies fever, chills, sick contacts, blood in her stool, nausea or vomiting.  Past Medical History:  Diagnosis Date   Asthma    Carpal tunnel syndrome of right wrist 11/6466   Complication of anesthesia    hard to wake  up after surg. 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 factor II" (06/05/2012); states has had no problems   Headache(784.0)    due to allergies   Heart murmur    states no problems   Hyperlipidemia    Mitral valve prolapse    OSA on CPAP    Osteoarthritis    knees   Overactive bladder    PTSD (post-traumatic stress disorder)    Type II diabetes mellitus ([place])    NIDDM    Past Surgical History:  Procedure Laterality Date   ARTHRODESIS METATARSALPHALANGEAL JOINT (MTPJ) Right 08/24/2020   Procedure: ARTHRODESIS OF THE METATARSOPHALGEAL JOINT RIGHT FOOT;  Surgeon: Caprice Beaver, DPM;  Location: [facility];  Service: Podiatry;  Laterality: Right;   BREAST BIOPSY Left 04/25/2001   CARPAL TUNNEL RELEASE Right 10/24/2012   Procedure: CARPAL TUNNEL RELEASE;  Surgeon: Ninetta Lights, MD;  Location: [facility];  Service: Orthopedics;  Laterality: Right;  RIGHT CARPAL TUNNEL RELEASE   KNEE ARTHROSCOPY Right 04/01/2008   KNEE ARTHROSCOPY W/ MENISCECTOMY Left 08/16/2011   partial medial   LASIK     bilateral  PUBOVAGINAL SLING  08/27/2001   cysto; suprapubic tube placement   SHOULDER ARTHROSCOPY W/ ROTATOR CUFF REPAIR Left 2004   TOTAL ABDOMINAL HYSTERECTOMY  08/27/2001   TOTAL KNEE ARTHROPLASTY  02/06/2012   Procedure: TOTAL KNEE ARTHROPLASTY;  Surgeon: Ninetta Lights, MD;  Location: [facility];  Service: Orthopedics;  Laterality: Right;  total knee right side   TOTAL KNEE ARTHROPLASTY  06/04/2012   Procedure: TOTAL KNEE ARTHROPLASTY;  Surgeon: Ninetta Lights, MD;  Location: [facility];  Service: Orthopedics;  Laterality: Left;   ULNAR COLLATERAL LIGAMENT REPAIR Left 11/08/2017   Procedure: REPAIR/RECONSTRUCTION LEFT THUMB ULNAR COLLATERAL LIGAMENT;  Surgeon: Daryll Brod, MD;  Location: [place];  Service: Orthopedics;  Laterality: Left;    VAGINAL HYSTERECTOMY  1980's    Current Outpatient Medications  Medication Sig Dispense Refill   albuterol (PROVENTIL HFA) 108 (90 Base) MCG/ACT inhaler Inhale 2 puffs into the lungs every 6 (six) hours as needed for wheezing or shortness of breath. 18 g 3   atorvastatin (LIPITOR) 20 MG tablet Take 1 tablet (20 mg total) by mouth daily. 90 tablet 0   blood glucose meter kit and supplies KIT Dispense based on patient and insurance preference. Use up to four times daily as directed. 1 each 0   budesonide-formoterol (SYMBICORT) 160-4.5 MCG/ACT inhaler Inhale 2 puffs then rinse mouth, twice daily 1 each 6   clonazePAM (KLONOPIN) 0.5 MG tablet 1 for sleep if needed 30 tablet 5   JARDIANCE 25 MG TABS tablet Take 1 tablet by mouth once daily 90 tablet 0   lisinopril (ZESTRIL) 10 MG tablet Take 1 tablet by mouth once daily 90 tablet 1   loperamide (IMODIUM) 2 MG capsule Take 4 mg by mouth as needed for diarrhea or loose stools.     LORazepam (ATIVAN) 0.5 MG tablet TAKE 1 TABLET BY MOUTH EVERY 8 HOURS AS NEEDED FOR ANXIETY 60 tablet 0   meloxicam (MOBIC) 15 MG tablet Take 1 tablet by mouth once daily 90 tablet 0   metFORMIN (GLUCOPHAGE-XR) 500 MG 24 hr tablet TAKE 1 TABLET BY MOUTH IN THE MORNING AND 1 AT BEDTIME 180 tablet 1   OVER THE COUNTER MEDICATION Take 1 capsule by mouth daily. Vitamin A / Zinc     oxybutynin (DITROPAN XL) 15 MG 24 hr tablet Take 1 tablet by mouth once daily 90 tablet 1   pantoprazole (PROTONIX) 40 MG tablet Take 1 tablet (40 mg total) by mouth daily. 90 tablet 1   rOPINIRole (REQUIP) 0.25 MG tablet TAKE 1 TO 2 TABLETS BY MOUTH BEFORE BEDTIME FOR  RESTLESS  LEG (Patient taking differently: Take 0.25-0.5 mg by mouth at bedtime as needed (RLS). TAKE 1 TO 2 TABLETS BY MOUTH BEFORE BEDTIME FOR  RESTLESS  LEG) 30 tablet 12   sertraline (ZOLOFT) 100 MG tablet Take 1 tablet by mouth once daily 90 tablet 1   venlafaxine (EFFEXOR) 75 MG tablet Take 1 tablet by mouth once daily 90 tablet  10   vitamin B-12 (CYANOCOBALAMIN) 1000 MCG tablet Take 1,000 mcg by mouth daily.     Vitamin D, Ergocalciferol, (DRISDOL) 1.25 MG (50000 UNIT) CAPS capsule Take 1 capsule (50,000 Units total) by mouth every 7 (seven) days for 12 doses. 12 capsule 0   No current facility-administered medications for this visit.    Allergies as of 08/09/2021 - Review Complete 08/09/2021  Allergen Reaction Noted   Morphine and related Shortness Of Breath and Itching 02/08/2012    Family History  Problem Relation Age of Onset   Heart disease Father    COPD Mother    Heart disease Mother    Heart attack Brother    Colon cancer Neg Hx     Social History   Socioeconomic History   Marital status: Married    Spouse name: Not on file   Number of children: 2   Years of education: Not on file   Highest education level: Not on file  Occupational History   Occupation: Curator  Tobacco Use   Smoking status: Never   Smokeless tobacco: Never  Vaping Use   Vaping Use: Never used  Substance and Sexual Activity   Alcohol use: No   Drug use: No   Sexual activity: Yes  Other Topics Concern   Not on file  Social History Narrative   Not on file   Social Determinants of Health   Financial Resource Strain: Not on file  Food Insecurity: Not on file  Transportation Needs: Not on file  Physical Activity: Not on file  Stress: Not on file  Social Connections: Not on file  Intimate Partner Violence: Not on file    Review of Systems:    Constitutional: No weight loss, fever or chills Skin: No rash Cardiovascular: No chest pain Respiratory: No SOB Gastrointestinal: See HPI and otherwise negative Genitourinary: No dysuria  Neurological: No headache, dizziness or syncope Musculoskeletal: No new muscle or joint pain Hematologic: No bleeding Psychiatric: No history of depression or anxiety   Physical Exam:  Vital signs: BP 124/70   Pulse 85   Ht 5' 3" (1.6 m)   Wt 200 lb 8 oz (90.9 kg)    BMI 35.52 kg/m    Constitutional:   Pleasant Caucasian female appears to be in NAD, Well developed, Well nourished, alert and cooperative Head:  Normocephalic and atraumatic. Eyes:   PEERL, EOMI. No icterus. Conjunctiva pink. Ears:  Normal auditory acuity. Neck:  Supple Throat: Oral cavity and pharynx without inflammation, swelling or lesion.  Respiratory: Respirations even and unlabored. Lungs clear to auscultation bilaterally.   No wheezes, crackles, or rhonchi.  Cardiovascular: Normal S1, S2. No MRG. Regular rate and rhythm. No peripheral edema, cyanosis or pallor.  Gastrointestinal:  Soft, nondistended, nontender. No rebound or guarding. Normal bowel sounds. No appreciable masses or hepatomegaly. Rectal:  Not performed.  Msk:  Symmetrical without gross deformities. Without edema, no deformity or joint abnormality.  Neurologic:  Alert and  oriented x4;  grossly normal neurologically.  Skin:   Dry and intact without significant lesions or rashes. Psychiatric: Demonstrates good judgement and reason without abnormal affect or behaviors.  RELEVANT LABS AND IMAGING: CBC    Component Value Date/Time   WBC 7.3 07/04/2021 0905   RBC 4.97 07/04/2021 0905   HGB 14.3 07/04/2021 0905   HCT 42.6 07/04/2021 0905   PLT 246.0 07/04/2021 0905   MCV 85.8 07/04/2021 0905   MCH 29.0 04/28/2020 1513   MCHC 33.5 07/04/2021 0905   RDW 14.1 07/04/2021 0905   LYMPHSABS 2.9 07/04/2021 0905   MONOABS 0.6 07/04/2021 0905   EOSABS 0.2 07/04/2021 0905   BASOSABS 0.0 07/04/2021 0905    CMP     Component Value Date/Time   NA 139 07/04/2021 0905   K 4.1 07/04/2021 0905   CL 102 07/04/2021 0905   CO2 29 07/04/2021 0905   GLUCOSE 91 07/04/2021 0905   BUN 17 07/04/2021 0905   CREATININE 1.01 07/04/2021 0905   CREATININE 0.99 04/28/2020 1513  CALCIUM 9.8 07/04/2021 0905   PROT 6.8 07/04/2021 0905   ALBUMIN 4.4 07/04/2021 0905   AST 18 07/04/2021 0905   ALT 18 07/04/2021 0905   ALKPHOS 77  07/04/2021 0905   BILITOT 0.7 07/04/2021 0905   GFRNONAA 75 (L) 10/20/2012 1610   GFRAA 87 (L) 10/20/2012 1610    Assessment: 1.  Diarrhea: Changed to watery or mushy stool 1-3 times a day over the past 3 months; consider infectious cause versus IBS versus metformin 2.  Abdominal pain: Superior to her umbilicus, likely related to below but consider IBS as well 3.  GERD: Increase in symptoms over the past 3 months, better now on Pantoprazole 40 mg daily for a month; likely gastritis 4.  Screening for colorectal cancer: Colonoscopy in 2015 with benign polyp, repeat recommended 10 years  Plan: 1.  Discussed with patient that it could be her Metformin which is now giving her mushy stool.  We will test for infectious cause first. 2.  Ordered stool studies including GI pathogen panel and fecal lactoferrin 3.  Encouraged the patient to start a probiotic such as Align once daily for the next 2 months 4.  Increased Pantoprazole to 40 mg twice daily, 30-60 minutes before breakfast and dinner.  Prescribed #60 with 3 refills. 5.  If patient does not feel any better follow-up in 2 months then could consider EGD and colonoscopy.  Ellouise Newer, PA-C [facility] 08/09/2021, 9:20 AM  Cc: Isaac Bliss, [place]*

## 2021-08-09 NOTE — Patient Instructions (Addendum)
If you are age 62 or younger, your body mass index should be between 19-25. Your Body mass index is 35.52 kg/m. If this is out of the aformentioned range listed, please consider follow up with your Primary Care Provider.  ________________________________________________________  The [place] [facility] providers would like to encourage you to use [facility] to communicate with providers for non-urgent requests or questions.  Due to long hold times on the telephone, sending your provider a message by [facility] may be a faster and more efficient way to get a response.  Please allow 48 business hours for a response.  Please remember that this is for non-urgent requests.  _______________________________________________________  Your provider has requested that you go to the basement level for lab work before leaving today. Press "B" on the elevator. The lab is located at the first door on the left as you exit the elevator.  Start using Align daily for 2 months.  INCREASE Pantoprazole to 40 mg twice daily  Contact the office in the next few weeks to schedule a 2 month follow up (End of January, beginning of February).  Thank you for entrusting me with your care and choosing [facility].  Ellouise Newer, PA-C

## 2021-08-10 NOTE — Progress Notes (Signed)
Addendum: Reviewed and agree with assessment and management plan. Jeb Schloemer M, MD  

## 2021-08-11 ENCOUNTER — Other Ambulatory Visit: Payer: Managed Care, Other (non HMO)

## 2021-08-11 DIAGNOSIS — R197 Diarrhea, unspecified: Secondary | ICD-10-CM

## 2021-08-11 DIAGNOSIS — K219 Gastro-esophageal reflux disease without esophagitis: Secondary | ICD-10-CM

## 2021-08-11 DIAGNOSIS — R1013 Epigastric pain: Secondary | ICD-10-CM

## 2021-08-15 LAB — GI PROFILE, STOOL, PCR

## 2021-08-15 LAB — FECAL LACTOFERRIN, QUANT
Fecal Lactoferrin: NEGATIVE
MICRO NUMBER:: 12707615
SPECIMEN QUALITY:: ADEQUATE

## 2021-08-24 ENCOUNTER — Other Ambulatory Visit: Payer: Self-pay

## 2021-08-24 ENCOUNTER — Ambulatory Visit
Admission: RE | Admit: 2021-08-24 | Discharge: 2021-08-24 | Disposition: A | Discharge status: Home / Self Care | Payer: Managed Care, Other (non HMO) | Source: Ambulatory Visit | Attending: Urgent Care | Admitting: Urgent Care

## 2021-08-24 VITALS — BP 121/71 | HR 69 | Temp 97.3°F | Resp 18

## 2021-08-24 DIAGNOSIS — R300 Dysuria: Secondary | ICD-10-CM | POA: Diagnosis present

## 2021-08-24 DIAGNOSIS — R350 Frequency of micturition: Secondary | ICD-10-CM | POA: Diagnosis not present

## 2021-08-24 DIAGNOSIS — E119 Type 2 diabetes mellitus without complications: Secondary | ICD-10-CM | POA: Insufficient documentation

## 2021-08-24 DIAGNOSIS — N3001 Acute cystitis with hematuria: Secondary | ICD-10-CM | POA: Insufficient documentation

## 2021-08-24 LAB — POCT URINALYSIS DIP (MANUAL ENTRY)
Bilirubin, UA: NEGATIVE
Glucose, UA: >=1000 mg/dL — AB
Ketones, POC UA: NEGATIVE mg/dL
Leukocytes, UA: NEGATIVE
Nitrite, UA: POSITIVE — AB
Protein Ur, POC: NEGATIVE mg/dL
Spec Grav, UA: 1.01 (ref 1.010–1.025)
Urobilinogen, UA: 0.2 E.U./dL
pH, UA: 5 (ref 5.0–8.0)

## 2021-08-24 MED ORDER — CEPHALEXIN 500 MG PO CAPS: 500.0000 mg | ORAL_CAPSULE | Freq: Two times a day (BID) | ORAL | 0 refills | Status: DC

## 2021-08-24 NOTE — Discharge Instructions (Signed)

## 2021-08-24 NOTE — ED Triage Notes (Signed)
Pain on urination x 2 weeks.

## 2021-08-24 NOTE — ED Provider Notes (Signed)
[place]   MRN: [id number] DOB: [birth year]  Subjective:   Kayla [name] is a 60 y.o. female presenting for 2-week history of persistent dysuria, urinary frequency, urinary urgency.  Patient is on Jardiance for her type 2 diabetes.  She is treated without insulin.  No fever, nausea, vomiting, vaginal discharge, flank pain.  No current facility-administered medications for this encounter.  Current Outpatient Medications:    albuterol (PROVENTIL HFA) 108 (90 Base) MCG/ACT inhaler, Inhale 2 puffs into the lungs every 6 (six) hours as needed for wheezing or shortness of breath., Disp: 18 g, Rfl: 3   atorvastatin (LIPITOR) 20 MG tablet, Take 1 tablet (20 mg total) by mouth daily., Disp: 90 tablet, Rfl: 0   blood glucose meter kit and supplies KIT, Dispense based on patient and insurance preference. Use up to four times daily as directed., Disp: 1 each, Rfl: 0   budesonide-formoterol (SYMBICORT) 160-4.5 MCG/ACT inhaler, Inhale 2 puffs then rinse mouth, twice daily, Disp: 1 each, Rfl: 6   clonazePAM (KLONOPIN) 0.5 MG tablet, 1 for sleep if needed, Disp: 30 tablet, Rfl: 5   JARDIANCE 25 MG TABS tablet, Take 1 tablet by mouth once daily, Disp: 90 tablet, Rfl: 0   lisinopril (ZESTRIL) 10 MG tablet, Take 1 tablet by mouth once daily, Disp: 90 tablet, Rfl: 1   loperamide (IMODIUM) 2 MG capsule, Take 4 mg by mouth as needed for diarrhea or loose stools., Disp: , Rfl:    LORazepam (ATIVAN) 0.5 MG tablet, TAKE 1 TABLET BY MOUTH EVERY 8 HOURS AS NEEDED FOR ANXIETY, Disp: 60 tablet, Rfl: 0   meloxicam (MOBIC) 15 MG tablet, Take 1 tablet by mouth once daily, Disp: 90 tablet, Rfl: 0   metFORMIN (GLUCOPHAGE-XR) 500 MG 24 hr tablet, TAKE 1 TABLET BY MOUTH IN THE MORNING AND 1 AT BEDTIME, Disp: 180 tablet, Rfl: 1   OVER THE COUNTER MEDICATION, Take 1 capsule by mouth daily. Vitamin A / Zinc, Disp: , Rfl:    oxybutynin (DITROPAN XL) 15 MG 24 hr tablet, Take 1 tablet by mouth once daily,  Disp: 90 tablet, Rfl: 1   pantoprazole (PROTONIX) 40 MG tablet, Take 1 tablet (40 mg total) by mouth 2 (two) times daily before a meal., Disp: 180 tablet, Rfl: 1   rOPINIRole (REQUIP) 0.25 MG tablet, TAKE 1 TO 2 TABLETS BY MOUTH BEFORE BEDTIME FOR  RESTLESS  LEG (Patient taking differently: Take 0.25-0.5 mg by mouth at bedtime as needed (RLS). TAKE 1 TO 2 TABLETS BY MOUTH BEFORE BEDTIME FOR  RESTLESS  LEG), Disp: 30 tablet, Rfl: 12   sertraline (ZOLOFT) 100 MG tablet, Take 1 tablet by mouth once daily, Disp: 90 tablet, Rfl: 1   venlafaxine (EFFEXOR) 75 MG tablet, Take 1 tablet by mouth once daily, Disp: 90 tablet, Rfl: 10   vitamin B-12 (CYANOCOBALAMIN) 1000 MCG tablet, Take 1,000 mcg by mouth daily., Disp: , Rfl:    Vitamin D, Ergocalciferol, (DRISDOL) 1.25 MG (50000 UNIT) CAPS capsule, Take 1 capsule (50,000 Units total) by mouth every 7 (seven) days for 12 doses., Disp: 12 capsule, Rfl: 0   Allergies  Allergen Reactions   Morphine And Related Shortness Of Breath and Itching    Past Medical History:  Diagnosis Date   Asthma    Carpal tunnel syndrome of right wrist 0/1655   Complication of anesthesia    hard to wake up after surg. 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[place])    "prothrombin gene factor II" (06/05/2012); states has had no problems   Headache(784.0)    due to allergies   Heart murmur    states no problems   Hyperlipidemia    Mitral valve prolapse    OSA on CPAP    Osteoarthritis    knees   Overactive bladder    PTSD (post-traumatic stress disorder)    Type II diabetes mellitus ([place])    NIDDM     Past Surgical History:  Procedure Laterality Date   ARTHRODESIS METATARSALPHALANGEAL JOINT (MTPJ) Right 08/24/2020   Procedure: ARTHRODESIS OF THE METATARSOPHALGEAL JOINT RIGHT FOOT;  Surgeon: Caprice Beaver, DPM;  Location: [facility];  Service: Podiatry;  Laterality:  Right;   BREAST BIOPSY Left 04/25/2001   CARPAL TUNNEL RELEASE Right 10/24/2012   Procedure: CARPAL TUNNEL RELEASE;  Surgeon: Ninetta Lights, MD;  Location: [place];  Service: Orthopedics;  Laterality: Right;  RIGHT CARPAL TUNNEL RELEASE   KNEE ARTHROSCOPY Right 04/01/2008   KNEE ARTHROSCOPY W/ MENISCECTOMY Left 08/16/2011   partial medial   LASIK     bilateral   PUBOVAGINAL SLING  08/27/2001   cysto; suprapubic tube placement   SHOULDER ARTHROSCOPY W/ ROTATOR CUFF REPAIR Left 2004   TOTAL ABDOMINAL HYSTERECTOMY  08/27/2001   TOTAL KNEE ARTHROPLASTY  02/06/2012   Procedure: TOTAL KNEE ARTHROPLASTY;  Surgeon: Ninetta Lights, MD;  Location: [place];  Service: Orthopedics;  Laterality: Right;  total knee right side   TOTAL KNEE ARTHROPLASTY  06/04/2012   Procedure: TOTAL KNEE ARTHROPLASTY;  Surgeon: Ninetta Lights, MD;  Location: [place];  Service: Orthopedics;  Laterality: Left;   ULNAR COLLATERAL LIGAMENT REPAIR Left 11/08/2017   Procedure: REPAIR/RECONSTRUCTION LEFT THUMB ULNAR COLLATERAL LIGAMENT;  Surgeon: Daryll Brod, MD;  Location: [place];  Service: Orthopedics;  Laterality: Left;   VAGINAL HYSTERECTOMY  1980's    Family History  Problem Relation Age of Onset   Heart disease Father    COPD Mother    Heart disease Mother    Heart attack Brother    Colon cancer Neg Hx     Social History   Tobacco Use   Smoking status: Never   Smokeless tobacco: Never  Vaping Use   Vaping Use: Never used  Substance Use Topics   Alcohol use: No   Drug use: No    ROS   Objective:   Vitals: BP 121/71 (BP Location: Right Arm)    Pulse 69    Temp (!) 97.3 F (36.3 C) (Oral)    Resp 18    SpO2 98%   Physical Exam Constitutional:      General: She is not in acute distress.    Appearance: Normal appearance. She is well-developed. She is not ill-appearing, toxic-appearing or diaphoretic.  HENT:     Head: Normocephalic and atraumatic.     Nose: Nose normal.      Mouth/Throat:     Mouth: Mucous membranes are moist.     Pharynx: Oropharynx is clear.  Eyes:     General: No scleral icterus.       Right eye: No discharge.        Left eye: No discharge.     Extraocular Movements: Extraocular movements intact.     Conjunctiva/sclera: Conjunctivae normal.     Pupils: Pupils are equal, round, and reactive to light.  Cardiovascular:     Rate and  Rhythm: Normal rate.  Pulmonary:     Effort: Pulmonary effort is normal.  Abdominal:     General: Bowel sounds are normal. There is no distension.     Palpations: Abdomen is soft. There is no mass.     Tenderness: There is no abdominal tenderness. There is no right CVA tenderness, left CVA tenderness, guarding or rebound.  Skin:    General: Skin is warm and dry.  Neurological:     General: No focal deficit present.     Mental Status: She is alert and oriented to person, place, and time.  Psychiatric:        Mood and Affect: Mood normal.        Behavior: Behavior normal.        Thought Content: Thought content normal.        Judgment: Judgment normal.    Results for orders placed or performed during the hospital encounter of 08/24/21 (from the past 24 hour(s))  POCT urinalysis dipstick     Status: Abnormal   Collection Time: 08/24/21  1:33 PM  Result Value Ref Range   Color, UA yellow yellow   Clarity, UA clear clear   Glucose, UA >=1,000 (A) negative mg/dL   Bilirubin, UA negative negative   Ketones, POC UA negative negative mg/dL   Spec Grav, UA 1.010 1.010 - 1.025   Blood, UA small (A) negative   pH, UA 5.0 5.0 - 8.0   Protein Ur, POC negative negative mg/dL   Urobilinogen, UA 0.2 0.2 or 1.0 E.U./dL   Nitrite, UA Positive (A) Negative   Leukocytes, UA Negative Negative     Assessment and Plan :   PDMP not reviewed this encounter.  1. Acute cystitis with hematuria   2. Dysuria   3. Urinary frequency   4. Type 2 diabetes mellitus treated without insulin ([place])    Start Keflex to  cover for acute cystitis, urine culture pending.  Recommended aggressive hydration, limiting urinary irritants.  Advised patient to revisit whether or not Vania Rea is a right medicine for her if she continues to have UTIs.  She will revisit this with her PCP if she has recurrent UTIs.  Counseled patient on potential for adverse effects with medications prescribed/recommended today, ER and return-to-clinic precautions discussed, patient verbalized understanding.    Jaynee Eagles, PA-C 08/24/21 1340

## 2021-08-27 LAB — URINE CULTURE: Culture: >=100000 — AB

## 2021-09-04 ENCOUNTER — Encounter: Payer: Self-pay | Admitting: Internal Medicine

## 2021-09-04 NOTE — Assessment & Plan Note (Signed)
Kayla [name] works well but she complains about the cost.  We discussed generic Symbicort as a possible alternative Plan-Symbicort 160

## 2021-09-04 NOTE — Assessment & Plan Note (Signed)
We discussed basic sleep hygiene and medications. Plan-try clonazepam

## 2021-09-04 NOTE — Assessment & Plan Note (Signed)
Benefits from CPAP Plan- continue auto 5-15 

## 2021-09-09 ENCOUNTER — Other Ambulatory Visit: Payer: Self-pay | Admitting: Internal Medicine

## 2021-09-09 DIAGNOSIS — E7800 Pure hypercholesterolemia, unspecified: Secondary | ICD-10-CM

## 2021-09-13 NOTE — Progress Notes (Signed)
Subjective:    Patient ID: Kayla [name], female    DOB: 01/23/1961, 61 y.o.   MRN: [id number]  HPI female never smoker followed for OSA/Insomnia, Asthma, allergic rhinitis complicated by HBP, depression, DM NPSG 01/30/11-AHI 19.9/hour, desaturation to 88%, CPAP titration to 9, body weight 230 pounds FENO 01/04/17- 42 H Office Spirometry 01/04/17-mild restriction of exhaled volume. FVC 2.51/78%, FEV1 2.06/82%, ratio 0.82, FEF25-75% 2.27/95% -------------------------------------------------------------------------------------------   03/14/21- 61 yo female never smoker followed for OSA/Insomnia, Restless Legs, Asthma, Allergic Rhinitis,  complicated by HBP, depression, DM2, -Albuterol hfa, Breo 100, Requip 0.25 CPAP auto 5-15/ [employer]    AirSense 10 Autoset Download-compliance 70%, AHI 2.7. hr     Some short nights Body weight today- Covid vax- -----Wears CPAP-6-8 hrs. Each night.Sob increased x 2 mths., cough-clear, occass. Wheezing, pnd ACT score 15 Breo inhaler works well but expensive.  Increased wheeze during the summer months, with some postnasal drip.  09/13/21-60 yo female never smoker followed for OSA/Insomnia, Restless Legs, Asthma, Allergic Rhinitis,  complicated by HBP, depression, DM2, -Albuterol hfa, Symbicort 160, Requip 0.25, clonazepam 0.5 CPAP auto 5-15/ [employer]    AirSense 10 Autoset Download-compliance 67%, AHI 2.4/ hr Body weight today-198 lbs Covid vax-3 Moderna Flu vax-had -----Patient is doing good no concerns    ACT 25 Wakes with postnasal drip and cough at times.  Still has trouble initiating and maintaining sleep but much better with clonazepam 0.5 mg.  She is willing to try increasing that dosage.  Very comfortable with her CPAP-no concerns.  Download reviewed.   ROS-see HPI            + = positive Constitutional:   No-   weight loss, night sweats, fevers, chills, fatigue, lassitude. HEENT:   No-  headaches, difficulty swallowing, tooth/dental problems, sore  throat,       No-  sneezing, itching, ear ache, +nasal congestion, post nasal drip,  CV:  No-   chest pain, orthopnea, PND, swelling in lower extremities, anasarca,  dizziness, palpitations Resp:  No hemoptysis  Skin: No-   rash or lesions. GI:  No-   heartburn, indigestion, abdominal pain, nausea, vomiting,  GU: . MS:  No-   joint pain or swelling.  . Neuro-     nothing unusual Psych:  No- change in mood or affect. No depression or anxiety.  No memory loss.  OBJ- Physical Exam General- Alert, Oriented, Affect-appropriate, Distress- none acute, + overweight Skin- rash-none, lesions- none, excoriation- none Lymphadenopathy- none Head- atraumatic            Eyes- Gross vision intact, PERRLA, conjunctivae and secretions clear            Ears- Hearing, canals-normal            Nose- Clear, no-Septal dev, mucus, polyps, erosion, perforation             Throat- Mallampati III , mucosa clear , drainage- none, tonsils- atrophic Neck- flexible , trachea midline, no stridor , thyroid nl, carotid no bruit Chest - symmetrical excursion , unlabored           Heart/CV- RRR , no murmur , no gallop  , no rub, nl s1 s2                           - JVD- none , edema- none, stasis changes- none, varices- none           Lung- clear to P&A, wheeze- none,  cough- none , dullness-none, rub- none           Chest wall-  Abd-  Br/ Gen/ Rectal- Not done, not indicated Extrem- cyanosis- none, clubbing, none, atrophy- none, strength- nl Neuro- grossly intact to observation

## 2021-09-14 ENCOUNTER — Other Ambulatory Visit: Payer: Self-pay

## 2021-09-14 ENCOUNTER — Ambulatory Visit (INDEPENDENT_AMBULATORY_CARE_PROVIDER_SITE_OTHER): Payer: Medicare Other | Admitting: Internal Medicine

## 2021-09-14 ENCOUNTER — Encounter: Payer: Self-pay | Admitting: Internal Medicine

## 2021-09-14 DIAGNOSIS — G4733 Obstructive sleep apnea (adult) (pediatric): Secondary | ICD-10-CM | POA: Diagnosis not present

## 2021-09-14 DIAGNOSIS — F5101 Primary insomnia: Secondary | ICD-10-CM | POA: Diagnosis not present

## 2021-09-14 MED ORDER — CLONAZEPAM 0.5 MG PO TABS: ORAL_TABLET | ORAL | 5 refills | Status: DC

## 2021-09-14 NOTE — Assessment & Plan Note (Signed)
We discussed sleep habits and environment. Plan-try increasing clonazepam to 2 of the 0.5 mg tablets.  Okay to fraction.

## 2021-09-14 NOTE — Patient Instructions (Signed)
Try increasing clonazepam to 2 x 0.5 mg if needed   New script sent  We can continue CPAP auto 5-15. Try to use it at least 4 hours/ night, at least 6 nights/ week. More if you can.  Please call if we can help

## 2021-09-14 NOTE — Assessment & Plan Note (Signed)
She benefits, sleeping better, when using CPAP.  I think she can do better with compliance but she describes trying to cope with insomnia. Plan-continue auto 5-15.  Education done.

## 2021-10-03 ENCOUNTER — Encounter: Payer: Self-pay | Admitting: Physician Assistant

## 2021-10-03 ENCOUNTER — Ambulatory Visit: Payer: Medicare Other | Admitting: Physician Assistant

## 2021-10-03 VITALS — BP 118/74 | HR 83 | Ht 63.0 in | Wt 202.0 lb

## 2021-10-03 DIAGNOSIS — R194 Change in bowel habit: Secondary | ICD-10-CM

## 2021-10-03 DIAGNOSIS — R1013 Epigastric pain: Secondary | ICD-10-CM

## 2021-10-03 MED ORDER — NA SULFATE-K SULFATE-MG SULF 17.5-3.13-1.6 GM/177ML PO SOLN: 1.0000 | Freq: Once | ORAL | 0 refills | Status: AC

## 2021-10-03 NOTE — Progress Notes (Signed)
Addendum: Reviewed and agree with assessment and management plan. Henryk Ursin M, MD  

## 2021-10-03 NOTE — Patient Instructions (Signed)
Continue Pantoprazole 40 mg twice daily 30-60 minutes before breakfast and dinner.   Talk to PCP about stopping Metformin.   You have been scheduled for an endoscopy and colonoscopy. Please follow the written instructions given to you at your visit today. Please pick up your prep supplies at the pharmacy within the next 1-3 days. If you use inhalers (even only as needed), please bring them with you on the day of your procedure.  If you are age 63 or older, your body mass index should be between 23-30. Your Body mass index is 35.78 kg/m. If this is out of the aforementioned range listed, please consider follow up with your Primary Care Provider.  If you are age 67 or younger, your body mass index should be between 19-25. Your Body mass index is 35.78 kg/m. If this is out of the aformentioned range listed, please consider follow up with your Primary Care Provider.   ________________________________________________________  The [place] [facility] providers would like to encourage you to use [facility] to communicate with providers for non-urgent requests or questions.  Due to long hold times on the telephone, sending your provider a message by [facility] may be a faster and more efficient way to get a response.  Please allow 48 business hours for a response.  Please remember that this is for non-urgent requests.  _______________________________________________________

## 2021-10-03 NOTE — Progress Notes (Signed)
Chief Complaint: Change in bowel habit and epigastric pain  HPI:    Kayla [name] is a 61 year old female with a past medical history as listed below including factor II deficiency, OSA on CPAP and PTSD as well as type 2 diabetes, known to Dr. Hilarie Fredrickson, who returns to clinic today for follow-up of her change in bowel habits and epigastric pain.  09/14/2013 colonoscopy with a sessile polyp measuring 4-5 mm in size in the sigmoid colon and otherwise normal.  Pathology showed benign colonic mucosa and refuse recommended in 10 years.    07/04/2021 patient saw PCP and discussed that about 2 months prior she had diarrhea for about 2 weeks and since then it had significant epigastric pain and acid reflux as well as some black, tarry stools for about 3 weeks which had improved but she continued with significant epigastric pain and reflux.  At that time prescribed Pantoprazole 40 mg daily.  She was referred to our clinic and a CBC was checked.  CBC and CMP that day or normal.    08/09/2021 patient described a change in bowel habits towards mushy or watery stool for 3 months.  Also occasional epigastric pain.  That time discussed that her metformin could be giving her mushy stool but we ordered a GI pathogen panel and fecal lactoferrin first.  Start the patient on a probiotic.  I also increased her Pantoprazole to 40 twice daily.  Discussed that she does not feel better in 2 months and consider an EGD and colonoscopy.    08/12/2019 2 GI pathogen panel and fecal lactoferrin were negative/normal.    08/24/2021 patient seen in the urgent care for UTI and given Keflex.  At that time had been switched to [facility] for her diabetes.    Today, the patient tells me that her epigastric pain is only slightly better with increasing Pantoprazole to 40 twice daily.  She tells me it is just still hurting at times.  Again this seems to correlate with food.    Also continues with soft stools and a lot of gas and at times explosive  diarrhea.  She continues on Metformin.  She has not yet discussed this with her PCP.    Denies fever, chills, weight loss, blood in her stool or symptoms that awaken her from sleep.  Past Medical History:  Diagnosis Date   Asthma    Carpal tunnel syndrome of right wrist [id number]/4696   Complication of anesthesia    hard to wake up after surg. 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[place])    "prothrombin gene factor II" (06/05/2012); states has had no problems   Headache(784.0)    due to allergies   Heart murmur    states no problems   Hyperlipidemia    Mitral valve prolapse    OSA on CPAP    Osteoarthritis    knees   Overactive bladder    PTSD (post-traumatic stress disorder)    Type II diabetes mellitus ([place])    NIDDM    Past Surgical History:  Procedure Laterality Date   ARTHRODESIS METATARSALPHALANGEAL JOINT (MTPJ) Right 08/24/2020   Procedure: ARTHRODESIS OF THE METATARSOPHALGEAL JOINT RIGHT FOOT;  Surgeon: Caprice Beaver, DPM;  Location: [facility];  Service: Podiatry;  Laterality: Right;   BREAST BIOPSY Left 04/25/2001   CARPAL TUNNEL RELEASE Right 10/24/2012   Procedure: CARPAL TUNNEL RELEASE;  Surgeon: Ninetta Lights, MD;  Location: [place];  Service: Orthopedics;  Laterality: Right;  RIGHT CARPAL TUNNEL RELEASE   KNEE ARTHROSCOPY Right 04/01/2008   KNEE ARTHROSCOPY W/ MENISCECTOMY Left 08/16/2011   partial medial   LASIK     bilateral   PUBOVAGINAL SLING  08/27/2001   cysto; suprapubic tube placement   SHOULDER ARTHROSCOPY W/ ROTATOR CUFF REPAIR Left 2004   TOTAL ABDOMINAL HYSTERECTOMY  08/27/2001   TOTAL KNEE ARTHROPLASTY  02/06/2012   Procedure: TOTAL KNEE ARTHROPLASTY;  Surgeon: Ninetta Lights, MD;  Location: [place];  Service: Orthopedics;  Laterality: Right;  total knee right side   TOTAL KNEE ARTHROPLASTY  06/04/2012   Procedure: TOTAL KNEE  ARTHROPLASTY;  Surgeon: Ninetta Lights, MD;  Location: [place];  Service: Orthopedics;  Laterality: Left;   ULNAR COLLATERAL LIGAMENT REPAIR Left 11/08/2017   Procedure: REPAIR/RECONSTRUCTION LEFT THUMB ULNAR COLLATERAL LIGAMENT;  Surgeon: Daryll Brod, MD;  Location: [place];  Service: Orthopedics;  Laterality: Left;   VAGINAL HYSTERECTOMY  1980's    Current Outpatient Medications  Medication Sig Dispense Refill   albuterol (PROVENTIL HFA) 108 (90 Base) MCG/ACT inhaler Inhale 2 puffs into the lungs every 6 (six) hours as needed for wheezing or shortness of breath. 18 g 3   atorvastatin (LIPITOR) 20 MG tablet Take 1 tablet by mouth once daily 90 tablet 0   blood glucose meter kit and supplies KIT Dispense based on patient and insurance preference. Use up to four times daily as directed. 1 each 0   budesonide-formoterol (SYMBICORT) 160-4.5 MCG/ACT inhaler Inhale 2 puffs then rinse mouth, twice daily 1 each 6   cephALEXin (KEFLEX) 500 MG capsule Take 1 capsule (500 mg total) by mouth 2 (two) times daily. 10 capsule 0   clonazePAM (KLONOPIN) 0.5 MG tablet 1 or 2 for sleep if needed 60 tablet 5   JARDIANCE 25 MG TABS tablet Take 1 tablet by mouth once daily 90 tablet 0   lisinopril (ZESTRIL) 10 MG tablet Take 1 tablet by mouth once daily 90 tablet 1   loperamide (IMODIUM) 2 MG capsule Take 4 mg by mouth as needed for diarrhea or loose stools.     LORazepam (ATIVAN) 0.5 MG tablet TAKE 1 TABLET BY MOUTH EVERY 8 HOURS AS NEEDED FOR ANXIETY 60 tablet 0   meloxicam (MOBIC) 15 MG tablet Take 1 tablet by mouth once daily 90 tablet 0   metFORMIN (GLUCOPHAGE-XR) 500 MG 24 hr tablet TAKE 1 TABLET BY MOUTH IN THE MORNING AND 1 AT BEDTIME 180 tablet 1   OVER THE COUNTER MEDICATION Take 1 capsule by mouth daily. Vitamin A / Zinc     oxybutynin (DITROPAN XL) 15 MG 24 hr tablet Take 1 tablet by mouth once daily 90 tablet 1   pantoprazole (PROTONIX) 40 MG tablet Take 1 tablet (40 mg total) by mouth 2  (two) times daily before a meal. 180 tablet 1   rOPINIRole (REQUIP) 0.25 MG tablet TAKE 1 TO 2 TABLETS BY MOUTH BEFORE BEDTIME FOR  RESTLESS  LEG (Patient taking differently: Take 0.25-0.5 mg by mouth at bedtime as needed (RLS). TAKE 1 TO 2 TABLETS BY MOUTH BEFORE BEDTIME FOR  RESTLESS  LEG) 30 tablet 12   sertraline (ZOLOFT) 100 MG tablet Take 1 tablet by mouth once daily 90 tablet 1   venlafaxine (EFFEXOR) 75 MG tablet Take 1 tablet by mouth once daily 90 tablet 10   vitamin B-12 (CYANOCOBALAMIN) 1000 MCG tablet Take  1,000 mcg by mouth daily.     No current facility-administered medications for this visit.    Allergies as of 10/03/2021 - Review Complete 09/14/2021  Allergen Reaction Noted   Morphine and related Shortness Of Breath and Itching 02/08/2012    Family History  Problem Relation Age of Onset   Heart disease Father    COPD Mother    Heart disease Mother    Heart attack Brother    Colon cancer Neg Hx     Social History   Socioeconomic History   Marital status: Married    Spouse name: Not on file   Number of children: 2   Years of education: Not on file   Highest education level: Not on file  Occupational History   Occupation: Curator  Tobacco Use   Smoking status: Never   Smokeless tobacco: Never  Vaping Use   Vaping Use: Never used  Substance and Sexual Activity   Alcohol use: No   Drug use: No   Sexual activity: Yes  Other Topics Concern   Not on file  Social History Narrative   Not on file   Social Determinants of Health   Financial Resource Strain: Not on file  Food Insecurity: Not on file  Transportation Needs: Not on file  Physical Activity: Not on file  Stress: Not on file  Social Connections: Not on file  Intimate Partner Violence: Not on file    Review of Systems:    Constitutional: No weight loss, fever or chills Cardiovascular: No chest pain Respiratory: No SOB  Gastrointestinal: See HPI and otherwise negative   Physical  Exam:  Vital signs: BP 118/74    Pulse 83    Ht $R'5\' 3"'Ph$  (1.6 m)    Wt 202 lb (91.6 kg)    SpO2 98%    BMI 35.78 kg/m    Constitutional:   Pleasant Caucasian female appears to be in NAD, Well developed, Well nourished, alert and cooperative Respiratory: Respirations even and unlabored. Lungs clear to auscultation bilaterally.   No wheezes, crackles, or rhonchi.  Cardiovascular: Normal S1, S2. No MRG. Regular rate and rhythm. No peripheral edema, cyanosis or pallor.  Gastrointestinal:  Soft, nondistended, mild epigastric ttp. No rebound or guarding. Normal bowel sounds. No appreciable masses or hepatomegaly. Rectal:  Not performed.  Psychiatric:  Demonstrates good judgement and reason without abnormal affect or behaviors.  RELEVANT LABS AND IMAGING: CBC    Component Value Date/Time   WBC 7.3 07/04/2021 0905   RBC 4.97 07/04/2021 0905   HGB 14.3 07/04/2021 0905   HCT 42.6 07/04/2021 0905   PLT 246.0 07/04/2021 0905   MCV 85.8 07/04/2021 0905   MCH 29.0 04/28/2020 1513   MCHC 33.5 07/04/2021 0905   RDW 14.1 07/04/2021 0905   LYMPHSABS 2.9 07/04/2021 0905   MONOABS 0.6 07/04/2021 0905   EOSABS 0.2 07/04/2021 0905   BASOSABS 0.0 07/04/2021 0905    CMP     Component Value Date/Time   NA 139 07/04/2021 0905   K 4.1 07/04/2021 0905   CL 102 07/04/2021 0905   CO2 29 07/04/2021 0905   GLUCOSE 91 07/04/2021 0905   BUN 17 07/04/2021 0905   CREATININE 1.01 07/04/2021 0905   CREATININE 0.99 04/28/2020 1513   CALCIUM 9.8 07/04/2021 0905   PROT 6.8 07/04/2021 0905   ALBUMIN 4.4 07/04/2021 0905   AST 18 07/04/2021 0905   ALT 18 07/04/2021 0905   ALKPHOS 77 07/04/2021 0905   BILITOT 0.7 07/04/2021 0905  GFRNONAA 75 (L) 10/20/2012 1610   GFRAA 87 (L) 10/20/2012 1610    Assessment: 1.  Epigastric pain: Continues per patient regardless of the increase in Pantoprazole to 40 mg twice daily over the past couple of months; consider ongoing gastritis +/- PUD versus other 2.  Change in  bowel habits: Recent work-up with negative stool studies, still consider metformin as a cause versus IBS versus other  Plan: 1.  At this time recommend patient repeat EGD and colonoscopy for diagnostic reasons.  These were scheduled with Dr. Hilarie Fredrickson in the [facility].  Did provide the patient a detailed list of risks for the procedures and she agrees to proceed. Patient is appropriate for endoscopic procedure(s) in the ambulatory ([place]) setting.  2.  Patient to continue Pantoprazole 40 twice daily for now. 3.  Encouraged the patient to discuss her Metformin with her PCP.  If there is a different alternative for her she may want to try it to see if it helps with bowel movements in the interim. 4.  Patient to follow in clinic per recommendations from Dr. Hilarie Fredrickson after time of procedures.  Ellouise Newer, PA-C [facility] 10/03/2021, 9:58 AM  Cc: Isaac Bliss, [place]*

## 2021-10-09 ENCOUNTER — Other Ambulatory Visit: Payer: Self-pay | Admitting: Internal Medicine

## 2021-10-09 DIAGNOSIS — G4700 Insomnia, unspecified: Secondary | ICD-10-CM

## 2021-10-11 DIAGNOSIS — M5416 Radiculopathy, lumbar region: Secondary | ICD-10-CM | POA: Diagnosis not present

## 2021-10-11 NOTE — Telephone Encounter (Signed)
Last refill per controlled substance database: 02/28/21. Also gets clonazepam from Dr Annamaria Boots every month since 03/14/21  Last OV: 07/04/21 Next OV: none scheduled  Pt states she is taking Lorazepam "every now & then" & take Clonazepam "to help me sleep".

## 2021-10-16 DIAGNOSIS — M5416 Radiculopathy, lumbar region: Secondary | ICD-10-CM | POA: Diagnosis not present

## 2021-10-30 DIAGNOSIS — M5416 Radiculopathy, lumbar region: Secondary | ICD-10-CM | POA: Diagnosis not present

## 2021-10-30 DIAGNOSIS — M47816 Spondylosis without myelopathy or radiculopathy, lumbar region: Secondary | ICD-10-CM | POA: Diagnosis not present

## 2021-11-06 ENCOUNTER — Encounter: Payer: Self-pay | Admitting: Gastroenterology

## 2021-11-12 ENCOUNTER — Encounter: Payer: Self-pay | Admitting: Certified Registered Nurse Anesthetist

## 2021-11-13 ENCOUNTER — Encounter: Payer: Self-pay | Admitting: Internal Medicine

## 2021-11-13 ENCOUNTER — Ambulatory Visit (AMBULATORY_SURGERY_CENTER): Payer: Medicare Other | Admitting: Internal Medicine

## 2021-11-13 ENCOUNTER — Other Ambulatory Visit: Payer: Self-pay

## 2021-11-13 VITALS — BP 173/78 | HR 82 | Temp 98.2°F | Resp 14 | Ht 63.0 in | Wt 202.0 lb

## 2021-11-13 DIAGNOSIS — K2960 Other gastritis without bleeding: Secondary | ICD-10-CM

## 2021-11-13 DIAGNOSIS — R1013 Epigastric pain: Secondary | ICD-10-CM

## 2021-11-13 DIAGNOSIS — R194 Change in bowel habit: Secondary | ICD-10-CM | POA: Diagnosis not present

## 2021-11-13 DIAGNOSIS — K2950 Unspecified chronic gastritis without bleeding: Secondary | ICD-10-CM | POA: Diagnosis not present

## 2021-11-13 DIAGNOSIS — R1030 Lower abdominal pain, unspecified: Secondary | ICD-10-CM

## 2021-11-13 DIAGNOSIS — R195 Other fecal abnormalities: Secondary | ICD-10-CM

## 2021-11-13 DIAGNOSIS — R197 Diarrhea, unspecified: Secondary | ICD-10-CM | POA: Diagnosis not present

## 2021-11-13 MED ORDER — SODIUM CHLORIDE 0.9 % IV SOLN: 500.0000 mL | Freq: Once | INTRAVENOUS | Status: DC

## 2021-11-13 NOTE — Progress Notes (Signed)
1110 Robinul 0.1 mg IV given due large amount of secretions upon assessment.  MD made aware, vss 

## 2021-11-13 NOTE — Progress Notes (Signed)
GASTROENTEROLOGY PROCEDURE H&P NOTE   Primary Care Physician: Isaac Bliss, Rayford Halsted, MD    Reason for Procedure:  Epigastric pain, change in bowel habits, loose explosive stools   Plan:    EGD colonoscopy  Patient is appropriate for endoscopic procedure(s) in the ambulatory ([place]) setting.  The nature of the procedure, as well as the risks, benefits, and alternatives were carefully and thoroughly reviewed with the patient. Ample time for discussion and questions allowed. The patient understood, was satisfied, and agreed to proceed.     HPI: Kayla [name] is a 61 y.o. female who presents for EGD colonoscopy.  Seen in the office by Ellouise Newer, PA-C on 10/03/2021.  See that note for details.  Medical history as below.  Tolerated the prep.  No recent chest pain or shortness of breath.  No abdominal pain today.  Past Medical History:  Diagnosis Date   Allergy    Asthma    Carpal tunnel syndrome of right wrist 38/4536   Complication of anesthesia    hard to wake up after surg. 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[place])    "prothrombin gene factor II" (06/05/2012); states has had no problems   GERD (gastroesophageal reflux disease)    Headache(784.0)    due to allergies   Heart murmur    states no problems   Hyperlipidemia    Hypertension    Mitral valve prolapse    OSA on CPAP    Osteoarthritis    knees   Overactive bladder    PTSD (post-traumatic stress disorder)    Sleep apnea    Type II diabetes mellitus ([place])    NIDDM    Past Surgical History:  Procedure Laterality Date   ARTHRODESIS METATARSALPHALANGEAL JOINT (MTPJ) Right 08/24/2020   Procedure: ARTHRODESIS OF THE METATARSOPHALGEAL JOINT RIGHT FOOT;  Surgeon: Caprice Beaver, DPM;  Location: [facility];  Service: Podiatry;  Laterality: Right;   BREAST BIOPSY Left 04/25/2001   CARPAL TUNNEL  RELEASE Right 10/24/2012   Procedure: CARPAL TUNNEL RELEASE;  Surgeon: Ninetta Lights, MD;  Location: [place];  Service: Orthopedics;  Laterality: Right;  RIGHT CARPAL TUNNEL RELEASE   KNEE ARTHROSCOPY Right 04/01/2008   KNEE ARTHROSCOPY W/ MENISCECTOMY Left 08/16/2011   partial medial   LASIK     bilateral   PUBOVAGINAL SLING  08/27/2001   cysto; suprapubic tube placement   SHOULDER ARTHROSCOPY W/ ROTATOR CUFF REPAIR Left 2004   TOTAL ABDOMINAL HYSTERECTOMY  08/27/2001   TOTAL KNEE ARTHROPLASTY  02/06/2012   Procedure: TOTAL KNEE ARTHROPLASTY;  Surgeon: Ninetta Lights, MD;  Location: [place];  Service: Orthopedics;  Laterality: Right;  total knee right side   TOTAL KNEE ARTHROPLASTY  06/04/2012   Procedure: TOTAL KNEE ARTHROPLASTY;  Surgeon: Ninetta Lights, MD;  Location: [place];  Service: Orthopedics;  Laterality: Left;   ULNAR COLLATERAL LIGAMENT REPAIR Left 11/08/2017   Procedure: REPAIR/RECONSTRUCTION LEFT THUMB ULNAR COLLATERAL LIGAMENT;  Surgeon: Daryll Brod, MD;  Location: [place];  Service: Orthopedics;  Laterality: Left;   VAGINAL HYSTERECTOMY  1980's    Prior to Admission medications   Medication Sig Start Date End Date Taking? Authorizing Provider  atorvastatin (LIPITOR) 20 MG tablet Take 1 tablet by mouth once daily 09/13/21  Yes Isaac Bliss, Rayford Halsted, MD  blood glucose meter kit and supplies  KIT Dispense based on patient and insurance preference. Use up to four times daily as directed. 04/13/21  Yes Isaac Bliss, Rayford Halsted, MD  budesonide-formoterol [facility]) 160-4.5 MCG/ACT inhaler Inhale 2 puffs then rinse mouth, twice daily 03/14/21  Yes Young, Tarri Fuller D, MD  clonazePAM [facility]) 0.5 MG tablet 1 or 2 for sleep if needed 09/14/21  Yes Baird Lyons D, MD  JARDIANCE 25 MG TABS tablet Take 1 tablet by mouth once daily 08/01/21  Yes Isaac Bliss, Rayford Halsted, MD  lisinopril (ZESTRIL) 10 MG tablet Take 1 tablet by mouth once daily 07/07/21  Yes  Isaac Bliss, Rayford Halsted, MD  LORazepam (ATIVAN) 0.5 MG tablet TAKE 1 TABLET BY MOUTH EVERY 8 HOURS AS NEEDED FOR ANXIETY 10/11/21  Yes Isaac Bliss, Rayford Halsted, MD  meloxicam [facility]) 15 MG tablet Take 1 tablet by mouth once daily 05/12/21  Yes Isaac Bliss, Rayford Halsted, MD  metFORMIN (GLUCOPHAGE-XR) 500 MG 24 hr tablet TAKE 1 TABLET BY MOUTH IN THE MORNING AND 1 AT BEDTIME 07/25/21  Yes Isaac Bliss, Rayford Halsted, MD  oxybutynin (DITROPAN XL) 15 MG 24 hr tablet Take 1 tablet by mouth once daily 07/25/21  Yes Isaac Bliss, Rayford Halsted, MD  pantoprazole (PROTONIX) 40 MG tablet Take 1 tablet (40 mg total) by mouth 2 (two) times daily before a meal. 08/09/21  Yes Levin Erp, PA  sertraline (ZOLOFT) 100 MG tablet Take 1 tablet by mouth once daily 07/25/21  Yes Isaac Bliss, Rayford Halsted, MD  venlafaxine [facility]) 75 MG tablet Take 1 tablet by mouth once daily 05/12/21  Yes Isaac Bliss, Rayford Halsted, MD  vitamin B-12 (CYANOCOBALAMIN) 1000 MCG tablet Take 1,000 mcg by mouth daily.   Yes [provider]  albuterol (PROVENTIL HFA) 108 (90 Base) MCG/ACT inhaler Inhale 2 puffs into the lungs every 6 (six) hours as needed for wheezing or shortness of breath. 08/20/18   Isaac Bliss, Rayford Halsted, MD  loperamide (IMODIUM) 2 MG capsule Take 4 mg by mouth as needed for diarrhea or loose stools.    [provider]  OVER THE COUNTER MEDICATION Take 1 capsule by mouth daily. Vitamin A / Zinc Patient not taking: Reported on 11/13/2021    [provider]  rOPINIRole (REQUIP) 0.25 MG tablet TAKE 1 TO 2 TABLETS BY MOUTH BEFORE BEDTIME FOR  RESTLESS  LEG Patient taking differently: Take 0.25-0.5 mg by mouth at bedtime as needed (RLS). TAKE 1 TO 2 TABLETS BY MOUTH BEFORE BEDTIME FOR  RESTLESS  LEG 03/01/20   Deneise Lever, MD    Current Outpatient Medications  Medication Sig Dispense Refill   atorvastatin (LIPITOR) 20 MG tablet Take 1 tablet by mouth once daily 90 tablet 0    blood glucose meter kit and supplies KIT Dispense based on patient and insurance preference. Use up to four times daily as directed. 1 each 0   budesonide-formoterol (SYMBICORT) 160-4.5 MCG/ACT inhaler Inhale 2 puffs then rinse mouth, twice daily 1 each 6   clonazePAM (KLONOPIN) 0.5 MG tablet 1 or 2 for sleep if needed 60 tablet 5   JARDIANCE 25 MG TABS tablet Take 1 tablet by mouth once daily 90 tablet 0   lisinopril (ZESTRIL) 10 MG tablet Take 1 tablet by mouth once daily 90 tablet 1   LORazepam (ATIVAN) 0.5 MG tablet TAKE 1 TABLET BY MOUTH EVERY 8 HOURS AS NEEDED FOR ANXIETY 60 tablet 0   meloxicam (MOBIC) 15 MG tablet Take 1 tablet by mouth once daily 90 tablet 0  metFORMIN (GLUCOPHAGE-XR) 500 MG 24 hr tablet TAKE 1 TABLET BY MOUTH IN THE MORNING AND 1 AT BEDTIME 180 tablet 1   oxybutynin (DITROPAN XL) 15 MG 24 hr tablet Take 1 tablet by mouth once daily 90 tablet 1   pantoprazole (PROTONIX) 40 MG tablet Take 1 tablet (40 mg total) by mouth 2 (two) times daily before a meal. 180 tablet 1   sertraline (ZOLOFT) 100 MG tablet Take 1 tablet by mouth once daily 90 tablet 1   venlafaxine (EFFEXOR) 75 MG tablet Take 1 tablet by mouth once daily 90 tablet 10   vitamin B-12 (CYANOCOBALAMIN) 1000 MCG tablet Take 1,000 mcg by mouth daily.     albuterol (PROVENTIL HFA) 108 (90 Base) MCG/ACT inhaler Inhale 2 puffs into the lungs every 6 (six) hours as needed for wheezing or shortness of breath. 18 g 3   loperamide (IMODIUM) 2 MG capsule Take 4 mg by mouth as needed for diarrhea or loose stools.     OVER THE COUNTER MEDICATION Take 1 capsule by mouth daily. Vitamin A / Zinc (Patient not taking: Reported on 11/13/2021)     rOPINIRole (REQUIP) 0.25 MG tablet TAKE 1 TO 2 TABLETS BY MOUTH BEFORE BEDTIME FOR  RESTLESS  LEG (Patient taking differently: Take 0.25-0.5 mg by mouth at bedtime as needed (RLS). TAKE 1 TO 2 TABLETS BY MOUTH BEFORE BEDTIME FOR  RESTLESS  LEG) 30 tablet 12   Current Facility-Administered  Medications  Medication Dose Route Frequency Provider Last Rate Last Admin   0.9 %  sodium chloride infusion  500 mL Intravenous Once Damarrion Mimbs, Lajuan Lines, MD        Allergies as of 11/13/2021 - Review Complete 11/13/2021  Allergen Reaction Noted   Morphine and related Shortness Of Breath and Itching 02/08/2012    Family History  Problem Relation Age of Onset   COPD Mother    Heart disease Mother    Heart disease Father    Heart attack Brother    Colon cancer Neg Hx    Stomach cancer Neg Hx    Esophageal cancer Neg Hx    Pancreatic cancer Neg Hx     Social History   Socioeconomic History   Marital status: Married    Spouse name: Not on file   Number of children: 2   Years of education: Not on file   Highest education level: Not on file  Occupational History   Occupation: Curator  Tobacco Use   Smoking status: Never   Smokeless tobacco: Never  Vaping Use   Vaping Use: Never used  Substance and Sexual Activity   Alcohol use: No   Drug use: No   Sexual activity: Yes  Other Topics Concern   Not on file  Social History Narrative   Not on file   Social Determinants of Health   Financial Resource Strain: Not on file  Food Insecurity: Not on file  Transportation Needs: Not on file  Physical Activity: Not on file  Stress: Not on file  Social Connections: Not on file  Intimate Partner Violence: Not on file    Physical Exam: Vital signs in last 24 hours: _0  135/65    Pulse 64    Temp 98.2 F (36.8 C)    Ht 5' 3" (1.6 m)    Wt 202 lb (91.6 kg)    SpO2 97%    BMI 35.78 kg/m  GEN: NAD EYE: Sclerae anicteric ENT: MMM CV: Non-tachycardic Pulm: CTA b/l GI: Soft, NT/ND  NEURO:  Alert & Oriented x 3 ° ° °Jay Pyrtle, MD °[facility] ° °11/13/2021 10:58 AM ° °

## 2021-11-13 NOTE — Op Note (Signed)
[place] ?Patient Name: Kayla [name] ?Procedure Date: 11/13/2021 11:04 AM ?MRN: [id number] ?Endoscopist: Jerene Bears , MD ?Age: 61 ?Referring MD:  ?Date of Birth: 29-Jul-1961 ?Gender: Female ?Account #: [id number] ?Procedure:                Colonoscopy ?Indications:              Epigastric abdominal pain, Lower abdominal pain,  ?                          Change in bowel habits, Diarrhea, normal  ?                          colonoscopy 2015 ?Medicines:                Monitored Anesthesia Care ?Procedure:                Pre-Anesthesia Assessment: ?                          - Prior to the procedure, a History and Physical  ?                          was performed, and patient medications and  ?                          allergies were reviewed. The patient's tolerance of  ?                          previous anesthesia was also reviewed. The risks  ?                          and benefits of the procedure and the sedation  ?                          options and risks were discussed with the patient.  ?                          All questions were answered, and informed consent  ?                          was obtained. Prior Anticoagulants: The patient has  ?                          taken no previous anticoagulant or antiplatelet  ?                          agents. ASA Grade Assessment: II - A patient with  ?                          mild systemic disease. After reviewing the risks  ?                          and benefits, the patient was deemed in  ?  satisfactory condition to undergo the procedure. ?                          After obtaining informed consent, the colonoscope  ?                          was passed under direct vision. Throughout the  ?                          procedure, the patient's blood pressure, pulse, and  ?                          oxygen saturations were monitored continuously. The  ?                          PCF-HQ190L Colonoscope was introduced through the   ?                          anus and advanced to the cecum, identified by  ?                          appendiceal orifice and ileocecal valve. The  ?                          colonoscopy was performed without difficulty. The  ?                          patient tolerated the procedure well. The quality  ?                          of the bowel preparation was good. The ileocecal  ?                          valve, appendiceal orifice, and rectum were  ?                          photographed. ?Scope In: 11:21:22 AM ?Scope Out: 11:32:48 AM ?Scope Withdrawal Time: 0 hours 9 minutes 41 seconds  ?Total Procedure Duration: 0 hours 11 minutes 26 seconds  ?Findings:                 The digital rectal exam was normal. ?                          The colon (entire examined portion) appeared  ?                          normal. Biopsies for histology were taken with a  ?                          cold forceps from the right colon and left colon  ?                          for evaluation of microscopic colitis. ?  The retroflexed view of the distal rectum and anal  ?                          verge was normal and showed no anal or rectal  ?                          abnormalities. ?Complications:            No immediate complications. ?Estimated Blood Loss:     Estimated blood loss was minimal. ?Impression:               - The entire examined colon is normal. Biopsied. ?                          - The distal rectum and anal verge are normal on  ?                          retroflexion view. ?Recommendation:           - Patient has a contact number available for  ?                          emergencies. The signs and symptoms of potential  ?                          delayed complications were discussed with the  ?                          patient. Return to normal activities tomorrow.  ?                          Written discharge instructions were provided to the  ?                          patient. ?                           - Resume previous diet. ?                          - Continue present medications. ?                          - Await pathology results. ?                          - If negative biopsies results for  ?                          inflammation/infection then I recommend a course of  ?                          rifaximin 550 mg TID x 14 days for IBS with  ?                          diarrhea. I also agree with PCP visit to discuss  ?  medication changes given possibility of loose stool  ?                          side-effect. Would wait for pathology results  ?                          before any medication changes. ?                          - Office follow-up for persistent symptoms is  ?                          recommended. ?                          - Repeat colonoscopy in 10 years for screening  ?                          purposes. ?Jerene Bears, MD ?11/13/2021 11:39:17 AM ?This report has been signed electronically. ?

## 2021-11-13 NOTE — Progress Notes (Signed)
Report given to PACU, vss 

## 2021-11-13 NOTE — Op Note (Signed)
[facility] ?Patient Name: Kayla [name] ?Procedure Date: 11/13/2021 11:04 AM ?MRN: [id number] ?Endoscopist: Jerene Bears , MD ?Age: 61 ?Referring MD:  ?Date of Birth: 1960/12/13 ?Gender: Female ?Account #: [id number] ?Procedure:                Upper GI endoscopy ?Indications:              Epigastric abdominal pain, Lower abdominal pain,  ?                          change in bowel habits with loose stools and  ?                          diarrhea ?Medicines:                Monitored Anesthesia Care ?Procedure:                Pre-Anesthesia Assessment: ?                          - Prior to the procedure, a History and Physical  ?                          was performed, and patient medications and  ?                          allergies were reviewed. The patient's tolerance of  ?                          previous anesthesia was also reviewed. The risks  ?                          and benefits of the procedure and the sedation  ?                          options and risks were discussed with the patient.  ?                          All questions were answered, and informed consent  ?                          was obtained. Prior Anticoagulants: The patient has  ?                          taken no previous anticoagulant or antiplatelet  ?                          agents. ASA Grade Assessment: II - A patient with  ?                          mild systemic disease. After reviewing the risks  ?                          and benefits, the patient was deemed in  ?  satisfactory condition to undergo the procedure. ?                          After obtaining informed consent, the endoscope was  ?                          passed under direct vision. Throughout the  ?                          procedure, the patient's blood pressure, pulse, and  ?                          oxygen saturations were monitored continuously. The  ?                          Endoscope was introduced through the mouth, and   ?                          advanced to the second part of duodenum. The upper  ?                          GI endoscopy was accomplished without difficulty.  ?                          The patient tolerated the procedure well. ?Scope In: ?Scope Out: ?Findings:                 The examined esophagus was normal. ?                          The entire examined stomach was normal. Biopsies  ?                          were taken with a cold forceps for histology and  ?                          Helicobacter pylori testing. ?                          The examined duodenum was normal. Biopsies for  ?                          histology were taken with a cold forceps for  ?                          evaluation of celiac disease. ?Complications:            No immediate complications. ?Estimated Blood Loss:     Estimated blood loss was minimal. ?Impression:               - Normal esophagus. ?                          - Normal stomach. Biopsied. ?                          -  Normal examined duodenum. Biopsied. ?Recommendation:           - Patient has a contact number available for  ?                          emergencies. The signs and symptoms of potential  ?                          delayed complications were discussed with the  ?                          patient. Return to normal activities tomorrow.  ?                          Written discharge instructions were provided to the  ?                          patient. ?                          - Resume previous diet. ?                          - Continue present medications. ?                          - Await pathology results. ?                          - See the other procedure note for documentation of  ?                          additional recommendations. ?Jerene Bears, MD ?11/13/2021 11:34:50 AM ?This report has been signed electronically. ?

## 2021-11-13 NOTE — Progress Notes (Signed)
VS by DT    

## 2021-11-13 NOTE — Progress Notes (Signed)
Called to room to assist during endoscopic procedure.  Patient ID and intended procedure confirmed with present staff. Received instructions for my participation in the procedure from the performing physician.  

## 2021-11-13 NOTE — Patient Instructions (Signed)
Discharge instructions given. ?Biopsies taken. ?Resume previous medications. ?YOU HAD AN ENDOSCOPIC PROCEDURE TODAY AT [facility]:   Refer to the procedure report that was given to you for any specific questions about what was found during the examination.  If the procedure report does not answer your questions, please call your gastroenterologist to clarify.  If you requested that your care partner not be given the details of your procedure findings, then the procedure report has been included in a sealed envelope for you to review at your convenience later. ? ?YOU SHOULD EXPECT: Some feelings of bloating in the abdomen. Passage of more gas than usual.  Walking can help get rid of the air that was put into your GI tract during the procedure and reduce the bloating. If you had a lower endoscopy (such as a colonoscopy or flexible sigmoidoscopy) you may notice spotting of blood in your stool or on the toilet paper. If you underwent a bowel prep for your procedure, you may not have a normal bowel movement for a few days. ? ?Please Note:  You might notice some irritation and congestion in your nose or some drainage.  This is from the oxygen used during your procedure.  There is no need for concern and it should clear up in a day or so. ? ?SYMPTOMS TO REPORT IMMEDIATELY: ? ?Following lower endoscopy (colonoscopy or flexible sigmoidoscopy): ? Excessive amounts of blood in the stool ? Significant tenderness or worsening of abdominal pains ? Swelling of the abdomen that is new, acute ? Fever of 100?F or higher ? ?Following upper endoscopy (EGD) ? Vomiting of blood or coffee ground material ? New chest pain or pain under the shoulder blades ? Painful or persistently difficult swallowing ? New shortness of breath ? Fever of 100?F or higher ? Black, tarry-looking stools ? ?For urgent or emergent issues, a gastroenterologist can be reached at any hour by calling [phone]. ?Do not use MyChart messaging  for urgent concerns.  ? ? ?DIET:  We do recommend a small meal at first, but then you may proceed to your regular diet.  Drink plenty of fluids but you should avoid alcoholic beverages for 24 hours. ? ?ACTIVITY:  You should plan to take it easy for the rest of today and you should NOT DRIVE or use heavy machinery until tomorrow (because of the sedation medicines used during the test).   ? ?FOLLOW UP: ?Our staff will call the number listed on your records 48-72 hours following your procedure to check on you and address any questions or concerns that you may have regarding the information given to you following your procedure. If we do not reach you, we will leave a message.  We will attempt to reach you two times.  During this call, we will ask if you have developed any symptoms of COVID 19. If you develop any symptoms (ie: fever, flu-like symptoms, shortness of breath, cough etc.) before then, please call [phone].  If you test positive for Covid 19 in the 2 weeks post procedure, please call and report this information to [place].   ? ?If any biopsies were taken you will be contacted by phone or by letter within the next 1-3 weeks.  Please call us at [phone] if you have not heard about the biopsies in 3 weeks.  ? ? ?SIGNATURES/CONFIDENTIALITY: ?You and/or your care partner have signed paperwork which will be entered into your electronic medical record.  These signatures attest to the fact  that that the information above on your After Visit Summary has been reviewed and is understood.  Full responsibility of the confidentiality of this discharge information lies with you and/or your care-partner.  ?

## 2021-11-15 ENCOUNTER — Telehealth: Payer: Self-pay

## 2021-11-15 NOTE — Telephone Encounter (Signed)
Attempted f/u call. No answer, left VM. 

## 2021-11-15 NOTE — Telephone Encounter (Signed)
?  Follow up Call- ? ?Call back number 11/13/2021  ?Post procedure Call Back phone  # [phone]  ?Permission to leave phone message Yes  ?Some recent data might be hidden  ?  ? ?First Follow up call, LVM ?

## 2021-11-21 ENCOUNTER — Other Ambulatory Visit: Payer: Self-pay

## 2021-11-21 MED ORDER — RIFAXIMIN 550 MG PO TABS: 550.0000 mg | ORAL_TABLET | Freq: Three times a day (TID) | ORAL | 0 refills | Status: DC

## 2021-11-26 ENCOUNTER — Other Ambulatory Visit: Payer: Self-pay | Admitting: Internal Medicine

## 2021-11-26 DIAGNOSIS — E7800 Pure hypercholesterolemia, unspecified: Secondary | ICD-10-CM

## 2021-11-26 DIAGNOSIS — I10 Essential (primary) hypertension: Secondary | ICD-10-CM

## 2021-11-26 DIAGNOSIS — E119 Type 2 diabetes mellitus without complications: Secondary | ICD-10-CM

## 2021-11-29 ENCOUNTER — Telehealth: Payer: Self-pay | Admitting: Physician Assistant

## 2021-11-29 NOTE — Telephone Encounter (Signed)
Unfortunately this seems to be occurring more and more ?This is a commercially insured patient and rifaximin normally has good coverage ?Is this $700 after insurance coverage? ?We can asked the rep for clarification ? ?May need to provide samples if not available as this is the best available medication for IBS-D ? ?Please let patient know we are working on ?[place] ? ?

## 2021-11-29 NOTE — Telephone Encounter (Signed)
Patient called stating the medication sent to her pharmacy was $700.  She wants to know if there is an alternative medication or if there is some type of patient assistance available.  Please call patient and advise.  Thank you. ?

## 2021-11-29 NOTE — Telephone Encounter (Signed)
This is actually a Medicare patient who may have a deductible to meet as well.  I will put her on the list for samples.  If you see one that is definitely commercial send it to me and I will try to investigate.  I will also reach out to [place] to make sure there's nothing we are overlooking. ?

## 2021-11-29 NOTE — Telephone Encounter (Signed)
It looks like the most recent medication sent in for the patient was Xifaxan. Please advise what you would like to do. ?

## 2021-11-30 NOTE — Telephone Encounter (Signed)
No problem.  I'm hoping to get more samples within the week.   ?

## 2021-11-30 NOTE — Telephone Encounter (Signed)
Thanks Magda Paganini ?I know it can be difficult to constantly ask for samples. I appreciate your efforts to help our patients. ?Hopefully this will be easier in the not to distance future when rifaximin generic becomes available ?JMP ?

## 2021-12-01 DIAGNOSIS — M25552 Pain in left hip: Secondary | ICD-10-CM | POA: Diagnosis not present

## 2021-12-01 NOTE — Telephone Encounter (Signed)
Patient called to follow up with [place] regarding XIFAXAN. Patient wants to know if there is an alternative option. Please advise. ?

## 2021-12-01 NOTE — Telephone Encounter (Signed)
Lm on vm that I was in the process of trying to get  samples of Xifaxan and am hoping to get them next week.  I will call her when they can be picked up ?

## 2021-12-05 ENCOUNTER — Ambulatory Visit (INDEPENDENT_AMBULATORY_CARE_PROVIDER_SITE_OTHER): Payer: Medicare Other | Admitting: Internal Medicine

## 2021-12-05 ENCOUNTER — Encounter: Payer: Self-pay | Admitting: Internal Medicine

## 2021-12-05 VITALS — BP 124/84 | HR 75 | Temp 98.2°F | Wt 207.8 lb

## 2021-12-05 DIAGNOSIS — K219 Gastro-esophageal reflux disease without esophagitis: Secondary | ICD-10-CM | POA: Diagnosis not present

## 2021-12-05 DIAGNOSIS — E7800 Pure hypercholesterolemia, unspecified: Secondary | ICD-10-CM | POA: Diagnosis not present

## 2021-12-05 DIAGNOSIS — I10 Essential (primary) hypertension: Secondary | ICD-10-CM | POA: Diagnosis not present

## 2021-12-05 DIAGNOSIS — E119 Type 2 diabetes mellitus without complications: Secondary | ICD-10-CM | POA: Diagnosis not present

## 2021-12-05 LAB — POCT GLYCOSYLATED HEMOGLOBIN (HGB A1C): Hemoglobin A1C: 6.6 % — AB (ref 4.0–5.6)

## 2021-12-05 MED ORDER — ATORVASTATIN CALCIUM 20 MG PO TABS: 20.0000 mg | ORAL_TABLET | Freq: Every day | ORAL | 1 refills | Status: DC

## 2021-12-05 NOTE — Progress Notes (Signed)
? ? ? ?Established Patient Office Visit ? ? ? ? ?This visit occurred during the SARS-CoV-2 public health emergency.  Safety protocols were in place, including screening questions prior to the visit, additional usage of staff PPE, and extensive cleaning of exam room while observing appropriate contact time as indicated for disinfecting solutions.  ? ? ?CC/Reason for Visit: 49-month follow-up chronic medical conditions ? ?HPI: Kayla [name] is a 62 y.o. female who is coming in today for the above mentioned reasons. Past Medical History is significant for:  Hypertension, hyperlipidemia, type 2 diabetes, obstructive sleep apnea, vitamin D deficiency, morbid obesity GERD.  Since I last saw her she had an EGD and colonoscopy without major findings.  She has transition to [facility] and is having difficulty covering her prescriptions.  In regards to medications that I prescribe the main concern is with Jardiance.  She is otherwise doing well and has no acute concerns or complaints. ? ? ?Past Medical/Surgical History: ?Past Medical History:  ?Diagnosis Date  ? Allergy   ? Asthma   ? Carpal tunnel syndrome of right wrist 10/2012  ? Complication of anesthesia   ? hard to wake up after surg. 06/04/2012 and CO2 went up to 78  ? Depressive disorder, not elsewhere classified   ? Exertional dyspnea   ? occasionally  ? 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 "prothrombin gene factor II" (06/05/2012); states has had no problems  ? GERD (gastroesophageal reflux disease)   ? Headache(784.0)   ? due to allergies  ? Heart murmur   ? states no problems  ? Hyperlipidemia   ? Hypertension   ? Mitral valve prolapse   ? OSA on CPAP   ? Osteoarthritis   ? knees  ? Overactive bladder   ? PTSD (post-traumatic stress disorder)   ? Sleep apnea   ? Type II diabetes mellitus ([place])   ? NIDDM  ? ? ?Past Surgical History:  ?Procedure Laterality Date  ? ARTHRODESIS METATARSALPHALANGEAL JOINT (MTPJ) Right 08/24/2020  ? Procedure: ARTHRODESIS OF THE  METATARSOPHALGEAL JOINT RIGHT FOOT;  Surgeon: Caprice Beaver, DPM;  Location: [facility];  Service: Podiatry;  Laterality: Right;  ? BREAST BIOPSY Left 04/25/2001  ? CARPAL TUNNEL RELEASE Right 10/24/2012  ? Procedure: CARPAL TUNNEL RELEASE;  Surgeon: Ninetta Lights, MD;  Location: [place];  Service: Orthopedics;  Laterality: Right;  RIGHT CARPAL TUNNEL RELEASE  ? KNEE ARTHROSCOPY Right 04/01/2008  ? KNEE ARTHROSCOPY W/ MENISCECTOMY Left 08/16/2011  ? partial medial  ? LASIK    ? bilateral  ? PUBOVAGINAL SLING  08/27/2001  ? cysto; suprapubic tube placement  ? SHOULDER ARTHROSCOPY W/ ROTATOR CUFF REPAIR Left 2004  ? TOTAL ABDOMINAL HYSTERECTOMY  08/27/2001  ? TOTAL KNEE ARTHROPLASTY  02/06/2012  ? Procedure: TOTAL KNEE ARTHROPLASTY;  Surgeon: Ninetta Lights, MD;  Location: [place];  Service: Orthopedics;  Laterality: Right;  total knee right side  ? TOTAL KNEE ARTHROPLASTY  06/04/2012  ? Procedure: TOTAL KNEE ARTHROPLASTY;  Surgeon: Ninetta Lights, MD;  Location: [place];  Service: Orthopedics;  Laterality: Left;  ? ULNAR COLLATERAL LIGAMENT REPAIR Left 11/08/2017  ? Procedure: REPAIR/RECONSTRUCTION LEFT THUMB ULNAR COLLATERAL LIGAMENT;  Surgeon: Daryll Brod, MD;  Location: [place];  Service: Orthopedics;  Laterality: Left;  ? VAGINAL HYSTERECTOMY  1980's  ? ? ?Social History: ? reports that she has never smoked. She has never used smokeless tobacco. She reports that she does not drink alcohol and does not use  drugs. ? ?Allergies: ?Allergies  ?Allergen Reactions  ? Morphine And Related Shortness Of Breath and Itching  ? ? ?Family History:  ?Family History  ?Problem Relation Age of Onset  ? COPD Mother   ? Heart disease Mother   ? Heart disease Father   ? Heart attack Brother   ? Colon cancer Neg Hx   ? Stomach cancer Neg Hx   ? Esophageal cancer Neg Hx   ? Pancreatic cancer Neg Hx   ? ? ? ?Current Outpatient Medications:  ?  albuterol (PROVENTIL HFA) 108 (90 Base) MCG/ACT inhaler, Inhale 2  puffs into the lungs every 6 (six) hours as needed for wheezing or shortness of breath., Disp: 18 g, Rfl: 3 ?  blood glucose meter kit and supplies KIT, Dispense based on patient and insurance preference. Use up to four times daily as directed., Disp: 1 each, Rfl: 0 ?  budesonide-formoterol (SYMBICORT) 160-4.5 MCG/ACT inhaler, Inhale 2 puffs then rinse mouth, twice daily, Disp: 1 each, Rfl: 6 ?  clonazePAM (KLONOPIN) 0.5 MG tablet, 1 or 2 for sleep if needed, Disp: 60 tablet, Rfl: 5 ?  JARDIANCE 25 MG TABS tablet, Take 1 tablet by mouth once daily, Disp: 90 tablet, Rfl: 0 ?  lisinopril (ZESTRIL) 10 MG tablet, Take 1 tablet by mouth once daily, Disp: 90 tablet, Rfl: 1 ?  loperamide (IMODIUM) 2 MG capsule, Take 4 mg by mouth as needed for diarrhea or loose stools., Disp: , Rfl:  ?  LORazepam (ATIVAN) 0.5 MG tablet, TAKE 1 TABLET BY MOUTH EVERY 8 HOURS AS NEEDED FOR ANXIETY, Disp: 60 tablet, Rfl: 0 ?  meloxicam (MOBIC) 15 MG tablet, Take 1 tablet by mouth once daily, Disp: 90 tablet, Rfl: 0 ?  metFORMIN (GLUCOPHAGE-XR) 500 MG 24 hr tablet, TAKE 1 TABLET BY MOUTH IN THE MORNING AND 1 AT BEDTIME, Disp: 180 tablet, Rfl: 1 ?  OVER THE COUNTER MEDICATION, Take 1 capsule by mouth daily. Vitamin A / Zinc, Disp: , Rfl:  ?  oxybutynin (DITROPAN XL) 15 MG 24 hr tablet, Take 1 tablet by mouth once daily, Disp: 90 tablet, Rfl: 1 ?  pantoprazole (PROTONIX) 40 MG tablet, Take 1 tablet (40 mg total) by mouth 2 (two) times daily before a meal., Disp: 180 tablet, Rfl: 1 ?  rifaximin (XIFAXAN) 550 MG TABS tablet, Take 1 tablet (550 mg total) by mouth 3 (three) times daily., Disp: 42 tablet, Rfl: 0 ?  rOPINIRole (REQUIP) 0.25 MG tablet, TAKE 1 TO 2 TABLETS BY MOUTH BEFORE BEDTIME FOR  RESTLESS  LEG (Patient taking differently: Take 0.25-0.5 mg by mouth at bedtime as needed (RLS). TAKE 1 TO 2 TABLETS BY MOUTH BEFORE BEDTIME FOR  RESTLESS  LEG), Disp: 30 tablet, Rfl: 12 ?  sertraline (ZOLOFT) 100 MG tablet, Take 1 tablet by mouth once  daily, Disp: 90 tablet, Rfl: 1 ?  venlafaxine (EFFEXOR) 75 MG tablet, Take 1 tablet by mouth once daily, Disp: 90 tablet, Rfl: 10 ?  vitamin B-12 (CYANOCOBALAMIN) 1000 MCG tablet, Take 1,000 mcg by mouth daily., Disp: , Rfl:  ?  atorvastatin (LIPITOR) 20 MG tablet, Take 1 tablet (20 mg total) by mouth daily., Disp: 90 tablet, Rfl: 1 ? ?Review of Systems:  ?Constitutional: Denies fever, chills, diaphoresis, appetite change and fatigue.  ?HEENT: Denies photophobia, eye pain, redness, hearing loss, ear pain, congestion, sore throat, rhinorrhea, sneezing, mouth sores, trouble swallowing, neck pain, neck stiffness and tinnitus.   ?Respiratory: Denies SOB, DOE, cough, chest tightness,  and wheezing.   ?  Cardiovascular: Denies chest pain, palpitations and leg swelling.  ?Gastrointestinal: Denies nausea, vomiting, abdominal pain, diarrhea, constipation, blood in stool and abdominal distention.  ?Genitourinary: Denies dysuria, urgency, frequency, hematuria, flank pain and difficulty urinating.  ?Endocrine: Denies: hot or cold intolerance, sweats, changes in hair or nails, polyuria, polydipsia. ?Musculoskeletal: Denies myalgias, back pain, joint swelling, arthralgias and gait problem.  ?Skin: Denies pallor, rash and wound.  ?Neurological: Denies dizziness, seizures, syncope, weakness, light-headedness, numbness and headaches.  ?Hematological: Denies adenopathy. Easy bruising, personal or family bleeding history  ?Psychiatric/Behavioral: Denies suicidal ideation, mood changes, confusion, nervousness, sleep disturbance and agitation ? ? ? ?Physical Exam: ?Vitals:  ? 12/05/21 1054  ?BP: 124/84  ?Pulse: 75  ?Temp: 98.2 ?F (36.8 ?C)  ?TempSrc: Oral  ?SpO2: 98%  ?Weight: 207 lb 12.8 oz (94.3 kg)  ? ? ?Body mass index is 36.81 kg/m?. ? ? ?Constitutional: NAD, calm, comfortable ?Eyes: PERRL, lids and conjunctivae normal, wears corrective lenses ?ENMT: Mucous membranes are moist.  ?Respiratory: clear to auscultation bilaterally, no  wheezing, no crackles. Normal respiratory effort. No accessory muscle use.  ?Cardiovascular: Regular rate and rhythm, no murmurs / rubs / gallops. No extremity edema. ?Neurologic: Grossly intact and nonfocal ?Psychia

## 2021-12-08 ENCOUNTER — Other Ambulatory Visit: Payer: Self-pay | Admitting: Internal Medicine

## 2021-12-08 DIAGNOSIS — E119 Type 2 diabetes mellitus without complications: Secondary | ICD-10-CM

## 2021-12-08 NOTE — Telephone Encounter (Signed)
I have contacted patient to advise that we have placed Xifaxan 42 tablets at front desk for her to pick up. She verbalizes understanding. ?

## 2022-01-29 ENCOUNTER — Other Ambulatory Visit: Payer: Self-pay | Admitting: Internal Medicine

## 2022-01-29 DIAGNOSIS — E119 Type 2 diabetes mellitus without complications: Secondary | ICD-10-CM

## 2022-03-03 ENCOUNTER — Other Ambulatory Visit: Payer: Self-pay | Admitting: Internal Medicine

## 2022-03-03 DIAGNOSIS — E119 Type 2 diabetes mellitus without complications: Secondary | ICD-10-CM

## 2022-03-06 ENCOUNTER — Other Ambulatory Visit: Payer: Self-pay | Admitting: Internal Medicine

## 2022-03-06 DIAGNOSIS — E119 Type 2 diabetes mellitus without complications: Secondary | ICD-10-CM

## 2022-03-14 ENCOUNTER — Ambulatory Visit: Payer: Medicare Other | Admitting: Internal Medicine

## 2022-03-15 ENCOUNTER — Encounter: Payer: Self-pay | Admitting: Internal Medicine

## 2022-03-15 ENCOUNTER — Ambulatory Visit (INDEPENDENT_AMBULATORY_CARE_PROVIDER_SITE_OTHER): Payer: Medicare Other | Admitting: Internal Medicine

## 2022-03-15 VITALS — BP 130/68 | HR 78 | Temp 98.1°F | Ht 63.0 in | Wt 206.7 lb

## 2022-03-15 DIAGNOSIS — I10 Essential (primary) hypertension: Secondary | ICD-10-CM | POA: Diagnosis not present

## 2022-03-15 DIAGNOSIS — J069 Acute upper respiratory infection, unspecified: Secondary | ICD-10-CM | POA: Diagnosis not present

## 2022-03-15 DIAGNOSIS — E559 Vitamin D deficiency, unspecified: Secondary | ICD-10-CM | POA: Diagnosis not present

## 2022-03-15 DIAGNOSIS — F339 Major depressive disorder, recurrent, unspecified: Secondary | ICD-10-CM

## 2022-03-15 DIAGNOSIS — E7800 Pure hypercholesterolemia, unspecified: Secondary | ICD-10-CM | POA: Diagnosis not present

## 2022-03-15 DIAGNOSIS — E119 Type 2 diabetes mellitus without complications: Secondary | ICD-10-CM

## 2022-03-15 LAB — POCT GLYCOSYLATED HEMOGLOBIN (HGB A1C): Hemoglobin A1C: 6.4 % — AB (ref 4.0–5.6)

## 2022-03-15 NOTE — Progress Notes (Signed)
Established Patient Office Visit     CC/Reason for Visit: 12-month follow-up chronic medical conditions  HPI: Kayla [name] is a 61 y.o. female who is coming in today for the above mentioned reasons. Past Medical History is significant for: Hypertension, hyperlipidemia, type 2 diabetes, obstructive sleep apnea, vitamin D deficiency, morbid obesity and GERD.  About 2 days ago she started having nasal congestion, runny nose, some yellowish nasal secretions.  Yesterday she started taking Mucinex and Sudafed.  Her and her GI physician have concerns about staying on metformin due to ongoing diarrhea.   Past Medical/Surgical History: Past Medical History:  Diagnosis Date   Allergy    Asthma    Carpal tunnel syndrome of right wrist 48/8891   Complication of anesthesia    hard to wake up after surg. 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[place])    "prothrombin gene factor II" (06/05/2012); states has had no problems   GERD (gastroesophageal reflux disease)    Headache(784.0)    due to allergies   Heart murmur    states no problems   Hyperlipidemia    Hypertension    Mitral valve prolapse    OSA on CPAP    Osteoarthritis    knees   Overactive bladder    PTSD (post-traumatic stress disorder)    Sleep apnea    Type II diabetes mellitus ([place])    NIDDM    Past Surgical History:  Procedure Laterality Date   ARTHRODESIS METATARSALPHALANGEAL JOINT (MTPJ) Right 08/24/2020   Procedure: ARTHRODESIS OF THE METATARSOPHALGEAL JOINT RIGHT FOOT;  Surgeon: Caprice Beaver, DPM;  Location: [facility];  Service: Podiatry;  Laterality: Right;   BREAST BIOPSY Left 04/25/2001   CARPAL TUNNEL RELEASE Right 10/24/2012   Procedure: CARPAL TUNNEL RELEASE;  Surgeon: Ninetta Lights, MD;  Location: [place];  Service: Orthopedics;  Laterality: Right;  RIGHT CARPAL TUNNEL RELEASE    KNEE ARTHROSCOPY Right 04/01/2008   KNEE ARTHROSCOPY W/ MENISCECTOMY Left 08/16/2011   partial medial   LASIK     bilateral   PUBOVAGINAL SLING  08/27/2001   cysto; suprapubic tube placement   SHOULDER ARTHROSCOPY W/ ROTATOR CUFF REPAIR Left 2004   TOTAL ABDOMINAL HYSTERECTOMY  08/27/2001   TOTAL KNEE ARTHROPLASTY  02/06/2012   Procedure: TOTAL KNEE ARTHROPLASTY;  Surgeon: Ninetta Lights, MD;  Location: [place];  Service: Orthopedics;  Laterality: Right;  total knee right side   TOTAL KNEE ARTHROPLASTY  06/04/2012   Procedure: TOTAL KNEE ARTHROPLASTY;  Surgeon: Ninetta Lights, MD;  Location: [place];  Service: Orthopedics;  Laterality: Left;   ULNAR COLLATERAL LIGAMENT REPAIR Left 11/08/2017   Procedure: REPAIR/RECONSTRUCTION LEFT THUMB ULNAR COLLATERAL LIGAMENT;  Surgeon: Daryll Brod, MD;  Location: [place];  Service: Orthopedics;  Laterality: Left;   VAGINAL HYSTERECTOMY  1980's    Social History:  reports that she has never smoked. She has never used smokeless tobacco. She reports that she does not drink alcohol and does not use drugs.  Allergies: Allergies  Allergen Reactions   Morphine And Related Shortness Of Breath and Itching    Family History:  Family History  Problem Relation Age of Onset   COPD Mother    Heart disease Mother    Heart disease Father    Heart attack Brother    Colon cancer Neg Hx  Stomach cancer Neg Hx    Esophageal cancer Neg Hx    Pancreatic cancer Neg Hx      Current Outpatient Medications:    albuterol (PROVENTIL HFA) 108 (90 Base) MCG/ACT inhaler, Inhale 2 puffs into the lungs every 6 (six) hours as needed for wheezing or shortness of breath., Disp: 18 g, Rfl: 3   atorvastatin (LIPITOR) 20 MG tablet, Take 1 tablet (20 mg total) by mouth daily., Disp: 90 tablet, Rfl: 1   blood glucose meter kit and supplies KIT, Dispense based on patient and insurance preference. Use up to four times daily as directed., Disp: 1 each, Rfl: 0    budesonide-formoterol (SYMBICORT) 160-4.5 MCG/ACT inhaler, Inhale 2 puffs then rinse mouth, twice daily, Disp: 1 each, Rfl: 6   clonazePAM (KLONOPIN) 0.5 MG tablet, 1 or 2 for sleep if needed, Disp: 60 tablet, Rfl: 5   JARDIANCE 25 MG TABS tablet, Take 1 tablet by mouth once daily, Disp: 90 tablet, Rfl: 0   lisinopril (ZESTRIL) 10 MG tablet, Take 1 tablet by mouth once daily, Disp: 90 tablet, Rfl: 1   loperamide (IMODIUM) 2 MG capsule, Take 4 mg by mouth as needed for diarrhea or loose stools., Disp: , Rfl:    LORazepam (ATIVAN) 0.5 MG tablet, TAKE 1 TABLET BY MOUTH EVERY 8 HOURS AS NEEDED FOR ANXIETY, Disp: 60 tablet, Rfl: 0   meloxicam (MOBIC) 15 MG tablet, Take 1 tablet by mouth once daily, Disp: 90 tablet, Rfl: 1   metFORMIN (GLUCOPHAGE-XR) 500 MG 24 hr tablet, TAKE 1 TABLET BY MOUTH IN THE MORNING AND 1 AT BEDTIME, Disp: 180 tablet, Rfl: 1   OVER THE COUNTER MEDICATION, Take 1 capsule by mouth daily. Vitamin A / Zinc, Disp: , Rfl:    oxybutynin (DITROPAN XL) 15 MG 24 hr tablet, Take 1 tablet by mouth once daily, Disp: 90 tablet, Rfl: 1   pantoprazole (PROTONIX) 40 MG tablet, Take 1 tablet by mouth once daily, Disp: 90 tablet, Rfl: 0   rifaximin (XIFAXAN) 550 MG TABS tablet, Take 1 tablet (550 mg total) by mouth 3 (three) times daily., Disp: 42 tablet, Rfl: 0   rOPINIRole (REQUIP) 0.25 MG tablet, TAKE 1 TO 2 TABLETS BY MOUTH BEFORE BEDTIME FOR  RESTLESS  LEG (Patient taking differently: Take 0.25-0.5 mg by mouth at bedtime as needed (RLS). TAKE 1 TO 2 TABLETS BY MOUTH BEFORE BEDTIME FOR  RESTLESS  LEG), Disp: 30 tablet, Rfl: 12   sertraline (ZOLOFT) 100 MG tablet, Take 1 tablet by mouth once daily, Disp: 90 tablet, Rfl: 1   venlafaxine (EFFEXOR) 75 MG tablet, Take 1 tablet by mouth once daily, Disp: 90 tablet, Rfl: 10   vitamin B-12 (CYANOCOBALAMIN) 1000 MCG tablet, Take 1,000 mcg by mouth daily., Disp: , Rfl:   Review of Systems:  Constitutional: Denies fever, chills, diaphoresis, appetite  change and fatigue.  HEENT: Denies photophobia, eye pain, redness, hearing loss, mouth sores, trouble swallowing, neck pain, neck stiffness and tinnitus.   Respiratory: Denies SOB, DOE, cough, chest tightness,  and wheezing.   Cardiovascular: Denies chest pain, palpitations and leg swelling.  Gastrointestinal: Denies nausea, vomiting, abdominal pain, diarrhea, constipation, blood in stool and abdominal distention.  Genitourinary: Denies dysuria, urgency, frequency, hematuria, flank pain and difficulty urinating.  Endocrine: Denies: hot or cold intolerance, sweats, changes in hair or nails, polyuria, polydipsia. Musculoskeletal: Denies myalgias, back pain, joint swelling, arthralgias and gait problem.  Skin: Denies pallor, rash and wound.  Neurological: Denies dizziness, seizures, syncope, weakness, light-headedness, numbness  and headaches.  Hematological: Denies adenopathy. Easy bruising, personal or family bleeding history  Psychiatric/Behavioral: Denies suicidal ideation, mood changes, confusion, nervousness, sleep disturbance and agitation    Physical Exam: Vitals:   03/15/22 1407  BP: 130/68  Pulse: 78  Temp: 98.1 F (36.7 C)  TempSrc: Oral  SpO2: 98%  Weight: 206 lb 11.2 oz (93.8 kg)  Height: $Remove'5\' 3"'NNvRuRo$  (1.6 m)    Body mass index is 36.62 kg/m.   Constitutional: NAD, calm, comfortable Eyes: PERRL, lids and conjunctivae normal, wears corrective lenses ENMT: Mucous membranes are moist. Posterior pharynx is erythematous but clear of any exudate or lesions. Normal dentition. Tympanic membrane is pearly white, no erythema or bulging. Respiratory: clear to auscultation bilaterally, no wheezing, no crackles. Normal respiratory effort. No accessory muscle use.  Cardiovascular: Regular rate and rhythm, no murmurs / rubs / gallops. No extremity edema.  Psychiatric: Normal judgment and insight. Alert and oriented x 3. Normal mood.    Impression and Plan:  Diabetes mellitus with  coincident hypertension ([place])  - Plan: POC HgB A1c -Hemoglobin shows good control at 6.4. -Okay to discontinue metformin because of diarrhea, recheck A1c in 3 months.  Vitamin D deficiency -Check vitamin D with next CPE.  Morbid obesity ([place]) -Discussed healthy lifestyle, including increased physical activity and better food choices to promote weight loss.  Depression, recurrent ([facility]) -Mood is stable.  Pure hypercholesterolemia -LDL was 88 in October 2022, she is on atorvastatin 20 mg, recheck lipids when she returns fasting in 3 months.  Viral URI -Given exam findings, PNA, pharyngitis, ear infection are not likely, hence abx have not been prescribed. -Have advised rest, fluids, OTC antihistamines, cough suppressants and mucinex. -RTC if no improvement in 10-14 days.     Time spent:32 minutes reviewing chart, interviewing and examining patient and formulating plan of care.    Lelon Frohlich, MD [facility]

## 2022-04-05 DIAGNOSIS — Z885 Allergy status to narcotic agent status: Secondary | ICD-10-CM | POA: Diagnosis not present

## 2022-04-05 DIAGNOSIS — T1501XA Foreign body in cornea, right eye, initial encounter: Secondary | ICD-10-CM | POA: Diagnosis not present

## 2022-04-05 DIAGNOSIS — T1591XA Foreign body on external eye, part unspecified, right eye, initial encounter: Secondary | ICD-10-CM | POA: Diagnosis not present

## 2022-04-07 ENCOUNTER — Other Ambulatory Visit: Payer: Self-pay | Admitting: Internal Medicine

## 2022-04-07 DIAGNOSIS — F339 Major depressive disorder, recurrent, unspecified: Secondary | ICD-10-CM

## 2022-04-07 DIAGNOSIS — E119 Type 2 diabetes mellitus without complications: Secondary | ICD-10-CM

## 2022-04-21 ENCOUNTER — Other Ambulatory Visit: Payer: Self-pay | Admitting: Internal Medicine

## 2022-04-25 ENCOUNTER — Ambulatory Visit
Admission: RE | Admit: 2022-04-25 | Discharge: 2022-04-25 | Disposition: A | Discharge status: Home / Self Care | Payer: Medicare Other | Source: Ambulatory Visit | Attending: Nurse Practitioner | Admitting: Nurse Practitioner

## 2022-04-25 VITALS — BP 121/75 | HR 105 | Temp 99.1°F | Resp 16

## 2022-04-25 DIAGNOSIS — R051 Acute cough: Secondary | ICD-10-CM | POA: Diagnosis not present

## 2022-04-25 DIAGNOSIS — J0140 Acute pansinusitis, unspecified: Secondary | ICD-10-CM

## 2022-04-25 DIAGNOSIS — H65192 Other acute nonsuppurative otitis media, left ear: Secondary | ICD-10-CM

## 2022-04-25 MED ORDER — BENZONATATE 100 MG PO CAPS: 100.0000 mg | ORAL_CAPSULE | Freq: Three times a day (TID) | ORAL | 0 refills | Status: DC | PRN

## 2022-04-25 MED ORDER — AMOXICILLIN-POT CLAVULANATE 875-125 MG PO TABS: 1.0000 | ORAL_TABLET | Freq: Two times a day (BID) | ORAL | 0 refills | Status: AC

## 2022-04-25 MED ORDER — PROMETHAZINE-DM 6.25-15 MG/5ML PO SYRP: 5.0000 mL | ORAL_SOLUTION | Freq: Every evening | ORAL | 0 refills | Status: DC | PRN

## 2022-04-25 MED ORDER — FLUTICASONE PROPIONATE 50 MCG/ACT NA SUSP
1.0000 | Freq: Every day | NASAL | 2 refills | Status: AC
Start: 2022-04-25 — End: ?

## 2022-04-25 NOTE — ED Provider Notes (Signed)
[facility]    CSN: [id number] Arrival date & time: 04/25/22  1422      History   Chief Complaint Chief Complaint  Patient presents with   Sore Throat    Entered by patient   Otalgia    HPI Kayla [name] is a 61 y.o. female.   Patient presents for 2 months of left ear pain that has worsened.  Also endorses sore throat and nasal congestion that have been present for about a week.  She endorses dry cough, postnasal drainage, sinus pressure, headache, decreased appetite, and fatigue.  She denies fever, body aches, chills, significant congestion, shortness of breath/wheezing, chest pain, ear drainage, abdominal pain, nausea/vomiting, change in bowel movement.    Patient reports medical history significant for allergies for which she takes an over-the-counter oral antihistamine.  Also has a history of asthma, hypertension, diabetes, obesity, and high cholesterol.  Patient denies antibiotic use in the past 3 months.  Denies antibiotic allergies.    Past Medical History:  Diagnosis Date   Allergy    Asthma    Carpal tunnel syndrome of right wrist 62/2297   Complication of anesthesia    hard to wake up after surg. 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 factor II" (06/05/2012); states has had no problems   GERD (gastroesophageal reflux disease)    Headache(784.0)    due to allergies   Heart murmur    states no problems   Hyperlipidemia    Hypertension    Mitral valve prolapse    OSA on CPAP    Osteoarthritis    knees   Overactive bladder    PTSD (post-traumatic stress disorder)    Sleep apnea    Type II diabetes mellitus ([place])    NIDDM    Patient Active Problem List   Diagnosis Date Noted   Vitamin D deficiency 04/29/2020   Menopausal symptom 11/19/2018   Sprain and strain of metacarpophalangeal (joint) of hand  11/15/2017   Allergic asthma, mild intermittent, uncomplicated 98/92/1194   Restless leg syndrome 05/12/2017   Diabetes mellitus with coincident hypertension ([place]) 02/13/2016   Morbid obesity ([place]) 01/08/2016   Seasonal and perennial allergic rhinitis 03/21/2014   Leg weakness, bilateral 05/14/2013   Difficulty walking 05/14/2013   Obstructive sleep apnea 01/04/2011   INSOMNIA 11/10/2009   Osteoarthritis 08/10/2008   ACUTE CYSTITIS 03/16/2008   Pure hypercholesterolemia 10/31/2007   Depression, recurrent ([place]) 10/31/2007   HYPERTENSION, BENIGN ESSENTIAL 04/21/2007   SHOULDER PAIN, BILATERAL 04/21/2007    Past Surgical History:  Procedure Laterality Date   ARTHRODESIS METATARSALPHALANGEAL JOINT (MTPJ) Right 08/24/2020   Procedure: ARTHRODESIS OF THE METATARSOPHALGEAL JOINT RIGHT FOOT;  Surgeon: Caprice Beaver, DPM;  Location: [facility];  Service: Podiatry;  Laterality: Right;   BREAST BIOPSY Left 04/25/2001   CARPAL TUNNEL RELEASE Right 10/24/2012   Procedure: CARPAL TUNNEL RELEASE;  Surgeon: Ninetta Lights, MD;  Location: [place];  Service: Orthopedics;  Laterality: Right;  RIGHT CARPAL TUNNEL RELEASE   KNEE ARTHROSCOPY Right 04/01/2008   KNEE ARTHROSCOPY W/ MENISCECTOMY Left 08/16/2011   partial medial   LASIK     bilateral   PUBOVAGINAL SLING  08/27/2001   cysto; suprapubic tube placement   SHOULDER ARTHROSCOPY W/ ROTATOR CUFF REPAIR Left 2004   TOTAL ABDOMINAL HYSTERECTOMY  08/27/2001   TOTAL KNEE  ARTHROPLASTY  02/06/2012   Procedure: TOTAL KNEE ARTHROPLASTY;  Surgeon: Ninetta Lights, MD;  Location: [place];  Service: Orthopedics;  Laterality: Right;  total knee right side   TOTAL KNEE ARTHROPLASTY  06/04/2012   Procedure: TOTAL KNEE ARTHROPLASTY;  Surgeon: Ninetta Lights, MD;  Location: [place];  Service: Orthopedics;  Laterality: Left;   ULNAR COLLATERAL LIGAMENT REPAIR Left 11/08/2017   Procedure: REPAIR/RECONSTRUCTION LEFT THUMB ULNAR COLLATERAL LIGAMENT;   Surgeon: Daryll Brod, MD;  Location: [place];  Service: Orthopedics;  Laterality: Left;   VAGINAL HYSTERECTOMY  1980's    OB History   No obstetric history on file.      Home Medications    Prior to Admission medications   Medication Sig Start Date End Date Taking? Authorizing Provider  amoxicillin-clavulanate (AUGMENTIN) 875-125 MG tablet Take 1 tablet by mouth 2 (two) times daily for 7 days. 04/25/22 05/02/22 Yes Eulogio Bear, NP  benzonatate (TESSALON) 100 MG capsule Take 1 capsule (100 mg total) by mouth 3 (three) times daily as needed for cough. Do not take with alcohol or while driving or operating heavy machinery 04/25/22  Yes Noemi Chapel A, NP  fluticasone (FLONASE) 50 MCG/ACT nasal spray Place 1 spray into both nostrils daily. 04/25/22  Yes Eulogio Bear, NP  promethazine-dextromethorphan (PROMETHAZINE-DM) 6.25-15 MG/5ML syrup Take 5 mLs by mouth at bedtime as needed for cough. Do not take with alcohol or while driving or operating heavy machinery 04/25/22  Yes Eulogio Bear, NP  albuterol (PROVENTIL HFA) 108 (90 Base) MCG/ACT inhaler Inhale 2 puffs into the lungs every 6 (six) hours as needed for wheezing or shortness of breath. 08/20/18   Isaac Bliss, Rayford Halsted, MD  atorvastatin (LIPITOR) 20 MG tablet Take 1 tablet (20 mg total) by mouth daily. 12/05/21   Isaac Bliss, Rayford Halsted, MD  blood glucose meter kit and supplies KIT Dispense based on patient and insurance preference. Use up to four times daily as directed. 04/13/21   Isaac Bliss, Rayford Halsted, MD  budesonide-formoterol [facility]) 160-4.5 MCG/ACT inhaler Inhale 2 puffs then rinse mouth, twice daily 03/14/21   Baird Lyons D, MD  clonazePAM (KLONOPIN) 0.5 MG tablet 1 or 2 for sleep if needed 09/14/21   Baird Lyons D, MD  empagliflozin (JARDIANCE) 25 MG TABS tablet Take 1 tablet (25 mg total) by mouth daily. 04/09/22   Isaac Bliss, Rayford Halsted, MD  lisinopril (ZESTRIL) 10 MG tablet  Take 1 tablet by mouth once daily 11/28/21   Isaac Bliss, Rayford Halsted, MD  loperamide (IMODIUM) 2 MG capsule Take 4 mg by mouth as needed for diarrhea or loose stools.    [provider]  LORazepam (ATIVAN) 0.5 MG tablet TAKE 1 TABLET BY MOUTH EVERY 8 HOURS AS NEEDED FOR ANXIETY 10/11/21   Isaac Bliss, Rayford Halsted, MD  meloxicam [facility]) 15 MG tablet Take 1 tablet by mouth once daily 03/05/22   Isaac Bliss, Rayford Halsted, MD  metFORMIN (GLUCOPHAGE-XR) 500 MG 24 hr tablet TAKE 1 TABLET BY MOUTH IN THE MORNING AND 1 AT BEDTIME 11/28/21   Isaac Bliss, Rayford Halsted, MD  OVER THE COUNTER MEDICATION Take 1 capsule by mouth daily. Vitamin A / Zinc    [provider]  oxybutynin (DITROPAN XL) 15 MG 24 hr tablet Take 1 tablet by mouth once daily 03/06/22   Isaac Bliss, Rayford Halsted, MD  pantoprazole (PROTONIX) 40 MG tablet Take 1 tablet by mouth once daily 04/23/22   Isaac Bliss, Rayford Halsted,  MD  rifaximin (XIFAXAN) 550 MG TABS tablet Take 1 tablet (550 mg total) by mouth 3 (three) times daily. 11/21/21   Pyrtle, Lajuan Lines, MD  rOPINIRole (REQUIP) 0.25 MG tablet TAKE 1 TO 2 TABLETS BY MOUTH BEFORE BEDTIME FOR  RESTLESS  LEG Patient taking differently: Take 0.25-0.5 mg by mouth at bedtime as needed (RLS). TAKE 1 TO 2 TABLETS BY MOUTH BEFORE BEDTIME FOR  RESTLESS  LEG 03/01/20   Baird Lyons D, MD  sertraline (ZOLOFT) 100 MG tablet Take 1 tablet by mouth once daily 04/09/22   Isaac Bliss, Rayford Halsted, MD  venlafaxine [facility]) 75 MG tablet Take 1 tablet by mouth once daily 05/12/21   Isaac Bliss, Rayford Halsted, MD  vitamin B-12 (CYANOCOBALAMIN) 1000 MCG tablet Take 1,000 mcg by mouth daily.    [provider]    Family History Family History  Problem Relation Age of Onset   COPD Mother    Heart disease Mother    Heart disease Father    Heart attack Brother    Colon cancer Neg Hx    Stomach cancer Neg Hx    Esophageal cancer Neg Hx    Pancreatic cancer Neg Hx     Social  History Social History   Tobacco Use   Smoking status: Never   Smokeless tobacco: Never  Vaping Use   Vaping Use: Never used  Substance Use Topics   Alcohol use: No   Drug use: No     Allergies   Morphine and related   Review of Systems Review of Systems Per HPI  Physical Exam Triage Vital Signs ED Triage Vitals [04/25/22 1437]  Enc Vitals Group     BP 121/75     Pulse Rate (!) 105     Resp 16     Temp 99.1 F (37.3 C)     Temp Source Oral     SpO2 99 %     Weight      Height      Head Circumference      Peak Flow      Pain Score 3     Pain Loc      Pain Edu?      Excl. in [place]?    No data found.  Updated Vital Signs BP 121/75 (BP Location: Left Arm)   Pulse (!) 105   Temp 99.1 F (37.3 C) (Oral)   Resp 16   SpO2 99%   Visual Acuity Right Eye Distance:   Left Eye Distance:   Bilateral Distance:    Right Eye Near:   Left Eye Near:    Bilateral Near:     Physical Exam Vitals and nursing note reviewed.  Constitutional:      General: She is not in acute distress.    Appearance: Normal appearance. She is not ill-appearing or toxic-appearing.  HENT:     Head: Normocephalic and atraumatic.     Right Ear: Tympanic membrane, ear canal and external ear normal. No drainage, swelling or tenderness. No middle ear effusion. Tympanic membrane is not erythematous.     Left Ear: Ear canal and external ear normal. Tenderness present. A middle ear effusion is present. Tympanic membrane is not erythematous.     Nose: Rhinorrhea present. No congestion.     Right Sinus: Maxillary sinus tenderness and frontal sinus tenderness present.     Left Sinus: Maxillary sinus tenderness and frontal sinus tenderness present.     Mouth/Throat:     Mouth: Mucous  membranes are moist.     Pharynx: Oropharynx is clear. Posterior oropharyngeal erythema present. No oropharyngeal exudate.     Tonsils: 0 on the right. 0 on the left.     Comments: Cobblestoning of posterior  pharynx Eyes:     General: No scleral icterus.    Extraocular Movements: Extraocular movements intact.     Left eye: Normal extraocular motion.  Cardiovascular:     Rate and Rhythm: Normal rate and regular rhythm.  Pulmonary:     Effort: Pulmonary effort is normal. No respiratory distress.     Breath sounds: Normal breath sounds. No wheezing, rhonchi or rales.  Abdominal:     General: Abdomen is flat. Bowel sounds are normal. There is no distension.     Palpations: Abdomen is soft.  Musculoskeletal:     Cervical back: Normal range of motion and neck supple.  Lymphadenopathy:     Cervical: No cervical adenopathy.  Skin:    General: Skin is warm and dry.     Coloration: Skin is not jaundiced or pale.     Findings: No erythema or rash.  Neurological:     Mental Status: She is alert and oriented to person, place, and time.     Motor: No weakness.  Psychiatric:        Behavior: Behavior is cooperative.      UC Treatments / Results  Labs (all labs ordered are listed, but only abnormal results are displayed) Labs Reviewed - No data to display  EKG   Radiology No results found.  Procedures Procedures (including critical care time)  Medications Ordered in UC Medications - No data to display  Initial Impression / Assessment and Plan / UC Course  I have reviewed the triage vital signs and the nursing notes.  Pertinent labs & imaging results that were available during my care of the patient were reviewed by me and considered in my medical decision making (see chart for details).    Patient is a very pleasant, well-appearing 61 year old female presenting for left ear pain and sore throat.  History and exam are consistent with pansinusitis.  We will treat with Augmentin twice daily for 7 days.  Also encouraged supportive care with Mucinex 600 mg twice daily, nasal saline rinses.  Start Flonase to help with left ear effusion in addition to oral antihistamine.  Start cough  suppressants to help with dry cough.  Seek care if symptoms persist or worsen despite treatment.  The patient was given the opportunity to ask questions.  All questions answered to their satisfaction.  The patient is in agreement to this plan.   Final Clinical Impressions(s) / UC Diagnoses   Final diagnoses:  Acute non-recurrent pansinusitis  Acute effusion of left ear  Acute cough     Discharge Instructions      - Please Augmentin and take it twice daily for 7 days to treat the sinus infection -Also start Mucinex 600 mg twice daily and saline nasal rinses to help with the nasal congestion and stuffiness -Start Flonase nasal spray 1 spray each nostril daily to help with the fluid behind your ear and your allergy symptoms -You can take the cough Perles during the day as long as they do not make you sleepy to help suppress the cough.  At nighttime, you can use the cough syrup.  This will make you sleepy so do not take with other sedating medications or while driving or operating heavy machinery. -Follow-up here with your primary care provider  if your symptoms persist or worsen despite treatment   ED Prescriptions     Medication Sig Dispense Auth. Provider   amoxicillin-clavulanate (AUGMENTIN) 875-125 MG tablet Take 1 tablet by mouth 2 (two) times daily for 7 days. 14 tablet Noemi Chapel A, NP   fluticasone (FLONASE) 50 MCG/ACT nasal spray Place 1 spray into both nostrils daily. 16 g Noemi Chapel A, NP   benzonatate (TESSALON) 100 MG capsule Take 1 capsule (100 mg total) by mouth 3 (three) times daily as needed for cough. Do not take with alcohol or while driving or operating heavy machinery 21 capsule Noemi Chapel A, NP   promethazine-dextromethorphan (PROMETHAZINE-DM) 6.25-15 MG/5ML syrup Take 5 mLs by mouth at bedtime as needed for cough. Do not take with alcohol or while driving or operating heavy machinery 118 mL Eulogio Bear, NP      PDMP not reviewed this  encounter.   Eulogio Bear, NP 04/25/22 1555

## 2022-04-25 NOTE — Discharge Instructions (Addendum)
-   Please Augmentin and take it twice daily for 7 days to treat the sinus infection -Also start Mucinex 600 mg twice daily and saline nasal rinses to help with the nasal congestion and stuffiness -Start Flonase nasal spray 1 spray each nostril daily to help with the fluid behind your ear and your allergy symptoms -You can take the cough Perles during the day as long as they do not make you sleepy to help suppress the cough.  At nighttime, you can use the cough syrup.  This will make you sleepy so do not take with other sedating medications or while driving or operating heavy machinery. -Follow-up here with your primary care provider if your symptoms persist or worsen despite treatment

## 2022-04-25 NOTE — ED Triage Notes (Signed)
Pt present sore throat with troubling swallow and left ear pain. Symptoms for the throat started three days ago. Symptom for ear started in June.

## 2022-05-10 ENCOUNTER — Other Ambulatory Visit: Payer: Self-pay | Admitting: Internal Medicine

## 2022-05-14 ENCOUNTER — Other Ambulatory Visit: Payer: Self-pay | Admitting: Internal Medicine

## 2022-05-15 NOTE — Telephone Encounter (Signed)
Clonazepam refilled 

## 2022-05-15 NOTE — Telephone Encounter (Signed)
Please advise on med refill.  Allergies  Allergen Reactions   Morphine And Related Shortness Of Breath and Itching     Current Outpatient Medications:    albuterol (PROVENTIL HFA) 108 (90 Base) MCG/ACT inhaler, Inhale 2 puffs into the lungs every 6 (six) hours as needed for wheezing or shortness of breath., Disp: 18 g, Rfl: 3   atorvastatin (LIPITOR) 20 MG tablet, Take 1 tablet (20 mg total) by mouth daily., Disp: 90 tablet, Rfl: 1   benzonatate (TESSALON) 100 MG capsule, Take 1 capsule (100 mg total) by mouth 3 (three) times daily as needed for cough. Do not take with alcohol or while driving or operating heavy machinery, Disp: 21 capsule, Rfl: 0   blood glucose meter kit and supplies KIT, Dispense based on patient and insurance preference. Use up to four times daily as directed., Disp: 1 each, Rfl: 0   budesonide-formoterol (SYMBICORT) 160-4.5 MCG/ACT inhaler, Inhale 2 puffs then rinse mouth, twice daily, Disp: 1 each, Rfl: 6   clonazePAM (KLONOPIN) 0.5 MG tablet, 1 or 2 for sleep if needed, Disp: 60 tablet, Rfl: 5   empagliflozin (JARDIANCE) 25 MG TABS tablet, Take 1 tablet (25 mg total) by mouth daily., Disp: 90 tablet, Rfl: 1   fluticasone (FLONASE) 50 MCG/ACT nasal spray, Place 1 spray into both nostrils daily., Disp: 16 g, Rfl: 2   lisinopril (ZESTRIL) 10 MG tablet, Take 1 tablet by mouth once daily, Disp: 90 tablet, Rfl: 1   loperamide (IMODIUM) 2 MG capsule, Take 4 mg by mouth as needed for diarrhea or loose stools., Disp: , Rfl:    LORazepam (ATIVAN) 0.5 MG tablet, TAKE 1 TABLET BY MOUTH EVERY 8 HOURS AS NEEDED FOR ANXIETY, Disp: 60 tablet, Rfl: 0   meloxicam (MOBIC) 15 MG tablet, Take 1 tablet by mouth once daily, Disp: 90 tablet, Rfl: 1   metFORMIN (GLUCOPHAGE-XR) 500 MG 24 hr tablet, TAKE 1 TABLET BY MOUTH IN THE MORNING AND 1 AT BEDTIME, Disp: 180 tablet, Rfl: 1   OVER THE COUNTER MEDICATION, Take 1 capsule by mouth daily. Vitamin A / Zinc, Disp: , Rfl:    oxybutynin (DITROPAN  XL) 15 MG 24 hr tablet, Take 1 tablet by mouth once daily, Disp: 90 tablet, Rfl: 1   pantoprazole (PROTONIX) 40 MG tablet, Take 1 tablet by mouth once daily, Disp: 90 tablet, Rfl: 0   promethazine-dextromethorphan (PROMETHAZINE-DM) 6.25-15 MG/5ML syrup, Take 5 mLs by mouth at bedtime as needed for cough. Do not take with alcohol or while driving or operating heavy machinery, Disp: 118 mL, Rfl: 0   rifaximin (XIFAXAN) 550 MG TABS tablet, Take 1 tablet (550 mg total) by mouth 3 (three) times daily., Disp: 42 tablet, Rfl: 0   rOPINIRole (REQUIP) 0.25 MG tablet, TAKE 1 TO 2 TABLETS BY MOUTH BEFORE BEDTIME FOR  RESTLESS  LEG (Patient taking differently: Take 0.25-0.5 mg by mouth at bedtime as needed (RLS). TAKE 1 TO 2 TABLETS BY MOUTH BEFORE BEDTIME FOR  RESTLESS  LEG), Disp: 30 tablet, Rfl: 12   sertraline (ZOLOFT) 100 MG tablet, Take 1 tablet by mouth once daily, Disp: 90 tablet, Rfl: 1   venlafaxine (EFFEXOR) 75 MG tablet, Take 1 tablet by mouth once daily, Disp: 90 tablet, Rfl: 10   vitamin B-12 (CYANOCOBALAMIN) 1000 MCG tablet, Take 1,000 mcg by mouth daily., Disp: , Rfl:

## 2022-05-28 ENCOUNTER — Ambulatory Visit (INDEPENDENT_AMBULATORY_CARE_PROVIDER_SITE_OTHER): Payer: Medicare Other | Admitting: Internal Medicine

## 2022-05-28 VITALS — BP 118/80 | HR 68 | Temp 98.2°F | Ht 63.0 in | Wt 202.8 lb

## 2022-05-28 DIAGNOSIS — N898 Other specified noninflammatory disorders of vagina: Secondary | ICD-10-CM

## 2022-05-28 DIAGNOSIS — Z23 Encounter for immunization: Secondary | ICD-10-CM

## 2022-05-28 LAB — POCT URINALYSIS DIPSTICK
Bilirubin, UA: NEGATIVE
Blood, UA: NEGATIVE
Glucose, UA: POSITIVE — AB
Ketones, UA: NEGATIVE
Leukocytes, UA: NEGATIVE
Nitrite, UA: NEGATIVE
Protein, UA: NEGATIVE
Spec Grav, UA: 1.015 (ref 1.010–1.025)
Urobilinogen, UA: 0.2 E.U./dL
pH, UA: 6 (ref 5.0–8.0)

## 2022-05-28 MED ORDER — FLUCONAZOLE 100 MG PO TABS: 100.0000 mg | ORAL_TABLET | Freq: Every day | ORAL | 0 refills | Status: AC

## 2022-05-28 NOTE — Progress Notes (Signed)
Established Patient Office Visit     CC/Reason for Visit: "I think I have a UTI"  HPI: Kayla [name] is a 61 y.o. female who is coming in today for the above mentioned reasons.  On August 16 she was seen in urgent care and prescribed Augmentin for a suppose it sinus infection.  About 2 weeks after that she started experiencing sores on her vaginal area that have been very painful especially with urination.  She has noticed some clear vaginal discharge.  Her urine has become darker in color and has a "yucky smell".  She thinks she has a UTI.   Past Medical/Surgical History: Past Medical History:  Diagnosis Date   Allergy    Asthma    Carpal tunnel syndrome of right wrist 05/3817   Complication of anesthesia    hard to wake up after surg. 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[place])    "prothrombin gene factor II" (06/05/2012); states has had no problems   GERD (gastroesophageal reflux disease)    Headache(784.0)    due to allergies   Heart murmur    states no problems   Hyperlipidemia    Hypertension    Mitral valve prolapse    OSA on CPAP    Osteoarthritis    knees   Overactive bladder    PTSD (post-traumatic stress disorder)    Sleep apnea    Type II diabetes mellitus ([place])    NIDDM    Past Surgical History:  Procedure Laterality Date   ARTHRODESIS METATARSALPHALANGEAL JOINT (MTPJ) Right 08/24/2020   Procedure: ARTHRODESIS OF THE METATARSOPHALGEAL JOINT RIGHT FOOT;  Surgeon: Caprice Beaver, DPM;  Location: [facility];  Service: Podiatry;  Laterality: Right;   BREAST BIOPSY Left 04/25/2001   CARPAL TUNNEL RELEASE Right 10/24/2012   Procedure: CARPAL TUNNEL RELEASE;  Surgeon: Ninetta Lights, MD;  Location: [facility];  Service: Orthopedics;  Laterality: Right;  RIGHT CARPAL TUNNEL RELEASE   KNEE ARTHROSCOPY Right 04/01/2008   KNEE ARTHROSCOPY  W/ MENISCECTOMY Left 08/16/2011   partial medial   LASIK     bilateral   PUBOVAGINAL SLING  08/27/2001   cysto; suprapubic tube placement   SHOULDER ARTHROSCOPY W/ ROTATOR CUFF REPAIR Left 2004   TOTAL ABDOMINAL HYSTERECTOMY  08/27/2001   TOTAL KNEE ARTHROPLASTY  02/06/2012   Procedure: TOTAL KNEE ARTHROPLASTY;  Surgeon: Ninetta Lights, MD;  Location: [facility];  Service: Orthopedics;  Laterality: Right;  total knee right side   TOTAL KNEE ARTHROPLASTY  06/04/2012   Procedure: TOTAL KNEE ARTHROPLASTY;  Surgeon: Ninetta Lights, MD;  Location: [facility];  Service: Orthopedics;  Laterality: Left;   ULNAR COLLATERAL LIGAMENT REPAIR Left 11/08/2017   Procedure: REPAIR/RECONSTRUCTION LEFT THUMB ULNAR COLLATERAL LIGAMENT;  Surgeon: Daryll Brod, MD;  Location: [facility];  Service: Orthopedics;  Laterality: Left;   VAGINAL HYSTERECTOMY  1980's    Social History:  reports that she has never smoked. She has never used smokeless tobacco. She reports that she does not drink alcohol and does not use drugs.  Allergies: Allergies  Allergen Reactions   Morphine And Related Shortness Of Breath and Itching    Family History:  Family History  Problem Relation Age of Onset   COPD Mother    Heart disease Mother    Heart disease Father    Heart attack  Brother    Colon cancer Neg Hx    Stomach cancer Neg Hx    Esophageal cancer Neg Hx    Pancreatic cancer Neg Hx      Current Outpatient Medications:    albuterol (PROVENTIL HFA) 108 (90 Base) MCG/ACT inhaler, Inhale 2 puffs into the lungs every 6 (six) hours as needed for wheezing or shortness of breath., Disp: 18 g, Rfl: 3   atorvastatin (LIPITOR) 20 MG tablet, Take 1 tablet (20 mg total) by mouth daily., Disp: 90 tablet, Rfl: 1   benzonatate (TESSALON) 100 MG capsule, Take 1 capsule (100 mg total) by mouth 3 (three) times daily as needed for cough. Do not take with alcohol or while driving or operating heavy machinery, Disp: 21 capsule, Rfl:  0   blood glucose meter kit and supplies KIT, Dispense based on patient and insurance preference. Use up to four times daily as directed., Disp: 1 each, Rfl: 0   budesonide-formoterol (SYMBICORT) 160-4.5 MCG/ACT inhaler, Inhale 2 puffs then rinse mouth, twice daily, Disp: 1 each, Rfl: 6   clonazePAM (KLONOPIN) 0.5 MG tablet, TAKE 1 TO 2 TABLETS BY MOUTH AS NEEDED FOR SLEEP, Disp: 60 tablet, Rfl: 5   empagliflozin (JARDIANCE) 25 MG TABS tablet, Take 1 tablet (25 mg total) by mouth daily., Disp: 90 tablet, Rfl: 1   fluconazole (DIFLUCAN) 100 MG tablet, Take 1 tablet (100 mg total) by mouth daily for 3 days., Disp: 3 tablet, Rfl: 0   fluticasone (FLONASE) 50 MCG/ACT nasal spray, Place 1 spray into both nostrils daily., Disp: 16 g, Rfl: 2   lisinopril (ZESTRIL) 10 MG tablet, Take 1 tablet by mouth once daily, Disp: 90 tablet, Rfl: 1   loperamide (IMODIUM) 2 MG capsule, Take 4 mg by mouth as needed for diarrhea or loose stools., Disp: , Rfl:    LORazepam (ATIVAN) 0.5 MG tablet, TAKE 1 TABLET BY MOUTH EVERY 8 HOURS AS NEEDED FOR ANXIETY, Disp: 60 tablet, Rfl: 0   meloxicam (MOBIC) 15 MG tablet, Take 1 tablet by mouth once daily, Disp: 90 tablet, Rfl: 1   metFORMIN (GLUCOPHAGE-XR) 500 MG 24 hr tablet, TAKE 1 TABLET BY MOUTH IN THE MORNING AND 1 AT BEDTIME, Disp: 180 tablet, Rfl: 1   OVER THE COUNTER MEDICATION, Take 1 capsule by mouth daily. Vitamin A / Zinc, Disp: , Rfl:    oxybutynin (DITROPAN XL) 15 MG 24 hr tablet, Take 1 tablet by mouth once daily, Disp: 90 tablet, Rfl: 1   pantoprazole (PROTONIX) 40 MG tablet, Take 1 tablet by mouth once daily, Disp: 90 tablet, Rfl: 0   promethazine-dextromethorphan (PROMETHAZINE-DM) 6.25-15 MG/5ML syrup, Take 5 mLs by mouth at bedtime as needed for cough. Do not take with alcohol or while driving or operating heavy machinery, Disp: 118 mL, Rfl: 0   rifaximin (XIFAXAN) 550 MG TABS tablet, Take 1 tablet (550 mg total) by mouth 3 (three) times daily., Disp: 42 tablet,  Rfl: 0   rOPINIRole (REQUIP) 0.25 MG tablet, TAKE 1 TO 2 TABLETS BY MOUTH BEFORE BEDTIME FOR  RESTLESS  LEG (Patient taking differently: Take 0.25-0.5 mg by mouth at bedtime as needed (RLS). TAKE 1 TO 2 TABLETS BY MOUTH BEFORE BEDTIME FOR  RESTLESS  LEG), Disp: 30 tablet, Rfl: 12   sertraline (ZOLOFT) 100 MG tablet, Take 1 tablet by mouth once daily, Disp: 90 tablet, Rfl: 1   venlafaxine (EFFEXOR) 75 MG tablet, Take 1 tablet by mouth once daily, Disp: 90 tablet, Rfl: 10   vitamin B-12 (CYANOCOBALAMIN)  1000 MCG tablet, Take 1,000 mcg by mouth daily., Disp: , Rfl:   Review of Systems:  Constitutional: Denies fever, chills, diaphoresis, appetite change and fatigue.  HEENT: Denies photophobia, eye pain, redness, hearing loss, ear pain, congestion, sore throat, rhinorrhea, sneezing, mouth sores, trouble swallowing, neck pain, neck stiffness and tinnitus.   Respiratory: Denies SOB, DOE, cough, chest tightness,  and wheezing.   Cardiovascular: Denies chest pain, palpitations and leg swelling.  Gastrointestinal: Denies nausea, vomiting, abdominal pain, diarrhea, constipation, blood in stool and abdominal distention.  Genitourinary: Denies dysuria, urgency, frequency and difficulty urinating.  Endocrine: Denies: hot or cold intolerance, sweats, changes in hair or nails, polyuria, polydipsia. Musculoskeletal: Denies myalgias, back pain, joint swelling, arthralgias and gait problem.  Skin: Denies pallor, rash and wound.  Neurological: Denies dizziness, seizures, syncope, weakness, light-headedness, numbness and headaches.  Hematological: Denies adenopathy. Easy bruising, personal or family bleeding history  Psychiatric/Behavioral: Denies suicidal ideation, mood changes, confusion, nervousness, sleep disturbance and agitation    Physical Exam: Vitals:   05/28/22 1315  BP: 118/80  Pulse: 68  Temp: 98.2 F (36.8 C)  SpO2: 96%  Weight: 202 lb 12.8 oz (92 kg)  Height: $Remove'5\' 3"'GLNEgUV$  (1.6 m)    Body mass  index is 35.92 kg/m.   Constitutional: NAD, calm, comfortable Eyes: PERRL, lids and conjunctivae normal ENMT: Mucous membranes are moist. .  Psychiatric: Normal judgment and insight. Alert and oriented x 3. Normal mood.    Impression and Plan:  Vaginal itching - Plan: POCT Urinalysis Dipstick, Urine Culture, fluconazole (DIFLUCAN) 100 MG tablet  Need for immunization against influenza - Plan: Flu Vaccine QUAD 47mo+IM (Fluarix, Fluzone & Alfiuria Quad PF)  -Urine dipstick is negative, sent for culture. -With vaginal discharge, vulvar itching and recent exposure to antibiotics I think this is likely a yeast infection. -I will prescribe fluconazole 100 mg daily for 3 days. -Flu vaccine administered today.  Time spent:30 minutes reviewing chart, interviewing and examining patient and formulating plan of care.     Lelon Frohlich, MD [facility]

## 2022-05-30 ENCOUNTER — Other Ambulatory Visit: Payer: Self-pay | Admitting: Internal Medicine

## 2022-05-30 DIAGNOSIS — N898 Other specified noninflammatory disorders of vagina: Secondary | ICD-10-CM

## 2022-05-30 DIAGNOSIS — E119 Type 2 diabetes mellitus without complications: Secondary | ICD-10-CM

## 2022-05-31 ENCOUNTER — Other Ambulatory Visit: Payer: Self-pay | Admitting: Internal Medicine

## 2022-05-31 ENCOUNTER — Encounter: Payer: Self-pay | Admitting: Internal Medicine

## 2022-05-31 DIAGNOSIS — N390 Urinary tract infection, site not specified: Secondary | ICD-10-CM | POA: Insufficient documentation

## 2022-05-31 DIAGNOSIS — N3000 Acute cystitis without hematuria: Secondary | ICD-10-CM

## 2022-05-31 LAB — URINE CULTURE
MICRO NUMBER:: 13935174
SPECIMEN QUALITY:: ADEQUATE

## 2022-05-31 MED ORDER — SULFAMETHOXAZOLE-TRIMETHOPRIM 800-160 MG PO TABS: 1.0000 | ORAL_TABLET | Freq: Two times a day (BID) | ORAL | 0 refills | Status: AC

## 2022-06-03 ENCOUNTER — Other Ambulatory Visit: Payer: Self-pay | Admitting: Internal Medicine

## 2022-06-03 DIAGNOSIS — I10 Essential (primary) hypertension: Secondary | ICD-10-CM

## 2022-06-12 LAB — HM DIABETES EYE EXAM

## 2022-07-17 ENCOUNTER — Encounter: Payer: Medicare Other | Admitting: Internal Medicine

## 2022-07-19 ENCOUNTER — Other Ambulatory Visit: Payer: Self-pay | Admitting: Internal Medicine

## 2022-07-19 DIAGNOSIS — F339 Major depressive disorder, recurrent, unspecified: Secondary | ICD-10-CM

## 2022-08-08 ENCOUNTER — Other Ambulatory Visit: Payer: Self-pay | Admitting: Internal Medicine

## 2022-08-08 DIAGNOSIS — N951 Menopausal and female climacteric states: Secondary | ICD-10-CM

## 2022-08-27 ENCOUNTER — Other Ambulatory Visit: Payer: Self-pay | Admitting: Internal Medicine

## 2022-08-27 DIAGNOSIS — E119 Type 2 diabetes mellitus without complications: Secondary | ICD-10-CM

## 2022-09-03 ENCOUNTER — Other Ambulatory Visit: Payer: Self-pay | Admitting: Internal Medicine

## 2022-09-03 DIAGNOSIS — I10 Essential (primary) hypertension: Secondary | ICD-10-CM

## 2022-09-11 ENCOUNTER — Other Ambulatory Visit: Payer: Self-pay | Admitting: Internal Medicine

## 2022-09-11 DIAGNOSIS — E119 Type 2 diabetes mellitus without complications: Secondary | ICD-10-CM

## 2022-09-12 NOTE — Progress Notes (Signed)
Subjective:    Patient ID: Kayla [name], female    DOB: October 09, 1960, 62 y.o.   MRN: [id number]  HPI female never smoker followed for OSA/Insomnia, Asthma, allergic rhinitis complicated by HBP, depression, DM NPSG 01/30/11-AHI 19.9/hour, desaturation to 88%, CPAP titration to 9, body weight 230 pounds FENO 01/04/17- 42 H Office Spirometry 01/04/17-mild restriction of exhaled volume. FVC 2.51/78%, FEV1 2.06/82%, ratio 0.82, FEF25-75% 2.27/95% -------------------------------------------------------------------------------------------   09/13/21-62 yo female never smoker followed for OSA/Insomnia, Restless Legs, Asthma, Allergic Rhinitis,  complicated by HBP, depression, DM2, -Albuterol hfa, Symbicort 160, Requip 0.25, clonazepam 0.5 CPAP auto 5-15/ [employer]    AirSense 10 Autoset Download-compliance 67%, AHI 2.4/ hr Body weight today-198 lbs Covid vax-3 Moderna Flu vax-had -----Patient is doing good no concerns    ACT 25 Wakes with postnasal drip and cough at times.  Still has trouble initiating and maintaining sleep but much better with clonazepam 0.5 mg.  She is willing to try increasing that dosage.  Very comfortable with her CPAP-no concerns.  Download reviewed.  09/14/22- 62 yo female never smoker followed for OSA/Insomnia, Restless Legs, Asthma, Allergic Rhinitis,  complicated by HBP, depression, DM2, -Albuterol hfa, Symbicort 160, Requip 0.25, clonazepam 0.5 CPAP auto 5-15/ [employer]    AirSense 10 Autoset Download-compliance 97%, AHI 3.4 Body weight today-206 lbs Covid vax-3 Moderna Flu vax-had -----No issues with CPAP machine    ED for sinusitis in August Download reviewed.  Doing well with CPAP. Increased dyspnea on exertion with recent cold weather over the last 1 or 2 weeks.  Using Symbicort and occasionally using rescue inhaler.  ROS-see HPI            + = positive Constitutional:   No-   weight loss, night sweats, fevers, chills, fatigue, lassitude. HEENT:   No-  headaches,  difficulty swallowing, tooth/dental problems, sore throat,       No-  sneezing, itching, ear ache, +nasal congestion, post nasal drip,  CV:  No-   chest pain, orthopnea, PND, swelling in lower extremities, anasarca,  dizziness, palpitations Resp:  No hemoptysis  Skin: No-   rash or lesions. GI:  No-   heartburn, indigestion, abdominal pain, nausea, vomiting,  GU: . MS:  No-   joint pain or swelling.  . Neuro-     nothing unusual Psych:  No- change in mood or affect. No depression or anxiety.  No memory loss.  OBJ- Physical Exam General- Alert, Oriented, Affect-appropriate, Distress- none acute, + overweight Skin- rash-none, lesions- none, excoriation- none Lymphadenopathy- none Head- atraumatic            Eyes- Gross vision intact, PERRLA, conjunctivae and secretions clear            Ears- Hearing, canals-normal            Nose- Clear, no-Septal dev, mucus, polyps, erosion, perforation             Throat- Mallampati III , mucosa clear , drainage- none, tonsils- atrophic Neck- flexible , trachea midline, no stridor , thyroid nl, carotid no bruit Chest - symmetrical excursion , unlabored           Heart/CV- RRR , no murmur , no gallop  , no rub, nl s1 s2                           - JVD- none , edema- none, stasis changes- none, varices- none  Lung- clear to P&A, wheeze- none, cough- none , dullness-none, rub- none           Chest wall-  Abd-  Br/ Gen/ Rectal- Not done, not indicated Extrem- cyanosis- none, clubbing, none, atrophy- none, strength- nl Neuro- grossly intact to observation       

## 2022-09-14 ENCOUNTER — Encounter: Payer: Self-pay | Admitting: Internal Medicine

## 2022-09-14 ENCOUNTER — Ambulatory Visit: Payer: Medicare Other | Admitting: Internal Medicine

## 2022-09-14 VITALS — BP 112/68 | HR 87 | Ht 63.0 in | Wt 206.4 lb

## 2022-09-14 DIAGNOSIS — J4520 Mild intermittent asthma, uncomplicated: Secondary | ICD-10-CM | POA: Diagnosis not present

## 2022-09-14 DIAGNOSIS — G4733 Obstructive sleep apnea (adult) (pediatric): Secondary | ICD-10-CM

## 2022-09-14 NOTE — Patient Instructions (Signed)
Order- DME [employer]- please replace old CPAP machine if eligible. Auto 5-15, mask of choice, supplies, humidifier, AirView/ card  Try using your albuterol rescue inhaler before you exert- such as walking your dog. See if it helps.

## 2022-09-26 ENCOUNTER — Encounter: Payer: Self-pay | Admitting: Internal Medicine

## 2022-09-26 ENCOUNTER — Ambulatory Visit (INDEPENDENT_AMBULATORY_CARE_PROVIDER_SITE_OTHER): Payer: Medicare Other | Admitting: Internal Medicine

## 2022-09-26 VITALS — BP 120/80 | HR 80 | Temp 99.0°F | Ht 62.5 in | Wt 197.5 lb

## 2022-09-26 DIAGNOSIS — E119 Type 2 diabetes mellitus without complications: Secondary | ICD-10-CM | POA: Diagnosis not present

## 2022-09-26 DIAGNOSIS — E559 Vitamin D deficiency, unspecified: Secondary | ICD-10-CM

## 2022-09-26 DIAGNOSIS — Z1231 Encounter for screening mammogram for malignant neoplasm of breast: Secondary | ICD-10-CM

## 2022-09-26 DIAGNOSIS — Z0000 Encounter for general adult medical examination without abnormal findings: Secondary | ICD-10-CM

## 2022-09-26 DIAGNOSIS — I10 Essential (primary) hypertension: Secondary | ICD-10-CM

## 2022-09-26 DIAGNOSIS — E7800 Pure hypercholesterolemia, unspecified: Secondary | ICD-10-CM | POA: Diagnosis not present

## 2022-09-26 LAB — COMPREHENSIVE METABOLIC PANEL
ALT: 17 U/L (ref 0–35)
AST: 19 U/L (ref 0–37)
Albumin: 4.4 g/dL (ref 3.5–5.2)
Alkaline Phosphatase: 92 U/L (ref 39–117)
BUN: 13 mg/dL (ref 6–23)
CO2: 30 mEq/L (ref 19–32)
Calcium: 9.5 mg/dL (ref 8.4–10.5)
Chloride: 98 mEq/L (ref 96–112)
Creatinine, Ser: 1.04 mg/dL (ref 0.40–1.20)
GFR: 57.1 mL/min — ABNORMAL LOW (ref >60.00)
Glucose, Bld: 116 mg/dL — ABNORMAL HIGH (ref 70–99)
Potassium: 4 mEq/L (ref 3.5–5.1)
Sodium: 135 mEq/L (ref 135–145)
Total Bilirubin: 0.6 mg/dL (ref 0.2–1.2)
Total Protein: 7.2 g/dL (ref 6.0–8.3)

## 2022-09-26 LAB — CBC WITH DIFFERENTIAL/PLATELET
Basophils Absolute: 0 10*3/uL (ref 0.0–0.1)
Basophils Relative: 0.5 % (ref 0.0–3.0)
Eosinophils Absolute: 0.2 10*3/uL (ref 0.0–0.7)
Eosinophils Relative: 2.6 % (ref 0.0–5.0)
HCT: 44.6 % (ref 36.0–46.0)
Hemoglobin: 15.2 g/dL — ABNORMAL HIGH (ref 12.0–15.0)
Lymphocytes Relative: 40.3 % (ref 12.0–46.0)
Lymphs Abs: 2.9 10*3/uL (ref 0.7–4.0)
MCHC: 34.2 g/dL (ref 30.0–36.0)
MCV: 84.6 fl (ref 78.0–100.0)
Monocytes Absolute: 0.9 10*3/uL (ref 0.1–1.0)
Monocytes Relative: 12.4 % — ABNORMAL HIGH (ref 3.0–12.0)
Neutro Abs: 3.2 10*3/uL (ref 1.4–7.7)
Neutrophils Relative %: 44.2 % (ref 43.0–77.0)
Platelets: 226 10*3/uL (ref 150.0–400.0)
RBC: 5.28 Mil/uL — ABNORMAL HIGH (ref 3.87–5.11)
RDW: 14 % (ref 11.5–15.5)
WBC: 7.2 10*3/uL (ref 4.0–10.5)

## 2022-09-26 LAB — TSH: TSH: 4.37 u[IU]/mL (ref 0.35–5.50)

## 2022-09-26 LAB — MICROALBUMIN / CREATININE URINE RATIO
Creatinine,U: 89.8 mg/dL
Microalb Creat Ratio: 0.8 mg/g (ref 0.0–30.0)
Microalb, Ur: <0.7 mg/dL (ref 0.0–1.9)

## 2022-09-26 LAB — LIPID PANEL
Cholesterol: 140 mg/dL (ref 0–200)
HDL: 42.5 mg/dL (ref >39.00)
LDL Cholesterol: 72 mg/dL (ref 0–99)
NonHDL: 97.13
Total CHOL/HDL Ratio: 3
Triglycerides: 124 mg/dL (ref 0.0–149.0)
VLDL: 24.8 mg/dL (ref 0.0–40.0)

## 2022-09-26 LAB — VITAMIN B12: Vitamin B-12: 596 pg/mL (ref 211–911)

## 2022-09-26 LAB — HEMOGLOBIN A1C: Hgb A1c MFr Bld: 7.1 % — ABNORMAL HIGH (ref 4.6–6.5)

## 2022-09-26 LAB — VITAMIN D 25 HYDROXY (VIT D DEFICIENCY, FRACTURES): VITD: 24.93 ng/mL — ABNORMAL LOW (ref 30.00–100.00)

## 2022-09-26 NOTE — Addendum Note (Signed)
Addended by: Westley Hummer B on: 09/26/2022 09:50 AM   Modules accepted: Orders

## 2022-09-26 NOTE — Progress Notes (Signed)
Established Patient Office Visit     CC/Reason for Visit: Annual preventive exam and subsequent Medicare wellness visit  HPI: Kayla [name] is a 62 y.o. female who is coming in today for the above mentioned reasons. Past Medical History is significant for: Hypertension, hyperlipidemia, type 2 diabetes, OSA, vitamin D deficiency, GERD, morbid obesity.  She has routine eye and dental care, no perceived hearing difficulty, she is overdue for RSV and COVID vaccines.  She is overdue for mammogram.  She states she has had her GYN exam and diabetic eye exam updated, will obtain records.   Past Medical/Surgical History: Past Medical History:  Diagnosis Date   Allergy    Asthma    Carpal tunnel syndrome of right wrist 57/8469   Complication of anesthesia    hard to wake up after surg. 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[place])    "prothrombin gene factor II" (06/05/2012); states has had no problems   GERD (gastroesophageal reflux disease)    Headache(784.0)    due to allergies   Heart murmur    states no problems   Hyperlipidemia    Hypertension    Mitral valve prolapse    OSA on CPAP    Osteoarthritis    knees   Overactive bladder    PTSD (post-traumatic stress disorder)    Sleep apnea    Type II diabetes mellitus ([place])    NIDDM    Past Surgical History:  Procedure Laterality Date   ARTHRODESIS METATARSALPHALANGEAL JOINT (MTPJ) Right 08/24/2020   Procedure: ARTHRODESIS OF THE METATARSOPHALGEAL JOINT RIGHT FOOT;  Surgeon: Caprice Beaver, DPM;  Location: [facility];  Service: Podiatry;  Laterality: Right;   BREAST BIOPSY Left 04/25/2001   CARPAL TUNNEL RELEASE Right 10/24/2012   Procedure: CARPAL TUNNEL RELEASE;  Surgeon: Ninetta Lights, MD;  Location: [facility];  Service: Orthopedics;  Laterality: Right;  RIGHT CARPAL TUNNEL RELEASE   KNEE  ARTHROSCOPY Right 04/01/2008   KNEE ARTHROSCOPY W/ MENISCECTOMY Left 08/16/2011   partial medial   LASIK     bilateral   PUBOVAGINAL SLING  08/27/2001   cysto; suprapubic tube placement   SHOULDER ARTHROSCOPY W/ ROTATOR CUFF REPAIR Left 2004   TOTAL ABDOMINAL HYSTERECTOMY  08/27/2001   TOTAL KNEE ARTHROPLASTY  02/06/2012   Procedure: TOTAL KNEE ARTHROPLASTY;  Surgeon: Ninetta Lights, MD;  Location: [facility];  Service: Orthopedics;  Laterality: Right;  total knee right side   TOTAL KNEE ARTHROPLASTY  06/04/2012   Procedure: TOTAL KNEE ARTHROPLASTY;  Surgeon: Ninetta Lights, MD;  Location: [facility];  Service: Orthopedics;  Laterality: Left;   ULNAR COLLATERAL LIGAMENT REPAIR Left 11/08/2017   Procedure: REPAIR/RECONSTRUCTION LEFT THUMB ULNAR COLLATERAL LIGAMENT;  Surgeon: Daryll Brod, MD;  Location: [facility];  Service: Orthopedics;  Laterality: Left;   VAGINAL HYSTERECTOMY  1980's    Social History:  reports that she has never smoked. She has never used smokeless tobacco. She reports that she does not drink alcohol and does not use drugs.  Allergies: Allergies  Allergen Reactions   Morphine And Related Shortness Of Breath and Itching   Ibuprofen Itching    Family History:  Family History  Problem Relation Age of Onset   COPD Mother    Heart disease Mother    Heart disease Father    Heart attack Brother  Colon cancer Neg Hx    Stomach cancer Neg Hx    Esophageal cancer Neg Hx    Pancreatic cancer Neg Hx      Current Outpatient Medications:    albuterol (PROVENTIL HFA) 108 (90 Base) MCG/ACT inhaler, Inhale 2 puffs into the lungs every 6 (six) hours as needed for wheezing or shortness of breath., Disp: 18 g, Rfl: 3   atorvastatin (LIPITOR) 20 MG tablet, Take 1 tablet (20 mg total) by mouth daily., Disp: 90 tablet, Rfl: 1   blood glucose meter kit and supplies KIT, Dispense based on patient and insurance preference. Use up to four times daily as directed., Disp: 1  each, Rfl: 0   budesonide-formoterol (SYMBICORT) 160-4.5 MCG/ACT inhaler, Inhale 2 puffs then rinse mouth, twice daily, Disp: 1 each, Rfl: 6   clonazePAM (KLONOPIN) 0.5 MG tablet, TAKE 1 TO 2 TABLETS BY MOUTH AS NEEDED FOR SLEEP, Disp: 60 tablet, Rfl: 5   empagliflozin (JARDIANCE) 25 MG TABS tablet, Take 1 tablet (25 mg total) by mouth daily., Disp: 90 tablet, Rfl: 1   fluticasone (FLONASE) 50 MCG/ACT nasal spray, Place 1 spray into both nostrils daily., Disp: 16 g, Rfl: 2   lisinopril (ZESTRIL) 10 MG tablet, TAKE 1 TABLET BY MOUTH ONCE DAILY . APPOINTMENT REQUIRED FOR FUTURE REFILLS, Disp: 90 tablet, Rfl: 0   loperamide (IMODIUM) 2 MG capsule, Take 4 mg by mouth as needed for diarrhea or loose stools., Disp: , Rfl:    LORazepam (ATIVAN) 0.5 MG tablet, TAKE 1 TABLET BY MOUTH EVERY 8 HOURS AS NEEDED FOR ANXIETY, Disp: 60 tablet, Rfl: 0   meloxicam (MOBIC) 15 MG tablet, Take 1 tablet by mouth once daily, Disp: 90 tablet, Rfl: 0   metFORMIN (GLUCOPHAGE-XR) 500 MG 24 hr tablet, TAKE 1 TABLET BY MOUTH IN THE MORNING AND 1  TABLET AT BEDTIME, Disp: 180 tablet, Rfl: 0   OVER THE COUNTER MEDICATION, Take 1 capsule by mouth daily. Vitamin A / Zinc, Disp: , Rfl:    oxybutynin (DITROPAN XL) 15 MG 24 hr tablet, Take 1 tablet by mouth once daily, Disp: 90 tablet, Rfl: 1   pantoprazole (PROTONIX) 40 MG tablet, Take 1 tablet by mouth once daily, Disp: 90 tablet, Rfl: 0   rifaximin (XIFAXAN) 550 MG TABS tablet, Take 1 tablet (550 mg total) by mouth 3 (three) times daily., Disp: 42 tablet, Rfl: 0   rOPINIRole (REQUIP) 0.25 MG tablet, TAKE 1 TO 2 TABLETS BY MOUTH BEFORE BEDTIME FOR  RESTLESS  LEG (Patient taking differently: Take 0.25-0.5 mg by mouth at bedtime as needed (RLS). TAKE 1 TO 2 TABLETS BY MOUTH BEFORE BEDTIME FOR  RESTLESS  LEG), Disp: 30 tablet, Rfl: 12   sertraline (ZOLOFT) 100 MG tablet, Take 1 tablet by mouth once daily, Disp: 90 tablet, Rfl: 1   venlafaxine (EFFEXOR) 75 MG tablet, Take 1 tablet by  mouth once daily, Disp: 90 tablet, Rfl: 0   vitamin B-12 (CYANOCOBALAMIN) 1000 MCG tablet, Take 1,000 mcg by mouth daily., Disp: , Rfl:   Review of Systems:  Negative unless indicated in HPI.   Physical Exam: Vitals:   09/26/22 0859  BP: 120/80  Pulse: 80  Temp: 99 F (37.2 C)  TempSrc: Oral  SpO2: 97%  Weight: 197 lb 8 oz (89.6 kg)  Height: 5' 2.5" (1.588 m)    Body mass index is 35.55 kg/m.   Physical Exam Vitals reviewed.  Constitutional:      General: She is not in acute distress.  Appearance: Normal appearance. She is not ill-appearing, toxic-appearing or diaphoretic.  HENT:     Head: Normocephalic.     Right Ear: Tympanic membrane, ear canal and external ear normal. There is no impacted cerumen.     Left Ear: Tympanic membrane, ear canal and external ear normal. There is no impacted cerumen.     Nose: Nose normal.     Mouth/Throat:     Mouth: Mucous membranes are moist.     Pharynx: Oropharynx is clear. No oropharyngeal exudate or posterior oropharyngeal erythema.  Eyes:     General: No scleral icterus.       Right eye: No discharge.        Left eye: No discharge.     Conjunctiva/sclera: Conjunctivae normal.     Pupils: Pupils are equal, round, and reactive to light.  Neck:     Vascular: No carotid bruit.  Cardiovascular:     Rate and Rhythm: Normal rate and regular rhythm.     Pulses: Normal pulses.     Heart sounds: Normal heart sounds.  Pulmonary:     Effort: Pulmonary effort is normal. No respiratory distress.     Breath sounds: Normal breath sounds.  Abdominal:     General: Abdomen is flat. Bowel sounds are normal.     Palpations: Abdomen is soft.  Musculoskeletal:        General: Normal range of motion.     Cervical back: Normal range of motion.  Skin:    General: Skin is warm and dry.     Capillary Refill: Capillary refill takes less than 2 seconds.  Neurological:     General: No focal deficit present.     Mental Status: She is alert and  oriented to person, place, and time. Mental status is at baseline.  Psychiatric:        Mood and Affect: Mood normal.        Behavior: Behavior normal.        Thought Content: Thought content normal.        Judgment: Judgment normal.      Subsequent Medicare wellness visit   1. Risk factors, based on past  M,S,F -cardiovascular disease risk factors include age, obesity, hypertension, hyperlipidemia, diabetes   2.  Physical activities: Sedentary other than mild walking occasionally   3.  Depression/mood: Stable, not depressed   4.  Hearing: No perceived issues   5.  ADL's: Independent in all ADLs   6.  Fall risk: Low fall risk   7.  Home safety: No problems identified   8.  Height weight, and visual acuity: height and weight as above, vision:  20/20 with both eyes and together   9.  Counseling: Advised to update all age-appropriate immunizations and cancer screenings   10. Lab orders based on risk factors: Laboratory update will be reviewed   11. Referral : Mammogram   12. Care plan: Follow-up in 3 months   13. Cognitive assessment: No cognitive impairment   14. Screening: Patient provided with a written and personalized 5-10 year screening schedule in the AVS. yes   15. Provider List Update: PCP only  16. Advance Directives: Full code   17. Opioids: Patient is not on any opioid prescriptions and has no risk factors for a substance use disorder.   [facility] Office Visit from 09/26/2022 in [facility] [place]  PHQ-9 Total Score 3          08/24/2021    1:21 PM 04/25/2022  2:38 PM 05/28/2022    1:19 PM 09/26/2022    9:09 AM 09/26/2022    9:11 AM  Fall Risk  Falls in the past year?   0 0 0  Was there an injury with Fall?   0 0 0  Fall Risk Category Calculator   0 0 0  Fall Risk Category (Retired)   Low    (RETIRED) Patient Fall Risk Level Low fall risk Low fall risk Low fall risk    Patient at Risk for Falls Due to   No Fall Risks No Fall  Risks No Fall Risks  Fall risk Follow up   Falls evaluation completed Falls evaluation completed Falls evaluation completed     Impression and Plan:  Encounter for preventive health examination  Diabetes mellitus with coincident hypertension ([place]) - Plan: Urine microalbumin-creatinine with uACR, CBC with Differential/Platelet, Comprehensive metabolic panel, Hemoglobin A1c, Hemoglobin A1c, Comprehensive metabolic panel, CBC with Differential/Platelet  Pure hypercholesterolemia - Plan: Lipid panel, Lipid panel  Morbid obesity ([place]) - Plan: TSH, Vitamin B12, Vitamin B12, TSH  Vitamin D deficiency - Plan: VITAMIN D 25 Hydroxy (Vit-D Deficiency, Fractures), VITAMIN D 25 Hydroxy (Vit-D Deficiency, Fractures)  HYPERTENSION, BENIGN ESSENTIAL  -Recommend routine eye and dental care. -Immunizations: Advised to update COVID and RSV at pharmacy, all other immunizations are up-to-date -Healthy lifestyle discussed in detail. -Labs to be updated today. -Colon cancer screening: 11/2021 -Breast cancer screening: Overdue, referral placed -Cervical cancer screening: Obtain records from GYN -Lung cancer screening: Not applicable -Prostate cancer screening: Not applicable -DEXA: Not applicable      Toyna Erisman Isaac Bliss, MD [facility]

## 2022-09-27 ENCOUNTER — Other Ambulatory Visit: Payer: Self-pay | Admitting: Internal Medicine

## 2022-09-27 DIAGNOSIS — E559 Vitamin D deficiency, unspecified: Secondary | ICD-10-CM

## 2022-09-27 MED ORDER — VITAMIN D (ERGOCALCIFEROL) 1.25 MG (50000 UNIT) PO CAPS: 50000.0000 [IU] | ORAL_CAPSULE | ORAL | 0 refills | Status: AC

## 2022-10-05 ENCOUNTER — Encounter: Payer: Self-pay | Admitting: Internal Medicine

## 2022-10-05 NOTE — Assessment & Plan Note (Signed)
Transfer from CPAP with good compliance and control Plan-continue auto 5-15

## 2022-10-05 NOTE — Assessment & Plan Note (Signed)
More dyspnea on exertion and cold weather.  We discussed use of inhalers. Plan-try using rescue inhaler shortly before going outdoors or before exertion.  We can make other adjustments if needed.

## 2022-10-24 ENCOUNTER — Other Ambulatory Visit: Payer: Self-pay | Admitting: Internal Medicine

## 2022-11-03 ENCOUNTER — Other Ambulatory Visit: Payer: Self-pay | Admitting: Internal Medicine

## 2022-11-03 DIAGNOSIS — N951 Menopausal and female climacteric states: Secondary | ICD-10-CM

## 2022-11-03 DIAGNOSIS — E119 Type 2 diabetes mellitus without complications: Secondary | ICD-10-CM

## 2022-11-06 DIAGNOSIS — M25552 Pain in left hip: Secondary | ICD-10-CM | POA: Diagnosis not present

## 2022-11-06 DIAGNOSIS — M5416 Radiculopathy, lumbar region: Secondary | ICD-10-CM | POA: Diagnosis not present

## 2022-11-07 DIAGNOSIS — M25552 Pain in left hip: Secondary | ICD-10-CM | POA: Diagnosis not present

## 2022-11-25 ENCOUNTER — Other Ambulatory Visit: Payer: Self-pay | Admitting: Internal Medicine

## 2022-11-25 DIAGNOSIS — I10 Essential (primary) hypertension: Secondary | ICD-10-CM

## 2022-11-25 DIAGNOSIS — E119 Type 2 diabetes mellitus without complications: Secondary | ICD-10-CM

## 2022-11-26 DIAGNOSIS — M5416 Radiculopathy, lumbar region: Secondary | ICD-10-CM | POA: Diagnosis not present

## 2022-12-17 ENCOUNTER — Other Ambulatory Visit: Payer: Self-pay | Admitting: *Deleted

## 2022-12-17 DIAGNOSIS — E7800 Pure hypercholesterolemia, unspecified: Secondary | ICD-10-CM

## 2022-12-17 DIAGNOSIS — Z1231 Encounter for screening mammogram for malignant neoplasm of breast: Secondary | ICD-10-CM | POA: Diagnosis not present

## 2022-12-17 LAB — HM MAMMOGRAPHY

## 2022-12-17 MED ORDER — ATORVASTATIN CALCIUM 20 MG PO TABS: 20.0000 mg | ORAL_TABLET | Freq: Every day | ORAL | 1 refills | Status: DC

## 2022-12-26 ENCOUNTER — Encounter: Payer: Self-pay | Admitting: Internal Medicine

## 2022-12-26 ENCOUNTER — Ambulatory Visit (INDEPENDENT_AMBULATORY_CARE_PROVIDER_SITE_OTHER): Payer: Medicare Other | Admitting: Internal Medicine

## 2022-12-26 VITALS — BP 129/75 | HR 73 | Temp 98.2°F | Wt 204.7 lb

## 2022-12-26 DIAGNOSIS — I10 Essential (primary) hypertension: Secondary | ICD-10-CM

## 2022-12-26 DIAGNOSIS — E559 Vitamin D deficiency, unspecified: Secondary | ICD-10-CM | POA: Diagnosis not present

## 2022-12-26 DIAGNOSIS — E119 Type 2 diabetes mellitus without complications: Secondary | ICD-10-CM

## 2022-12-26 DIAGNOSIS — E7800 Pure hypercholesterolemia, unspecified: Secondary | ICD-10-CM

## 2022-12-26 LAB — HEMOGLOBIN A1C: Hgb A1c MFr Bld: 7 % — ABNORMAL HIGH (ref 4.6–6.5)

## 2022-12-26 LAB — VITAMIN D 25 HYDROXY (VIT D DEFICIENCY, FRACTURES): VITD: 46.97 ng/mL (ref 30.00–100.00)

## 2022-12-26 NOTE — Progress Notes (Signed)
Established Patient Office Visit     CC/Reason for Visit: Follow-up chronic conditions  HPI: Kayla [name] is a 62 y.o. female who is coming in today for the above mentioned reasons. Past Medical History is significant for: Hypertension, hyperlipidemia, type 2 diabetes, obstructive sleep apnea, vitamin D deficiency, GERD, morbid obesity.  She is feeling well and has no acute concerns or complaints.   Past Medical/Surgical History: Past Medical History:  Diagnosis Date   Allergy    Asthma    Carpal tunnel syndrome of right wrist 10/2012   Complication of anesthesia    hard to wake up after surg. 06/04/2012 and CO2 went up to 78   Depressive disorder, not elsewhere classified    Exertional dyspnea    occasionally   Factor II deficiency    "prothrombin gene factor II" (06/05/2012); states has had no problems   GERD (gastroesophageal reflux disease)    Headache(784.0)    due to allergies   Heart murmur    states no problems   Hyperlipidemia    Hypertension    Mitral valve prolapse    OSA on CPAP    Osteoarthritis    knees   Overactive bladder    PTSD (post-traumatic stress disorder)    Sleep apnea    Type II diabetes mellitus    NIDDM    Past Surgical History:  Procedure Laterality Date   ARTHRODESIS METATARSALPHALANGEAL JOINT (MTPJ) Right 08/24/2020   Procedure: ARTHRODESIS OF THE METATARSOPHALGEAL JOINT RIGHT FOOT;  Surgeon: Ferman Hamming, DPM;  Location: [facility];  Service: Podiatry;  Laterality: Right;   BREAST BIOPSY Left 04/25/2001   CARPAL TUNNEL RELEASE Right 10/24/2012   Procedure: CARPAL TUNNEL RELEASE;  Surgeon: Loreta Ave, MD;  Location: [facility];  Service: Orthopedics;  Laterality: Right;  RIGHT CARPAL TUNNEL RELEASE   KNEE ARTHROSCOPY Right 04/01/2008   KNEE ARTHROSCOPY W/ MENISCECTOMY Left 08/16/2011   partial medial   LASIK     bilateral   PUBOVAGINAL SLING  08/27/2001   cysto; suprapubic tube placement   SHOULDER  ARTHROSCOPY W/ ROTATOR CUFF REPAIR Left 2004   TOTAL ABDOMINAL HYSTERECTOMY  08/27/2001   TOTAL KNEE ARTHROPLASTY  02/06/2012   Procedure: TOTAL KNEE ARTHROPLASTY;  Surgeon: Loreta Ave, MD;  [facility];  Service: Orthopedics;  Laterality: Right;  total knee right side   TOTAL KNEE ARTHROPLASTY  06/04/2012   Procedure: TOTAL KNEE ARTHROPLASTY;  Surgeon: Loreta Ave, MD;  Location: [facility];  Service: Orthopedics;  Laterality: Left;   ULNAR COLLATERAL LIGAMENT REPAIR Left 11/08/2017   Procedure: REPAIR/RECONSTRUCTION LEFT THUMB ULNAR COLLATERAL LIGAMENT;  Surgeon: Cindee Salt, MD;  Location: [facility];  Service: Orthopedics;  Laterality: Left;   VAGINAL HYSTERECTOMY  1980's    Social History:  reports that she has never smoked. She has never used smokeless tobacco. She reports that she does not drink alcohol and does not use drugs.  Allergies: Allergies  Allergen Reactions   Morphine And Related Shortness Of Breath and Itching   Ibuprofen Itching    Family History:  Family History  Problem Relation Age of Onset   COPD Mother    Heart disease Mother    Heart disease Father    Heart attack Brother    Colon cancer Neg Hx    Stomach cancer Neg Hx    Esophageal cancer Neg Hx    Pancreatic cancer Neg Hx      Current Outpatient Medications:  albuterol (PROVENTIL HFA) 108 (90 Base) MCG/ACT inhaler, Inhale 2 puffs into the lungs every 6 (six) hours as needed for wheezing or shortness of breath., Disp: 18 g, Rfl: 3   atorvastatin (LIPITOR) 20 MG tablet, Take 1 tablet (20 mg total) by mouth daily., Disp: 100 tablet, Rfl: 1   blood glucose meter kit and supplies KIT, Dispense based on patient and insurance preference. Use up to four times daily as directed., Disp: 1 each, Rfl: 0   budesonide-formoterol (SYMBICORT) 160-4.5 MCG/ACT inhaler, Inhale 2 puffs then rinse mouth, twice daily, Disp: 1 each, Rfl: 6   clonazePAM (KLONOPIN) 0.5 MG tablet, TAKE 1 TO 2 TABLETS BY  MOUTH AS NEEDED FOR SLEEP, Disp: 60 tablet, Rfl: 5   fluticasone (FLONASE) 50 MCG/ACT nasal spray, Place 1 spray into both nostrils daily., Disp: 16 g, Rfl: 2   JARDIANCE 25 MG TABS tablet, Take 1 tablet by mouth once daily, Disp: 90 tablet, Rfl: 0   lisinopril (ZESTRIL) 10 MG tablet, TAKE 1 TABLET BY MOUTH ONCE DAILY . APPOINTMENT REQUIRED FOR FUTURE REFILLS, Disp: 90 tablet, Rfl: 1   loperamide (IMODIUM) 2 MG capsule, Take 4 mg by mouth as needed for diarrhea or loose stools., Disp: , Rfl:    LORazepam (ATIVAN) 0.5 MG tablet, TAKE 1 TABLET BY MOUTH EVERY 8 HOURS AS NEEDED FOR ANXIETY, Disp: 60 tablet, Rfl: 0   meloxicam (MOBIC) 15 MG tablet, Take 1 tablet by mouth once daily, Disp: 90 tablet, Rfl: 0   metFORMIN (GLUCOPHAGE-XR) 500 MG 24 hr tablet, TAKE 1 TABLET BY MOUTH ONCE DAILY IN THE MORNING AND 1 AT BEDTIME, Disp: 180 tablet, Rfl: 1   MYRBETRIQ 50 MG TB24 tablet, Take 50 mg by mouth daily., Disp: , Rfl:    OVER THE COUNTER MEDICATION, Take 1 capsule by mouth daily. Vitamin A / Zinc, Disp: , Rfl:    pantoprazole (PROTONIX) 40 MG tablet, Take 1 tablet by mouth once daily, Disp: 90 tablet, Rfl: 1   rifaximin (XIFAXAN) 550 MG TABS tablet, Take 1 tablet (550 mg total) by mouth 3 (three) times daily., Disp: 42 tablet, Rfl: 0   rOPINIRole (REQUIP) 0.25 MG tablet, TAKE 1 TO 2 TABLETS BY MOUTH BEFORE BEDTIME FOR  RESTLESS  LEG (Patient taking differently: Take 0.25-0.5 mg by mouth at bedtime as needed (RLS). TAKE 1 TO 2 TABLETS BY MOUTH BEFORE BEDTIME FOR  RESTLESS  LEG), Disp: 30 tablet, Rfl: 12   sertraline (ZOLOFT) 100 MG tablet, Take 1 tablet by mouth once daily, Disp: 90 tablet, Rfl: 1   venlafaxine (EFFEXOR) 75 MG tablet, Take 1 tablet by mouth once daily, Disp: 90 tablet, Rfl: 0   vitamin B-12 (CYANOCOBALAMIN) 1000 MCG tablet, Take 1,000 mcg by mouth daily., Disp: , Rfl:   Review of Systems:  Negative unless indicated in HPI.   Physical Exam: Vitals:   12/26/22 1004 12/26/22 1018  BP:  133/75 129/75  Pulse: 73   Temp: 98.2 F (36.8 C)   TempSrc: Oral   SpO2: 94%   Weight: 204 lb 11.2 oz (92.9 kg)     Body mass index is 36.84 kg/m.   Physical Exam Vitals reviewed.  Constitutional:      Appearance: Normal appearance.  HENT:     Head: Normocephalic and atraumatic.  Eyes:     Conjunctiva/sclera: Conjunctivae normal.     Pupils: Pupils are equal, round, and reactive to light.  Cardiovascular:     Rate and Rhythm: Normal rate and regular rhythm.  Pulmonary:  Effort: Pulmonary effort is normal.     Breath sounds: Normal breath sounds.  Skin:    General: Skin is warm and dry.  Neurological:     General: No focal deficit present.     Mental Status: She is alert and oriented to person, place, and time.  Psychiatric:        Mood and Affect: Mood normal.        Behavior: Behavior normal.        Thought Content: Thought content normal.        Judgment: Judgment normal.      Impression and Plan:  Diabetes mellitus with coincident hypertension - Plan: Hemoglobin A1c  Pure hypercholesterolemia  HYPERTENSION, BENIGN ESSENTIAL  Morbid obesity  Vitamin D deficiency - Plan: VITAMIN D 25 Hydroxy (Vit-D Deficiency, Fractures)  -Check A1c to follow diabetic control. -Check vitamin D after she completed 12 weeks of high-dose supplementation. -Blood pressure is well-controlled. -LDL was optimal at last check.  She is on atorvastatin 20 mg daily. -Discussed healthy lifestyle, including increased physical activity and better food choices to promote weight loss.   Time spent:32 minutes reviewing chart, interviewing and examining patient and formulating plan of care.     Chaya Jan, MD [facility]

## 2023-01-01 DIAGNOSIS — M5416 Radiculopathy, lumbar region: Secondary | ICD-10-CM | POA: Diagnosis not present

## 2023-01-01 DIAGNOSIS — M25552 Pain in left hip: Secondary | ICD-10-CM | POA: Diagnosis not present

## 2023-01-07 DIAGNOSIS — D485 Neoplasm of uncertain behavior of skin: Secondary | ICD-10-CM | POA: Diagnosis not present

## 2023-01-07 DIAGNOSIS — D225 Melanocytic nevi of trunk: Secondary | ICD-10-CM | POA: Diagnosis not present

## 2023-01-07 DIAGNOSIS — L821 Other seborrheic keratosis: Secondary | ICD-10-CM | POA: Diagnosis not present

## 2023-01-07 DIAGNOSIS — L814 Other melanin hyperpigmentation: Secondary | ICD-10-CM | POA: Diagnosis not present

## 2023-01-31 ENCOUNTER — Other Ambulatory Visit: Payer: Self-pay | Admitting: Internal Medicine

## 2023-02-07 DIAGNOSIS — D485 Neoplasm of uncertain behavior of skin: Secondary | ICD-10-CM | POA: Diagnosis not present

## 2023-02-07 DIAGNOSIS — L905 Scar conditions and fibrosis of skin: Secondary | ICD-10-CM | POA: Diagnosis not present

## 2023-02-07 DIAGNOSIS — D225 Melanocytic nevi of trunk: Secondary | ICD-10-CM | POA: Diagnosis not present

## 2023-02-10 ENCOUNTER — Other Ambulatory Visit: Payer: Self-pay | Admitting: Internal Medicine

## 2023-02-10 DIAGNOSIS — E119 Type 2 diabetes mellitus without complications: Secondary | ICD-10-CM

## 2023-02-15 ENCOUNTER — Other Ambulatory Visit: Payer: Self-pay | Admitting: Internal Medicine

## 2023-02-15 DIAGNOSIS — E119 Type 2 diabetes mellitus without complications: Secondary | ICD-10-CM

## 2023-02-24 ENCOUNTER — Other Ambulatory Visit: Payer: Self-pay | Admitting: Internal Medicine

## 2023-02-24 DIAGNOSIS — N951 Menopausal and female climacteric states: Secondary | ICD-10-CM

## 2023-03-12 ENCOUNTER — Other Ambulatory Visit: Payer: Self-pay | Admitting: Internal Medicine

## 2023-03-12 DIAGNOSIS — F339 Major depressive disorder, recurrent, unspecified: Secondary | ICD-10-CM

## 2023-03-27 ENCOUNTER — Encounter: Payer: Self-pay | Admitting: Internal Medicine

## 2023-03-27 ENCOUNTER — Ambulatory Visit (INDEPENDENT_AMBULATORY_CARE_PROVIDER_SITE_OTHER): Payer: Medicare Other | Admitting: Internal Medicine

## 2023-03-27 VITALS — BP 120/80 | HR 65 | Temp 98.6°F | Wt 210.5 lb

## 2023-03-27 DIAGNOSIS — I10 Essential (primary) hypertension: Secondary | ICD-10-CM | POA: Diagnosis not present

## 2023-03-27 DIAGNOSIS — E7800 Pure hypercholesterolemia, unspecified: Secondary | ICD-10-CM | POA: Diagnosis not present

## 2023-03-27 DIAGNOSIS — Z7984 Long term (current) use of oral hypoglycemic drugs: Secondary | ICD-10-CM | POA: Diagnosis not present

## 2023-03-27 DIAGNOSIS — E119 Type 2 diabetes mellitus without complications: Secondary | ICD-10-CM | POA: Diagnosis not present

## 2023-03-27 LAB — POCT GLYCOSYLATED HEMOGLOBIN (HGB A1C): Hemoglobin A1C: 7 % — AB (ref 4.0–5.6)

## 2023-03-27 NOTE — Assessment & Plan Note (Signed)
Well controlled on current

## 2023-03-27 NOTE — Progress Notes (Signed)
Established Patient Office Visit     CC/Reason for Visit: Follow-up chronic conditions  HPI: Kayla [name] is a 62 y.o. female who is coming in today for the above mentioned reasons. Past Medical History is significant for: Hypertension, hyperlipidemia, type 2 diabetes, OSA, vitamin D deficiency, obesity, GERD.  She has been doing well.  Unfortunately she fell into the Medicare donut hole and is not able to afford Jardiance.  She quit taking it about a month ago.   Past Medical/Surgical History: Past Medical History:  Diagnosis Date   Allergy    Asthma    Carpal tunnel syndrome of right wrist 10/2012   Complication of anesthesia    hard to wake up after surg. 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" (06/05/2012); states has had no problems   GERD (gastroesophageal reflux disease)    Headache(784.0)    due to allergies   Heart murmur    states no problems   Hyperlipidemia    Hypertension    Mitral valve prolapse    OSA on CPAP    Osteoarthritis    knees   Overactive bladder    PTSD (post-traumatic stress disorder)    Sleep apnea    Type II diabetes mellitus (HCC)    NIDDM    Past Surgical History:  Procedure Laterality Date   ARTHRODESIS METATARSALPHALANGEAL JOINT (MTPJ) Right 08/24/2020   Procedure: ARTHRODESIS OF THE METATARSOPHALGEAL JOINT RIGHT FOOT;  Surgeon: Ferman Hamming, DPM;  Location: [facility];  Service: Podiatry;  Laterality: Right;   BREAST BIOPSY Left 04/25/2001   CARPAL TUNNEL RELEASE Right 10/24/2012   Procedure: CARPAL TUNNEL RELEASE;  Surgeon: Loreta Ave, MD;  Location: [facility];  Service: Orthopedics;  Laterality: Right;  RIGHT CARPAL TUNNEL RELEASE   KNEE ARTHROSCOPY Right 04/01/2008   KNEE ARTHROSCOPY W/ MENISCECTOMY Left 08/16/2011   partial medial   LASIK     bilateral    PUBOVAGINAL SLING  08/27/2001   cysto; suprapubic tube placement   SHOULDER ARTHROSCOPY W/ ROTATOR CUFF REPAIR Left 2004   TOTAL ABDOMINAL HYSTERECTOMY  08/27/2001   TOTAL KNEE ARTHROPLASTY  02/06/2012   Procedure: TOTAL KNEE ARTHROPLASTY;  Surgeon: Loreta Ave, MD;  Location: [facility] OR;  Service: Orthopedics;  Laterality: Right;  total knee right side   TOTAL KNEE ARTHROPLASTY  06/04/2012   Procedure: TOTAL KNEE ARTHROPLASTY;  Surgeon: Loreta Ave, MD;  Location: [facility] OR;  Service: Orthopedics;  Laterality: Left;   ULNAR COLLATERAL LIGAMENT REPAIR Left 11/08/2017   Procedure: REPAIR/RECONSTRUCTION LEFT THUMB ULNAR COLLATERAL LIGAMENT;  Surgeon: Cindee Salt, MD;  Location: [facility];  Service: Orthopedics;  Laterality: Left;   VAGINAL HYSTERECTOMY  1980's    Social History:  reports that she has never smoked. She has never used smokeless tobacco. She reports that she does not drink alcohol and does not use drugs.  Allergies: Allergies  Allergen Reactions   Morphine And Codeine Shortness Of Breath and Itching   Ibuprofen Itching    Family History:  Family History  Problem Relation Age of Onset   COPD Mother    Heart disease Mother    Heart disease Father    Heart attack Brother    Colon cancer Neg Hx    Stomach cancer Neg Hx    Esophageal cancer  Neg Hx    Pancreatic cancer Neg Hx      Current Outpatient Medications:    albuterol (PROVENTIL HFA) 108 (90 Base) MCG/ACT inhaler, Inhale 2 puffs into the lungs every 6 (six) hours as needed for wheezing or shortness of breath., Disp: 18 g, Rfl: 3   atorvastatin (LIPITOR) 20 MG tablet, Take 1 tablet (20 mg total) by mouth daily., Disp: 100 tablet, Rfl: 1   blood glucose meter kit and supplies KIT, Dispense based on patient and insurance preference. Use up to four times daily as directed., Disp: 1 each, Rfl: 0   budesonide-formoterol (SYMBICORT) 160-4.5 MCG/ACT inhaler, Inhale 2 puffs then rinse mouth, twice daily, Disp:  1 each, Rfl: 6   clonazePAM (KLONOPIN) 0.5 MG tablet, TAKE 1 TO 2 TABLETS BY MOUTH AS NEEDED FOR SLEEP, Disp: 60 tablet, Rfl: 5   fluticasone (FLONASE) 50 MCG/ACT nasal spray, Place 1 spray into both nostrils daily., Disp: 16 g, Rfl: 2   lisinopril (ZESTRIL) 10 MG tablet, TAKE 1 TABLET BY MOUTH ONCE DAILY . APPOINTMENT REQUIRED FOR FUTURE REFILLS, Disp: 90 tablet, Rfl: 1   loperamide (IMODIUM) 2 MG capsule, Take 4 mg by mouth as needed for diarrhea or loose stools., Disp: , Rfl:    LORazepam (ATIVAN) 0.5 MG tablet, TAKE 1 TABLET BY MOUTH EVERY 8 HOURS AS NEEDED FOR ANXIETY, Disp: 60 tablet, Rfl: 0   meloxicam (MOBIC) 15 MG tablet, Take 1 tablet by mouth once daily, Disp: 90 tablet, Rfl: 0   metFORMIN (GLUCOPHAGE-XR) 500 MG 24 hr tablet, TAKE 1 TABLET BY MOUTH ONCE DAILY IN THE MORNING AND 1 AT BEDTIME, Disp: 180 tablet, Rfl: 1   MYRBETRIQ 50 MG TB24 tablet, Take 50 mg by mouth daily., Disp: , Rfl:    OVER THE COUNTER MEDICATION, Take 1 capsule by mouth daily. Vitamin A / Zinc, Disp: , Rfl:    pantoprazole (PROTONIX) 40 MG tablet, Take 1 tablet by mouth once daily, Disp: 90 tablet, Rfl: 1   rifaximin (XIFAXAN) 550 MG TABS tablet, Take 1 tablet (550 mg total) by mouth 3 (three) times daily., Disp: 42 tablet, Rfl: 0   rOPINIRole (REQUIP) 0.25 MG tablet, TAKE 1 TO 2 TABLETS BY MOUTH BEFORE BEDTIME FOR  RESTLESS  LEG (Patient taking differently: Take 0.25-0.5 mg by mouth at bedtime as needed (RLS). TAKE 1 TO 2 TABLETS BY MOUTH BEFORE BEDTIME FOR  RESTLESS  LEG), Disp: 30 tablet, Rfl: 12   sertraline (ZOLOFT) 100 MG tablet, Take 1 tablet by mouth once daily, Disp: 90 tablet, Rfl: 0   venlafaxine (EFFEXOR) 75 MG tablet, Take 1 tablet by mouth once daily, Disp: 90 tablet, Rfl: 1   vitamin B-12 (CYANOCOBALAMIN) 1000 MCG tablet, Take 1,000 mcg by mouth daily., Disp: , Rfl:    empagliflozin (JARDIANCE) 25 MG TABS tablet, Take 1 tablet by mouth once daily (Patient not taking: Reported on 03/27/2023), Disp: 90  tablet, Rfl: 1  Review of Systems:  Negative unless indicated in HPI.   Physical Exam: Vitals:   03/27/23 1027  BP: 120/80  Pulse: 65  Temp: 98.6 F (37 C)  TempSrc: Oral  SpO2: 98%  Weight: 210 lb 8 oz (95.5 kg)    Body mass index is 37.89 kg/m.   Physical Exam Vitals reviewed.  Constitutional:      Appearance: Normal appearance.  HENT:     Head: Normocephalic and atraumatic.  Eyes:     Conjunctiva/sclera: Conjunctivae normal.     Pupils: Pupils are equal, round, and  reactive to light.  Cardiovascular:     Rate and Rhythm: Normal rate and regular rhythm.  Pulmonary:     Effort: Pulmonary effort is normal.     Breath sounds: Normal breath sounds.  Skin:    General: Skin is warm and dry.  Neurological:     General: No focal deficit present.     Mental Status: She is alert and oriented to person, place, and time.  Psychiatric:        Mood and Affect: Mood normal.        Behavior: Behavior normal.        Thought Content: Thought content normal.        Judgment: Judgment normal.      Impression and Plan:  Diabetes mellitus with coincident hypertension (HCC) Assessment & Plan: Fair control with an A1c of 7.0.  On metformin only.  She quit taking Jardiance a month ago as she fell into the Medicare donut hole.  Will ask pharmacy if any patient assistance programs might be available for her.  Orders: -     POCT glycosylated hemoglobin (Hb A1C) -     AMB Referral to Pharmacy Medication Management  Pure hypercholesterolemia Assessment & Plan: Well-controlled with most recent LDL 72.  On atorvastatin 20 mg daily.   HYPERTENSION, BENIGN ESSENTIAL Assessment & Plan: Well-controlled on current.      Time spent:32 minutes reviewing chart, interviewing and examining patient and formulating plan of care.     Chaya Jan, MD [facility]

## 2023-03-27 NOTE — Assessment & Plan Note (Signed)
Well-controlled with most recent LDL 72.  On atorvastatin 20 mg daily.

## 2023-03-27 NOTE — Assessment & Plan Note (Signed)
Fair control with an A1c of 7.0.  On metformin only.  She quit taking Jardiance a month ago as she fell into the Medicare donut hole.  Will ask pharmacy if any patient assistance programs might be available for her.

## 2023-03-28 ENCOUNTER — Telehealth: Payer: Self-pay

## 2023-03-28 NOTE — Progress Notes (Signed)
   Care Guide Note  03/28/2023 Name: Kayla [name] MRN: [id number] DOB: 06/02/1961  Referred by: Philip Aspen, Limmie Patricia, MD Reason for referral : Care Coordination (Outreach to schedule with Pharm d )   Kayla [name] is a 62 y.o. year old female who is a primary care patient of Philip Aspen, Limmie Patricia, MD. Eunice Blase Venuti was referred to the pharmacist for assistance related to DM.    An unsuccessful telephone outreach was attempted today to contact the patient who was referred to the pharmacy team for assistance with medication assistance. Additional attempts will be made to contact the patient.   Penne Lash, RMA Care Guide [facility]  [place], [place] [address] Direct Dial: [phone] [email or web address]

## 2023-03-28 NOTE — Progress Notes (Signed)
   Care Guide Note  03/28/2023 Name: Kayla [name] MRN: [id number] DOB: 07-Oct-1960  Referred by: Philip Aspen, Limmie Patricia, MD Reason for referral : Care Coordination (Outreach to schedule with Pharm d )   Kayla [name] is a 62 y.o. year old female who is a primary care patient of Philip Aspen, Limmie Patricia, MD. Eunice Blase Steffensmeier was referred to the pharmacist for assistance related to DM.    Successful contact was made with the patient to discuss pharmacy services including being ready for the pharmacist to call at least 5 minutes before the scheduled appointment time, to have medication bottles and any blood sugar or blood pressure readings ready for review. The patient agreed to meet with the pharmacist via with the pharmacist via telephone visit on (date/time).  04/22/2023  Penne Lash, RMA Care Guide [facility]  [place], [place] [address] Direct Dial: [phone] [email or web address]

## 2023-04-15 ENCOUNTER — Other Ambulatory Visit: Payer: Self-pay | Admitting: Internal Medicine

## 2023-04-15 DIAGNOSIS — F339 Major depressive disorder, recurrent, unspecified: Secondary | ICD-10-CM

## 2023-04-15 DIAGNOSIS — E119 Type 2 diabetes mellitus without complications: Secondary | ICD-10-CM

## 2023-04-19 ENCOUNTER — Encounter: Payer: Self-pay | Admitting: Internal Medicine

## 2023-04-19 DIAGNOSIS — G4733 Obstructive sleep apnea (adult) (pediatric): Secondary | ICD-10-CM

## 2023-04-22 ENCOUNTER — Other Ambulatory Visit: Payer: Medicare Other

## 2023-04-22 NOTE — Progress Notes (Signed)
04/22/2023 Name: Kayla [name] MRN: [id number] DOB: 1961-07-06  Chief Complaint  Patient presents with   Medication Assistance   Kayla [name] is a 62 y.o. year old female who presented for a telephone visit.   They were referred to the pharmacist by their PCP for assistance in managing medication access.   Subjective:  Care Team: Primary Care Provider: Philip [name], Kayla Patricia, MD  Medication Access/Adherence  Current Pharmacy:  Kayla [name] [address], [place] - [address] [place] [address] [place] [address] Kayla [name] [place] [address] Phone: [phone] Fax: [phone]  -Patient reports affordability concerns with their medications: Yes -Patient is currently in the donut hole and unable to afford copays for Jardiance 25mg  or Symbicort 160/4.5 -Patient reports access/transportation concerns to their pharmacy: No  -Patient reports adherence concerns with their medications:  Yes  -She has been without Jardiance for over a month now and is not taking Symbicort as prescribed  Objective: Lab Results  Component Value Date   HGBA1C 7.0 (A) 03/27/2023   Medications Reviewed Today     Reviewed by Kayla [name], RPH (Pharmacist) on 04/22/23 at 1012  Med List Status: <None>   Medication Order Taking? Sig Documenting Provider Last Dose Status Informant  albuterol (PROVENTIL HFA) 108 (90 Base) MCG/ACT inhaler [id number] Yes Inhale 2 puffs into the lungs every 6 (six) hours as needed for wheezing or shortness of breath. Kayla [name], Kayla Patricia, MD Taking Active Self  atorvastatin (LIPITOR) 20 MG tablet [id number] Yes Take 1 tablet (20 mg total) by mouth daily. Kayla [name], Kayla Patricia, MD Taking Active   blood glucose meter kit and supplies KIT [id number]  Dispense based on patient and insurance preference. Use up to four times daily as directed. Kayla [name], Kayla Patricia, MD  Active   budesonide-formoterol [facility]) 160-4.5 MCG/ACT inhaler [id number] Yes Inhale 2 puffs  then rinse mouth, twice daily Kayla [name] D, MD Taking Active   clonazePAM (KLONOPIN) 0.5 MG tablet [id number] Yes TAKE 1 TO 2 TABLETS BY MOUTH AS NEEDED FOR SLEEP [name], Kayla D, MD Taking Active   empagliflozin (JARDIANCE) 25 MG TABS tablet [id number] No Take 1 tablet by mouth once daily  Patient not taking: Reported on 03/27/2023   Kayla [name], Kayla Patricia, MD Not Taking Active   fluticasone [facility]) 50 MCG/ACT nasal spray [id number] Yes Place 1 spray into both nostrils daily. Kayla Nose, NP Taking Active   lisinopril (ZESTRIL) 10 MG tablet [id number] Yes TAKE 1 TABLET BY MOUTH ONCE DAILY . APPOINTMENT REQUIRED FOR FUTURE REFILLS Kayla [name], Kayla Patricia, MD Taking Active   loperamide (IMODIUM) 2 MG capsule [id number] Yes Take 4 mg by mouth as needed for diarrhea or loose stools. [provider] Taking Active Self  LORazepam (ATIVAN) 0.5 MG tablet [id number] Yes TAKE 1 TABLET BY MOUTH EVERY 8 HOURS AS NEEDED FOR ANXIETY Kayla [name], Kayla Patricia, MD Taking Active   meloxicam [facility]) 15 MG tablet [id number] Yes Take 1 tablet by mouth once daily Kayla [name], Kayla Patricia, MD Taking Active   metFORMIN (GLUCOPHAGE-XR) 500 MG 24 hr tablet [id number] Yes TAKE 1 TABLET BY MOUTH ONCE DAILY IN THE MORNING AND 1 AT BEDTIME Kayla Cloud, MD Taking Active   MYRBETRIQ 50 MG TB24 tablet [id number] Yes Take 50 mg by mouth daily. [provider] Taking Active   OVER THE COUNTER MEDICATION [id number] No Take 1 capsule by mouth daily. Vitamin A / Zinc [provider] Unknown Active   pantoprazole (PROTONIX) 40  MG tablet [id number] Yes Take 1 tablet by mouth once daily Kayla [name], Kayla Patricia, MD Taking Active   rifaximin (XIFAXAN) 550 MG TABS tablet [id number] Yes Take 1 tablet (550 mg total) by mouth 3 (three) times daily. Kayla Fiedler, MD Taking Active   rOPINIRole (REQUIP) 0.25 MG tablet [id number] Yes TAKE 1 TO 2 TABLETS BY MOUTH BEFORE BEDTIME FOR  RESTLESS   LEG  Patient taking differently: Take 0.25-0.5 mg by mouth at bedtime as needed (RLS). TAKE 1 TO 2 TABLETS BY MOUTH BEFORE BEDTIME FOR  RESTLESS  LEG   Kayla [name] D, MD Taking Active Self  sertraline (ZOLOFT) 100 MG tablet [id number] Yes Take 1 tablet by mouth once daily Kayla [name], Kayla Patricia, MD Taking Active   venlafaxine [facility]) 75 MG tablet [id number] Yes Take 1 tablet by mouth once daily Kayla [name], Kayla Patricia, MD Taking Active   vitamin B-12 (CYANOCOBALAMIN) 1000 MCG tablet [id number] Yes Take 1,000 mcg by mouth daily. [provider] Taking Active Self  Med List Note Kayla [name], Kayla D, MD 02/20/11 2026): CPAP 9           Assessment/Plan:   Medication Access/Adherence -Based on [facility], patient would likely qualify for Jardiance PAP through BI and would Breztri PAP through AZ&Me -BI requires patients first apply for LIS through Medicare Extra Help and be denied -AZ&Me PAP based on HHI alone (and has higher threshold than BI) and also covers [place].  I recommend changing Jardiance 25mg  to [place] 10mg  and Symbicort to [place]; so we can pursue PAP for these medications. -Consulting providers and will proceed based on their agreement/disagreement with medication changes  Follow Up Plan: Kayla [name], Kayla [name], Kayla [name]   Kayla [name], Kayla [name], Kayla [name]

## 2023-04-25 NOTE — Progress Notes (Signed)
   04/25/2023  Patient ID: Kayla [name], female   DOB: [birth year], 62 y.o.   MRN: [id number]  Dr. Maple Hudson is in agreement with plan to change patient to [facility], so she can receive through AZ&Me PAP programs.  He requests Breztri 2 puffs twice daily, rinsing mouth after each use #3 inhalers w/ 3 refills.  Will coordinate with medication assistance team to initiate application process.  Still awaiting response from Dr. Ardyth Harps regarding changing Jardiance to Marcelline Deist, so patient may also receive this through AZ&Me PAP.  Lenna Gilford, PharmD, DPLA

## 2023-04-30 ENCOUNTER — Telehealth: Payer: Self-pay

## 2023-04-30 NOTE — Telephone Encounter (Signed)
-----   Message from Lenna Gilford sent at 04/25/2023  9:45 AM EDT ----- Good morning,  Dr. Maple Hudson is in agreement to switch Ms. Wyrick to [employer] to pursue AZ&Me PAP.  Would you all please start the application process for this?  Thank you!  Lenna Gilford, PharmD, DPLA

## 2023-04-30 NOTE — Telephone Encounter (Signed)
PAP: PAP application for Breztri, Publishing rights manager (AZ&Me)) has been mailed to pt's home address on file. Will fax provider portion of application to provider's office when pt's portion is received.  Georga Bora [facility] Patient Advocate [phone]

## 2023-05-02 ENCOUNTER — Encounter: Payer: Self-pay | Admitting: Internal Medicine

## 2023-05-02 ENCOUNTER — Ambulatory Visit (INDEPENDENT_AMBULATORY_CARE_PROVIDER_SITE_OTHER): Payer: Medicare Other | Admitting: Internal Medicine

## 2023-05-02 ENCOUNTER — Telehealth: Payer: Self-pay

## 2023-05-02 VITALS — BP 120/80 | HR 75 | Temp 98.1°F | Wt 215.3 lb

## 2023-05-02 DIAGNOSIS — R21 Rash and other nonspecific skin eruption: Secondary | ICD-10-CM | POA: Diagnosis not present

## 2023-05-02 MED ORDER — TACROLIMUS 0.1 % EX CREA
TOPICAL_CREAM | CUTANEOUS | 0 refills | Status: AC
Start: 2023-05-02 — End: ?

## 2023-05-02 NOTE — Progress Notes (Signed)
Established Patient Office Visit     CC/Reason for Visit: Rash around mouth  HPI: Kayla [name] is a 62 y.o. female who is coming in today for the above mentioned reasons.  For about a month she has been having a rash surrounding the corners of her mouth mainly the right side.  She states that it will start as red bumps and then leaves a patch of itchy dry skin behind.   Past Medical/Surgical History: Past Medical History:  Diagnosis Date   Allergy    Asthma    Carpal tunnel syndrome of right wrist 10/2012   Complication of anesthesia    hard to wake up after surg. 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" (06/05/2012); states has had no problems   GERD (gastroesophageal reflux disease)    Headache(784.0)    due to allergies   Heart murmur    states no problems   Hyperlipidemia    Hypertension    Mitral valve prolapse    OSA on CPAP    Osteoarthritis    knees   Overactive bladder    PTSD (post-traumatic stress disorder)    Sleep apnea    Type II diabetes mellitus (HCC)    NIDDM    Past Surgical History:  Procedure Laterality Date   ARTHRODESIS METATARSALPHALANGEAL JOINT (MTPJ) Right 08/24/2020   Procedure: ARTHRODESIS OF THE METATARSOPHALGEAL JOINT RIGHT FOOT;  Surgeon: Ferman Hamming, DPM;  Location: [facility];  Service: Podiatry;  Laterality: Right;   BREAST BIOPSY Left 04/25/2001   CARPAL TUNNEL RELEASE Right 10/24/2012   Procedure: CARPAL TUNNEL RELEASE;  Surgeon: Loreta Ave, MD;  Location: [facility];  Service: Orthopedics;  Laterality: Right;  RIGHT CARPAL TUNNEL RELEASE   KNEE ARTHROSCOPY Right 04/01/2008   KNEE ARTHROSCOPY W/ MENISCECTOMY Left 08/16/2011   partial medial   LASIK     bilateral   PUBOVAGINAL SLING  08/27/2001   cysto; suprapubic tube placement   SHOULDER ARTHROSCOPY W/  ROTATOR CUFF REPAIR Left 2004   TOTAL ABDOMINAL HYSTERECTOMY  08/27/2001   TOTAL KNEE ARTHROPLASTY  02/06/2012   Procedure: TOTAL KNEE ARTHROPLASTY;  Surgeon: Loreta Ave, MD;  Location: [facility];  Service: Orthopedics;  Laterality: Right;  total knee right side   TOTAL KNEE ARTHROPLASTY  06/04/2012   Procedure: TOTAL KNEE ARTHROPLASTY;  Surgeon: Loreta Ave, MD;  Location: [facility];  Service: Orthopedics;  Laterality: Left;   ULNAR COLLATERAL LIGAMENT REPAIR Left 11/08/2017   Procedure: REPAIR/RECONSTRUCTION LEFT THUMB ULNAR COLLATERAL LIGAMENT;  Surgeon: Cindee Salt, MD;  Location: [facility];  Service: Orthopedics;  Laterality: Left;   VAGINAL HYSTERECTOMY  1980's    Social History:  reports that she has never smoked. She has never used smokeless tobacco. She reports that she does not drink alcohol and does not use drugs.  Allergies: Allergies  Allergen Reactions   Morphine And Codeine Shortness Of Breath and Itching   Ibuprofen Itching    Family History:  Family History  Problem Relation Age of Onset   COPD Mother    Heart disease Mother    Heart disease Father    Heart attack Brother    Colon cancer Neg Hx    Stomach cancer Neg Hx    Esophageal cancer Neg Hx    Pancreatic cancer  Neg Hx      Current Outpatient Medications:    albuterol (PROVENTIL HFA) 108 (90 Base) MCG/ACT inhaler, Inhale 2 puffs into the lungs every 6 (six) hours as needed for wheezing or shortness of breath., Disp: 18 g, Rfl: 3   atorvastatin (LIPITOR) 20 MG tablet, Take 1 tablet (20 mg total) by mouth daily., Disp: 100 tablet, Rfl: 1   blood glucose meter kit and supplies KIT, Dispense based on patient and insurance preference. Use up to four times daily as directed., Disp: 1 each, Rfl: 0   Budeson-Glycopyrrol-Formoterol (BREZTRI AEROSPHERE) 160-9-4.8 MCG/ACT AERO, Inhale 2 puffs into the lungs in the morning and at bedtime., Disp: , Rfl:    clonazePAM (KLONOPIN) 0.5 MG tablet, TAKE 1 TO 2  TABLETS BY MOUTH AS NEEDED FOR SLEEP, Disp: 60 tablet, Rfl: 5   dapagliflozin propanediol (FARXIGA) 10 MG TABS tablet, Take 10 mg by mouth daily., Disp: , Rfl:    fluticasone (FLONASE) 50 MCG/ACT nasal spray, Place 1 spray into both nostrils daily., Disp: 16 g, Rfl: 2   lisinopril (ZESTRIL) 10 MG tablet, TAKE 1 TABLET BY MOUTH ONCE DAILY . APPOINTMENT REQUIRED FOR FUTURE REFILLS, Disp: 90 tablet, Rfl: 1   loperamide (IMODIUM) 2 MG capsule, Take 4 mg by mouth as needed for diarrhea or loose stools., Disp: , Rfl:    LORazepam (ATIVAN) 0.5 MG tablet, TAKE 1 TABLET BY MOUTH EVERY 8 HOURS AS NEEDED FOR ANXIETY, Disp: 60 tablet, Rfl: 0   meloxicam (MOBIC) 15 MG tablet, Take 1 tablet by mouth once daily, Disp: 90 tablet, Rfl: 0   metFORMIN (GLUCOPHAGE-XR) 500 MG 24 hr tablet, TAKE 1 TABLET BY MOUTH ONCE DAILY IN THE MORNING AND 1 AT BEDTIME, Disp: 180 tablet, Rfl: 1   MYRBETRIQ 50 MG TB24 tablet, Take 50 mg by mouth daily., Disp: , Rfl:    OVER THE COUNTER MEDICATION, Take 1 capsule by mouth daily. Vitamin A / Zinc, Disp: , Rfl:    pantoprazole (PROTONIX) 40 MG tablet, Take 1 tablet by mouth once daily, Disp: 90 tablet, Rfl: 1   rifaximin (XIFAXAN) 550 MG TABS tablet, Take 1 tablet (550 mg total) by mouth 3 (three) times daily., Disp: 42 tablet, Rfl: 0   rOPINIRole (REQUIP) 0.25 MG tablet, TAKE 1 TO 2 TABLETS BY MOUTH BEFORE BEDTIME FOR  RESTLESS  LEG (Patient taking differently: Take 0.25-0.5 mg by mouth at bedtime as needed (RLS). TAKE 1 TO 2 TABLETS BY MOUTH BEFORE BEDTIME FOR  RESTLESS  LEG), Disp: 30 tablet, Rfl: 12   sertraline (ZOLOFT) 100 MG tablet, Take 1 tablet by mouth once daily, Disp: 90 tablet, Rfl: 0   Tacrolimus 0.1 % CREA, Apply small amount to facial rash, Disp: 30 g, Rfl: 0   venlafaxine (EFFEXOR) 75 MG tablet, Take 1 tablet by mouth once daily, Disp: 90 tablet, Rfl: 1   vitamin B-12 (CYANOCOBALAMIN) 1000 MCG tablet, Take 1,000 mcg by mouth daily., Disp: , Rfl:   Review of Systems:   Negative unless indicated in HPI.   Physical Exam: Vitals:   05/02/23 1426  BP: 120/80  Pulse: 75  Temp: 98.1 F (36.7 C)  TempSrc: Oral  SpO2: 98%  Weight: 215 lb 4.8 oz (97.7 kg)    Body mass index is 38.75 kg/m.   Physical Exam Skin:    Comments: Small area of redness and skin desquamation at the corner of her right lip      Impression and Plan:  Rash of mouth present on examination -  Tacrolimus; Apply small amount to facial rash  Dispense: 30 g; Refill: 0   -Appears to be possibly inflammatory or allergic in nature, does not look like HSV at present but have advised her to send me a picture when rash is fresh. -Tacrolimus cream as needed.  Time spent:21 minutes reviewing chart, interviewing and examining patient and formulating plan of care.     Chaya Jan, MD  Primary Care at [facility]

## 2023-05-02 NOTE — Progress Notes (Signed)
   05/02/2023  Patient ID: Kayla [name], female   DOB: [birth year], 62 y.o.   MRN: [id number]  Patient assistance program follow-up  -Dr. Ardyth Harps and patient okay switching Jardiance to [place] 10mg  daily -AZ& Me application already sent to patient to complete for [facility]; coordinating with medication assistance team to get Farxiga 10mg  daily added to this -Notifying patient via MyChart, so she knows the plan and to hold off on completing LIS application at this time (that was a requirement for Jardiance PAP)  Kayla [name], PharmD, DPLA

## 2023-05-07 NOTE — Telephone Encounter (Signed)
PAP: PAP application for Farxiga, Publishing rights manager (AZ&Me)) has been mailed to pt's home address on file. Will fax provider portion of application to provider's office when pt's portion is received.    PLEASE BE ADVISED ADDED MED TO AZ&ME PROVIDER PAGES

## 2023-05-15 DIAGNOSIS — G4733 Obstructive sleep apnea (adult) (pediatric): Secondary | ICD-10-CM | POA: Diagnosis not present

## 2023-05-17 DIAGNOSIS — Z130 Encounter for screening for diseases of the blood and blood-forming organs and certain disorders involving the immune mechanism: Secondary | ICD-10-CM | POA: Diagnosis not present

## 2023-05-17 DIAGNOSIS — E119 Type 2 diabetes mellitus without complications: Secondary | ICD-10-CM | POA: Diagnosis not present

## 2023-05-17 DIAGNOSIS — E559 Vitamin D deficiency, unspecified: Secondary | ICD-10-CM | POA: Diagnosis not present

## 2023-05-17 DIAGNOSIS — E038 Other specified hypothyroidism: Secondary | ICD-10-CM | POA: Diagnosis not present

## 2023-05-17 DIAGNOSIS — G4733 Obstructive sleep apnea (adult) (pediatric): Secondary | ICD-10-CM | POA: Diagnosis not present

## 2023-05-17 DIAGNOSIS — E785 Hyperlipidemia, unspecified: Secondary | ICD-10-CM | POA: Diagnosis not present

## 2023-05-21 NOTE — Telephone Encounter (Signed)
FAXED PROVIDER PORTION for BREZTRI   [employer] TO PROVIDER [place] [facility] OFFICE   PLEASE BE ADVISED

## 2023-05-27 DIAGNOSIS — E785 Hyperlipidemia, unspecified: Secondary | ICD-10-CM | POA: Diagnosis not present

## 2023-05-27 DIAGNOSIS — E119 Type 2 diabetes mellitus without complications: Secondary | ICD-10-CM | POA: Diagnosis not present

## 2023-05-27 DIAGNOSIS — I10 Essential (primary) hypertension: Secondary | ICD-10-CM | POA: Diagnosis not present

## 2023-05-27 DIAGNOSIS — E559 Vitamin D deficiency, unspecified: Secondary | ICD-10-CM | POA: Diagnosis not present

## 2023-05-27 DIAGNOSIS — J45909 Unspecified asthma, uncomplicated: Secondary | ICD-10-CM | POA: Diagnosis not present

## 2023-05-30 NOTE — Telephone Encounter (Signed)
PAP: Application for Kayla [name] has been submitted to PAP Companies: [employer], via fax   PLEASE BE ADVISED

## 2023-06-05 NOTE — Telephone Encounter (Signed)
RE FAXED PROVIDER PAGES TO PROVIDER OFFICE OF ESTELA HERNANDEZ [facility]CO[facility] FOR FARXIGA(AZ[employer]).  PLEASE BE ADVISED

## 2023-06-05 NOTE — Telephone Encounter (Signed)
RECEIVED A MISSING REQUEST OF DOB ON PT PAGES FROM THE COMPANY AND RESUMITTED WITH A COPY OF LICENSE PROVIDING PROOF OF DOB.     PLEASE BE ADVISED

## 2023-06-12 ENCOUNTER — Telehealth: Payer: Self-pay

## 2023-06-12 ENCOUNTER — Other Ambulatory Visit: Payer: Self-pay | Admitting: Internal Medicine

## 2023-06-12 DIAGNOSIS — E119 Type 2 diabetes mellitus without complications: Secondary | ICD-10-CM

## 2023-06-12 NOTE — Progress Notes (Signed)
   06/12/2023  Patient ID: Kayla [name], female   DOB: 02-Jan-1961, 62 y.o.   MRN: [id number]  Markus Daft has been approved for patient. Do not see where Marcelline Deist was added to the application to as previously discussed, will follow up with patient assistance team.  Sherrill Raring, PharmD Clinical Pharmacist [phone]

## 2023-06-13 ENCOUNTER — Telehealth: Payer: Self-pay | Admitting: Internal Medicine

## 2023-06-14 NOTE — Telephone Encounter (Signed)
Spoke with [employer] and additional application for Marcelline Deist is required since [place] app has already been approved and processed. Patient completed her portion in office today, will submit to company for approval. Provider portion pending.  Sherrill Raring, PharmD Clinical Pharmacist [phone]

## 2023-06-16 DIAGNOSIS — G4733 Obstructive sleep apnea (adult) (pediatric): Secondary | ICD-10-CM | POA: Diagnosis not present

## 2023-06-18 NOTE — Telephone Encounter (Signed)
PAP: Patient assistance application for Kayla [name] has been approved by PAP Companies: [employer] from 05/31/2023 to 09/09/2024. Medication should be delivered to PAP Delivery: Home For further shipping updates, please contact [employer] ([employer]) at [phone] Pt ID is: [id number]   PAP: Patient assistance application for Kayla [name] has been approved by PAP Companies: [employer] from 05/31/2023 to 09/09/2024. Medication should be delivered to PAP Delivery: Home For further shipping updates, please contact [employer] ([employer]) at [phone] Pt ID is: [id number]  LETTER OF APPROVAL IN MEDIA OF CHART  PLEASE BE ADVISED GAVE VERBAL SCRIPT FOR FARXIGA I DID NOT RECEIVED PROVIDER PAGE BACK

## 2023-06-21 LAB — HM DIABETES EYE EXAM

## 2023-07-07 ENCOUNTER — Other Ambulatory Visit: Payer: Self-pay | Admitting: Internal Medicine

## 2023-07-07 DIAGNOSIS — I10 Essential (primary) hypertension: Secondary | ICD-10-CM

## 2023-07-08 DIAGNOSIS — E038 Other specified hypothyroidism: Secondary | ICD-10-CM | POA: Diagnosis not present

## 2023-07-10 DIAGNOSIS — Z72820 Sleep deprivation: Secondary | ICD-10-CM | POA: Diagnosis not present

## 2023-07-10 DIAGNOSIS — E038 Other specified hypothyroidism: Secondary | ICD-10-CM | POA: Diagnosis not present

## 2023-07-10 DIAGNOSIS — M2550 Pain in unspecified joint: Secondary | ICD-10-CM | POA: Diagnosis not present

## 2023-07-17 DIAGNOSIS — G4733 Obstructive sleep apnea (adult) (pediatric): Secondary | ICD-10-CM | POA: Diagnosis not present

## 2023-07-29 ENCOUNTER — Other Ambulatory Visit (INDEPENDENT_AMBULATORY_CARE_PROVIDER_SITE_OTHER): Payer: Medicare Other

## 2023-07-29 DIAGNOSIS — E119 Type 2 diabetes mellitus without complications: Secondary | ICD-10-CM

## 2023-07-29 NOTE — Progress Notes (Signed)
07/29/2023 Name: Kayla [name] MRN: [id number] DOB: 1961/08/31  Chief Complaint  Patient presents with   Diabetes   Medication Management    Kayla [name] is [name] 62 y.o. year old female who presented for [name] telephone visit.   They were referred to the pharmacist by their PCP for assistance in managing diabetes and medication access.    Subjective:  Care Team: Primary Care Provider: Philip [name], Kayla Patricia, [name]   Medication Access/Adherence  Current Pharmacy:  [facility] [address], [place] - [address] [place] [address] [place] [address] Phone: [phone] Fax: [phone]   Patient reports affordability concerns with their medications: No  Patient reports access/transportation concerns to their pharmacy: No  Patient reports adherence concerns with their medications:  No     Diabetes:  Current medications: Farxiga 10mg , Metformin XR 500mg  BID Medications tried in the past: Jardiance (switched to farxiga due to cost)  Current glucose readings: 97, 100, 124 Checks sugars once weekly, fasting   Patient denies hypoglycemic s/sx including dizziness, shakiness, sweating. Patient denies hyperglycemic symptoms including polyuria, polydipsia, polyphagia, nocturia, neuropathy, blurred vision.  Current meal patterns:  -Mindful of carb intake  Current medication access support: Kayla [name] (and Breztri) through [employer] through 09/09/24   Objective:  Lab Results  Component Value Date   HGBA1C 7.0 ([name]) 03/27/2023    Lab Results  Component Value Date   CREATININE 1.04 09/26/2022   BUN 13 09/26/2022   NA 135 09/26/2022   K 4.0 09/26/2022   CL 98 09/26/2022   CO2 30 09/26/2022    Lab Results  Component Value Date   CHOL 140 09/26/2022   HDL 42.50 09/26/2022   LDLCALC 72 09/26/2022   LDLDIRECT 113.3 04/21/2008   TRIG 124.0 09/26/2022   CHOLHDL 3 09/26/2022    Medications Reviewed Today     Reviewed by Kayla [name], Kayla [name]  (Pharmacist) on 07/29/23 at 0913  Med List Status: <None>   Medication Order Taking? Sig Documenting Provider Last Dose Status Informant  albuterol (PROVENTIL HFA) 108 (90 Base) MCG/ACT inhaler [id number] Yes Inhale 2 puffs into the lungs every 6 (six) hours as needed for wheezing or shortness of breath. Kayla [name], Kayla Patricia, [name] Taking Active Self  atorvastatin (LIPITOR) 20 MG tablet [id number] Yes Take 1 tablet (20 mg total) by mouth daily. Kayla [name], Kayla Patricia, [name] Taking Active   blood glucose meter kit and supplies KIT [id number] Yes Dispense based on patient and insurance preference. Use up to four times daily as directed. Kayla [name], Kayla Patricia, [name] Taking Active   Budeson-Glycopyrrol-Formoterol Kayla [name] AEROSPHERE) 160-9-4.8 MCG/ACT Kayla [name] [id number] Yes Inhale 2 puffs into the lungs in the morning and at bedtime. Provider, Historical, [name] Taking Active            Med Note Kayla [name], Kayla [name]   Thu May 02, 2023  1:31 PM) Samples while working on PAP  clonazePAM (KLONOPIN) 0.5 MG tablet [id number] Yes TAKE 1 TO 2 TABLETS BY MOUTH AS NEEDED FOR SLEEP [name], Kayla [name], [name] Taking Active   dapagliflozin propanediol (FARXIGA) 10 MG TABS tablet [id number] Yes Take 10 mg by mouth daily. Provider, Historical, [name] Taking Active            Med Note Kayla [name], Kayla [name]   Thu May 02, 2023  1:30 PM) Working on PAP  fluticasone [facility]) 50 MCG/ACT nasal spray [id number] Yes Place 1 spray into both nostrils daily. Kayla Nose, NP Taking Active  lisinopril (ZESTRIL) 10 MG tablet [id number] Yes Take 1 tablet by mouth once daily Kayla [name], Kayla Patricia, [name] Taking Active   loperamide (IMODIUM) 2 MG capsule [id number] Yes Take 4 mg by mouth as needed for diarrhea or loose stools. Provider, Historical, [name] Taking Active Self  LORazepam (ATIVAN) 0.5 MG tablet [id number] No TAKE 1 TABLET BY MOUTH EVERY 8 HOURS AS NEEDED FOR ANXIETY  Patient not taking: Reported on 07/29/2023   Kayla [name], Kayla Patricia, [name]  Not Taking Active   meloxicam [facility]) 15 MG tablet [id number] Yes Take 1 tablet by mouth once daily Kayla [name], Kayla Patricia, [name] Taking Active   metFORMIN (GLUCOPHAGE-XR) 500 MG 24 hr tablet [id number] Yes TAKE 1 TABLET BY MOUTH IN THE MORNING AND 1 AT BEDTIME Kayla [name], Kayla Patricia, [name] Taking Active            Med Note Kayla [name] Jul 29, 2023  9:02 AM) 2 tablets at one time  Kayla [name] 50 MG TB24 tablet [id number] Yes Take 50 mg by mouth daily. Provider, Historical, [name] Taking Active   OVER THE COUNTER MEDICATION [id number]  Take 1 capsule by mouth daily. Vitamin [name] / Zinc Provider, Historical, [name]  Active   pantoprazole (PROTONIX) 40 MG tablet [id number] Yes Take 1 tablet by mouth once daily Kayla [name], Kayla Patricia, [name] Taking Active   rOPINIRole (REQUIP) 0.25 MG tablet [id number] Yes TAKE 1 TO 2 TABLETS BY MOUTH BEFORE BEDTIME FOR  RESTLESS  LEG  Patient taking differently: Take 0.25-0.5 mg by mouth at bedtime as needed (RLS). TAKE 1 TO 2 TABLETS BY MOUTH BEFORE BEDTIME FOR  RESTLESS  LEG   Kayla [name] Taking Active Self  sertraline (ZOLOFT) 100 MG tablet [id number] Yes Take 1 tablet by mouth once daily Kayla [name], Kayla Patricia, [name] Taking Active   Tacrolimus 0.1 % CREA [id number] Yes Apply small amount to facial rash Kayla [name], Kayla Patricia, [name] Taking Active   thyroid (ARMOUR) 30 MG tablet [id number] Yes Take 30 mg by mouth daily before breakfast. Provider, Historical, [name]  Active Self  venlafaxine (EFFEXOR) 75 MG tablet [id number] Yes Take 1 tablet by mouth once daily Kayla [name], Kayla Patricia, [name] Taking Active   vitamin B-12 (CYANOCOBALAMIN) 1000 MCG tablet [id number] Yes Take 1,000 mcg by mouth daily. Provider, Historical, [name] Taking Active Self  Med List Note Kayla [name], Kayla [name], [name] 02/20/11 2026): CPAP 9              Assessment/Plan:   Diabetes: - Currently controlled - Reviewed long term cardiovascular and renal outcomes of uncontrolled blood sugar - Reviewed  goal A1c, goal fasting, and goal 2 hour post prandial glucose - Recommend to continue medication therapy  - Recommend to check glucose at least once weekly and if experiencing symptoms of low/high BG    Follow Up Plan: Not indicated at this time, instructed patient to contact with any questions or concerns  Kayla [name], PharmD Clinical Pharmacist [phone]

## 2023-08-13 DIAGNOSIS — Z872 Personal history of diseases of the skin and subcutaneous tissue: Secondary | ICD-10-CM | POA: Diagnosis not present

## 2023-08-13 DIAGNOSIS — Z09 Encounter for follow-up examination after completed treatment for conditions other than malignant neoplasm: Secondary | ICD-10-CM | POA: Diagnosis not present

## 2023-08-13 DIAGNOSIS — D225 Melanocytic nevi of trunk: Secondary | ICD-10-CM | POA: Diagnosis not present

## 2023-08-13 DIAGNOSIS — L821 Other seborrheic keratosis: Secondary | ICD-10-CM | POA: Diagnosis not present

## 2023-08-13 DIAGNOSIS — L814 Other melanin hyperpigmentation: Secondary | ICD-10-CM | POA: Diagnosis not present

## 2023-08-16 DIAGNOSIS — G4733 Obstructive sleep apnea (adult) (pediatric): Secondary | ICD-10-CM | POA: Diagnosis not present

## 2023-08-19 ENCOUNTER — Other Ambulatory Visit: Payer: Self-pay | Admitting: Internal Medicine

## 2023-08-19 DIAGNOSIS — E119 Type 2 diabetes mellitus without complications: Secondary | ICD-10-CM

## 2023-08-20 DIAGNOSIS — E038 Other specified hypothyroidism: Secondary | ICD-10-CM | POA: Diagnosis not present

## 2023-08-21 ENCOUNTER — Other Ambulatory Visit: Payer: Self-pay | Admitting: Internal Medicine

## 2023-08-21 DIAGNOSIS — N951 Menopausal and female climacteric states: Secondary | ICD-10-CM

## 2023-08-22 DIAGNOSIS — M2550 Pain in unspecified joint: Secondary | ICD-10-CM | POA: Diagnosis not present

## 2023-08-22 DIAGNOSIS — E785 Hyperlipidemia, unspecified: Secondary | ICD-10-CM | POA: Diagnosis not present

## 2023-08-22 DIAGNOSIS — E039 Hypothyroidism, unspecified: Secondary | ICD-10-CM | POA: Diagnosis not present

## 2023-09-07 ENCOUNTER — Other Ambulatory Visit: Payer: Self-pay | Admitting: Internal Medicine

## 2023-09-07 DIAGNOSIS — F339 Major depressive disorder, recurrent, unspecified: Secondary | ICD-10-CM

## 2023-09-14 NOTE — Progress Notes (Signed)
 Subjective:    Patient ID: Kayla [name], female    DOB: [birth year], 63 y.o.   MRN: [id number]  HPI female never smoker followed for OSA/Insomnia, Asthma, allergic rhinitis complicated by HBP, depression, DM NPSG 01/30/11-AHI 19.9/hour, desaturation to 88%, CPAP titration to 9, body weight 230 pounds FENO 01/04/17- 42 H Office Spirometry 01/04/17-mild restriction of exhaled volume. FVC 2.51/78%, FEV1 2.06/82%, ratio 0.82, FEF25-75% 2.27/95% -------------------------------------------------------------------------------------------  09/14/22- 63 yo female never smoker followed for OSA/Insomnia, Restless Legs, Asthma, Allergic Rhinitis,  complicated by HBP, depression, DM2, -Albuterol  hfa, Symbicort  160, Requip  0.25, clonazepam  0.5 CPAP auto 5-15/ [employer]    AirSense 10 Autoset Download-compliance 97%, AHI 3.4 Body weight today-206 lbs Covid vax-3 Moderna Flu vax-had -----No issues with CPAP machine    ED for sinusitis in August Download reviewed.  Doing well with CPAP. Increased dyspnea on exertion with recent cold weather over the last 1 or 2 weeks.  Using Symbicort  and occasionally using rescue inhaler.  09/15/22- 63 yo female never smoker followed for OSA/Insomnia, Restless Legs, Asthma, Allergic Rhinitis,  complicated by HBP, depression, DM2, -Albuterol  hfa, Symbicort  160, Requip  0.25, clonazepam  0.5 CPAP auto 5-15/ [employer]   AirSense 10 Autoset Download-compliance - uses every night. Took second machine on recent cruise, then skipped last 3-4 nights with developing sinus infection. Body weight today-205.8 lbs -----OSA F/U visit. Pt c/o Cough with yellow sputum and Nasal Congestion for 1 week. Discussed the use of AI scribe software for clinical note transcription with the patient, who gave verbal consent to proceed.  Discussed the use of AI scribe software for clinical note transcription with the patient, who gave verbal consent to proceed.  History of Present Illness   The patient  presents with a week-long history of a cold that has progressed into a sinus infection. She describes the presence of yellow mucus and a frontal headache. She has been staying well hydrated, drinking water  regularly. She has been using a CPAP machine every night, but due to the cold, she has found it difficult to use recently. She was using a second machine on recent cruise, so download shows gap last month but she uses CPAP every night and sleeps better with it.. She is also on clonazepam  to aid sleep at night, and uses Symbicort  and albuterol  inhalers, for which she does not currently need refills.            ROS-see HPI            + = positive Constitutional:   No-   weight loss, night sweats, fevers, chills, fatigue, lassitude. HEENT:   +  headaches, difficulty swallowing, tooth/dental problems, sore throat,       No-  sneezing, itching, ear ache, +nasal congestion, post nasal drip,  CV:  No-   chest pain, orthopnea, PND, swelling in lower extremities, anasarca,  dizziness, palpitations Resp:  No hemoptysis  Skin: No-   rash or lesions. GI:  No-   heartburn, indigestion, abdominal pain, nausea, vomiting,  GU: . MS:  No-   joint pain or swelling.  . Neuro-     nothing unusual Psych:  No- change in mood or affect. No depression or anxiety.  No memory loss.  OBJ- Physical Exam General- Alert, Oriented, Affect-appropriate, Distress- none acute, + obese Skin- rash-none, lesions- none, excoriation- none Lymphadenopathy- none Head- atraumatic            Eyes- Gross vision intact, PERRLA, conjunctivae and secretions clear  Ears- Hearing, canals-normal            Nose- Clear, no-Septal dev, mucus, polyps, erosion, perforation             Throat- Mallampati III , mucosa clear , drainage- none, tonsils- atrophic Neck- flexible , trachea midline, no stridor , thyroid  nl, carotid no bruit Chest - symmetrical excursion , unlabored           Heart/CV- RRR , no murmur , no gallop  , no  rub, nl s1 s2                           - JVD- none , edema- none, stasis changes- none, varices- none           Lung- clear to P&A, wheeze- none, cough+loose , dullness-none, rub- none           Chest wall-  Abd-  Br/ Gen/ Rectal- Not done, not indicated Extrem- cyanosis- none, clubbing, none, atrophy- none, strength- nl Neuro- grossly intact to observation  Assessment and Plan    Sinusitis One week of symptoms including yellow nasal discharge and frontal headache. -Prescribe Azithromycin  (Z-Pak) and send to [facility] pharmacy in [place].  Obstructive Sleep Apnea Regular CPAP use, but difficulty with use during current acute infection. -Continue current CPAP settings. Acceptable to skip use during acute infection.  Chronic medications Clonazepam  for sleep, Symbicort  and Albuterol  inhalers for respiratory support. No refills needed at this time. -Continue current medications as prescribed.  Follow-up in 1 year or sooner if needed.

## 2023-09-16 ENCOUNTER — Ambulatory Visit: Payer: Medicare Other | Admitting: Internal Medicine

## 2023-09-16 ENCOUNTER — Encounter: Payer: Self-pay | Admitting: Internal Medicine

## 2023-09-16 VITALS — BP 130/80 | HR 70 | Ht 63.0 in | Wt 205.8 lb

## 2023-09-16 DIAGNOSIS — G4733 Obstructive sleep apnea (adult) (pediatric): Secondary | ICD-10-CM

## 2023-09-16 MED ORDER — AZITHROMYCIN 250 MG PO TABS: ORAL_TABLET | ORAL | 0 refills | Status: DC

## 2023-09-16 NOTE — Patient Instructions (Addendum)
 You are doing finr with CPAP- we can continue auto 5 - 15  Script sent for Zpak to help with sinus infection  Current inhalers are ok

## 2023-09-17 ENCOUNTER — Telehealth: Payer: Self-pay

## 2023-09-17 NOTE — Telephone Encounter (Signed)
 A COPY OF LETTER HAS BEEN SCANNED IN TO MEDIA OF CHART

## 2023-09-17 NOTE — Telephone Encounter (Signed)
 FAXING LETTER TO OFFICE TO BE REVIEW BY PROVIDER OR NURSE PLEASE HAVE THEM TO REVIEW AND FILL OUT  FOR BREZTRI(AZ&ME)   PLEASE BE ADVISED TO HAVE PROVIDER OFFICE TO FAX DIRECTLY TO COMPANY  [employer] [phone]

## 2023-10-01 ENCOUNTER — Ambulatory Visit (INDEPENDENT_AMBULATORY_CARE_PROVIDER_SITE_OTHER): Payer: Medicare Other | Admitting: Family Medicine

## 2023-10-01 ENCOUNTER — Other Ambulatory Visit: Payer: Self-pay | Admitting: Internal Medicine

## 2023-10-01 DIAGNOSIS — E7800 Pure hypercholesterolemia, unspecified: Secondary | ICD-10-CM

## 2023-10-01 DIAGNOSIS — Z0000 Encounter for general adult medical examination without abnormal findings: Secondary | ICD-10-CM

## 2023-10-01 DIAGNOSIS — I10 Essential (primary) hypertension: Secondary | ICD-10-CM

## 2023-10-01 DIAGNOSIS — E119 Type 2 diabetes mellitus without complications: Secondary | ICD-10-CM

## 2023-10-01 NOTE — Progress Notes (Signed)
PATIENT CHECK-IN and HEALTH RISK ASSESSMENT QUESTIONNAIRE:  -completed by phone/video for upcoming Medicare Preventive Visit  -PLEASE SELECT "NOT IN PERSON" for the method of visit.   Pre-Visit Check-in: 1)Vitals (height, wt, BP, etc) - record in vitals section for visit on day of visit Request home vitals (wt, BP, etc.) and enter into vitals, THEN update Vital Signs SmartPhrase below at the top of the HPI. See below.  2)Review and Update Medications, Allergies PMH, Surgeries, Social history in Epic 3)Hospitalizations in the last year with date/reason?  no  4)Review and Update Care Team (patient's specialists) in Epic 5) Complete PHQ9 in Epic  6) Complete Fall Screening in Epic 7)Review all Health Maintenance Due and order under PCP if not done.  Medicare Wellness Patient Questionnaire:  Answer theses question about your habits: How often do you have a drink containing alcohol?never Have you ever smoked? never Do you use an illicit drugs?never On average, how many days per week do you engage in moderate to strenuous exercise (like a brisk walk)? Yes, walking and plans to join the gym as well.  On average, how many minutes do you engage in exercise at this level? Usually 1 mile daily.  Diet: is working on diet, is trying to eat better, no eating as many veggies  Beverages: un-sweet tea and water  Answer theses question about your everyday activities: Can you perform most household chores?yes, but husband helps her Are you deaf or have significant trouble hearing? no Do you feel that you have a problem with memory? no Do you feel safe at home? yes Last dentist visit? Yes  8. Do you have any difficulty performing your everyday activities?no Are you having any difficulty walking, taking medications on your own, and or difficulty managing daily home needs?no Do you have difficulty walking or climbing stairs?no Do you have difficulty dressing or bathing?no Do you have difficulty doing  errands alone such as visiting a doctor's office or shopping?no Do you currently have any difficulty preparing food and eating?no Do you currently have any difficulty using the toilet?no Do you have any difficulty managing your finances?no Do you have any difficulties with housekeeping of managing your housekeeping?no - but husband helps with laundry and housework   Do you have Advanced Directives in place (Living Will, Healthcare Power or Attorney)?  no   Last eye Exam and location? Yearly, [facility] in [place]   Do you currently use prescribed or non-prescribed narcotic or opioid pain medications? no        ----------------------------------------------------------------------------------------------------------------------------------------------------------------------------------------------------------------------  Because this visit was a virtual/telehealth visit, some criteria may be missing or patient reported. Any vitals not documented were not able to be obtained and vitals that have been documented are patient reported.    MEDICARE ANNUAL PREVENTIVE VISIT WITH PROVIDER: (Welcome to [facility], initial annual wellness or annual wellness exam)  Virtual Visit via Video Note  I connected with Annakate Fasbender Vanwinkle on 10/01/23 by  a video enabled telemedicine application and verified that I am speaking with the correct person using two identifiers.  Location patient: home Location provider:work or home office Persons participating in the virtual visit: patient, provider  Concerns and/or follow up today: no concerns.    See HM section in Epic for other details of completed HM.    ROS: negative for report of fevers, unintentional weight loss, vision changes, vision loss, hearing loss or change, chest pain, sob, hemoptysis, melena, hematochezia, hematuria, falls, bleeding or bruising, thoughts of suicide or self harm,  memory loss  Patient-completed extensive health  risk assessment - reviewed and discussed with the patient: See Health Risk Assessment completed with patient prior to the visit either above or in recent phone note. This was reviewed in detailed with the patient today and appropriate recommendations, orders and referrals were placed as needed per Summary below and patient instructions.   Review of Medical History: -PMH, PSH, Family History and current specialty and care providers reviewed and updated and listed below   Patient Care Team: Philip Aspen, Limmie Patricia, MD as PCP - [facility], [facility]   Past Medical History:  Diagnosis Date   Allergy    Asthma    Carpal tunnel syndrome of right wrist 10/2012   Complication of anesthesia    hard to wake up after surg. 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" (06/05/2012); states has had no problems   GERD (gastroesophageal reflux disease)    Headache(784.0)    due to allergies   Heart murmur    states no problems   Hyperlipidemia    Hypertension    Mitral valve prolapse    OSA on CPAP    Osteoarthritis    knees   Overactive bladder    PTSD (post-traumatic stress disorder)    Sleep apnea    Type II diabetes mellitus (HCC)    NIDDM    Past Surgical History:  Procedure Laterality Date   ARTHRODESIS METATARSALPHALANGEAL JOINT (MTPJ) Right 08/24/2020   Procedure: ARTHRODESIS OF THE METATARSOPHALGEAL JOINT RIGHT FOOT;  Surgeon: Ferman Hamming, DPM;  Location: [facility];  Service: Podiatry;  Laterality: Right;   BREAST BIOPSY Left 04/25/2001   CARPAL TUNNEL RELEASE Right 10/24/2012   Procedure: CARPAL TUNNEL RELEASE;  Surgeon: Loreta Ave, MD;  Location: [facility];  Service: Orthopedics;  Laterality: Right;  RIGHT CARPAL TUNNEL RELEASE   KNEE ARTHROSCOPY Right 04/01/2008   KNEE  ARTHROSCOPY W/ MENISCECTOMY Left 08/16/2011   partial medial   LASIK     bilateral   PUBOVAGINAL SLING  08/27/2001   cysto; suprapubic tube placement   SHOULDER ARTHROSCOPY W/ ROTATOR CUFF REPAIR Left 2004   TOTAL ABDOMINAL HYSTERECTOMY  08/27/2001   TOTAL KNEE ARTHROPLASTY  02/06/2012   Procedure: TOTAL KNEE ARTHROPLASTY;  Surgeon: Loreta Ave, MD;  Location: [facility] OR;  Service: Orthopedics;  Laterality: Right;  total knee right side   TOTAL KNEE ARTHROPLASTY  06/04/2012   Procedure: TOTAL KNEE ARTHROPLASTY;  Surgeon: Loreta Ave, MD;  Location: [facility] OR;  Service: Orthopedics;  Laterality: Left;   ULNAR COLLATERAL LIGAMENT REPAIR Left 11/08/2017   Procedure: REPAIR/RECONSTRUCTION LEFT THUMB ULNAR COLLATERAL LIGAMENT;  Surgeon: Cindee Salt, MD;  Location:  SURGERY CENTER;  Service: Orthopedics;  Laterality: Left;   VAGINAL HYSTERECTOMY  1980's    Social History   Socioeconomic History   Marital status: Married    Spouse name: Not on file   Number of children: 2   Years of education: Not on file   Highest education level: Some college, no degree  Occupational History   Occupation: Chiropodist  Tobacco Use   Smoking status: Never   Smokeless tobacco: Never  Vaping Use   Vaping status: Never Used  Substance and Sexual Activity   Alcohol use: No   Drug use: No  Sexual activity: Yes  Other Topics Concern   Not on file  Social History Narrative   Not on file   Social Drivers of Health   Financial Resource Strain: Low Risk  (03/12/2022)   Overall Financial Resource Strain (CARDIA)    Difficulty of Paying Living Expenses: Not hard at all  Food Insecurity: No Food Insecurity (03/12/2022)   Hunger Vital Sign    Worried About Running Out of Food in the Last Year: Never true    Ran Out of Food in the Last Year: Never true  Transportation Needs: No Transportation Needs (03/12/2022)   PRAPARE - Administrator, Civil Service (Medical): No    Lack of  Transportation (Non-Medical): No  Physical Activity: Unknown (03/12/2022)   Exercise Vital Sign    Days of Exercise per Week: Patient declined    Minutes of Exercise per Session: Not on file  Stress: Stress Concern Present (03/12/2022)   [employer] of Occupational Health - Occupational Stress Questionnaire    Feeling of Stress : To some extent  Social Connections: Socially Integrated (03/12/2022)   Social Connection and Isolation Panel [NHANES]    Frequency of Communication with Friends and Family: More than three times a week    Frequency of Social Gatherings with Friends and Family: Once a week    Attends Religious Services: 1 to 4 times per year    Active Member of [employer] or Organizations: Yes    Attends Banker Meetings: Never    Marital Status: Married  Catering manager Violence: Not on file    Family History  Problem Relation Age of Onset   COPD Mother    Heart disease Mother    Heart disease Father    Heart attack Brother    Colon cancer Neg Hx    Stomach cancer Neg Hx    Esophageal cancer Neg Hx    Pancreatic cancer Neg Hx     Current Outpatient Medications on File Prior to Visit  Medication Sig Dispense Refill   albuterol (PROVENTIL HFA) 108 (90 Base) MCG/ACT inhaler Inhale 2 puffs into the lungs every 6 (six) hours as needed for wheezing or shortness of breath. 18 g 3   azithromycin (ZITHROMAX) 250 MG tablet 2 today then one daily 6 tablet 0   blood glucose meter kit and supplies KIT Dispense based on patient and insurance preference. Use up to four times daily as directed. 1 each 0   Budeson-Glycopyrrol-Formoterol (BREZTRI AEROSPHERE) 160-9-4.8 MCG/ACT AERO Inhale 2 puffs into the lungs in the morning and at bedtime.     clonazePAM (KLONOPIN) 0.5 MG tablet TAKE 1 TO 2 TABLETS BY MOUTH AS NEEDED FOR SLEEP 60 tablet 5   dapagliflozin propanediol (FARXIGA) 10 MG TABS tablet Take 10 mg by mouth daily.     fluticasone (FLONASE) 50 MCG/ACT nasal spray Place 1  spray into both nostrils daily. 16 g 2   loperamide (IMODIUM) 2 MG capsule Take 4 mg by mouth as needed for diarrhea or loose stools.     meloxicam (MOBIC) 15 MG tablet Take 1 tablet by mouth once daily 90 tablet 0   MYRBETRIQ 50 MG TB24 tablet Take 50 mg by mouth daily.     OVER THE COUNTER MEDICATION Take 1 capsule by mouth daily. Vitamin A / Zinc     rOPINIRole (REQUIP) 0.25 MG tablet TAKE 1 TO 2 TABLETS BY MOUTH BEFORE BEDTIME FOR  RESTLESS  LEG (Patient taking differently: Take 0.25-0.5 mg by mouth  at bedtime as needed (RLS). TAKE 1 TO 2 TABLETS BY MOUTH BEFORE BEDTIME FOR  RESTLESS  LEG) 30 tablet 12   sertraline (ZOLOFT) 100 MG tablet Take 1 tablet by mouth once daily 90 tablet 0   Tacrolimus 0.1 % CREA Apply small amount to facial rash 30 g 0   thyroid (ARMOUR) 30 MG tablet Take 30 mg by mouth daily before breakfast.     venlafaxine (EFFEXOR) 75 MG tablet Take 1 tablet by mouth once daily 90 tablet 0   vitamin B-12 (CYANOCOBALAMIN) 1000 MCG tablet Take 1,000 mcg by mouth daily.     No current facility-administered medications on file prior to visit.    Allergies  Allergen Reactions   Morphine And Codeine Shortness Of Breath and Itching   Ibuprofen Itching       Physical Exam Vitals requested from patient and listed below if patient had equipment and was able to obtain at home for this virtual visit: There were no vitals filed for this visit. Estimated body mass index is 36.46 kg/m as calculated from the following:   Height as of 09/16/23: 5\' 3"  (1.6 m).   Weight as of 09/16/23: 205 lb 12.8 oz (93.4 kg).  EKG (optional): deferred due to virtual visit  GENERAL: alert, oriented, no acute distress detected, full vision exam deferred due to pandemic and/or virtual encounter  HEENT: atraumatic, conjunttiva clear, no obvious abnormalities on inspection of external nose and ears  NECK: normal movements of the head and neck  LUNGS: on inspection no signs of respiratory distress,  breathing rate appears normal, no obvious gross SOB, gasping or wheezing  CV: no obvious cyanosis  MS: moves all visible extremities without noticeable abnormality  PSYCH/NEURO: pleasant and cooperative, no obvious depression or anxiety, speech and thought processing grossly intact, Cognitive function grossly intact  Flowsheet Row Office Visit from 05/02/2023 in [facility] [place]  PHQ-9 Total Score 2           10/01/2023    5:56 PM 05/02/2023    2:32 PM 03/27/2023   10:33 AM 12/26/2022   10:16 AM 09/26/2022    9:08 AM  Depression screen PHQ 2/9  Decreased Interest 0 0 0 0 0  Down, Depressed, Hopeless 0 0 0 0 0  PHQ - 2 Score 0 0 0 0 0  Altered sleeping  1 3 2 2   Tired, decreased energy  1 1 1 1   Change in appetite  0 1 0 0  Feeling bad or failure about yourself   0 0 0 0  Trouble concentrating  0 0 0 0  Moving slowly or fidgety/restless  0 0 0 0  Suicidal thoughts  0 0 0 0  PHQ-9 Score  2 5 3 3   Difficult doing work/chores   Somewhat difficult Not difficult at all Not difficult at all       12/26/2022   10:16 AM 03/27/2023   10:32 AM 05/02/2023    2:30 PM 10/01/2023    5:45 PM 10/01/2023    5:53 PM  Fall Risk  Falls in the past year? 0 1 1 1 1   Was there an injury with Fall? 0 0 0 0 0  Fall Risk Category Calculator 0 2 1 2  2   Patient at Risk for Falls Due to No Fall Risks    History of fall(s)  Fall risk Follow up Falls evaluation completed Falls evaluation completed Falls evaluation completed  Falls evaluation completed;Education provided;Falls prevention discussed  Patient-reported     SUMMARY AND PLAN:  Encounter for Medicare annual wellness exam   Discussed applicable health maintenance/preventive health measures and advised and referred or ordered per patient preferences: -discussed all vaccines due and she agrees will get at pharmacy if wishes to do and to update office -she agrees to schedule inperson visit with Dr. Ardyth Harps to  do labs, diabetes follow up, foot exam   Health Maintenance  Topic Date Due   HIV Screening  Never done   FOOT EXAM  05/07/2019   Pneumococcal Vaccine 41-33 Years old (3 of 3 - PPSV23 or PCV20) 08/29/2021   COVID-19 Vaccine (4 - 2024-25 season) 05/12/2023   OPHTHALMOLOGY EXAM  06/13/2023   Diabetic kidney evaluation - eGFR measurement  09/27/2023   Diabetic kidney evaluation - Urine ACR  09/27/2023   HEMOGLOBIN A1C  09/27/2023   INFLUENZA VACCINE  12/09/2023 (Originally 04/11/2023)   Medicare Annual Wellness (AWV)  09/30/2024   MAMMOGRAM  12/16/2024   DTaP/Tdap/Td (3 - Td or Tdap) 04/28/2030   Colonoscopy  11/14/2031   Hepatitis C Screening  Completed   Zoster Vaccines- Shingrix  Completed   HPV VACCINES  Aged [employer] and counseling on the following was provided based on the above review of health and a plan/checklist for the patient, along with additional information discussed, was provided for the patient in the patient instructions :  -Advised on importance of completing advanced directives, discussed options for completing and provided information in patient instructions as well -Provided counseling and plan for increased risk of falling if applicable per above screening. Reviewed and demonstrated safe balance exercises that can be done at home to improve balance and discussed exercise guidelines for adults with include balance exercises at least 3 days per week.  -Advised and counseled on a healthy lifestyle - including the importance of a healthy diet, regular physical activity, social connections and stress management. -Reviewed patient's current diet. Advised and counseled on a whole foods based healthy diet. A summary of a healthy diet was provided in the Patient Instructions.  -reviewed patient's current physical activity level and discussed exercise guidelines for adults. Discussed community resources and ideas for safe exercise at home to assist in meeting exercise  guideline recommendations in a safe and healthy way.  -Advise yearly dental visits at minimum and regular eye exams   Follow up: see patient instructions     Patient Instructions  I really enjoyed getting to talk with you today! I am available on Tuesdays and Thursdays for virtual visits if you have any questions or concerns, or if I can be of any further assistance.   CHECKLIST FROM ANNUAL WELLNESS VISIT:  -Follow up (please call to schedule if not scheduled after visit):   -call the office to schedule your follow up with Dr. Ardyth Harps in the next 1-2 months   -yearly for annual wellness visit with primary care office  Here is a list of your preventive care/health maintenance measures and the plan for each if any are due:  PLAN For any measures below that may be due:  -please schedule in office visit for diabetes follow up and request your labs and foot exam then -can get the vaccines due at the pharmacy, please let us know when you do so that we can update your record  Health Maintenance  Topic Date Due   HIV Screening  Never done   FOOT EXAM  05/07/2019   Pneumococcal Vaccine 10-90 Years old (  3 of 3 - PPSV23 or PCV20) 08/29/2021   COVID-19 Vaccine (4 - 2024-25 season) 05/12/2023   OPHTHALMOLOGY EXAM  06/13/2023   Diabetic kidney evaluation - eGFR measurement  09/27/2023   Diabetic kidney evaluation - Urine ACR  09/27/2023   HEMOGLOBIN A1C  09/27/2023   INFLUENZA VACCINE  12/09/2023 (Originally 04/11/2023)   Medicare Annual Wellness (AWV)  09/30/2024   MAMMOGRAM  12/16/2024   DTaP/Tdap/Td (3 - Td or Tdap) 04/28/2030   Colonoscopy  11/14/2031   Hepatitis C Screening  Completed   Zoster Vaccines- Shingrix  Completed   HPV VACCINES  Aged Out    -See a dentist at least yearly  -Get your eyes checked and then per your eye specialist's recommendations  -Other issues addressed today:   -I have included below further information regarding a healthy whole foods based diet,  physical activity guidelines for adults, stress management and opportunities for social connections. I hope you find this information useful.   -----------------------------------------------------------------------------------------------------------------------------------------------------------------------------------------------------------------------------------------------------------  NUTRITION: -eat real food: lots of colorful vegetables (half the plate) and fruits -5-7 servings of vegetables and fruits per day (fresh or steamed is best), exp. 2 servings of vegetables with lunch and dinner and 2 servings of fruit per day. Berries and greens such as kale and collards are great choices.  -consume on a regular basis: whole grains (make sure first ingredient on label contains the word "whole"), fresh fruits, fish, nuts, seeds, healthy oils (such as olive oil, avocado oil, grape seed oil) -may eat small amounts of dairy and lean meat on occasion, but avoid processed meats such as ham, bacon, lunch meat, etc. -drink water -try to avoid fast food and pre-packaged foods, processed meat -most experts advise limiting sodium to < 2300mg  per day, should limit further is any chronic conditions such as high blood pressure, heart disease, diabetes, etc. The American Heart Association advised that < 1500mg  is is ideal -try to avoid foods that contain any ingredients with names you do not recognize  -try to avoid sugar/sweets (except for the natural sugar that occurs in fresh fruit) -try to avoid sweet drinks -try to avoid white rice, white bread, pasta (unless whole grain), white or yellow potatoes  EXERCISE GUIDELINES FOR ADULTS: -if you wish to increase your physical activity, do so gradually and with the approval of your doctor -STOP and seek medical care immediately if you have any chest pain, chest discomfort or trouble breathing when starting or increasing exercise  -move and stretch your body,  legs, feet and arms when sitting for long periods -Physical activity guidelines for optimal health in adults: -least 150 minutes per week of aerobic exercise (can talk, but not sing) once approved by your doctor, 20-30 minutes of sustained activity or two 10 minute episodes of sustained activity every day.  -resistance training at least 2 days per week if approved by your doctor -balance exercises 3+ days per week:   Stand somewhere where you have something sturdy to hold onto if you lose balance.    1) lift up on toes, start with 5x per day and work up to 20x   2) stand and lift on leg straight out to the side so that foot is a few inches of the floor, start with 5x each side and work up to 20x each side   3) stand on one foot, start with 5 seconds each side and work up to 20 seconds on each side  If you need ideas or help with getting more  active:  -Silver sneakers https://tools.silversneakers.com  -Walk with a Doc: http://www.duncan-williams.com/  -try to include resistance (weight lifting/strength building) and balance exercises twice per week: or the following link for ideas: http://castillo-powell.com/  BuyDucts.dk  STRESS MANAGEMENT: -can try meditating, or just sitting quietly with deep breathing while intentionally relaxing all parts of your body for 5 minutes daily -if you need further help with stress, anxiety or depression please follow up with your primary doctor or contact the wonderful folks at [facility]: [phone]  SOCIAL CONNECTIONS: -options in [place] if you wish to engage in more social and exercise related activities:  -Silver sneakers https://tools.silversneakers.com  -Walk with a Doc: http://www.duncan-williams.com/  -Check out the [facility] Active Adults 50+ section on the [place] of [employer] (hiking clubs, book clubs, cards and games, chess, exercise  classes, aquatic classes and much more) - see the website for details: https://www.[place].gov/departments/parks-recreation/active-adults50  -YouTube has lots of exercise videos for different ages and abilities as well  -[facility] (a variety of indoor and outdoor inperson activities for adults). [phone]. [address].  -Virtual Online Classes (a variety of topics): see seniorplanet.org or call [phone]  -consider volunteering at a school, hospice center, church, senior center or elsewhere         ADVANCED HEALTHCARE DIRECTIVES:  [place] Advanced Directives assistance:   ExpressWeek.com.cy  Everyone should have advanced health care directives in place. This is so that you get the care you want, should you ever be in a situation where you are unable to make your own medical decisions.   From the [place] Advanced Directive Website: "Advance Health Care Directives are legal documents in which you give written instructions about your health care if, in the future, you cannot speak for yourself.   A health care power of attorney allows you to name a person you trust to make your health care decisions if you cannot make them yourself. A declaration of a desire for a natural death (or living will) is document, which states that you desire not to have your life prolonged by extraordinary measures if you have a terminal or incurable illness or if you are in a vegetative state. An advance instruction for mental health treatment makes a declaration of instructions, information and preferences regarding your mental health treatment. It also states that you are aware that the advance instruction authorizes a mental health treatment provider to act according to your wishes. It may also outline your consent or refusal of mental health treatment. A declaration of an anatomical gift allows anyone over the age of 45 to  make a gift by will, organ donor card or other document."   Please see the following website or an elder law attorney for forms, FAQs and for completion of advanced directives: [place] TEFL teacher Health Care Directives Advance Health Care Directives (http://guzman.com/)  Or copy and paste the following to your web browser: PoshChat.fi    Terressa Koyanagi, DO

## 2023-10-01 NOTE — Patient Instructions (Addendum)
I really enjoyed getting to talk with you today! I am available on Tuesdays and Thursdays for virtual visits if you have any questions or concerns, or if I can be of any further assistance.   CHECKLIST FROM ANNUAL WELLNESS VISIT:  -Follow up (please call to schedule if not scheduled after visit):   -call the office to schedule your follow up with Dr. Ardyth Harps in the next 1-2 months   -yearly for annual wellness visit with primary care office  Here is a list of your preventive care/health maintenance measures and the plan for each if any are due:  PLAN For any measures below that may be due:  -please schedule in office visit for diabetes follow up and request your labs and foot exam then -can get the vaccines due at the pharmacy, please let us know when you do so that we can update your record  Health Maintenance  Topic Date Due   HIV Screening  Never done   FOOT EXAM  05/07/2019   Pneumococcal Vaccine 50-95 Years old (3 of 3 - PPSV23 or PCV20) 08/29/2021   COVID-19 Vaccine (4 - 2024-25 season) 05/12/2023   OPHTHALMOLOGY EXAM  06/13/2023   Diabetic kidney evaluation - eGFR measurement  09/27/2023   Diabetic kidney evaluation - Urine ACR  09/27/2023   HEMOGLOBIN A1C  09/27/2023   INFLUENZA VACCINE  12/09/2023 (Originally 04/11/2023)   Medicare Annual Wellness (AWV)  09/30/2024   MAMMOGRAM  12/16/2024   DTaP/Tdap/Td (3 - Td or Tdap) 04/28/2030   Colonoscopy  11/14/2031   Hepatitis C Screening  Completed   Zoster Vaccines- Shingrix  Completed   HPV VACCINES  Aged Out    -See a dentist at least yearly  -Get your eyes checked and then per your eye specialist's recommendations  -Other issues addressed today:   -I have included below further information regarding a healthy whole foods based diet, physical activity guidelines for adults, stress management and opportunities for social connections. I hope you find this information useful.    -----------------------------------------------------------------------------------------------------------------------------------------------------------------------------------------------------------------------------------------------------------  NUTRITION: -eat real food: lots of colorful vegetables (half the plate) and fruits -5-7 servings of vegetables and fruits per day (fresh or steamed is best), exp. 2 servings of vegetables with lunch and dinner and 2 servings of fruit per day. Berries and greens such as kale and collards are great choices.  -consume on a regular basis: whole grains (make sure first ingredient on label contains the word "whole"), fresh fruits, fish, nuts, seeds, healthy oils (such as olive oil, avocado oil, grape seed oil) -may eat small amounts of dairy and lean meat on occasion, but avoid processed meats such as ham, bacon, lunch meat, etc. -drink water -try to avoid fast food and pre-packaged foods, processed meat -most experts advise limiting sodium to < 2300mg  per day, should limit further is any chronic conditions such as high blood pressure, heart disease, diabetes, etc. The American Heart Association advised that < 1500mg  is is ideal -try to avoid foods that contain any ingredients with names you do not recognize  -try to avoid sugar/sweets (except for the natural sugar that occurs in fresh fruit) -try to avoid sweet drinks -try to avoid white rice, white bread, pasta (unless whole grain), white or yellow potatoes  EXERCISE GUIDELINES FOR ADULTS: -if you wish to increase your physical activity, do so gradually and with the approval of your doctor -STOP and seek medical care immediately if you have any chest pain, chest discomfort or trouble breathing when starting or  increasing exercise  -move and stretch your body, legs, feet and arms when sitting for long periods -Physical activity guidelines for optimal health in adults: -least 150 minutes per week of  aerobic exercise (can talk, but not sing) once approved by your doctor, 20-30 minutes of sustained activity or two 10 minute episodes of sustained activity every day.  -resistance training at least 2 days per week if approved by your doctor -balance exercises 3+ days per week:   Stand somewhere where you have something sturdy to hold onto if you lose balance.    1) lift up on toes, start with 5x per day and work up to 20x   2) stand and lift on leg straight out to the side so that foot is a few inches of the floor, start with 5x each side and work up to 20x each side   3) stand on one foot, start with 5 seconds each side and work up to 20 seconds on each side  If you need ideas or help with getting more active:  -Silver sneakers https://tools.silversneakers.com  -Walk with a Doc: http://www.duncan-williams.com/  -try to include resistance (weight lifting/strength building) and balance exercises twice per week: or the following link for ideas: http://castillo-powell.com/  BuyDucts.dk  STRESS MANAGEMENT: -can try meditating, or just sitting quietly with deep breathing while intentionally relaxing all parts of your body for 5 minutes daily -if you need further help with stress, anxiety or depression please follow up with your primary doctor or contact the wonderful folks at [facility]: [phone]  SOCIAL CONNECTIONS: -options in [place] if you wish to engage in more social and exercise related activities:  -Silver sneakers https://tools.silversneakers.com  -Walk with a Doc: http://www.duncan-williams.com/  -Check out the [facility] Active Adults 50+ section on the [place] [employer] (hiking clubs, book clubs, cards and games, chess, exercise classes, aquatic classes and much more) - see the website for  details: https://www.[place].gov/departments/parks-recreation/active-adults50  -YouTube has lots of exercise videos for different ages and abilities as well  -[facility] (a variety of indoor and outdoor inperson activities for adults). [phone]. [address]  -Virtual Online Classes (a variety of topics): see seniorplanet.org or call [phone]  -consider volunteering at a school, hospice center, church, senior center or elsewhere         ADVANCED HEALTHCARE DIRECTIVES:  [place] Advanced Directives assistance:   ExpressWeek.com.cy  Everyone should have advanced health care directives in place. This is so that you get the care you want, should you ever be in a situation where you are unable to make your own medical decisions.   From the [place] Advanced Directive Website: "Advance Health Care Directives are legal documents in which you give written instructions about your health care if, in the future, you cannot speak for yourself.   A health care power of attorney allows you to name a person you trust to make your health care decisions if you cannot make them yourself. A declaration of a desire for a natural death (or living will) is document, which states that you desire not to have your life prolonged by extraordinary measures if you have a terminal or incurable illness or if you are in a vegetative state. An advance instruction for mental health treatment makes a declaration of instructions, information and preferences regarding your mental health treatment. It also states that you are aware that the advance instruction authorizes a mental health treatment provider to act according to your wishes. It may also outline  your consent or refusal of mental health treatment. A declaration of an anatomical gift allows anyone over the age of 91 to make a gift by will, organ donor card or other  document."   Please see the following website or an elder law attorney for forms, FAQs and for completion of advanced directives: [place] Health Care Directives Advance Health Care Directives (http://guzman.com/)  Or copy and paste the following to your web browser: PoshChat.fi

## 2023-10-08 ENCOUNTER — Telehealth: Payer: Self-pay

## 2023-10-08 ENCOUNTER — Encounter: Payer: Self-pay | Admitting: Internal Medicine

## 2023-10-08 ENCOUNTER — Ambulatory Visit (INDEPENDENT_AMBULATORY_CARE_PROVIDER_SITE_OTHER): Payer: Medicare Other | Admitting: Internal Medicine

## 2023-10-08 VITALS — BP 130/84 | HR 70 | Temp 98.1°F | Wt 203.9 lb

## 2023-10-08 DIAGNOSIS — E7800 Pure hypercholesterolemia, unspecified: Secondary | ICD-10-CM | POA: Diagnosis not present

## 2023-10-08 DIAGNOSIS — E559 Vitamin D deficiency, unspecified: Secondary | ICD-10-CM | POA: Diagnosis not present

## 2023-10-08 DIAGNOSIS — Z7985 Long-term (current) use of injectable non-insulin antidiabetic drugs: Secondary | ICD-10-CM

## 2023-10-08 DIAGNOSIS — E119 Type 2 diabetes mellitus without complications: Secondary | ICD-10-CM | POA: Diagnosis not present

## 2023-10-08 DIAGNOSIS — F339 Major depressive disorder, recurrent, unspecified: Secondary | ICD-10-CM

## 2023-10-08 DIAGNOSIS — I10 Essential (primary) hypertension: Secondary | ICD-10-CM

## 2023-10-08 DIAGNOSIS — Z23 Encounter for immunization: Secondary | ICD-10-CM | POA: Diagnosis not present

## 2023-10-08 DIAGNOSIS — N951 Menopausal and female climacteric states: Secondary | ICD-10-CM

## 2023-10-08 LAB — MICROALBUMIN / CREATININE URINE RATIO
Creatinine,U: 61.4 mg/dL
Microalb Creat Ratio: 1.1 mg/g (ref 0.0–30.0)
Microalb, Ur: <0.7 mg/dL (ref 0.0–1.9)

## 2023-10-08 LAB — POCT GLYCOSYLATED HEMOGLOBIN (HGB A1C): Hemoglobin A1C: 7 % — AB (ref 4.0–5.6)

## 2023-10-08 LAB — LIPID PANEL
Cholesterol: 179 mg/dL (ref 0–200)
HDL: 54.4 mg/dL (ref >39.00)
LDL Cholesterol: 92 mg/dL (ref 0–99)
NonHDL: 124.56
Total CHOL/HDL Ratio: 3
Triglycerides: 164 mg/dL — ABNORMAL HIGH (ref 0.0–149.0)
VLDL: 32.8 mg/dL (ref 0.0–40.0)

## 2023-10-08 LAB — COMPREHENSIVE METABOLIC PANEL
ALT: 16 U/L (ref 0–35)
AST: 18 U/L (ref 0–37)
Albumin: 4.7 g/dL (ref 3.5–5.2)
Alkaline Phosphatase: 110 U/L (ref 39–117)
BUN: 15 mg/dL (ref 6–23)
CO2: 28 meq/L (ref 19–32)
Calcium: 10 mg/dL (ref 8.4–10.5)
Chloride: 100 meq/L (ref 96–112)
Creatinine, Ser: 1.04 mg/dL (ref 0.40–1.20)
GFR: 56.69 mL/min — ABNORMAL LOW (ref >60.00)
Glucose, Bld: 130 mg/dL — ABNORMAL HIGH (ref 70–99)
Potassium: 3.9 meq/L (ref 3.5–5.1)
Sodium: 138 meq/L (ref 135–145)
Total Bilirubin: 0.8 mg/dL (ref 0.2–1.2)
Total Protein: 7.2 g/dL (ref 6.0–8.3)

## 2023-10-08 LAB — CBC WITH DIFFERENTIAL/PLATELET
Basophils Absolute: 0.1 10*3/uL (ref 0.0–0.1)
Basophils Relative: 0.6 % (ref 0.0–3.0)
Eosinophils Absolute: 0.3 10*3/uL (ref 0.0–0.7)
Eosinophils Relative: 3.1 % (ref 0.0–5.0)
HCT: 45.8 % (ref 36.0–46.0)
Hemoglobin: 15.2 g/dL — ABNORMAL HIGH (ref 12.0–15.0)
Lymphocytes Relative: 38 % (ref 12.0–46.0)
Lymphs Abs: 3.2 10*3/uL (ref 0.7–4.0)
MCHC: 33.2 g/dL (ref 30.0–36.0)
MCV: 85.2 fL (ref 78.0–100.0)
Monocytes Absolute: 0.7 10*3/uL (ref 0.1–1.0)
Monocytes Relative: 8.1 % (ref 3.0–12.0)
Neutro Abs: 4.2 10*3/uL (ref 1.4–7.7)
Neutrophils Relative %: 50.2 % (ref 43.0–77.0)
Platelets: 262 10*3/uL (ref 150.0–400.0)
RBC: 5.37 Mil/uL — ABNORMAL HIGH (ref 3.87–5.11)
RDW: 14.2 % (ref 11.5–15.5)
WBC: 8.5 10*3/uL (ref 4.0–10.5)

## 2023-10-08 LAB — VITAMIN D 25 HYDROXY (VIT D DEFICIENCY, FRACTURES): VITD: 61.71 ng/mL (ref 30.00–100.00)

## 2023-10-08 MED ORDER — VENLAFAXINE HCL 75 MG PO TABS: 75.0000 mg | ORAL_TABLET | Freq: Every day | ORAL | 1 refills | Status: DC

## 2023-10-08 MED ORDER — ATORVASTATIN CALCIUM 20 MG PO TABS: 20.0000 mg | ORAL_TABLET | Freq: Every day | ORAL | 1 refills | Status: DC

## 2023-10-08 MED ORDER — METFORMIN HCL ER 500 MG PO TB24: 500.0000 mg | ORAL_TABLET | Freq: Two times a day (BID) | ORAL | 1 refills | Status: DC

## 2023-10-08 MED ORDER — SERTRALINE HCL 100 MG PO TABS: 100.0000 mg | ORAL_TABLET | Freq: Every day | ORAL | 1 refills | Status: DC

## 2023-10-08 MED ORDER — LISINOPRIL 10 MG PO TABS: 10.0000 mg | ORAL_TABLET | Freq: Every day | ORAL | 1 refills | Status: DC

## 2023-10-08 MED ORDER — OZEMPIC (0.25 OR 0.5 MG/DOSE) 2 MG/3ML ~~LOC~~ SOPN: 0.5000 mg | PEN_INJECTOR | SUBCUTANEOUS | 2 refills | Status: DC

## 2023-10-08 MED ORDER — PANTOPRAZOLE SODIUM 40 MG PO TBEC: 40.0000 mg | DELAYED_RELEASE_TABLET | Freq: Every day | ORAL | 1 refills | Status: DC

## 2023-10-08 MED ORDER — MELOXICAM 15 MG PO TABS: 15.0000 mg | ORAL_TABLET | Freq: Every day | ORAL | 1 refills | Status: DC

## 2023-10-08 NOTE — Telephone Encounter (Signed)
Pharmacist is aware.

## 2023-10-08 NOTE — Progress Notes (Signed)
Established Patient Office Visit     CC/Reason for Visit: Follow-up chronic conditions   HPI: Kayla [name] is a 63 y.o. female who is coming in today for the above mentioned reasons. Past Medical History is significant for: Hypertension, hyperlipidemia, type 2 diabetes, obstructive sleep apnea, obesity, GERD, vitamin D deficiency.  Feeling well without acute concerns or complaints.  States her pharmacy had stock issues with lisinopril that she has not taken it in about a week.  Otherwise has been compliant with all medications.   Past Medical/Surgical History: Past Medical History:  Diagnosis Date   Allergy    Asthma    Carpal tunnel syndrome of right wrist 10/2012   Complication of anesthesia    hard to wake up after surg. 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" (06/05/2012); states has had no problems   GERD (gastroesophageal reflux disease)    Headache(784.0)    due to allergies   Heart murmur    states no problems   Hyperlipidemia    Hypertension    Mitral valve prolapse    OSA on CPAP    Osteoarthritis    knees   Overactive bladder    PTSD (post-traumatic stress disorder)    Sleep apnea    Type II diabetes mellitus (HCC)    NIDDM    Past Surgical History:  Procedure Laterality Date   ARTHRODESIS METATARSALPHALANGEAL JOINT (MTPJ) Right 08/24/2020   Procedure: ARTHRODESIS OF THE METATARSOPHALGEAL JOINT RIGHT FOOT;  Surgeon: Ferman Hamming, DPM;  Location: [facility];  Service: Podiatry;  Laterality: Right;   BREAST BIOPSY Left 04/25/2001   CARPAL TUNNEL RELEASE Right 10/24/2012   Procedure: CARPAL TUNNEL RELEASE;  Surgeon: Loreta Ave, MD;  Location: [facility];  Service: Orthopedics;  Laterality: Right;  RIGHT CARPAL TUNNEL RELEASE   KNEE ARTHROSCOPY Right 04/01/2008   KNEE ARTHROSCOPY W/  MENISCECTOMY Left 08/16/2011   partial medial   LASIK     bilateral   PUBOVAGINAL SLING  08/27/2001   cysto; suprapubic tube placement   SHOULDER ARTHROSCOPY W/ ROTATOR CUFF REPAIR Left 2004   TOTAL ABDOMINAL HYSTERECTOMY  08/27/2001   TOTAL KNEE ARTHROPLASTY  02/06/2012   Procedure: TOTAL KNEE ARTHROPLASTY;  Surgeon: Loreta Ave, MD;  [facility];  Service: Orthopedics;  Laterality: Right;  total knee right side   TOTAL KNEE ARTHROPLASTY  06/04/2012   Procedure: TOTAL KNEE ARTHROPLASTY;  Surgeon: Loreta Ave, MD;  Location: [facility] OR;  Service: Orthopedics;  Laterality: Left;   ULNAR COLLATERAL LIGAMENT REPAIR Left 11/08/2017   Procedure: REPAIR/RECONSTRUCTION LEFT THUMB ULNAR COLLATERAL LIGAMENT;  Surgeon: Cindee Salt, MD;  Location: [facility];  Service: Orthopedics;  Laterality: Left;   VAGINAL HYSTERECTOMY  1980's    Social History:  reports that she has never smoked. She has never used smokeless tobacco. She reports that she does not drink alcohol and does not use drugs.  Allergies: Allergies  Allergen Reactions   Morphine And Codeine Shortness Of Breath and Itching   Ibuprofen Itching    Family History:  Family History  Problem Relation Age of Onset   COPD Mother    Heart disease Mother    Heart disease Father    Heart attack Brother    Colon cancer Neg Hx    Stomach cancer  Neg Hx    Esophageal cancer Neg Hx    Pancreatic cancer Neg Hx      Current Outpatient Medications:    albuterol (PROVENTIL HFA) 108 (90 Base) MCG/ACT inhaler, Inhale 2 puffs into the lungs every 6 (six) hours as needed for wheezing or shortness of breath., Disp: 18 g, Rfl: 3   blood glucose meter kit and supplies KIT, Dispense based on patient and insurance preference. Use up to four times daily as directed., Disp: 1 each, Rfl: 0   Budeson-Glycopyrrol-Formoterol (BREZTRI AEROSPHERE) 160-9-4.8 MCG/ACT AERO, Inhale 2 puffs into the lungs in the morning and at bedtime., Disp: ,  Rfl:    clonazePAM (KLONOPIN) 0.5 MG tablet, TAKE 1 TO 2 TABLETS BY MOUTH AS NEEDED FOR SLEEP, Disp: 60 tablet, Rfl: 5   dapagliflozin propanediol (FARXIGA) 10 MG TABS tablet, Take 10 mg by mouth daily., Disp: , Rfl:    fluticasone (FLONASE) 50 MCG/ACT nasal spray, Place 1 spray into both nostrils daily., Disp: 16 g, Rfl: 2   loperamide (IMODIUM) 2 MG capsule, Take 4 mg by mouth as needed for diarrhea or loose stools., Disp: , Rfl:    MYRBETRIQ 50 MG TB24 tablet, Take 50 mg by mouth daily., Disp: , Rfl:    OVER THE COUNTER MEDICATION, Take 1 capsule by mouth daily. Vitamin A / Zinc, Disp: , Rfl:    rOPINIRole (REQUIP) 0.25 MG tablet, TAKE 1 TO 2 TABLETS BY MOUTH BEFORE BEDTIME FOR  RESTLESS  LEG (Patient taking differently: Take 0.25-0.5 mg by mouth at bedtime as needed (RLS). TAKE 1 TO 2 TABLETS BY MOUTH BEFORE BEDTIME FOR  RESTLESS  LEG), Disp: 30 tablet, Rfl: 12   Semaglutide,0.25 or 0.5MG /DOS, (OZEMPIC, 0.25 OR 0.5 MG/DOSE,) 2 MG/3ML SOPN, Inject 0.5 mg into the skin once a week., Disp: 3 mL, Rfl: 2   Tacrolimus 0.1 % CREA, Apply small amount to facial rash, Disp: 30 g, Rfl: 0   thyroid (ARMOUR) 30 MG tablet, Take 30 mg by mouth daily before breakfast., Disp: , Rfl:    vitamin B-12 (CYANOCOBALAMIN) 1000 MCG tablet, Take 1,000 mcg by mouth daily., Disp: , Rfl:    atorvastatin (LIPITOR) 20 MG tablet, Take 1 tablet (20 mg total) by mouth daily., Disp: 100 tablet, Rfl: 1   lisinopril (ZESTRIL) 10 MG tablet, Take 1 tablet (10 mg total) by mouth daily., Disp: 100 tablet, Rfl: 1   meloxicam (MOBIC) 15 MG tablet, Take 1 tablet (15 mg total) by mouth daily., Disp: 100 tablet, Rfl: 1   metFORMIN (GLUCOPHAGE-XR) 500 MG 24 hr tablet, Take 1 tablet (500 mg total) by mouth 2 (two) times daily with a meal., Disp: 200 tablet, Rfl: 1   pantoprazole (PROTONIX) 40 MG tablet, Take 1 tablet (40 mg total) by mouth daily., Disp: 100 tablet, Rfl: 1   sertraline (ZOLOFT) 100 MG tablet, Take 1 tablet (100 mg total) by  mouth daily., Disp: 100 tablet, Rfl: 1   venlafaxine (EFFEXOR) 75 MG tablet, Take 1 tablet (75 mg total) by mouth daily., Disp: 100 tablet, Rfl: 1  Review of Systems:  Negative unless indicated in HPI.   Physical Exam: Vitals:   10/08/23 1026  BP: 130/84  Pulse: 70  Temp: 98.1 F (36.7 C)  TempSrc: Oral  SpO2: 95%  Weight: 203 lb 14.4 oz (92.5 kg)    Body mass index is 36.12 kg/m.   Physical Exam   Impression and Plan:  Diabetes mellitus with coincident hypertension (HCC) -     POCT  glycosylated hemoglobin (Hb A1C) -     Microalbumin / creatinine urine ratio; Future -     Meloxicam; Take 1 tablet (15 mg total) by mouth daily.  Dispense: 100 tablet; Refill: 1 -     metFORMIN HCl ER; Take 1 tablet (500 mg total) by mouth 2 (two) times daily with a meal.  Dispense: 200 tablet; Refill: 1 -     CBC with Differential/Platelet; Future -     Comprehensive metabolic panel; Future -     Ozempic (0.25 or 0.5 MG/DOSE); Inject 0.5 mg into the skin once a week.  Dispense: 3 mL; Refill: 2  Pure hypercholesterolemia -     Atorvastatin Calcium; Take 1 tablet (20 mg total) by mouth daily.  Dispense: 100 tablet; Refill: 1 -     Lipid panel; Future  HYPERTENSION, BENIGN ESSENTIAL -     Lisinopril; Take 1 tablet (10 mg total) by mouth daily.  Dispense: 100 tablet; Refill: 1  Depression, recurrent (HCC) -     Sertraline HCl; Take 1 tablet (100 mg total) by mouth daily.  Dispense: 100 tablet; Refill: 1  Menopausal symptom -     Venlafaxine HCl; Take 1 tablet (75 mg total) by mouth daily.  Dispense: 100 tablet; Refill: 1  Vitamin D deficiency -     VITAMIN D 25 Hydroxy (Vit-D Deficiency, Fractures); Future  Immunization due  Other orders -     Pantoprazole Sodium; Take 1 tablet (40 mg total) by mouth daily.  Dispense: 100 tablet; Refill: 1   -A1c of 7.0 demonstrates fairly well-controlled diabetes.  We have decided to start semaglutide 0.5 mg weekly. -Flu vaccine in office today.    -Medication refills provided. -Suspect elevated blood pressure due to missing doses of lisinopril.  Recheck next visit.  Time spent:32 minutes reviewing chart, interviewing and examining patient and formulating plan of care.     Chaya Jan, MD [facility]

## 2023-10-08 NOTE — Telephone Encounter (Signed)
Copied from CRM [id number]. Topic: Clinical - Prescription Issue >> Oct 08, 2023  4:09 PM Sonny Dandy B wrote: Reason for CRM: [facility] pharmacy in [place], [employer], called to advise the starting dose for ozempic is incorrect and needs to be change to 0.25 once a week for two weeks to 1 month. They would like the provider to call back  to clarify the prescription at [phone]

## 2023-10-09 ENCOUNTER — Encounter: Payer: Self-pay | Admitting: Internal Medicine

## 2023-11-05 ENCOUNTER — Encounter: Payer: Self-pay | Admitting: Internal Medicine

## 2023-11-05 ENCOUNTER — Telehealth: Payer: Self-pay

## 2023-11-05 DIAGNOSIS — E119 Type 2 diabetes mellitus without complications: Secondary | ICD-10-CM

## 2023-11-05 NOTE — Progress Notes (Signed)
 Care Guide Pharmacy Note  11/05/2023 Name: Kayla [name] MRN: [id number] DOB: [birth year]  Referred By: Philip Aspen, Limmie Patricia, MD Reason for referral: Care Coordination (Outreach to schedule with Pharm d )   Kayla [name] is a 63 y.o. year old female who is a primary care patient of Philip Aspen, Limmie Patricia, MD.  Kayla [name] was referred to the pharmacist for assistance related to: DMII  Successful contact was made with the patient to discuss pharmacy services including being ready for the pharmacist to call at least 5 minutes before the scheduled appointment time and to have medication bottles and any blood pressure readings ready for review. The patient agreed to meet with the pharmacist via telephone visit on (date/time).11/11/2023  Penne Lash , RMA     Fellows  [facility], [facility] Guide  Direct Dial: [phone]  Website: [email or web address]

## 2023-11-11 ENCOUNTER — Other Ambulatory Visit (INDEPENDENT_AMBULATORY_CARE_PROVIDER_SITE_OTHER): Payer: Medicare Other

## 2023-11-11 DIAGNOSIS — E119 Type 2 diabetes mellitus without complications: Secondary | ICD-10-CM

## 2023-11-11 DIAGNOSIS — I10 Essential (primary) hypertension: Secondary | ICD-10-CM

## 2023-11-11 NOTE — Progress Notes (Signed)
   11/11/2023  Patient ID: Kayla [name], female   DOB: 07-02-1961, 63 y.o.   MRN: [id number]  Contacted patient via telephone per referral of PCP to assist with medication access for Ozempic.  Patient should qualify for Ozempic through [employer] PAP. Application submitted via the online portal, pending decision.  Sherrill Raring, PharmD Clinical Pharmacist [phone]

## 2023-11-13 ENCOUNTER — Telehealth: Payer: Self-pay

## 2023-11-13 NOTE — Progress Notes (Signed)
   11/13/2023  Patient ID: Kayla [name], female   DOB: 11/05/1960, 63 y.o.   MRN: [id number]  Contacted patient to notify her that her application for Ozempic PAP through [employer] has been approved through 09/09/24.  Patient ID: [id number]  First shipment should arrive at office within 10-14 business days and we will contact her once it has arrived. Patient voiced understanding.  Scheduled f/u phone call for 02/05/24.  Sherrill Raring, PharmD Clinical Pharmacist [phone]

## 2023-11-20 ENCOUNTER — Ambulatory Visit
Admission: RE | Admit: 2023-11-20 | Discharge: 2023-11-20 | Disposition: A | Discharge status: Home / Self Care | Source: Ambulatory Visit | Attending: Family Medicine | Admitting: Family Medicine

## 2023-11-20 VITALS — BP 130/76 | HR 81 | Temp 98.7°F | Resp 18

## 2023-11-20 DIAGNOSIS — S0501XA Injury of conjunctiva and corneal abrasion without foreign body, right eye, initial encounter: Secondary | ICD-10-CM

## 2023-11-20 MED ORDER — ERYTHROMYCIN 5 MG/GM OP OINT: TOPICAL_OINTMENT | OPHTHALMIC | 0 refills | Status: DC

## 2023-11-20 NOTE — ED Provider Notes (Signed)
 [facility]    CSN: [id number] Arrival date & time: 11/20/23  1259      History   Chief Complaint Chief Complaint  Patient presents with   Eye Problem    Red painful - Entered by patient    HPI Kayla [name] is a 63 y.o. female.   Patient presenting today with 1 day history of right eye redness, irritation, clear drainage.  States it feels like the eye has been scratched.  Denies known injury to the eye, visual change, headache, nausea, vomiting, fevers, chills.  So far not tried anything over-the-counter for symptoms.    Past Medical History:  Diagnosis Date   Allergy    Asthma    Carpal tunnel syndrome of right wrist 10/2012   Complication of anesthesia    hard to wake up after surg. 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" (06/05/2012); states has had no problems   GERD (gastroesophageal reflux disease)    Headache(784.0)    due to allergies   Heart murmur    states no problems   Hyperlipidemia    Hypertension    Mitral valve prolapse    OSA on CPAP    Osteoarthritis    knees   Overactive bladder    PTSD (post-traumatic stress disorder)    Sleep apnea    Type II diabetes mellitus (HCC)    NIDDM    Patient Active Problem List   Diagnosis Date Noted   UTI (urinary tract infection) 05/31/2022   Vitamin D deficiency 04/29/2020   Menopausal symptom 11/19/2018   Sprain and strain of metacarpophalangeal (joint) of hand 11/15/2017   Allergic asthma, mild intermittent, uncomplicated 05/12/2017   Restless leg syndrome 05/12/2017   Diabetes mellitus with coincident hypertension (HCC) 02/13/2016   Morbid obesity (HCC) 01/08/2016   Seasonal and perennial allergic rhinitis 03/21/2014   Leg weakness, bilateral 05/14/2013   Difficulty walking 05/14/2013   Obstructive sleep apnea 01/04/2011   INSOMNIA  11/10/2009   Osteoarthritis 08/10/2008   ACUTE CYSTITIS 03/16/2008   Pure hypercholesterolemia 10/31/2007   Depression, recurrent (HCC) 10/31/2007   HYPERTENSION, BENIGN ESSENTIAL 04/21/2007   SHOULDER PAIN, BILATERAL 04/21/2007    Past Surgical History:  Procedure Laterality Date   ARTHRODESIS METATARSALPHALANGEAL JOINT (MTPJ) Right 08/24/2020   Procedure: ARTHRODESIS OF THE METATARSOPHALGEAL JOINT RIGHT FOOT;  Surgeon: Ferman Hamming, DPM;  Location: [facility];  Service: Podiatry;  Laterality: Right;   BREAST BIOPSY Left 04/25/2001   CARPAL TUNNEL RELEASE Right 10/24/2012   Procedure: CARPAL TUNNEL RELEASE;  Surgeon: Loreta Ave, MD;  Location:  SURGERY CENTER;  Service: Orthopedics;  Laterality: Right;  RIGHT CARPAL TUNNEL RELEASE   KNEE ARTHROSCOPY Right 04/01/2008   KNEE ARTHROSCOPY W/ MENISCECTOMY Left 08/16/2011   partial medial   LASIK     bilateral   PUBOVAGINAL SLING  08/27/2001   cysto; suprapubic tube placement   SHOULDER ARTHROSCOPY W/ ROTATOR CUFF REPAIR Left 2004   TOTAL ABDOMINAL HYSTERECTOMY  08/27/2001   TOTAL KNEE ARTHROPLASTY  02/06/2012   Procedure: TOTAL KNEE ARTHROPLASTY;  Surgeon: Loreta Ave, MD;  Location: [facility] OR;  Service: Orthopedics;  Laterality: Right;  total knee right side   TOTAL KNEE ARTHROPLASTY  06/04/2012   Procedure: TOTAL KNEE ARTHROPLASTY;  Surgeon: Loreta Ave, MD;  Location: [facility] OR;  Service: Orthopedics;  Laterality: Left;   ULNAR COLLATERAL LIGAMENT REPAIR Left 11/08/2017   Procedure: REPAIR/RECONSTRUCTION LEFT THUMB ULNAR COLLATERAL LIGAMENT;  Surgeon: Cindee Salt, MD;  Location: [facility];  Service: Orthopedics;  Laterality: Left;   VAGINAL HYSTERECTOMY  1980's    OB History   No obstetric history on file.      Home Medications    Prior to Admission medications   Medication Sig Start Date End Date Taking? Authorizing Provider  erythromycin ophthalmic ointment Place a 1/2 inch ribbon of ointment into  the right lower eyelid BID. 11/20/23  Yes Particia Nearing, PA-C  albuterol (PROVENTIL HFA) 108 (90 Base) MCG/ACT inhaler Inhale 2 puffs into the lungs every 6 (six) hours as needed for wheezing or shortness of breath. 08/20/18   Philip Aspen, Limmie Patricia, MD  atorvastatin (LIPITOR) 20 MG tablet Take 1 tablet (20 mg total) by mouth daily. 10/08/23   Philip Aspen, Limmie Patricia, MD  blood glucose meter kit and supplies KIT Dispense based on patient and insurance preference. Use up to four times daily as directed. 04/13/21   Philip Aspen, Limmie Patricia, MD  Budeson-Glycopyrrol-Formoterol (BREZTRI AEROSPHERE) 160-9-4.8 MCG/ACT AERO Inhale 2 puffs into the lungs in the morning and at bedtime.    [provider]  clonazePAM (KLONOPIN) 0.5 MG tablet TAKE 1 TO 2 TABLETS BY MOUTH AS NEEDED FOR SLEEP 01/31/23   Young, Joni Fears D, MD  dapagliflozin propanediol (FARXIGA) 10 MG TABS tablet Take 10 mg by mouth daily.    [provider]  fluticasone (FLONASE) 50 MCG/ACT nasal spray Place 1 spray into both nostrils daily. 04/25/22   Valentino Nose, NP  lisinopril (ZESTRIL) 10 MG tablet Take 1 tablet (10 mg total) by mouth daily. 10/08/23   Philip Aspen, Limmie Patricia, MD  loperamide (IMODIUM) 2 MG capsule Take 4 mg by mouth as needed for diarrhea or loose stools.    [provider]  meloxicam (MOBIC) 15 MG tablet Take 1 tablet (15 mg total) by mouth daily. 10/08/23   Philip Aspen, Limmie Patricia, MD  metFORMIN (GLUCOPHAGE-XR) 500 MG 24 hr tablet Take 1 tablet (500 mg total) by mouth 2 (two) times daily with a meal. 10/08/23   Philip Aspen, Limmie Patricia, MD  MYRBETRIQ 50 MG TB24 tablet Take 50 mg by mouth daily. 12/18/22   [provider]  OVER THE COUNTER MEDICATION Take 1 capsule by mouth daily. Vitamin A / Zinc    [provider]  pantoprazole (PROTONIX) 40 MG tablet Take 1 tablet (40 mg total) by mouth daily. 10/08/23   Philip Aspen, Limmie Patricia, MD  rOPINIRole (REQUIP)  0.25 MG tablet TAKE 1 TO 2 TABLETS BY MOUTH BEFORE BEDTIME FOR  RESTLESS  LEG Patient taking differently: Take 0.25-0.5 mg by mouth at bedtime as needed (RLS). TAKE 1 TO 2 TABLETS BY MOUTH BEFORE BEDTIME FOR  RESTLESS  LEG 03/01/20   Young, Joni Fears D, MD  Semaglutide,0.25 or 0.5MG /DOS, (OZEMPIC, 0.25 OR 0.5 MG/DOSE,) 2 MG/3ML SOPN Inject 0.5 mg into the skin once a week. 10/08/23   Philip Aspen, Limmie Patricia, MD  sertraline (ZOLOFT) 100 MG tablet Take 1 tablet (100 mg total) by mouth daily. 10/08/23   Philip Aspen, Limmie Patricia, MD  Tacrolimus 0.1 % CREA Apply small amount to facial rash 05/02/23   Philip Aspen, Limmie Patricia, MD  thyroid (ARMOUR) 30 MG tablet Take 30 mg by mouth daily before breakfast.    [provider]  venlafaxine (EFFEXOR) 75 MG tablet Take 1  tablet (75 mg total) by mouth daily. 10/08/23   Philip Aspen, Limmie Patricia, MD  vitamin B-12 (CYANOCOBALAMIN) 1000 MCG tablet Take 1,000 mcg by mouth daily.    [provider]    Family History Family History  Problem Relation Age of Onset   COPD Mother    Heart disease Mother    Heart disease Father    Heart attack Brother    Colon cancer Neg Hx    Stomach cancer Neg Hx    Esophageal cancer Neg Hx    Pancreatic cancer Neg Hx     Social History Social History   Tobacco Use   Smoking status: Never   Smokeless tobacco: Never  Vaping Use   Vaping status: Never Used  Substance Use Topics   Alcohol use: No   Drug use: No     Allergies   Morphine and codeine and Ibuprofen   Review of Systems Review of Systems Per HPI  Physical Exam Triage Vital Signs ED Triage Vitals  Encounter Vitals Group     BP 11/20/23 1306 130/76     Systolic BP Percentile --      Diastolic BP Percentile --      Pulse Rate 11/20/23 1306 81     Resp 11/20/23 1306 18     Temp 11/20/23 1306 98.7 F (37.1 C)     Temp Source 11/20/23 1306 Oral     SpO2 11/20/23 1306 93 %     Weight --      Height --      Head Circumference  --      Peak Flow --      Pain Score 11/20/23 1309 4     Pain Loc --      Pain Education --      Exclude from Growth Chart --    No data found.  Updated Vital Signs BP 130/76 (BP Location: Right Arm)   Pulse 81   Temp 98.7 F (37.1 C) (Oral)   Resp 18   SpO2 93%   Visual Acuity Right Eye Distance:   Left Eye Distance:   Bilateral Distance:    Right Eye Near:   Left Eye Near:    Bilateral Near:     Physical Exam Vitals and nursing note reviewed.  Constitutional:      Appearance: Normal appearance. She is not ill-appearing.  HENT:     Head: Atraumatic.     Mouth/Throat:     Mouth: Mucous membranes are moist.  Eyes:     Extraocular Movements: Extraocular movements intact.     Pupils: Pupils are equal, round, and reactive to light.     Comments: Right conjunctiva diffusely erythematous, injected worse to the lower aspect.  No foreign body appreciable on exam  Cardiovascular:     Rate and Rhythm: Normal rate and regular rhythm.     Heart sounds: Normal heart sounds.  Pulmonary:     Effort: Pulmonary effort is normal.     Breath sounds: Normal breath sounds.  Musculoskeletal:        General: Normal range of motion.     Cervical back: Normal range of motion and neck supple.  Skin:    General: Skin is warm and dry.  Neurological:     Mental Status: She is alert and oriented to person, place, and time.     Motor: No weakness.     Gait: Gait normal.  Psychiatric:        Mood and Affect:  Mood normal.        Thought Content: Thought content normal.        Judgment: Judgment normal.      UC Treatments / Results  Labs (all labs ordered are listed, but only abnormal results are displayed) Labs Reviewed - No data to display  EKG   Radiology No results found.  Procedures Procedures (including critical care time)  Medications Ordered in UC Medications - No data to display  Initial Impression / Assessment and Plan / UC Course  I have reviewed the triage  vital signs and the nursing notes.  Pertinent labs & imaging results that were available during my care of the patient were reviewed by me and considered in my medical decision making (see chart for details).     Visual acuity declined today as vision intact per patient.  Suspect corneal abrasion.  Treat with Romycin ointment, cool compresses, for the counter pain relievers.  Ophthalmology follow-up recommended if not resolving.  Final Clinical Impressions(s) / UC Diagnoses   Final diagnoses:  Abrasion of right cornea, initial encounter   Discharge Instructions   None    ED Prescriptions     Medication Sig Dispense Auth. Provider   erythromycin ophthalmic ointment Place a 1/2 inch ribbon of ointment into the right lower eyelid BID. 3.5 g Particia Nearing, PA-C      PDMP not reviewed this encounter.   Particia Nearing, [place] 11/20/23 1404

## 2023-11-20 NOTE — ED Triage Notes (Signed)
 Pt reports right eye redness and pain, feels like an abrasion is on the lower lid of the eye, causing pain. Denies any injury to the eye.

## 2023-11-29 ENCOUNTER — Telehealth: Payer: Self-pay

## 2023-11-29 NOTE — Telephone Encounter (Signed)
 Attempted to inform pt on Ozmepic supply from Patient Assistant Program is ready for pick up.   Left a detail message and to call us back if have questions.

## 2023-12-03 ENCOUNTER — Telehealth: Payer: Self-pay

## 2023-12-03 ENCOUNTER — Telehealth: Payer: Self-pay | Admitting: *Deleted

## 2023-12-03 MED ORDER — DAPAGLIFLOZIN PROPANEDIOL 10 MG PO TABS: 10.0000 mg | ORAL_TABLET | Freq: Every day | ORAL | 1 refills | Status: AC

## 2023-12-03 NOTE — Telephone Encounter (Signed)
 Refill sent.

## 2023-12-03 NOTE — Telephone Encounter (Signed)
 Per [facility] requesting a new prescription for next refil to be fax to [facility]  [phone].

## 2023-12-03 NOTE — Telephone Encounter (Signed)
-----   Message from Sherrill Raring sent at 12/03/2023  1:08 PM EDT ----- Regarding: Scheryl Darter,  Received notice from [employer] that patient needs more refills of the following prescription that patient receives through PAP:  Farxiga 10mg   Take 1 tablet by mouth daily Requesting #[phone] with refills through 2025.  Pharmacy Info for E-prescribing:  [facility] - [place], [place] - [address] [phone] F: [phone]  Thank you! Sherrill Raring, PharmD Clinical Pharmacist [phone]

## 2023-12-04 ENCOUNTER — Encounter: Payer: Self-pay | Admitting: Internal Medicine

## 2023-12-04 ENCOUNTER — Other Ambulatory Visit: Payer: Self-pay | Admitting: Internal Medicine

## 2023-12-04 NOTE — Telephone Encounter (Unsigned)
 Copied from CRM [id number]. Topic: Clinical - Medication Refill >> Dec 04, 2023 11:14 AM Isabell A wrote: Most Recent Primary Care Visit:  Provider: Delano Metz H  Department: [facility]  Visit Type: PATIENT OUTREACH 60  Date: 11/11/2023  Medication: clonazePAM (KLONOPIN) 0.5 MG tablet   Has the patient contacted their pharmacy? Yes (Agent: If no, request that the patient contact the pharmacy for the refill. If patient does not wish to contact the pharmacy document the reason why and proceed with request.) (Agent: If yes, when and what did the pharmacy advise?)  Is this the correct pharmacy for this prescription? Yes If no, delete pharmacy and type the correct one.  This is the patient's preferred pharmacy:  [facility] [address], [place] - [address] [place] [address] [place] [address] Phone: [phone] Fax: [phone]   Has the prescription been filled recently? Yes  Is the patient out of the medication? No, 6 left.  Has the patient been seen for an appointment in the last year OR does the patient have an upcoming appointment? Yes  Can we respond through MyChart? Yes  Agent: Please be advised that Rx refills may take up to 3 business days. We ask that you follow-up with your pharmacy.

## 2023-12-04 NOTE — Telephone Encounter (Signed)
**Note De-identified  [place] Obfuscation** Please advise 

## 2023-12-05 ENCOUNTER — Encounter: Payer: Self-pay | Admitting: Internal Medicine

## 2023-12-05 MED ORDER — CLONAZEPAM 0.5 MG PO TABS: ORAL_TABLET | ORAL | 5 refills | Status: DC

## 2023-12-05 NOTE — Telephone Encounter (Signed)
 Clonazepam refilled

## 2023-12-18 DIAGNOSIS — E039 Hypothyroidism, unspecified: Secondary | ICD-10-CM | POA: Diagnosis not present

## 2023-12-20 DIAGNOSIS — Z1231 Encounter for screening mammogram for malignant neoplasm of breast: Secondary | ICD-10-CM | POA: Diagnosis not present

## 2023-12-20 DIAGNOSIS — Z72820 Sleep deprivation: Secondary | ICD-10-CM | POA: Diagnosis not present

## 2023-12-20 DIAGNOSIS — M2550 Pain in unspecified joint: Secondary | ICD-10-CM | POA: Diagnosis not present

## 2023-12-20 DIAGNOSIS — E039 Hypothyroidism, unspecified: Secondary | ICD-10-CM | POA: Diagnosis not present

## 2023-12-20 LAB — HM MAMMOGRAPHY

## 2024-01-06 ENCOUNTER — Encounter: Payer: Self-pay | Admitting: Internal Medicine

## 2024-01-06 ENCOUNTER — Ambulatory Visit (INDEPENDENT_AMBULATORY_CARE_PROVIDER_SITE_OTHER): Payer: Medicare Other | Admitting: Internal Medicine

## 2024-01-06 VITALS — BP 110/70 | HR 65 | Temp 98.3°F | Wt 198.3 lb

## 2024-01-06 DIAGNOSIS — I10 Essential (primary) hypertension: Secondary | ICD-10-CM | POA: Diagnosis not present

## 2024-01-06 DIAGNOSIS — K219 Gastro-esophageal reflux disease without esophagitis: Secondary | ICD-10-CM | POA: Insufficient documentation

## 2024-01-06 DIAGNOSIS — Z7985 Long-term (current) use of injectable non-insulin antidiabetic drugs: Secondary | ICD-10-CM

## 2024-01-06 DIAGNOSIS — E7800 Pure hypercholesterolemia, unspecified: Secondary | ICD-10-CM

## 2024-01-06 DIAGNOSIS — E119 Type 2 diabetes mellitus without complications: Secondary | ICD-10-CM | POA: Diagnosis not present

## 2024-01-06 LAB — POCT GLYCOSYLATED HEMOGLOBIN (HGB A1C): Hemoglobin A1C: 6.3 % — AB (ref 4.0–5.6)

## 2024-01-06 MED ORDER — SEMAGLUTIDE (1 MG/DOSE) 4 MG/3ML ~~LOC~~ SOPN: 1.0000 mg | PEN_INJECTOR | SUBCUTANEOUS | 2 refills | Status: AC

## 2024-01-06 MED ORDER — PANTOPRAZOLE SODIUM 40 MG PO TBEC: 40.0000 mg | DELAYED_RELEASE_TABLET | Freq: Two times a day (BID) | ORAL | 1 refills | Status: DC

## 2024-01-06 MED ORDER — ATORVASTATIN CALCIUM 40 MG PO TABS: 40.0000 mg | ORAL_TABLET | Freq: Every day | ORAL | 1 refills | Status: DC

## 2024-01-06 NOTE — Progress Notes (Signed)
 Established Patient Office Visit     CC/Reason for Visit: Follow-up chronic medical conditions  HPI: Kayla [name] is a 63 y.o. female who is coming in today for the above mentioned reasons. Past Medical History is significant for: Hypertension, hyperlipidemia, type 2 diabetes, GERD, obesity, OSA, vitamin D  deficiency.  She was started on Ozempic  last visit due to an A1c that was suboptimal.  She has tolerated it well.  Has noticed some increase in reflux symptoms in the late afternoon.   Past Medical/Surgical History: Past Medical History:  Diagnosis Date   Allergy     Asthma    Carpal tunnel syndrome of right wrist 10/2012   Complication of anesthesia    hard to wake up after surg. 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" (06/05/2012); states has had no problems   GERD (gastroesophageal reflux disease)    Headache(784.0)    due to allergies   Heart murmur    states no problems   Hyperlipidemia    Hypertension    Mitral valve prolapse    OSA on CPAP    Osteoarthritis    knees   Overactive bladder    PTSD (post-traumatic stress disorder)    Sleep apnea    Type II diabetes mellitus (HCC)    NIDDM    Past Surgical History:  Procedure Laterality Date   ARTHRODESIS METATARSALPHALANGEAL JOINT (MTPJ) Right 08/24/2020   Procedure: ARTHRODESIS OF THE METATARSOPHALGEAL JOINT RIGHT FOOT;  Surgeon: Iverson Market, DPM;  Location: [facility];  Service: Podiatry;  Laterality: Right;   BREAST BIOPSY Left 04/25/2001   CARPAL TUNNEL RELEASE Right 10/24/2012   Procedure: CARPAL TUNNEL RELEASE;  Surgeon: Ferd Householder, MD;  Location: [facility];  Service: Orthopedics;  Laterality: Right;  RIGHT CARPAL TUNNEL RELEASE   KNEE ARTHROSCOPY Right 04/01/2008   KNEE ARTHROSCOPY W/ MENISCECTOMY Left 08/16/2011   partial medial    LASIK     bilateral   PUBOVAGINAL SLING  08/27/2001   cysto; suprapubic tube placement   SHOULDER ARTHROSCOPY W/ ROTATOR CUFF REPAIR Left 2004   TOTAL ABDOMINAL HYSTERECTOMY  08/27/2001   TOTAL KNEE ARTHROPLASTY  02/06/2012   Procedure: TOTAL KNEE ARTHROPLASTY;  Surgeon: Ferd Householder, MD;  Location: [facility] OR;  Service: Orthopedics;  Laterality: Right;  total knee right side   TOTAL KNEE ARTHROPLASTY  06/04/2012   Procedure: TOTAL KNEE ARTHROPLASTY;  Surgeon: Ferd Householder, MD;  Location: [facility] OR;  Service: Orthopedics;  Laterality: Left;   ULNAR COLLATERAL LIGAMENT REPAIR Left 11/08/2017   Procedure: REPAIR/RECONSTRUCTION LEFT THUMB ULNAR COLLATERAL LIGAMENT;  Surgeon: Lyanne Sample, MD;  Location: [facility];  Service: Orthopedics;  Laterality: Left;   VAGINAL HYSTERECTOMY  1980's    Social History:  reports that she has never smoked. She has never used smokeless tobacco. She reports that she does not drink alcohol and does not use drugs.  Allergies: Allergies  Allergen Reactions   Morphine  And Codeine Shortness Of Breath and Itching   Ibuprofen Itching    Family History:  Family History  Problem Relation Age of Onset   COPD Mother    Heart disease Mother    Heart disease Father    Heart attack Brother    Colon cancer Neg Hx    Stomach cancer Neg Hx  Esophageal cancer Neg Hx    Pancreatic cancer Neg Hx      Current Outpatient Medications:    albuterol  (PROVENTIL  HFA) 108 (90 Base) MCG/ACT inhaler, Inhale 2 puffs into the lungs every 6 (six) hours as needed for wheezing or shortness of breath., Disp: 18 g, Rfl: 3   blood glucose meter kit and supplies KIT, Dispense based on patient and insurance preference. Use up to four times daily as directed., Disp: 1 each, Rfl: 0   Budeson-Glycopyrrol-Formoterol  (BREZTRI AEROSPHERE) 160-9-4.8 MCG/ACT AERO, Inhale 2 puffs into the lungs in the morning and at bedtime., Disp: , Rfl:    clonazePAM  (KLONOPIN ) 0.5 MG tablet, TAKE  1 TO 2 TABLETS BY MOUTH AS NEEDED FOR SLEEP, Disp: 60 tablet, Rfl: 5   dapagliflozin  propanediol (FARXIGA ) 10 MG TABS tablet, Take 1 tablet (10 mg total) by mouth daily., Disp: 90 tablet, Rfl: 1   erythromycin  ophthalmic ointment, Place a 1/2 inch ribbon of ointment into the right lower eyelid BID., Disp: 3.5 g, Rfl: 0   fluticasone  (FLONASE ) 50 MCG/ACT nasal spray, Place 1 spray into both nostrils daily., Disp: 16 g, Rfl: 2   lisinopril  (ZESTRIL ) 10 MG tablet, Take 1 tablet (10 mg total) by mouth daily., Disp: 100 tablet, Rfl: 1   loperamide (IMODIUM) 2 MG capsule, Take 4 mg by mouth as needed for diarrhea or loose stools., Disp: , Rfl:    meloxicam  (MOBIC ) 15 MG tablet, Take 1 tablet (15 mg total) by mouth daily., Disp: 100 tablet, Rfl: 1   MYRBETRIQ 50 MG TB24 tablet, Take 50 mg by mouth daily., Disp: , Rfl:    OVER THE COUNTER MEDICATION, Take 1 capsule by mouth daily. Vitamin A / Zinc, Disp: , Rfl:    rOPINIRole  (REQUIP ) 0.25 MG tablet, TAKE 1 TO 2 TABLETS BY MOUTH BEFORE BEDTIME FOR  RESTLESS  LEG (Patient taking differently: Take 0.25-0.5 mg by mouth at bedtime as needed (RLS). TAKE 1 TO 2 TABLETS BY MOUTH BEFORE BEDTIME FOR  RESTLESS  LEG), Disp: 30 tablet, Rfl: 12   Semaglutide , 1 MG/DOSE, 4 MG/3ML SOPN, Inject 1 mg as directed once a week., Disp: 3 mL, Rfl: 2   sertraline  (ZOLOFT ) 100 MG tablet, Take 1 tablet (100 mg total) by mouth daily., Disp: 100 tablet, Rfl: 1   Tacrolimus  0.1 % CREA, Apply small amount to facial rash, Disp: 30 g, Rfl: 0   thyroid  (ARMOUR) 30 MG tablet, Take 30 mg by mouth daily before breakfast., Disp: , Rfl:    venlafaxine  (EFFEXOR ) 75 MG tablet, Take 1 tablet (75 mg total) by mouth daily., Disp: 100 tablet, Rfl: 1   vitamin B-12 (CYANOCOBALAMIN ) 1000 MCG tablet, Take 1,000 mcg by mouth daily., Disp: , Rfl:    atorvastatin  (LIPITOR) 40 MG tablet, Take 1 tablet (40 mg total) by mouth daily., Disp: 90 tablet, Rfl: 1   pantoprazole  (PROTONIX ) 40 MG tablet, Take 1  tablet (40 mg total) by mouth 2 (two) times daily., Disp: 180 tablet, Rfl: 1  Review of Systems:  Negative unless indicated in HPI.   Physical Exam: Vitals:   01/06/24 0920  BP: 110/70  Pulse: 65  Temp: 98.3 F (36.8 C)  TempSrc: Oral  SpO2: 98%  Weight: 198 lb 4.8 oz (89.9 kg)    Body mass index is 35.13 kg/m.   Physical Exam Vitals reviewed.  Constitutional:      Appearance: Normal appearance. She is obese.  HENT:     Head: Normocephalic and atraumatic.  Eyes:  Conjunctiva/sclera: Conjunctivae normal.  Cardiovascular:     Rate and Rhythm: Normal rate and regular rhythm.  Pulmonary:     Effort: Pulmonary effort is normal.     Breath sounds: Normal breath sounds.  Skin:    General: Skin is warm and dry.  Neurological:     General: No focal deficit present.     Mental Status: She is alert and oriented to person, place, and time.  Psychiatric:        Mood and Affect: Mood normal.        Behavior: Behavior normal.        Thought Content: Thought content normal.        Judgment: Judgment normal.      Impression and Plan:  Diabetes mellitus with coincident hypertension (HCC) -     POCT glycosylated hemoglobin (Hb A1C) -     Semaglutide  (1 MG/DOSE); Inject 1 mg as directed once a week.  Dispense: 3 mL; Refill: 2  Pure hypercholesterolemia -     Atorvastatin  Calcium ; Take 1 tablet (40 mg total) by mouth daily.  Dispense: 90 tablet; Refill: 1  Gastroesophageal reflux disease, unspecified whether esophagitis present -     Pantoprazole  Sodium; Take 1 tablet (40 mg total) by mouth 2 (two) times daily.  Dispense: 180 tablet; Refill: 1  - A1c shows excellent diabetic control at 6.3.  Increase Ozempic  from 0.5 to 1 mg weekly to boost weight loss, discontinue metformin . - LDL is suboptimal at 92.  Increase atorvastatin  from 20 to 40 mg and recheck lipids in 3 months. - Blood pressure is well-controlled on present. - Due to increased GERD symptoms will increase  pantoprazole  from once to twice daily.   Time spent:32 minutes reviewing chart, interviewing and examining patient and formulating plan of care.     Marguerita Shih, MD [facility]

## 2024-02-05 ENCOUNTER — Other Ambulatory Visit (INDEPENDENT_AMBULATORY_CARE_PROVIDER_SITE_OTHER)

## 2024-02-05 DIAGNOSIS — I10 Essential (primary) hypertension: Secondary | ICD-10-CM

## 2024-02-05 DIAGNOSIS — E119 Type 2 diabetes mellitus without complications: Secondary | ICD-10-CM

## 2024-02-05 NOTE — Progress Notes (Signed)
 02/05/2024 Name: Kayla [name] MRN: [id number] DOB: December 17, 1960  Chief Complaint  Patient presents with   Medication Management    Kayla [name] is a 63 y.o. year old female who presented for a telephone visit.   They were referred to the pharmacist by their PCP for assistance in managing diabetes and medication access.    Subjective:  Care Team: Primary Care Provider: Zilphia Hilt, Charyl Coppersmith, MD   Medication Access/Adherence  Current Pharmacy:  [facility] [address], [place] - [address] [place] [address] [place] [address] [place] [address] Phone: [phone] Fax: [phone]  [facility] - [place], [place] - [address] [place] [address] Phone: [phone] Fax: [phone]   Patient reports affordability concerns with their medications: No  Patient reports access/transportation concerns to their pharmacy: No  Patient reports adherence concerns with their medications:  No     Diabetes:  Current medications: Farxiga  10mg , Metformin  XR 500mg  BID, Ozempic  0.5mg  (was increased to 1mg  at last PCP visit but patient never received or started) Medications tried in the past: Jardiance  (switched to farxiga  due to cost)  Current glucose readings:  Not checking regularly but has supplies to check as needed   Patient denies hypoglycemic s/sx including dizziness, shakiness, sweating. Patient denies hyperglycemic symptoms including polyuria, polydipsia, polyphagia, nocturia, neuropathy, blurred vision.  Current meal patterns:  -Mindful of carb intake  Current medication access support: Farxiga  (and Breztri) through [employer] through 09/09/24. Ozempic  through [facility]   Objective:  Lab Results  Component Value Date   HGBA1C 6.3 (A) 01/06/2024    Lab Results  Component Value Date   CREATININE 1.04 10/08/2023   BUN 15 10/08/2023   NA 138 10/08/2023   K 3.9 10/08/2023   CL 100 10/08/2023   CO2 28 10/08/2023    Lab Results   Component Value Date   CHOL 179 10/08/2023   HDL 54.40 10/08/2023   LDLCALC 92 10/08/2023   LDLDIRECT 113.3 04/21/2008   TRIG 164.0 (H) 10/08/2023   CHOLHDL 3 10/08/2023    Medications Reviewed Today     Reviewed by Carnell Christian, RPH (Pharmacist) on 02/05/24 at 1148  Med List Status: <None>   Medication Order Taking? Sig Documenting Provider Last Dose Status Informant  albuterol  (PROVENTIL  HFA) 108 (90 Base) MCG/ACT inhaler [id number] Yes Inhale 2 puffs into the lungs every 6 (six) hours as needed for wheezing or shortness of breath. Zilphia Hilt, Charyl Coppersmith, MD Taking Active Self  atorvastatin  (LIPITOR) 40 MG tablet [id number] Yes Take 1 tablet (40 mg total) by mouth daily. Zilphia Hilt, Charyl Coppersmith, MD Taking Active   blood glucose meter kit and supplies KIT [id number]  Dispense based on patient and insurance preference. Use up to four times daily as directed. Zilphia Hilt, Charyl Coppersmith, MD  Active   Budeson-Glycopyrrol-Formoterol  [facility] AEROSPHERE) 160-9-4.8 MCG/ACT Sudie Ely [id number] Yes Inhale 2 puffs into the lungs in the morning and at bedtime. [provider] Taking Active            Med Note Joaquin Mulberry Nov 11, 2023  1:50 PM) Through PAP  clonazePAM  (KLONOPIN ) 0.5 MG tablet [id number] Yes TAKE 1 TO 2 TABLETS BY MOUTH AS NEEDED FOR SLEEP Young, Clinton D, MD Taking Active   dapagliflozin  propanediol (FARXIGA ) 10 MG TABS tablet [id number] Yes Take 1 tablet (10 mg total) by mouth daily. Zilphia Hilt, Charyl Coppersmith, MD Taking Active   fluticasone  (FLONASE )  50 MCG/ACT nasal spray [id number] Yes Place 1 spray into both nostrils daily. Wilhemena Harbour, NP Taking Active   lisinopril  (ZESTRIL ) 10 MG tablet [id number] Yes Take 1 tablet (10 mg total) by mouth daily. Zilphia Hilt, Charyl Coppersmith, MD Taking Active   loperamide (IMODIUM) 2 MG capsule [id number] Yes Take 4 mg by mouth as needed for diarrhea or loose stools. [provider] Taking Active Self   meloxicam  (MOBIC ) 15 MG tablet [id number] Yes Take 1 tablet (15 mg total) by mouth daily. Zilphia Hilt, Charyl Coppersmith, MD Taking Active   MYRBETRIQ 50 MG TB24 tablet [id number]  Take 50 mg by mouth daily. [provider]  Active   OVER THE COUNTER MEDICATION [id number]  Take 1 capsule by mouth daily. Vitamin A / Zinc [provider]  Active   pantoprazole  (PROTONIX ) 40 MG tablet [id number] Yes Take 1 tablet (40 mg total) by mouth 2 (two) times daily. Zilphia Hilt, Charyl Coppersmith, MD Taking Active   rOPINIRole  (REQUIP ) 0.25 MG tablet [id number]  TAKE 1 TO 2 TABLETS BY MOUTH BEFORE BEDTIME FOR  RESTLESS  LEG  Patient taking differently: Take 0.25-0.5 mg by mouth at bedtime as needed (RLS). TAKE 1 TO 2 TABLETS BY MOUTH BEFORE BEDTIME FOR  RESTLESS  LEG   Rosa College D, MD  Active Self  Semaglutide , 1 MG/DOSE, 4 MG/3ML SOPN [id number]  Inject 1 mg as directed once a week. Zilphia Hilt, Charyl Coppersmith, MD  Active   sertraline  (ZOLOFT ) 100 MG tablet [id number] Yes Take 1 tablet (100 mg total) by mouth daily. Zilphia Hilt, Charyl Coppersmith, MD Taking Active   Tacrolimus  0.1 % CREA [id number]  Apply small amount to facial rash Zilphia Hilt, Charyl Coppersmith, MD  Active   thyroid  (ARMOUR) 30 MG tablet [id number] Yes Take 30 mg by mouth daily before breakfast. [provider] Taking Active Self  venlafaxine  (EFFEXOR ) 75 MG tablet [id number] Yes Take 1 tablet (75 mg total) by mouth daily. Zilphia Hilt, Charyl Coppersmith, MD Taking Active   vitamin B-12 (CYANOCOBALAMIN ) 1000 MCG tablet [id number] Yes Take 1,000 mcg by mouth daily. [provider] Taking Active Self  Med List Note Linder Revere, Tereso Fenton, MD 02/20/11 2026): CPAP 9              Assessment/Plan:   Diabetes: - Currently controlled - Reviewed long term cardiovascular and renal outcomes of uncontrolled blood sugar - Reviewed goal A1c, goal fasting, and goal 2 hour post prandial glucose - Recommend to increase to Ozempic  1mg  as  directed by PCP, sent in dose increase to [facility] PAP for processing. - Recommend to check glucose at least once weekly and if experiencing symptoms of low/high BG    Follow Up Plan: 1 month  Carnell Christian, PharmD Clinical Pharmacist [phone]

## 2024-02-26 DIAGNOSIS — L814 Other melanin hyperpigmentation: Secondary | ICD-10-CM | POA: Diagnosis not present

## 2024-02-26 DIAGNOSIS — Z09 Encounter for follow-up examination after completed treatment for conditions other than malignant neoplasm: Secondary | ICD-10-CM | POA: Diagnosis not present

## 2024-02-26 DIAGNOSIS — Z872 Personal history of diseases of the skin and subcutaneous tissue: Secondary | ICD-10-CM | POA: Diagnosis not present

## 2024-02-26 DIAGNOSIS — L218 Other seborrheic dermatitis: Secondary | ICD-10-CM | POA: Diagnosis not present

## 2024-02-26 DIAGNOSIS — D225 Melanocytic nevi of trunk: Secondary | ICD-10-CM | POA: Diagnosis not present

## 2024-02-26 DIAGNOSIS — L821 Other seborrheic keratosis: Secondary | ICD-10-CM | POA: Diagnosis not present

## 2024-03-02 ENCOUNTER — Encounter: Payer: Self-pay | Admitting: Internal Medicine

## 2024-03-04 ENCOUNTER — Other Ambulatory Visit (INDEPENDENT_AMBULATORY_CARE_PROVIDER_SITE_OTHER)

## 2024-03-04 DIAGNOSIS — E119 Type 2 diabetes mellitus without complications: Secondary | ICD-10-CM

## 2024-03-04 DIAGNOSIS — I10 Essential (primary) hypertension: Secondary | ICD-10-CM

## 2024-03-04 NOTE — Progress Notes (Signed)
 03/04/2024 Name: Kayla [name] MRN: [id number] DOB: 1960-10-07  Chief Complaint  Patient presents with   Medication Management   Diabetes    Kayla [name] is a 63 y.o. year old female who presented for a telephone visit.   They were referred to the pharmacist by their PCP for assistance in managing diabetes and medication access.    Subjective:  Care Team: Primary Care Provider: [name], Tully GRADE, MD   Medication Access/Adherence  Current Pharmacy:  [facility] [address] [place] - [address] [place] [address] [place] [address] [place] [address] Phone: [phone] Fax: [phone]  [facility] - [place], [place] - [address] [place] [address] Phone: [phone] Fax: [phone]   Patient reports affordability concerns with their medications: No  Patient reports access/transportation concerns to their pharmacy: No  Patient reports adherence concerns with their medications:  No     Diabetes:  Current medications: Farxiga  10mg , Metformin  XR 500mg  BID, Ozempic  1mg  (has given 2 doses) Medications tried in the past: Jardiance  (switched to farxiga  due to cost)  Current glucose readings:  Not checking regularly but has supplies to check as needed   Patient denies hypoglycemic s/sx including dizziness, shakiness, sweating. Patient denies hyperglycemic symptoms including polyuria, polydipsia, polyphagia, nocturia, neuropathy, blurred vision.  Current meal patterns:  -Mindful of carb intake  Physical Activity: None  Current medication access support: Farxiga  (and Breztri) through [employer] through 09/09/24. Ozempic  through [facility]   Objective:  Lab Results  Component Value Date   HGBA1C 6.3 (A) 01/06/2024    Lab Results  Component Value Date   CREATININE 1.04 10/08/2023   BUN 15 10/08/2023   NA 138 10/08/2023   K 3.9 10/08/2023   CL 100 10/08/2023   CO2 28 10/08/2023    Lab Results  Component Value Date    CHOL 179 10/08/2023   HDL 54.40 10/08/2023   LDLCALC 92 10/08/2023   LDLDIRECT 113.3 04/21/2008   TRIG 164.0 (H) 10/08/2023   CHOLHDL 3 10/08/2023    Medications Reviewed Today     Reviewed by Lionell Jon DEL, RPH (Pharmacist) on 03/04/24 at 1113  Med List Status: <None>   Medication Order Taking? Sig Documenting Provider Last Dose Status Informant  albuterol  (PROVENTIL  HFA) 108 (90 Base) MCG/ACT inhaler [id number] Yes Inhale 2 puffs into the lungs every 6 (six) hours as needed for wheezing or shortness of breath. [name], Tully GRADE, MD  Active Self  atorvastatin  (LIPITOR) 40 MG tablet [id number] Yes Take 1 tablet (40 mg total) by mouth daily. [name], Tully GRADE, MD  Active   blood glucose meter kit and supplies KIT [id number]  Dispense based on patient and insurance preference. Use up to four times daily as directed. [name], Tully GRADE, MD  Active   Budeson-Glycopyrrol-Formoterol  [facility] AEROSPHERE) 160-9-4.8 MCG/ACT TERESE [id number] Yes Inhale 2 puffs into the lungs in the morning and at bedtime. [provider]  Active            Med Note JANEAN JON DEL   Mon Nov 11, 2023  1:50 PM) Through PAP  clonazePAM  (KLONOPIN ) 0.5 MG tablet [id number]  TAKE 1 TO 2 TABLETS BY MOUTH AS NEEDED FOR SLEEP Young, Clinton D, MD  Active   dapagliflozin  propanediol (FARXIGA ) 10 MG TABS tablet [id number] Yes Take 1 tablet (10 mg total) by mouth daily. [name], Tully GRADE, MD  Active   fluticasone  (FLONASE ) 50 MCG/ACT nasal  spray [id number] Yes Place 1 spray into both nostrils daily. Chandra Harlene LABOR, NP  Active   lisinopril  (ZESTRIL ) 10 MG tablet [id number] Yes Take 1 tablet (10 mg total) by mouth daily. [name], Tully GRADE, MD  Active   loperamide (IMODIUM) 2 MG capsule [id number] Yes Take 4 mg by mouth as needed for diarrhea or loose stools. [provider]  Active Self  meloxicam  (MOBIC ) 15 MG tablet [id number] Yes Take 1 tablet (15 mg total) by mouth  daily. [name], Tully GRADE, MD  Active   MYRBETRIQ 50 MG TB24 tablet [id number]  Take 50 mg by mouth daily. [provider]  Active   OVER THE COUNTER MEDICATION [id number]  Take 1 capsule by mouth daily. Vitamin A / Zinc [provider]  Active   pantoprazole  (PROTONIX ) 40 MG tablet [id number]  Take 1 tablet (40 mg total) by mouth 2 (two) times daily. [name], Tully GRADE, MD  Active   rOPINIRole  (REQUIP ) 0.25 MG tablet [id number]  TAKE 1 TO 2 TABLETS BY MOUTH BEFORE BEDTIME FOR  RESTLESS  LEG  Patient taking differently: Take 0.25-0.5 mg by mouth at bedtime as needed (RLS). TAKE 1 TO 2 TABLETS BY MOUTH BEFORE BEDTIME FOR  RESTLESS  LEG   Neysa Rama D, MD  Active Self  Semaglutide , 1 MG/DOSE, 4 MG/3ML SOPN [id number]  Inject 1 mg as directed once a week. [name], Tully GRADE, MD  Active   sertraline  (ZOLOFT ) 100 MG tablet [id number]  Take 1 tablet (100 mg total) by mouth daily. [name], Tully GRADE, MD  Active   Tacrolimus  0.1 % CREA [id number]  Apply small amount to facial rash [name], Tully GRADE, MD  Active   thyroid  (ARMOUR) 30 MG tablet [id number]  Take 30 mg by mouth daily before breakfast. [provider]  Active Self  venlafaxine  (EFFEXOR ) 75 MG tablet [id number]  Take 1 tablet (75 mg total) by mouth daily. [name], Tully GRADE, MD  Active   vitamin B-12 (CYANOCOBALAMIN ) 1000 MCG tablet [id number]  Take 1,000 mcg by mouth daily. [provider]  Active Self  Med List Note Vernel, Rama BIRCH, MD 02/20/11 2026): CPAP 9              Assessment/Plan:   Diabetes: - Currently controlled - Reviewed long term cardiovascular and renal outcomes of uncontrolled blood sugar - Reviewed goal A1c, goal fasting, and goal 2 hour post prandial glucose - Continue current medication therapy -Counseled on low carb diet and to establish an exercise routine for desired weight loss goals - Recommend to check glucose at least once  weekly and if experiencing symptoms of low/high BG -Scheduled 3 month follow up with PCP for lipid recheck    Follow Up Plan: as indicated after PCP visit  Jon VEAR Lindau, PharmD Clinical Pharmacist [phone]

## 2024-03-10 DIAGNOSIS — E039 Hypothyroidism, unspecified: Secondary | ICD-10-CM | POA: Diagnosis not present

## 2024-03-12 ENCOUNTER — Telehealth: Payer: Self-pay | Admitting: *Deleted

## 2024-03-12 NOTE — Telephone Encounter (Signed)
 Ozempic  2 mg/3ml 4 pens - samples from patient assistance is available.  Left message on machine for patient.

## 2024-04-06 ENCOUNTER — Ambulatory Visit: Admitting: Internal Medicine

## 2024-04-06 DIAGNOSIS — I10 Essential (primary) hypertension: Secondary | ICD-10-CM

## 2024-04-13 ENCOUNTER — Encounter: Payer: Self-pay | Admitting: Internal Medicine

## 2024-04-13 ENCOUNTER — Ambulatory Visit: Payer: Self-pay | Admitting: Internal Medicine

## 2024-04-13 ENCOUNTER — Ambulatory Visit: Admitting: Internal Medicine

## 2024-04-13 VITALS — BP 120/80 | HR 64 | Temp 98.2°F | Wt 186.7 lb

## 2024-04-13 DIAGNOSIS — E119 Type 2 diabetes mellitus without complications: Secondary | ICD-10-CM

## 2024-04-13 DIAGNOSIS — E7800 Pure hypercholesterolemia, unspecified: Secondary | ICD-10-CM

## 2024-04-13 DIAGNOSIS — K219 Gastro-esophageal reflux disease without esophagitis: Secondary | ICD-10-CM

## 2024-04-13 DIAGNOSIS — F339 Major depressive disorder, recurrent, unspecified: Secondary | ICD-10-CM

## 2024-04-13 DIAGNOSIS — I10 Essential (primary) hypertension: Secondary | ICD-10-CM

## 2024-04-13 LAB — LIPID PANEL
Cholesterol: 154 mg/dL (ref 0–200)
HDL: 46.3 mg/dL (ref >39.00)
LDL Cholesterol: 78 mg/dL (ref 0–99)
NonHDL: 107.69
Total CHOL/HDL Ratio: 3
Triglycerides: 146 mg/dL (ref 0.0–149.0)
VLDL: 29.2 mg/dL (ref 0.0–40.0)

## 2024-04-13 LAB — POCT GLYCOSYLATED HEMOGLOBIN (HGB A1C): Hemoglobin A1C: 5.8 % — AB (ref 4.0–5.6)

## 2024-04-13 MED ORDER — ATORVASTATIN CALCIUM 80 MG PO TABS: 80.0000 mg | ORAL_TABLET | Freq: Every day | ORAL | 1 refills | Status: DC

## 2024-04-13 NOTE — Progress Notes (Signed)
 Established Patient Office Visit     CC/Reason for Visit: Follow-up chronic conditions  HPI: Kayla [name] is a 63 y.o. female who is coming in today for the above mentioned reasons. Past Medical History is significant for: Hypertension, hyperlipidemia, type 2 diabetes, GERD, obesity, vitamin D  deficiency.  She is feeling well without acute concerns or complaints.   Past Medical/Surgical History: Past Medical History:  Diagnosis Date   Allergy     Asthma    Carpal tunnel syndrome of right wrist 10/2012   Complication of anesthesia    hard to wake up after surg. 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HCC)    prothrombin gene factor II (06/05/2012); states has had no problems   GERD (gastroesophageal reflux disease)    Headache(784.0)    due to allergies   Heart murmur    states no problems   Hyperlipidemia    Hypertension    Mitral valve prolapse    OSA on CPAP    Osteoarthritis    knees   Overactive bladder    PTSD (post-traumatic stress disorder)    Sleep apnea    Type II diabetes mellitus (HCC)    NIDDM    Past Surgical History:  Procedure Laterality Date   ARTHRODESIS METATARSALPHALANGEAL JOINT (MTPJ) Right 08/24/2020   Procedure: ARTHRODESIS OF THE METATARSOPHALGEAL JOINT RIGHT FOOT;  Surgeon: Blinda Katz, DPM;  Location: [facility];  Service: Podiatry;  Laterality: Right;   BREAST BIOPSY Left 04/25/2001   CARPAL TUNNEL RELEASE Right 10/24/2012   Procedure: CARPAL TUNNEL RELEASE;  Surgeon: Toribio JULIANNA Chancy, MD;  Location: [facility];  Service: Orthopedics;  Laterality: Right;  RIGHT CARPAL TUNNEL RELEASE   KNEE ARTHROSCOPY Right 04/01/2008   KNEE ARTHROSCOPY W/ MENISCECTOMY Left 08/16/2011   partial medial   LASIK     bilateral   PUBOVAGINAL SLING  08/27/2001   cysto; suprapubic tube placement   SHOULDER ARTHROSCOPY W/ ROTATOR  CUFF REPAIR Left 2004   TOTAL ABDOMINAL HYSTERECTOMY  08/27/2001   TOTAL KNEE ARTHROPLASTY  02/06/2012   Procedure: TOTAL KNEE ARTHROPLASTY;  Surgeon: Toribio JULIANNA Chancy, MD;  Location[facility] OR;  Service: Orthopedics;  Laterality: Right;  total knee right side   TOTAL KNEE ARTHROPLASTY  06/04/2012   Procedure: TOTAL KNEE ARTHROPLASTY;  Surgeon: Toribio JULIANNA Chancy, MD;  Location: [facility] OR;  Service: Orthopedics;  Laterality: Left;   ULNAR COLLATERAL LIGAMENT REPAIR Left 11/08/2017   Procedure: REPAIR/RECONSTRUCTION LEFT THUMB ULNAR COLLATERAL LIGAMENT;  Surgeon: Murrell Kuba, MD;  Location: [facility];  Service: Orthopedics;  Laterality: Left;   VAGINAL HYSTERECTOMY  1980's    Social History:  reports that she has never smoked. She has never used smokeless tobacco. She reports that she does not drink alcohol and does not use drugs.  Allergies: Allergies  Allergen Reactions   Morphine  And Codeine Shortness Of Breath and Itching   Ibuprofen Itching    Family History:  Family History  Problem Relation Age of Onset   COPD Mother    Heart disease Mother    Heart disease Father    Heart attack Brother    Colon cancer Neg Hx    Stomach cancer Neg Hx    Esophageal cancer Neg Hx    Pancreatic cancer Neg Hx      Current Outpatient Medications:    albuterol  (PROVENTIL   HFA) 108 (90 Base) MCG/ACT inhaler, Inhale 2 puffs into the lungs every 6 (six) hours as needed for wheezing or shortness of breath., Disp: 18 g, Rfl: 3   atorvastatin  (LIPITOR) 40 MG tablet, Take 1 tablet (40 mg total) by mouth daily., Disp: 90 tablet, Rfl: 1   blood glucose meter kit and supplies KIT, Dispense based on patient and insurance preference. Use up to four times daily as directed., Disp: 1 each, Rfl: 0   Budeson-Glycopyrrol-Formoterol  (BREZTRI AEROSPHERE) 160-9-4.8 MCG/ACT AERO, Inhale 2 puffs into the lungs in the morning and at bedtime., Disp: , Rfl:    clonazePAM  (KLONOPIN ) 0.5 MG tablet, TAKE 1 TO 2 TABLETS  BY MOUTH AS NEEDED FOR SLEEP, Disp: 60 tablet, Rfl: 5   dapagliflozin  propanediol (FARXIGA ) 10 MG TABS tablet, Take 1 tablet (10 mg total) by mouth daily., Disp: 90 tablet, Rfl: 1   fluticasone  (FLONASE ) 50 MCG/ACT nasal spray, Place 1 spray into both nostrils daily., Disp: 16 g, Rfl: 2   lisinopril  (ZESTRIL ) 10 MG tablet, Take 1 tablet (10 mg total) by mouth daily., Disp: 100 tablet, Rfl: 1   loperamide (IMODIUM) 2 MG capsule, Take 4 mg by mouth as needed for diarrhea or loose stools., Disp: , Rfl:    meloxicam  (MOBIC ) 15 MG tablet, Take 1 tablet (15 mg total) by mouth daily., Disp: 100 tablet, Rfl: 1   MYRBETRIQ 50 MG TB24 tablet, Take 50 mg by mouth daily., Disp: , Rfl:    OVER THE COUNTER MEDICATION, Take 1 capsule by mouth daily. Vitamin A / Zinc, Disp: , Rfl:    pantoprazole  (PROTONIX ) 40 MG tablet, Take 1 tablet (40 mg total) by mouth 2 (two) times daily., Disp: 180 tablet, Rfl: 1   rOPINIRole  (REQUIP ) 0.25 MG tablet, TAKE 1 TO 2 TABLETS BY MOUTH BEFORE BEDTIME FOR  RESTLESS  LEG (Patient taking differently: Take 0.25-0.5 mg by mouth at bedtime as needed (RLS). TAKE 1 TO 2 TABLETS BY MOUTH BEFORE BEDTIME FOR  RESTLESS  LEG), Disp: 30 tablet, Rfl: 12   Semaglutide , 1 MG/DOSE, 4 MG/3ML SOPN, Inject 1 mg as directed once a week., Disp: 3 mL, Rfl: 2   sertraline  (ZOLOFT ) 100 MG tablet, Take 1 tablet (100 mg total) by mouth daily., Disp: 100 tablet, Rfl: 1   Tacrolimus  0.1 % CREA, Apply small amount to facial rash, Disp: 30 g, Rfl: 0   thyroid  (ARMOUR) 30 MG tablet, Take 30 mg by mouth daily before breakfast., Disp: , Rfl:    venlafaxine  (EFFEXOR ) 75 MG tablet, Take 1 tablet (75 mg total) by mouth daily., Disp: 100 tablet, Rfl: 1   vitamin B-12 (CYANOCOBALAMIN ) 1000 MCG tablet, Take 1,000 mcg by mouth daily., Disp: , Rfl:   Review of Systems:  Negative unless indicated in HPI.   Physical Exam: Vitals:   04/13/24 1021  BP: 120/80  Pulse: 64  Temp: 98.2 F (36.8 C)  TempSrc: Oral  SpO2:  94%  Weight: 186 lb 11.2 oz (84.7 kg)    Body mass index is 33.07 kg/m.   Physical Exam Vitals reviewed.  Constitutional:      Appearance: Normal appearance.  HENT:     Head: Normocephalic and atraumatic.  Eyes:     Conjunctiva/sclera: Conjunctivae normal.  Cardiovascular:     Rate and Rhythm: Normal rate and regular rhythm.  Pulmonary:     Effort: Pulmonary effort is normal.     Breath sounds: Normal breath sounds.  Skin:    General: Skin is warm and  dry.  Neurological:     General: No focal deficit present.     Mental Status: She is alert and oriented to person, place, and time.  Psychiatric:        Mood and Affect: Mood normal.        Behavior: Behavior normal.        Thought Content: Thought content normal.        Judgment: Judgment normal.      Impression and Plan:  Diabetes mellitus with coincident hypertension (HCC) -     POCT glycosylated hemoglobin (Hb A1C)  Pure hypercholesterolemia -     Lipid panel; Future  Depression, recurrent (HCC)  HYPERTENSION, BENIGN ESSENTIAL  Morbid obesity (HCC)  Gastroesophageal reflux disease, unspecified whether esophagitis present   - Diabetes is very well-controlled with an A1c of 5.8. - Blood pressure is well-controlled on current. - Congratulated on weight loss success thus far. - Mood is stable. - Check lipids, consider increasing statin if not yet at goal.  Time spent:31 minutes reviewing chart, interviewing and examining patient and formulating plan of care.     Tully [name], MD [facility]

## 2024-04-28 ENCOUNTER — Encounter: Payer: Self-pay | Admitting: Internal Medicine

## 2024-04-28 ENCOUNTER — Telehealth: Payer: Self-pay

## 2024-04-28 NOTE — Telephone Encounter (Signed)
 Confirmed with patient that she picked up and received 4 boxes of Ozempic  1mg  on 02/27/24 from office and is not out of the correct dose. Unsure why company sent 0.25mg  this month.  Can you contact [facility] this week to make sure that any refills remaining on ozempic  0.14m/0.5mg  pen are cancelled and that patient is only enrolled for the 1mg  going forward?  Thank you! Jon VEAR Lindau, PharmD Clinical Pharmacist [phone]

## 2024-04-28 NOTE — Telephone Encounter (Signed)
 Gave [facility] a call to follow up [facility] [place] request pt should be receiving Ozempic  1 mg not 0.25 mg to cancel OZEMPIC  0.25 MG  pt should be on Ozempic  1 mg, spoke with representative at [facility] said pt is going to need a refill reorder form for Ozempic  1 mg IS DUE ON 05/10/24 and will cancel the 0.25 mg,I have faxed reorder form to provider office this can be fax to [employer] or can fax back to [phone].

## 2024-04-29 NOTE — Telephone Encounter (Signed)
 Reorder form faxed into [facility] for the 1mg . Thank you!

## 2024-04-30 ENCOUNTER — Telehealth: Payer: Self-pay

## 2024-04-30 NOTE — Telephone Encounter (Signed)
 CMN received from [facility] for CPAP supplies.  Placed on Dr. Sheryll desk in C POD.  Once signed, will fax to [place] at [phone].

## 2024-05-01 ENCOUNTER — Other Ambulatory Visit: Payer: Self-pay | Admitting: Internal Medicine

## 2024-05-01 DIAGNOSIS — I10 Essential (primary) hypertension: Secondary | ICD-10-CM

## 2024-05-01 NOTE — Telephone Encounter (Signed)
 Form signed by Dr. Neysa and faxed back to [facility]

## 2024-05-05 NOTE — Progress Notes (Signed)
   05/05/2024  Patient ID: Kayla [name], female   DOB: December 15, 1960, 63 y.o.   MRN: [id number]  Pharmacy Quality Measure Review  This patient is appearing on a report for being at risk of failing the adherence measure for hypertension (ACEi/ARB) medications this calendar year.   Medication: Lisinopril  10mg  Last fill date: 05/01/24 for 100 day supply  Insurance report was not up to date. No action needed at this time.   Jon VEAR Lindau, PharmD Clinical Pharmacist [phone]

## 2024-05-06 ENCOUNTER — Telehealth: Payer: Self-pay

## 2024-05-06 NOTE — Telephone Encounter (Signed)
 Received a letter from [employer] requesting a refill reorder on Ozempic ,faxed fill application to provider office to sign and date,can be faxed to [facility] or fax back to [phone].

## 2024-05-06 NOTE — Telephone Encounter (Signed)
 Gave [facility] a call to follow up on Ozempic  checking to see if [facility] have received pt's re-order refill,per representative they received the refill reorder form on ozempic , but can start the process until 05/10/24 this when the pt is going to be due and wait 10-14 days to be deliver.

## 2024-05-13 DIAGNOSIS — E039 Hypothyroidism, unspecified: Secondary | ICD-10-CM | POA: Diagnosis not present

## 2024-05-15 DIAGNOSIS — E039 Hypothyroidism, unspecified: Secondary | ICD-10-CM | POA: Diagnosis not present

## 2024-05-15 DIAGNOSIS — Z72820 Sleep deprivation: Secondary | ICD-10-CM | POA: Diagnosis not present

## 2024-05-27 ENCOUNTER — Telehealth: Payer: Self-pay

## 2024-05-27 NOTE — Telephone Encounter (Signed)
 Gave [facility] a call to follow up on [facility] [place] request she keeps receiving refill reorder form for Ozempic  0.25, pt is now on Ozempic  1mg ,spoke with [facility] representative explain pt is now receiving Ozempic  1 mg  and has mail out on 9/5 and will make a note pt is no longer using Ozempic  0.25.

## 2024-06-01 ENCOUNTER — Other Ambulatory Visit: Payer: Self-pay | Admitting: Internal Medicine

## 2024-06-01 DIAGNOSIS — E119 Type 2 diabetes mellitus without complications: Secondary | ICD-10-CM

## 2024-06-21 ENCOUNTER — Other Ambulatory Visit: Payer: Self-pay | Admitting: Internal Medicine

## 2024-06-21 DIAGNOSIS — E119 Type 2 diabetes mellitus without complications: Secondary | ICD-10-CM

## 2024-06-21 DIAGNOSIS — N951 Menopausal and female climacteric states: Secondary | ICD-10-CM

## 2024-06-23 ENCOUNTER — Telehealth: Payer: Self-pay

## 2024-06-23 NOTE — Progress Notes (Signed)
   06/23/2024  Patient ID: Kayla [name], female   DOB: Oct 12, 1960, 63 y.o.   MRN: [id number]  Received fax notification from [employer] that patient has been conditionally approved to receive Farxiga  and Breztri for 2026. In order to get full approval for 2026 and continue to receive product, patient needs to provide consent that they acknowledge the terms and conditions of the program.   This can be done in the following ways: 1) Patient can go to [email or web address] to provide consent via digital assistant 2) Call [phone] to provide verbal consent 3) Complete the paper application   [employer] Patient ID: [id number]   Forwarding to CPhT to assist with enrollment process for patient.   Kayla [name], PharmD Clinical Pharmacist [phone]

## 2024-06-23 NOTE — Telephone Encounter (Signed)
 Gave pt a call pt is pre-approved for [employer] Farxiga  and breztri, pt needs to call [employer] and give consent for 2026 re-enrollment,left a HIPAA VM.

## 2024-06-24 ENCOUNTER — Other Ambulatory Visit: Payer: Self-pay | Admitting: Internal Medicine

## 2024-06-24 DIAGNOSIS — N951 Menopausal and female climacteric states: Secondary | ICD-10-CM

## 2024-06-24 DIAGNOSIS — I10 Essential (primary) hypertension: Secondary | ICD-10-CM

## 2024-06-30 DIAGNOSIS — L218 Other seborrheic dermatitis: Secondary | ICD-10-CM | POA: Diagnosis not present

## 2024-06-30 NOTE — Progress Notes (Signed)
 Kayla [name]                                          MRN: [id number]   06/30/2024   The [facility] Quality Team Specialist reviewed this patient medical record for the purposes of chart review for care gap closure. The following were reviewed: chart review for care gap closure-diabetic eye exam.    [facility] Quality Team

## 2024-07-05 ENCOUNTER — Other Ambulatory Visit: Payer: Self-pay | Admitting: Internal Medicine

## 2024-07-05 DIAGNOSIS — E7800 Pure hypercholesterolemia, unspecified: Secondary | ICD-10-CM

## 2024-07-05 DIAGNOSIS — K219 Gastro-esophageal reflux disease without esophagitis: Secondary | ICD-10-CM

## 2024-07-22 ENCOUNTER — Other Ambulatory Visit: Payer: Self-pay | Admitting: Internal Medicine

## 2024-07-22 NOTE — Telephone Encounter (Signed)
 Pt requesting refill of controlled medication. Please advise, thank you!   LOV: 09/16/23

## 2024-07-22 NOTE — Telephone Encounter (Signed)
 Clonazepam refilled

## 2024-07-31 ENCOUNTER — Telehealth: Payer: Self-pay

## 2024-07-31 NOTE — Telephone Encounter (Signed)
 Received a refill reorder form from [facility] on [facility]  filled and faxed to provider office to sign and date.can be faxe to [facility] or fax to [phone]

## 2024-08-12 ENCOUNTER — Encounter: Admitting: Internal Medicine

## 2024-08-12 DIAGNOSIS — I10 Essential (primary) hypertension: Secondary | ICD-10-CM

## 2024-08-12 DIAGNOSIS — E7800 Pure hypercholesterolemia, unspecified: Secondary | ICD-10-CM

## 2024-08-12 DIAGNOSIS — E559 Vitamin D deficiency, unspecified: Secondary | ICD-10-CM

## 2024-08-12 DIAGNOSIS — Z0000 Encounter for general adult medical examination without abnormal findings: Secondary | ICD-10-CM

## 2024-08-12 DIAGNOSIS — E119 Type 2 diabetes mellitus without complications: Secondary | ICD-10-CM

## 2024-08-17 ENCOUNTER — Ambulatory Visit: Admitting: Internal Medicine

## 2024-08-17 ENCOUNTER — Encounter: Payer: Self-pay | Admitting: Internal Medicine

## 2024-08-17 VITALS — BP 120/80 | HR 95 | Ht 63.0 in

## 2024-08-17 DIAGNOSIS — U071 COVID-19: Secondary | ICD-10-CM

## 2024-08-17 LAB — POC COVID19 BINAXNOW: SARS Coronavirus 2 Ag: POSITIVE — AB

## 2024-08-17 MED ORDER — NIRMATRELVIR/RITONAVIR (PAXLOVID)TABLET: 3.0000 | ORAL_TABLET | Freq: Two times a day (BID) | ORAL | 0 refills | Status: AC

## 2024-08-17 NOTE — Progress Notes (Signed)
 Established Patient Office Visit     CC/Reason for Visit: URI symptoms  HPI: Kayla [name] is a 63 y.o. female who is coming in today for the above mentioned reasons.  For the past 2 days has been having runny nose congestion, sore throat and postnasal drip.  Her daughter has tested positive for COVID.   Past Medical/Surgical History: Past Medical History:  Diagnosis Date   Allergy     Asthma    Carpal tunnel syndrome of right wrist 10/2012   Complication of anesthesia    hard to wake up after surg. 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HCC)    prothrombin gene factor II (06/05/2012); states has had no problems   GERD (gastroesophageal reflux disease)    Headache(784.0)    due to allergies   Heart murmur    states no problems   Hyperlipidemia    Hypertension    Mitral valve prolapse    OSA on CPAP    Osteoarthritis    knees   Overactive bladder    PTSD (post-traumatic stress disorder)    Sleep apnea    Type II diabetes mellitus (HCC)    NIDDM    Past Surgical History:  Procedure Laterality Date   ARTHRODESIS METATARSALPHALANGEAL JOINT (MTPJ) Right 08/24/2020   Procedure: ARTHRODESIS OF THE METATARSOPHALGEAL JOINT RIGHT FOOT;  Surgeon: Blinda Katz, DPM;  Location: [facility];  Service: Podiatry;  Laterality: Right;   BREAST BIOPSY Left 04/25/2001   CARPAL TUNNEL RELEASE Right 10/24/2012   Procedure: CARPAL TUNNEL RELEASE;  Surgeon: Toribio JULIANNA Chancy, MD;  Location: [facility];  Service: Orthopedics;  Laterality: Right;  RIGHT CARPAL TUNNEL RELEASE   KNEE ARTHROSCOPY Right 04/01/2008   KNEE ARTHROSCOPY W/ MENISCECTOMY Left 08/16/2011   partial medial   LASIK     bilateral   PUBOVAGINAL SLING  08/27/2001   cysto; suprapubic tube placement   SHOULDER ARTHROSCOPY W/ ROTATOR CUFF REPAIR Left 2004   TOTAL ABDOMINAL HYSTERECTOMY  08/27/2001    TOTAL KNEE ARTHROPLASTY  02/06/2012   Procedure: TOTAL KNEE ARTHROPLASTY;  Surgeon: Toribio JULIANNA Chancy, MD;  Location: [facility] OR;  Service: Orthopedics;  Laterality: Right;  total knee right side   TOTAL KNEE ARTHROPLASTY  06/04/2012   Procedure: TOTAL KNEE ARTHROPLASTY;  Surgeon: Toribio JULIANNA Chancy, MD;  Location: [facility] OR;  Service: Orthopedics;  Laterality: Left;   ULNAR COLLATERAL LIGAMENT REPAIR Left 11/08/2017   Procedure: REPAIR/RECONSTRUCTION LEFT THUMB ULNAR COLLATERAL LIGAMENT;  Surgeon: Murrell Kuba, MD;  Location: [facility];  Service: Orthopedics;  Laterality: Left;   VAGINAL HYSTERECTOMY  1980's    Social History:  reports that she has never smoked. She has never used smokeless tobacco. She reports that she does not drink alcohol and does not use drugs.  Allergies: Allergies  Allergen Reactions   Morphine  And Codeine Shortness Of Breath and Itching   Ibuprofen Itching    Family History:  Family History  Problem Relation Age of Onset   COPD Mother    Heart disease Mother    Heart disease Father    Heart attack Brother    Colon cancer Neg Hx    Stomach cancer Neg Hx    Esophageal cancer Neg Hx    Pancreatic cancer Neg Hx      Current Outpatient Medications:    albuterol  (PROVENTIL  HFA) 108 (  90 Base) MCG/ACT inhaler, Inhale 2 puffs into the lungs every 6 (six) hours as needed for wheezing or shortness of breath., Disp: 18 g, Rfl: 3   atorvastatin  (LIPITOR) 40 MG tablet, Take 1 tablet by mouth once daily, Disp: 90 tablet, Rfl: 1   atorvastatin  (LIPITOR) 80 MG tablet, Take 1 tablet (80 mg total) by mouth daily., Disp: 90 tablet, Rfl: 1   blood glucose meter kit and supplies KIT, Dispense based on patient and insurance preference. Use up to four times daily as directed., Disp: 1 each, Rfl: 0   Budeson-Glycopyrrol-Formoterol  (BREZTRI AEROSPHERE) 160-9-4.8 MCG/ACT AERO, Inhale 2 puffs into the lungs in the morning and at bedtime., Disp: , Rfl:    clonazePAM  (KLONOPIN ) 0.5  MG tablet, TAKE 1 TO 2 TABLETS BY MOUTH AS NEEDED FOR SLEEP, Disp: 60 tablet, Rfl: 5   dapagliflozin  propanediol (FARXIGA ) 10 MG TABS tablet, Take 1 tablet (10 mg total) by mouth daily., Disp: 90 tablet, Rfl: 1   fluticasone  (FLONASE ) 50 MCG/ACT nasal spray, Place 1 spray into both nostrils daily., Disp: 16 g, Rfl: 2   lisinopril  (ZESTRIL ) 10 MG tablet, Take 1 tablet by mouth once daily, Disp: 100 tablet, Rfl: 0   loperamide (IMODIUM) 2 MG capsule, Take 4 mg by mouth as needed for diarrhea or loose stools., Disp: , Rfl:    meloxicam  (MOBIC ) 15 MG tablet, Take 1 tablet by mouth once daily, Disp: 100 tablet, Rfl: 0   MYRBETRIQ 50 MG TB24 tablet, Take 50 mg by mouth daily., Disp: , Rfl:    nirmatrelvir /ritonavir  (PAXLOVID ) 20 x 150 MG & 10 x 100MG  TABS, Take 3 tablets by mouth 2 (two) times daily for 5 days. (Take nirmatrelvir  150 mg two tablets twice daily for 5 days and ritonavir  100 mg one tablet twice daily for 5 days) Patient GFR is 60, Disp: 30 tablet, Rfl: 0   OVER THE COUNTER MEDICATION, Take 1 capsule by mouth daily. Vitamin D3, Disp: , Rfl:    pantoprazole  (PROTONIX ) 40 MG tablet, Take 1 tablet by mouth twice daily, Disp: 180 tablet, Rfl: 1   rOPINIRole  (REQUIP ) 0.25 MG tablet, TAKE 1 TO 2 TABLETS BY MOUTH BEFORE BEDTIME FOR  RESTLESS  LEG (Patient taking differently: Take 0.25-0.5 mg by mouth at bedtime as needed (RLS). TAKE 1 TO 2 TABLETS BY MOUTH BEFORE BEDTIME FOR  RESTLESS  LEG), Disp: 30 tablet, Rfl: 12   Semaglutide , 1 MG/DOSE, 4 MG/3ML SOPN, Inject 1 mg as directed once a week., Disp: 3 mL, Rfl: 2   sertraline  (ZOLOFT ) 100 MG tablet, Take 1 tablet (100 mg total) by mouth daily., Disp: 100 tablet, Rfl: 1   Tacrolimus  0.1 % CREA, Apply small amount to facial rash, Disp: 30 g, Rfl: 0   thyroid  (ARMOUR) 30 MG tablet, Take 30 mg by mouth daily before breakfast., Disp: , Rfl:    venlafaxine  (EFFEXOR ) 75 MG tablet, Take 1 tablet by mouth once daily, Disp: 100 tablet, Rfl: 0   vitamin B-12  (CYANOCOBALAMIN ) 1000 MCG tablet, Take 1,000 mcg by mouth daily., Disp: , Rfl:   Review of Systems:  Negative unless indicated in HPI.   Physical Exam: Vitals:   08/17/24 1052  BP: 120/80  Pulse: 95  SpO2: 97%  Height: 5' 3 (1.6 m)    Body mass index is 33.07 kg/m.     Impression and Plan:  COVID-19 -     POC COVID-19 BinaxNow -     nirmatrelvir /ritonavir ; Take 3 tablets by mouth 2 (two)  times daily for 5 days. (Take nirmatrelvir  150 mg two tablets twice daily for 5 days and ritonavir  100 mg one tablet twice daily for 5 days) Patient GFR is 60  Dispense: 30 tablet; Refill: 0  -She may also use OTC medications such as antihistamines, decongestants, pain relievers, guaifenesin. -We have reviewed quarantine period of 5 days. -We have discussed symptoms that would promote ED evaluation. -She knows to follow with us  if symptoms fail to resolve.    Time spent:22 minutes reviewing chart, interviewing and examining patient and formulating plan of care.     Tully [name], MD [facility] at [facility]

## 2024-09-09 ENCOUNTER — Encounter: Payer: Self-pay | Admitting: Nurse Practitioner

## 2024-09-09 ENCOUNTER — Ambulatory Visit (INDEPENDENT_AMBULATORY_CARE_PROVIDER_SITE_OTHER): Admitting: Internal Medicine

## 2024-09-09 ENCOUNTER — Encounter: Payer: Self-pay | Admitting: Internal Medicine

## 2024-09-09 ENCOUNTER — Ambulatory Visit: Admitting: Nurse Practitioner

## 2024-09-09 VITALS — BP 110/60 | HR 75 | Temp 98.2°F | Ht 61.5 in | Wt 191.9 lb

## 2024-09-09 VITALS — BP 143/84 | HR 69 | Temp 97.4°F | Ht 63.0 in | Wt 191.6 lb

## 2024-09-09 DIAGNOSIS — J4520 Mild intermittent asthma, uncomplicated: Secondary | ICD-10-CM | POA: Diagnosis not present

## 2024-09-09 DIAGNOSIS — Z0000 Encounter for general adult medical examination without abnormal findings: Secondary | ICD-10-CM

## 2024-09-09 DIAGNOSIS — E119 Type 2 diabetes mellitus without complications: Secondary | ICD-10-CM | POA: Diagnosis not present

## 2024-09-09 DIAGNOSIS — G4700 Insomnia, unspecified: Secondary | ICD-10-CM

## 2024-09-09 DIAGNOSIS — I10 Essential (primary) hypertension: Secondary | ICD-10-CM

## 2024-09-09 DIAGNOSIS — E7800 Pure hypercholesterolemia, unspecified: Secondary | ICD-10-CM

## 2024-09-09 DIAGNOSIS — Z23 Encounter for immunization: Secondary | ICD-10-CM

## 2024-09-09 DIAGNOSIS — J302 Other seasonal allergic rhinitis: Secondary | ICD-10-CM

## 2024-09-09 DIAGNOSIS — Z780 Asymptomatic menopausal state: Secondary | ICD-10-CM | POA: Diagnosis not present

## 2024-09-09 DIAGNOSIS — G4733 Obstructive sleep apnea (adult) (pediatric): Secondary | ICD-10-CM

## 2024-09-09 DIAGNOSIS — F5101 Primary insomnia: Secondary | ICD-10-CM

## 2024-09-09 LAB — POCT GLYCOSYLATED HEMOGLOBIN (HGB A1C): Hemoglobin A1C: 6.1 % — AB (ref 4.0–5.6)

## 2024-09-09 MED ORDER — CLONAZEPAM 0.5 MG PO TABS: ORAL_TABLET | ORAL | 0 refills | Status: AC

## 2024-09-09 MED ORDER — ATORVASTATIN CALCIUM 80 MG PO TABS: 80.0000 mg | ORAL_TABLET | Freq: Every day | ORAL | 1 refills | Status: AC

## 2024-09-09 NOTE — Patient Instructions (Addendum)
 Continue to use CPAP every night, minimum of 4-6 hours a night.  Change equipment as directed. Wash your tubing with warm soap and water  daily, hang to dry. Wash humidifier portion weekly. Use bottled, distilled water  and change daily Be aware of reduced alertness and do not drive or operate heavy machinery if experiencing this or drowsiness.  Exercise encouraged, as tolerated. Healthy weight management discussed.  Avoid or decrease alcohol consumption and medications that make you more sleepy, if possible. Notify if persistent daytime sleepiness occurs even with consistent use of PAP therapy.  Change CPAP supplies... Every month Mask cushions and/or nasal pillows CPAP machine filters Every 3 months Mask frame (not including the headgear) CPAP tubing Every 6 months Mask headgear Chin strap (if applicable) Humidifier water  tub   Continue clonazepam  nightly as needed for sleep. Take immediately before bed. Ensure you have 7-8 hours in the bed after taking. Monitor for any mood changes or changes in sleep habits. Stop and notify immediately if these occur. Do not drive or operate heavy machinery after taking. Do not take with alcohol or other sedating medications. Ensure you apply your CPAP within 5-10 minutes of taking to avoid falling asleep without it. May cause some morning grogginess or vivid dreams.    Continue Breztri 2 puffs Twice daily. Brush tongue and rinse mouth afterwards Continue Albuterol  inhaler 2 puffs every 6 hours as needed for shortness of breath or wheezing. Notify if symptoms persist despite rescue inhaler/neb use.  Continue flonase  nasal spray 2 sprays each nostril daily   Follow up in 6 months with Dr. Neda, Dr. Theodoro, Dr. Olena (new pt 30 min appt). If symptoms do not improve or worsen, please contact office for sooner follow up or seek emergency care.

## 2024-09-09 NOTE — Addendum Note (Signed)
 Addended by: CHRISTYNE IDELL LABOR on: 09/09/2024 12:09 PM   Modules accepted: Orders

## 2024-09-09 NOTE — Assessment & Plan Note (Signed)
 Moderate OSA on CPAP. Excellent compliance and control. Receives benefit from use. Understands risks of untreated OSA and proper care of PAP device. Encouraged to continue utilizing nightly. Aware to ensure CPAP is on after taking any sedating medications, as these could worsen severity of OSA. Safe driving practices reviewed. Healthy weight management encouraged.  Patient Instructions  Continue to use CPAP every night, minimum of 4-6 hours a night.  Change equipment as directed. Wash your tubing with warm soap and water  daily, hang to dry. Wash humidifier portion weekly. Use bottled, distilled water  and change daily Be aware of reduced alertness and do not drive or operate heavy machinery if experiencing this or drowsiness.  Exercise encouraged, as tolerated. Healthy weight management discussed.  Avoid or decrease alcohol consumption and medications that make you more sleepy, if possible. Notify if persistent daytime sleepiness occurs even with consistent use of PAP therapy.  Change CPAP supplies... Every month Mask cushions and/or nasal pillows CPAP machine filters Every 3 months Mask frame (not including the headgear) CPAP tubing Every 6 months Mask headgear Chin strap (if applicable) Humidifier water  tub   Continue clonazepam  nightly as needed for sleep. Take immediately before bed. Ensure you have 7-8 hours in the bed after taking. Monitor for any mood changes or changes in sleep habits. Stop and notify immediately if these occur. Do not drive or operate heavy machinery after taking. Do not take with alcohol or other sedating medications. Ensure you apply your CPAP within 5-10 minutes of taking to avoid falling asleep without it. May cause some morning grogginess or vivid dreams.    Continue Breztri 2 puffs Twice daily. Brush tongue and rinse mouth afterwards Continue Albuterol  inhaler 2 puffs every 6 hours as needed for shortness of breath or wheezing. Notify if symptoms persist  despite rescue inhaler/neb use.  Continue flonase  nasal spray 2 sprays each nostril daily   Follow up in 6 months with Dr. Neda, Dr. Theodoro, Dr. Olena (new pt 30 min appt). If symptoms do not improve or worsen, please contact office for sooner follow up or seek emergency care.

## 2024-09-09 NOTE — Progress Notes (Signed)
 "    Established Patient Office Visit     CC/Reason for Visit: Annual preventive exam  HPI: Kayla [name] is a 63 y.o. female who is coming in today for the above mentioned reasons. Past Medical History is significant for: Hypertension, hyperlipidemia, type 2 diabetes, GERD, obesity, vitamin D  deficiency.  Feeling well without acute concerns or complaints.  Is due for DEXA, flu and COVID vaccines.  Is overdue for an eye exam.   Past Medical/Surgical History: Past Medical History:  Diagnosis Date   Allergy     Asthma    Carpal tunnel syndrome of right wrist 10/2012   Complication of anesthesia    hard to wake up after surg. 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HCC)    prothrombin gene factor II (06/05/2012); states has had no problems   GERD (gastroesophageal reflux disease)    Headache(784.0)    due to allergies   Heart murmur    states no problems   Hyperlipidemia    Hypertension    Mitral valve prolapse    OSA on CPAP    Osteoarthritis    knees   Overactive bladder    PTSD (post-traumatic stress disorder)    Sleep apnea    Type II diabetes mellitus (HCC)    NIDDM    Past Surgical History:  Procedure Laterality Date   ARTHRODESIS METATARSALPHALANGEAL JOINT (MTPJ) Right 08/24/2020   Procedure: ARTHRODESIS OF THE METATARSOPHALGEAL JOINT RIGHT FOOT;  Surgeon: Blinda Katz, DPM;  Location: [facility];  Service: Podiatry;  Laterality: Right;   BREAST BIOPSY Left 04/25/2001   CARPAL TUNNEL RELEASE Right 10/24/2012   Procedure: CARPAL TUNNEL RELEASE;  Surgeon: Toribio JULIANNA Chancy, MD;  Location: [facility];  Service: Orthopedics;  Laterality: Right;  RIGHT CARPAL TUNNEL RELEASE   KNEE ARTHROSCOPY Right 04/01/2008   KNEE ARTHROSCOPY W/ MENISCECTOMY Left 08/16/2011   partial medial   LASIK     bilateral   PUBOVAGINAL SLING  08/27/2001   cysto;  suprapubic tube placement   SHOULDER ARTHROSCOPY W/ ROTATOR CUFF REPAIR Left 2004   TOTAL ABDOMINAL HYSTERECTOMY  08/27/2001   TOTAL KNEE ARTHROPLASTY  02/06/2012   Procedure: TOTAL KNEE ARTHROPLASTY;  Surgeon: Toribio JULIANNA Chancy, MD;  Location: [facility] OR;  Service: Orthopedics;  Laterality: Right;  total knee right side   TOTAL KNEE ARTHROPLASTY  06/04/2012   Procedure: TOTAL KNEE ARTHROPLASTY;  Surgeon: Toribio JULIANNA Chancy, MD;  Location: [facility];  Service: Orthopedics;  Laterality: Left;   ULNAR COLLATERAL LIGAMENT REPAIR Left 11/08/2017   Procedure: REPAIR/RECONSTRUCTION LEFT THUMB ULNAR COLLATERAL LIGAMENT;  Surgeon: Murrell Kuba, MD;  Location: [facility];  Service: Orthopedics;  Laterality: Left;   VAGINAL HYSTERECTOMY  1980's    Social History:  reports that she has never smoked. She has never used smokeless tobacco. She reports that she does not drink alcohol and does not use drugs.  Allergies: Allergies[1]  Family History:  Family History  Problem Relation Age of Onset   COPD Mother    Heart disease Mother    Heart disease Father    Heart attack Brother    Colon cancer Neg Hx    Stomach cancer Neg Hx    Esophageal cancer Neg Hx    Pancreatic cancer Neg Hx     Current Medications[2]  Review of Systems:  Negative unless indicated in HPI.  Physical Exam: Vitals:   09/09/24 1128  BP: 110/60  Pulse: 75  Temp: 98.2 F (36.8 C)  TempSrc: Oral  SpO2: 98%  Weight: 191 lb 14.4 oz (87 kg)  Height: 5' 1.5 (1.562 m)    Body mass index is 35.67 kg/m.   Physical Exam Vitals reviewed.  Constitutional:      General: She is not in acute distress.    Appearance: Normal appearance. She is not ill-appearing, toxic-appearing or diaphoretic.  HENT:     Head: Normocephalic.     Right Ear: Tympanic membrane, ear canal and external ear normal. There is no impacted cerumen.     Left Ear: Tympanic membrane, ear canal and external ear normal. There is no impacted cerumen.      Nose: Nose normal.     Mouth/Throat:     Mouth: Mucous membranes are moist.     Pharynx: Oropharynx is clear. No oropharyngeal exudate or posterior oropharyngeal erythema.  Eyes:     General: No scleral icterus.       Right eye: No discharge.        Left eye: No discharge.     Conjunctiva/sclera: Conjunctivae normal.     Pupils: Pupils are equal, round, and reactive to light.  Neck:     Vascular: No carotid bruit.  Cardiovascular:     Rate and Rhythm: Normal rate and regular rhythm.     Pulses: Normal pulses.     Heart sounds: Normal heart sounds.  Pulmonary:     Effort: Pulmonary effort is normal. No respiratory distress.     Breath sounds: Normal breath sounds.  Abdominal:     General: Abdomen is flat. Bowel sounds are normal.     Palpations: Abdomen is soft.  Musculoskeletal:        General: Normal range of motion.     Cervical back: Normal range of motion.  Skin:    General: Skin is warm and dry.  Neurological:     General: No focal deficit present.     Mental Status: She is alert and oriented to person, place, and time. Mental status is at baseline.  Psychiatric:        Mood and Affect: Mood normal.        Behavior: Behavior normal.        Thought Content: Thought content normal.        Judgment: Judgment normal.      Impression and Plan:  Encounter for preventive health examination  Diabetes mellitus with coincident hypertension (HCC) -     POCT glycosylated hemoglobin (Hb A1C) -     Collection capillary blood specimen -     Comprehensive metabolic panel with GFR -     CBC with Differential/Platelet; Future -     Microalbumin / creatinine urine ratio; Future  Pure hypercholesterolemia -     Atorvastatin  Calcium ; Take 1 tablet (80 mg total) by mouth daily.  Dispense: 90 tablet; Refill: 1 -     Lipid panel; Future  HYPERTENSION, BENIGN ESSENTIAL  Morbid obesity (HCC) -     TSH; Future  Immunization due  Postmenopausal estrogen deficiency -     DG  Bone Density; Future   -Recommend routine eye and dental care. -Healthy lifestyle discussed in detail. -Labs to be updated today. -Prostate cancer screening: Not applicable Health Maintenance  Topic Date Due   Yearly kidney health urinalysis for diabetes  Never done   Pneumococcal Vaccine for age over 37 (3 of 3 -  PCV20 or PCV21) 08/29/2021   Osteoporosis screening with Bone Density Scan  Never done   Flu Shot  04/10/2024   COVID-19 Vaccine (5 - 2025-26 season) 05/11/2024   Eye exam for diabetics  06/20/2024   Medicare Annual Wellness Visit  09/30/2024   Yearly kidney function blood test for diabetes  10/07/2024   HIV Screening  09/09/2025*   Complete foot exam   10/07/2024   Hemoglobin A1C  03/09/2025   Breast Cancer Screening  12/19/2025   DTaP/Tdap/Td vaccine (3 - Td or Tdap) 04/28/2030   Colon Cancer Screening  11/14/2031   Hepatitis C Screening  Completed   Zoster (Shingles) Vaccine  Completed   Hepatitis B Vaccine  Aged Out   Meningitis B Vaccine  Aged Out  *Topic was postponed. The date shown is not the original due date.     -Flu vaccine in office today. - DEXA requested.  Advised to obtain COVID at pharmacy and to schedule eye exam.   Tully [name], MD [facility] at [facility]     [1]  Allergies Allergen Reactions   Morphine  And Codeine Shortness Of Breath and Itching   Ibuprofen Itching  [2]  Current Outpatient Medications:    albuterol  (PROVENTIL  HFA) 108 (90 Base) MCG/ACT inhaler, Inhale 2 puffs into the lungs every 6 (six) hours as needed for wheezing or shortness of breath., Disp: 18 g, Rfl: 3   blood glucose meter kit and supplies KIT, Dispense based on patient and insurance preference. Use up to four times daily as directed., Disp: 1 each, Rfl: 0   Budeson-Glycopyrrol-Formoterol  (BREZTRI AEROSPHERE) 160-9-4.8 MCG/ACT AERO, Inhale 2 puffs into the lungs in the morning and at bedtime., Disp: , Rfl:    clonazePAM  (KLONOPIN ) 0.5 MG  tablet, TAKE 1 TO 2 TABLETS BY MOUTH AS NEEDED FOR SLEEP, Disp: 60 tablet, Rfl: 0   dapagliflozin  propanediol (FARXIGA ) 10 MG TABS tablet, Take 1 tablet (10 mg total) by mouth daily., Disp: 90 tablet, Rfl: 1   fluticasone  (FLONASE ) 50 MCG/ACT nasal spray, Place 1 spray into both nostrils daily. (Patient taking differently: Place 1 spray into both nostrils daily as needed.), Disp: 16 g, Rfl: 2   lisinopril  (ZESTRIL ) 10 MG tablet, Take 1 tablet by mouth once daily, Disp: 100 tablet, Rfl: 0   loperamide (IMODIUM) 2 MG capsule, Take 4 mg by mouth as needed for diarrhea or loose stools., Disp: , Rfl:    meloxicam  (MOBIC ) 15 MG tablet, Take 1 tablet by mouth once daily, Disp: 100 tablet, Rfl: 0   OVER THE COUNTER MEDICATION, Take 1 capsule by mouth daily. Vitamin D3, Disp: , Rfl:    pantoprazole  (PROTONIX ) 40 MG tablet, Take 1 tablet by mouth twice daily, Disp: 180 tablet, Rfl: 1   Semaglutide , 1 MG/DOSE, 4 MG/3ML SOPN, Inject 1 mg as directed once a week., Disp: 3 mL, Rfl: 2   sertraline  (ZOLOFT ) 100 MG tablet, Take 1 tablet (100 mg total) by mouth daily., Disp: 100 tablet, Rfl: 1   Tacrolimus  0.1 % CREA, Apply small amount to facial rash, Disp: 30 g, Rfl: 0   thyroid  (ARMOUR) 30 MG tablet, Take 30 mg by mouth daily before breakfast., Disp: , Rfl:    venlafaxine  (EFFEXOR ) 75 MG tablet, Take 1 tablet by mouth once daily, Disp: 100 tablet, Rfl: 0   vitamin B-12 (CYANOCOBALAMIN ) 1000 MCG tablet, Take 1,000 mcg by mouth daily., Disp: , Rfl:    atorvastatin  (LIPITOR) 80 MG tablet, Take 1 tablet (80 mg total)  by mouth daily., Disp: 90 tablet, Rfl: 1  "

## 2024-09-09 NOTE — Progress Notes (Signed)
 "  @Patient  ID: Zebedee JONELLE Retort, female    DOB: 04/27/1961, 63 y.o.   MRN: [id number]  Chief Complaint  Patient presents with   Obstructive Sleep Apnea    Pt states she uses machine every night, no concerns to address.    Referring provider: [name], Tully GRADE, MD  HPI: 63 year old female, never smoker followed for OSA on CPAP, asthma, insomnia. She is a patient of Dr. Saundra and last seen in office 09/16/2023. Past medical history significant for HTN, DM, allergies, GERD, HLD, RLS, depression.   TEST/EVENTS:  01/30/2011 NPSG: AHI 19.9/h, SpO2 low 88% >> CPAP titrated to 9 cmH2O 01/04/2017 FeNO 42 01/04/2017 spiro: FVC 78%, FEV1 82%, ratio 82  09/16/2023: OV with Dr. Neysa. CPAP compliance reviewed. Took her other machine on her recent cruise then skipped last 3-4 nights with developing sinus infection. Has had more of a productive cough with yellow sputum and nasal congestion for the last week. Frontal sinus headache present. Azithromycin  prescribed. Also on symbicort  and albuterol  PRN for asthma. She takes clonazepam  for sleep.   09/09/2024: Today - follow up Discussed the use of AI scribe software for clinical note transcription with the patient, who gave verbal consent to proceed.  History of Present Illness ROSALINE EZEKIEL is a 63 year old female with sleep apnea who presents for a follow-up on CPAP therapy and respiratory symptoms.  Her CPAP therapy is going well with usage as needed and no significant issues with pressures/mask fit. She sometimes struggles with the device at night but feels it helps her sleep better once she adjusts to it. Her energy levels during the day are satisfactory. No drowsy driving.   She experiences a sensation of mucus in her throat, which she does not cough up and is a chronic problem for her. She uses over-the-counter Mucinex and flonase , which she finds helpful. No hemoptysis, fevers, sinus pressure/pain, purulent mucus. She reports  occasional wheezing, particularly with colder weather, sometimes leading to increased use of her rescue inhaler. She used it daily for a week but has not needed it in the past week. Feels well today. No current wheezing, cough, chest tightness, PND.   She is currently on Breztri, having switched from Symbicort  by her PCP, and finds it effective in managing her symptoms.  She uses clonazepam  at night as needed for sleep. Tolerating well without any mood changes. She does not take this every night. Aware to not drive after taking or mix with other sedating medications.      Allergies[1]  Immunization History  Administered Date(s) Administered   Influenza Inj Mdck Quad Pf 06/25/2019   Influenza Split 08/03/2011, 05/20/2012, 06/10/2013   Influenza Whole 08/01/2010   Influenza, Seasonal, Injecte, Preservative Fre 10/08/2023   Influenza,inj,Quad PF,6+ Mos 06/17/2014, 06/24/2015, 05/08/2017, 08/20/2018, 07/04/2021, 05/28/2022   Influenza,inj,quad, With Preservative 06/14/2014, 07/25/2016   Influenza-Unspecified 07/17/2016, 09/10/2020   Moderna Sars-Covid-2 Vaccination 01/16/2020, 02/13/2020, 09/10/2020, 10/05/2020   Pneumococcal Conjugate-13 07/04/2021   Pneumococcal Polysaccharide-23 06/06/2012   Td 05/12/2009   Tdap 04/28/2020   Zoster Recombinant(Shingrix ) 08/20/2018, 11/19/2018    Past Medical History:  Diagnosis Date   Allergy     Asthma    Carpal tunnel syndrome of right wrist 10/2012   Complication of anesthesia    hard to wake up after surg. 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HCC)  prothrombin gene factor II (06/05/2012); states has had no problems   GERD (gastroesophageal reflux disease)    Headache(784.0)    due to allergies   Heart murmur    states no problems   Hyperlipidemia    Hypertension    Mitral valve prolapse    OSA on CPAP    Osteoarthritis     knees   Overactive bladder    PTSD (post-traumatic stress disorder)    Sleep apnea    Type II diabetes mellitus (HCC)    NIDDM    Tobacco History: Tobacco Use History[2] Counseling given: Not Answered   Outpatient Medications Prior to Visit  Medication Sig Dispense Refill   albuterol  (PROVENTIL  HFA) 108 (90 Base) MCG/ACT inhaler Inhale 2 puffs into the lungs every 6 (six) hours as needed for wheezing or shortness of breath. 18 g 3   atorvastatin  (LIPITOR) 80 MG tablet Take 1 tablet (80 mg total) by mouth daily. 90 tablet 1   blood glucose meter kit and supplies KIT Dispense based on patient and insurance preference. Use up to four times daily as directed. 1 each 0   Budeson-Glycopyrrol-Formoterol  (BREZTRI AEROSPHERE) 160-9-4.8 MCG/ACT AERO Inhale 2 puffs into the lungs in the morning and at bedtime.     dapagliflozin  propanediol (FARXIGA ) 10 MG TABS tablet Take 1 tablet (10 mg total) by mouth daily. 90 tablet 1   fluticasone  (FLONASE ) 50 MCG/ACT nasal spray Place 1 spray into both nostrils daily. (Patient taking differently: Place 1 spray into both nostrils daily as needed.) 16 g 2   lisinopril  (ZESTRIL ) 10 MG tablet Take 1 tablet by mouth once daily 100 tablet 0   loperamide (IMODIUM) 2 MG capsule Take 4 mg by mouth as needed for diarrhea or loose stools.     meloxicam  (MOBIC ) 15 MG tablet Take 1 tablet by mouth once daily 100 tablet 0   OVER THE COUNTER MEDICATION Take 1 capsule by mouth daily. Vitamin D3     pantoprazole  (PROTONIX ) 40 MG tablet Take 1 tablet by mouth twice daily 180 tablet 1   Semaglutide , 1 MG/DOSE, 4 MG/3ML SOPN Inject 1 mg as directed once a week. 3 mL 2   sertraline  (ZOLOFT ) 100 MG tablet Take 1 tablet (100 mg total) by mouth daily. 100 tablet 1   Tacrolimus  0.1 % CREA Apply small amount to facial rash 30 g 0   thyroid  (ARMOUR) 30 MG tablet Take 30 mg by mouth daily before breakfast.     venlafaxine  (EFFEXOR ) 75 MG tablet Take 1 tablet by mouth once daily 100  tablet 0   vitamin B-12 (CYANOCOBALAMIN ) 1000 MCG tablet Take 1,000 mcg by mouth daily.     clonazePAM  (KLONOPIN ) 0.5 MG tablet TAKE 1 TO 2 TABLETS BY MOUTH AS NEEDED FOR SLEEP 60 tablet 5   atorvastatin  (LIPITOR) 40 MG tablet Take 1 tablet by mouth once daily (Patient not taking: Reported on 09/09/2024) 90 tablet 1   MYRBETRIQ 50 MG TB24 tablet Take 50 mg by mouth daily. (Patient not taking: Reported on 09/09/2024)     rOPINIRole  (REQUIP ) 0.25 MG tablet TAKE 1 TO 2 TABLETS BY MOUTH BEFORE BEDTIME FOR  RESTLESS  LEG (Patient not taking: Reported on 09/09/2024) 30 tablet 12   No facility-administered medications prior to visit.     Review of Systems: as above    Physical Exam:  BP (!) 143/84 Comment: rechecked due to elevated BP at initial reading  Pulse 69   Temp (!) 97.4 F (36.3 C)  Ht 5' 3 (1.6 m) Comment: Per pt  Wt 191 lb 9.6 oz (86.9 kg)   SpO2 98% Comment: RA  BMI 33.94 kg/m   GEN: Pleasant, interactive, well-appearing; obese; in no acute distress HEENT:  Normocephalic and atraumatic. PERRLA. Sclera white. Nasal turbinates pink, moist and patent bilaterally. No rhinorrhea present. Oropharynx pink and moist, without exudate or edema. No lesions, ulcerations, or postnasal drip.  NECK:  Supple w/ fair ROM.  CV: RRR, no m/r/g PULMONARY:  Unlabored, regular breathing. Clear bilaterally A&P w/o wheezes/rales/rhonchi. No accessory muscle use.  GI: BS present and normoactive. Soft, non-tender to palpation.  Neuro: A/Ox3. No focal deficits noted.   Skin: Warm, no lesions or rashe Psych: Normal affect and behavior. Judgement and thought content appropriate.     Lab Results:  CBC    Component Value Date/Time   WBC 8.5 10/08/2023 1104   RBC 5.37 (H) 10/08/2023 1104   HGB 15.2 (H) 10/08/2023 1104   HCT 45.8 10/08/2023 1104   PLT 262.0 10/08/2023 1104   MCV 85.2 10/08/2023 1104   MCH 29.0 04/28/2020 1513   MCHC 33.2 10/08/2023 1104   RDW 14.2 10/08/2023 1104    LYMPHSABS 3.2 10/08/2023 1104   MONOABS 0.7 10/08/2023 1104   EOSABS 0.3 10/08/2023 1104   BASOSABS 0.1 10/08/2023 1104    BMET    Component Value Date/Time   NA 138 10/08/2023 1104   K 3.9 10/08/2023 1104   CL 100 10/08/2023 1104   CO2 28 10/08/2023 1104   GLUCOSE 130 (H) 10/08/2023 1104   BUN 15 10/08/2023 1104   CREATININE 1.04 10/08/2023 1104   CREATININE 0.99 04/28/2020 1513   CALCIUM  10.0 10/08/2023 1104   GFRNONAA 75 (L) 10/20/2012 1610   GFRAA 87 (L) 10/20/2012 1610    BNP No results found for: BNP   Imaging:  No results found.  Administration History     None           No data to display          Lab Results  Component Value Date   NITRICOXIDE 42 01/04/2017        Assessment & Plan:   Obstructive sleep apnea Moderate OSA on CPAP. Excellent compliance and control. Receives benefit from use. Understands risks of untreated OSA and proper care of PAP device. Encouraged to continue utilizing nightly. Aware to ensure CPAP is on after taking any sedating medications, as these could worsen severity of OSA. Safe driving practices reviewed. Healthy weight management encouraged.  Patient Instructions  Continue to use CPAP every night, minimum of 4-6 hours a night.  Change equipment as directed. Wash your tubing with warm soap and water  daily, hang to dry. Wash humidifier portion weekly. Use bottled, distilled water  and change daily Be aware of reduced alertness and do not drive or operate heavy machinery if experiencing this or drowsiness.  Exercise encouraged, as tolerated. Healthy weight management discussed.  Avoid or decrease alcohol consumption and medications that make you more sleepy, if possible. Notify if persistent daytime sleepiness occurs even with consistent use of PAP therapy.  Change CPAP supplies... Every month Mask cushions and/or nasal pillows CPAP machine filters Every 3 months Mask frame (not including the headgear) CPAP  tubing Every 6 months Mask headgear Chin strap (if applicable) Humidifier water  tub   Continue clonazepam  nightly as needed for sleep. Take immediately before bed. Ensure you have 7-8 hours in the bed after taking. Monitor for any mood changes or changes in sleep  habits. Stop and notify immediately if these occur. Do not drive or operate heavy machinery after taking. Do not take with alcohol or other sedating medications. Ensure you apply your CPAP within 5-10 minutes of taking to avoid falling asleep without it. May cause some morning grogginess or vivid dreams.    Continue Breztri 2 puffs Twice daily. Brush tongue and rinse mouth afterwards Continue Albuterol  inhaler 2 puffs every 6 hours as needed for shortness of breath or wheezing. Notify if symptoms persist despite rescue inhaler/neb use.  Continue flonase  nasal spray 2 sprays each nostril daily   Follow up in 6 months with Dr. Neda, Dr. Theodoro, Dr. Olena (new pt 30 min appt). If symptoms do not improve or worsen, please contact office for sooner follow up or seek emergency care.    Allergic asthma, mild intermittent, uncomplicated Well controlled on current regimen. Recent increase in SABA use related to cold weather, now resolved. Action plan in place.   Seasonal and perennial allergic rhinitis Stable on current regimen  INSOMNIA Utilizing clonazepam  as needed. Tolerating well. No aberrant behavior. Refilled today. PDMP reviewed. Aware of side effect/safety profile.    Advised if symptoms do not improve or worsen, to please contact office for sooner follow up or seek emergency care.   I spent 25 minutes of dedicated to the care of this patient on the date of this encounter to include pre-visit review of records, face-to-face time with the patient discussing conditions above, post visit ordering of testing, clinical documentation with the electronic health record, making appropriate referrals as documented, and communicating  necessary findings to members of the patients care team.  Comer LULLA Rouleau, NP 09/09/2024  Pt aware and understands NP's role.      [1]  Allergies Allergen Reactions   Morphine  And Codeine Shortness Of Breath and Itching   Ibuprofen Itching  [2]  Social History Tobacco Use  Smoking Status Never  Smokeless Tobacco Never   "

## 2024-09-09 NOTE — Assessment & Plan Note (Signed)
"  Stable on current regimen.   "

## 2024-09-09 NOTE — Assessment & Plan Note (Signed)
 Utilizing clonazepam  as needed. Tolerating well. No aberrant behavior. Refilled today. PDMP reviewed. Aware of side effect/safety profile.

## 2024-09-09 NOTE — Assessment & Plan Note (Signed)
 Well controlled on current regimen. Recent increase in SABA use related to cold weather, now resolved. Action plan in place.

## 2024-09-15 ENCOUNTER — Ambulatory Visit: Payer: Medicare Other | Admitting: Internal Medicine

## 2024-09-21 ENCOUNTER — Other Ambulatory Visit

## 2024-09-21 DIAGNOSIS — I10 Essential (primary) hypertension: Secondary | ICD-10-CM | POA: Diagnosis not present

## 2024-09-21 DIAGNOSIS — E119 Type 2 diabetes mellitus without complications: Secondary | ICD-10-CM | POA: Diagnosis not present

## 2024-09-21 DIAGNOSIS — E7800 Pure hypercholesterolemia, unspecified: Secondary | ICD-10-CM

## 2024-09-21 LAB — MICROALBUMIN / CREATININE URINE RATIO
Creatinine,U: 52.9 mg/dL
Microalb Creat Ratio: UNDETERMINED mg/g (ref 0.0–30.0)
Microalb, Ur: <0.7 mg/dL

## 2024-09-22 ENCOUNTER — Ambulatory Visit: Payer: Self-pay | Admitting: Internal Medicine

## 2024-09-23 LAB — CBC WITH DIFFERENTIAL/PLATELET
Basophils Absolute: 0.1 x10E3/uL (ref 0.0–0.2)
Basos: 1 %
EOS (ABSOLUTE): 0.3 x10E3/uL (ref 0.0–0.4)
Eos: 3 %
Hematocrit: 44.5 % (ref 34.0–46.6)
Hemoglobin: 14.7 g/dL (ref 11.1–15.9)
Immature Grans (Abs): 0 x10E3/uL (ref 0.0–0.1)
Immature Granulocytes: 0 %
Lymphocytes Absolute: 4 x10E3/uL — ABNORMAL HIGH (ref 0.7–3.1)
Lymphs: 46 %
MCH: 28.1 pg (ref 26.6–33.0)
MCHC: 33 g/dL (ref 31.5–35.7)
MCV: 85 fL (ref 79–97)
Monocytes Absolute: 0.6 x10E3/uL (ref 0.1–0.9)
Monocytes: 7 %
Neutrophils Absolute: 3.7 x10E3/uL (ref 1.4–7.0)
Neutrophils: 43 %
Platelets: 264 x10E3/uL (ref 150–450)
RBC: 5.24 x10E6/uL (ref 3.77–5.28)
RDW: 14.2 % (ref 11.7–15.4)
WBC: 8.6 x10E3/uL (ref 3.4–10.8)

## 2024-09-23 LAB — TSH: TSH: 2.37 u[IU]/mL (ref 0.450–4.500)

## 2024-09-23 LAB — LIPID PANEL
Chol/HDL Ratio: 2.6 ratio (ref 0.0–4.4)
Cholesterol, Total: 147 mg/dL (ref 100–199)
HDL: 57 mg/dL
LDL Chol Calc (NIH): 69 mg/dL (ref 0–99)
Triglycerides: 117 mg/dL (ref 0–149)
VLDL Cholesterol Cal: 21 mg/dL (ref 5–40)

## 2024-09-29 ENCOUNTER — Other Ambulatory Visit: Payer: Self-pay | Admitting: Internal Medicine

## 2024-09-29 DIAGNOSIS — N951 Menopausal and female climacteric states: Secondary | ICD-10-CM

## 2024-09-29 DIAGNOSIS — F339 Major depressive disorder, recurrent, unspecified: Secondary | ICD-10-CM

## 2024-10-01 ENCOUNTER — Encounter: Payer: Self-pay | Admitting: Family Medicine

## 2024-10-01 ENCOUNTER — Ambulatory Visit: Admitting: Family Medicine

## 2024-10-01 DIAGNOSIS — Z0000 Encounter for general adult medical examination without abnormal findings: Secondary | ICD-10-CM

## 2024-10-01 NOTE — Patient Instructions (Addendum)
 I really enjoyed getting to talk with you today! I am available on Tuesdays and Thursdays for virtual visits if you have any questions or concerns, or if I can be of any further assistance.   CHECKLIST FROM ANNUAL WELLNESS VISIT:  -Follow up (please call to schedule if not scheduled after visit):   -yearly for annual wellness visit with primary care office  Here is a list of your preventive care/health maintenance measures and the plan for each if any are due:  PLAN For any measures below that may be due:    1. Can get vaccines at the pharmacy. If you do, please let us  know so that we can update your record.   2. Please schedule eye exam.   3. Please call to check on the status of your bone density test. Thanks.   Health Maintenance  Topic Date Due   Pneumococcal Vaccine: 50+ Years (3 of 3 - PCV20 or PCV21) 08/29/2021   Bone Density Scan  Never done   COVID-19 Vaccine (5 - 2025-26 season) 05/11/2024   OPHTHALMOLOGY EXAM  06/20/2024   Medicare Annual Wellness (AWV)  09/30/2024   Diabetic kidney evaluation - eGFR measurement  10/07/2024   HIV Screening  09/09/2025 (Originally 12/27/1975)   FOOT EXAM  10/07/2024   HEMOGLOBIN A1C  03/09/2025   Diabetic kidney evaluation - Urine ACR  09/21/2025   Mammogram  12/19/2025   DTaP/Tdap/Td (3 - Td or Tdap) 04/28/2030   Colonoscopy  11/14/2031   Influenza Vaccine  Completed   Hepatitis C Screening  Completed   Zoster Vaccines- Shingrix   Completed   Hepatitis B Vaccines 19-59 Average Risk  Aged Out   Meningococcal B Vaccine  Aged Out    -See a dentist at least yearly  -Get your eyes checked and then per your eye specialist's recommendations  -Other issues addressed today:   -I have included below further information regarding a healthy whole foods based diet, physical activity guidelines for adults, stress management and opportunities for social connections. I hope you find this information useful.    -----------------------------------------------------------------------------------------------------------------------------------------------------------------------------------------------------------------------------------------------------------    NUTRITION: -eat real food: lots of colorful vegetables (half the plate) and fruits -5-7 servings of vegetables and fruits per day (fresh or steamed is best), exp. 2 servings of vegetables with lunch and dinner and 2 servings of fruit per day. Berries and greens such as kale and collards are great choices.  -consume on a regular basis:  fresh fruits, fresh veggies, fish, nuts, seeds, healthy oils (such as olive oil, avocado oil), whole grains (make sure for bread/pasta/crackers/etc., that the first ingredient on label contains the word whole), legumes. -can eat small amounts of dairy and lean meat (no larger than the palm of your hand), but avoid processed meats such as ham, bacon, lunch meat, etc. -drink water  -try to avoid fast food and pre-packaged foods, processed meat, ultra processed foods/beverages (donuts, candy, etc.) -most experts advise limiting sodium to < 2300mg  per day, should limit further is any chronic conditions such as high blood pressure, heart disease, diabetes, etc. The American Heart Association advised that < 1500mg  is is ideal -try to avoid foods/beverages that contain any ingredients with names you do not recognize  -try to avoid foods/beverages  with added sugar or sweeteners/sweets  -try to avoid sweet drinks (including diet drinks): soda, juice, Gatorade, sweet tea, power drinks, diet drinks -try to avoid white rice, white bread, pasta (unless whole grain)  EXERCISE GUIDELINES FOR ADULTS: -if you wish to increase your  physical activity, do so gradually and with the approval of your doctor -STOP and seek medical care immediately if you have any chest pain, chest discomfort or trouble breathing when starting or  increasing exercise  -move and stretch your body, legs, feet and arms when sitting for long periods -Physical activity guidelines for optimal health in adults: -get at least 150 minutes per week of moderate exercise (can talk, but not sing); this is about 20-30 minutes of sustained activity 5-7 days per week or two 10-15 minute episodes of sustained activity 5-7 days per week -do some muscle building/resistance training/strength training at least 2 days per week  -balance exercises 3+ days per week:   Stand somewhere where you have something sturdy to hold onto if you lose balance    1) lift up on toes, then back down, start with 5x per day and work up to 20x   2) stand and lift one leg straight out to the side so that foot is a few inches of the floor, start with 5x each side and work up to 20x each side   3) stand on one foot, start with 5 seconds each side and work up to 20 seconds on each side  If you need ideas or help with getting more active:  -Silver sneakers https://tools.silversneakers.com  -Walk with a Doc: Http://www.duncan-williams.com/  -try to include resistance (weight lifting/strength building) and balance exercises twice per week: or the following link for ideas: http://castillo-powell.com/  buyducts.dk  STRESS MANAGEMENT: -can try meditating, or just sitting quietly with deep breathing while intentionally relaxing all parts of your body for 5 minutes daily -if you need further help with stress, anxiety or depression please follow up with your primary doctor or contact the wonderful folks at [facility]: [phone]  SOCIAL CONNECTIONS: -options in [place] if you wish to engage in more social and exercise related activities:  -Silver sneakers https://tools.silversneakers.com  -Walk with a Doc: Http://www.duncan-williams.com/  -Check out the [facility] Active Adults 50+  section on the [place] [employer] (hiking clubs, book clubs, cards and games, chess, exercise classes, aquatic classes and much more) - see the website for details: https://www.[place].gov/departments/parks-recreation/active-adults50  -YouTube has lots of exercise videos for different ages and abilities as well  -[facility] (a variety of indoor and outdoor inperson activities for adults). [phone]. [address].  -Virtual Online Classes (a variety of topics): see seniorplanet.org or call [phone]) [phone]  -consider volunteering at a school, hospice center, church, senior center or elsewhere

## 2024-10-01 NOTE — Progress Notes (Signed)
 " ----------------------------------------------------------------------------------------------------------------------------------------------------------------------------------------------------------------------  Because this visit was a virtual/telehealth visit, some criteria may be missing or patient reported. Any vitals not documented were not able to be obtained and vitals that have been documented are patient reported.    MEDICARE ANNUAL PREVENTIVE VISIT WITH PROVIDER: (Welcome to [facility], initial annual wellness or annual wellness exam)  Virtual Visit via Video Note  I connected with Kayla [name] on 10/01/24 by a video enabled telemedicine application and verified that I am speaking with the correct person using two identifiers. Video kept cutting out so completed in audio only.   Location patient: home Location provider:work or home office Persons participating in the virtual visit: patient, provider  Concerns and/or follow up today: detailed intake and risks/health assessment completed in flowsheets and below - please see for details.   How often do you have a drink containing alcohol?never How many drinks containing alcohol do you have on a typical day when you are drinking?na How often do you have six or more drinks on one occasion?na Have you ever smoked?never Quit date if applicable? na  How many packs a day do/did you smoke? na Do you use smokeless tobacco?n Do you use an illicit drugs?n Do you feel safe at home?y Last dentist visit?goes every 6 months Last eye Exam and location?my eye doctor, [place], agrees to make appt   See HM section in Epic for other details of completed HM.    ROS: negative for report of fevers, unintentional weight loss, vision changes, vision loss, hearing loss or change, chest pain, sob, hemoptysis, melena, hematochezia, hematuria or bleeding or bruising  Patient-completed extensive health risk assessment - reviewed and  discussed with the patient: See Health Risk Assessment completed with patient prior to the visit either above or in recent phone note. This was reviewed in detailed with the patient today and appropriate recommendations, orders and referrals were placed as needed per Summary below and patient instructions.   Review of Medical History: -PMH, PSH, Family History and current specialty and care providers reviewed and updated and listed below   Patient Care Team: [name], Tully GRADE, MD as PCP - [facility], [facility]    Past Medical History:  Diagnosis Date   Allergy     Asthma    Carpal tunnel syndrome of right wrist 10/2012   Complication of anesthesia    hard to wake up after surg. 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HCC)    prothrombin gene factor II (06/05/2012); states has had no problems   GERD (gastroesophageal reflux disease)    Headache(784.0)    due to allergies   Heart murmur    states no problems   Hyperlipidemia    Hypertension    Mitral valve prolapse    OSA on CPAP    Osteoarthritis    knees   Overactive bladder    PTSD (post-traumatic stress disorder)    Sleep apnea    Type II diabetes mellitus (HCC)    NIDDM    Past Surgical History:  Procedure Laterality Date   ARTHRODESIS METATARSALPHALANGEAL JOINT (MTPJ) Right 08/24/2020   Procedure: ARTHRODESIS OF THE METATARSOPHALGEAL JOINT RIGHT FOOT;  Surgeon: Blinda Katz, DPM;  Location: [facility];  Service: Podiatry;  Laterality: Right;   BREAST BIOPSY Left 04/25/2001   CARPAL TUNNEL RELEASE Right 10/24/2012   Procedure: CARPAL TUNNEL RELEASE;  Surgeon: Toribio JULIANNA Chancy, MD;  Location: [facility];  Service: Orthopedics;  Laterality: Right;  RIGHT CARPAL TUNNEL RELEASE   KNEE ARTHROSCOPY Right 04/01/2008   KNEE ARTHROSCOPY W/ MENISCECTOMY  Left 08/16/2011   partial medial   LASIK     bilateral   PUBOVAGINAL SLING  08/27/2001   cysto; suprapubic tube placement   SHOULDER ARTHROSCOPY W/ ROTATOR CUFF REPAIR Left 2004   TOTAL ABDOMINAL HYSTERECTOMY  08/27/2001   TOTAL KNEE ARTHROPLASTY  02/06/2012   Procedure: TOTAL KNEE ARTHROPLASTY;  Surgeon: Toribio JULIANNA Chancy, MD;  Location: [facility];  Service: Orthopedics;  Laterality: Right;  total knee right side   TOTAL KNEE ARTHROPLASTY  06/04/2012   Procedure: TOTAL KNEE ARTHROPLASTY;  Surgeon: Toribio JULIANNA Chancy, MD;  Location: [facility] OR;  Service: Orthopedics;  Laterality: Left;   ULNAR COLLATERAL LIGAMENT REPAIR Left 11/08/2017   Procedure: REPAIR/RECONSTRUCTION LEFT THUMB ULNAR COLLATERAL LIGAMENT;  Surgeon: Murrell Kuba, MD;  Location: [facility];  Service: Orthopedics;  Laterality: Left;   VAGINAL HYSTERECTOMY  1980's    Social History   Socioeconomic History   Marital status: Married    Spouse name: Not on file   Number of children: 2   Years of education: Not on file   Highest education level: Some college, no degree  Occupational History   Occupation: Chiropodist  Tobacco Use   Smoking status: Never   Smokeless tobacco: Never  Vaping Use   Vaping status: Never Used  Substance and Sexual Activity   Alcohol use: No   Drug use: No   Sexual activity: Yes  Other Topics Concern   Not on file  Social History Narrative   Not on file   Social Drivers of Health   Tobacco Use: Low Risk (10/01/2024)   Patient History    Smoking Tobacco Use: Never    Smokeless Tobacco Use: Never    Passive Exposure: Not on file  Financial Resource Strain: Medium Risk (10/01/2024)   Overall Financial Resource Strain (CARDIA)    Difficulty of Paying Living Expenses: Somewhat hard  Food Insecurity: No Food Insecurity (10/01/2024)   Epic    Worried About Programme Researcher, Broadcasting/film/video in the Last Year: Never true    Ran Out of Food in the Last Year: Never true  Recent Concern: Food Insecurity  - Food Insecurity Present (08/16/2024)   Epic    Worried About Programme Researcher, Broadcasting/film/video in the Last Year: Sometimes true    Ran Out of Food in the Last Year: Sometimes true  Transportation Needs: No Transportation Needs (10/01/2024)   Epic    Lack of Transportation (Medical): No    Lack of Transportation (Non-Medical): No  Physical Activity: Insufficiently Active (10/01/2024)   Exercise Vital Sign    Days of Exercise per Week: 2 days    Minutes of Exercise per Session: 30 min  Stress: No Stress Concern Present (10/01/2024)   [employer] of Occupational Health - Occupational Stress Questionnaire    Feeling of Stress: Not at all  Social Connections: Socially Integrated (10/01/2024)   Social Connection and Isolation Panel    Frequency of Communication with Friends and Family: More than three times a week    Frequency of Social Gatherings with Friends and Family: Once a week    Attends Religious Services: More than 4 times per year    Active Member of [employer] or Organizations: Yes    Attends Banker Meetings: More than 4 times per year  Marital Status: Married  Catering Manager Violence: Not At Risk (10/01/2024)   Epic    Fear of Current or Ex-Partner: No    Emotionally Abused: No    Physically Abused: No    Sexually Abused: No  Depression (PHQ2-9): Low Risk (10/01/2024)   Depression (PHQ2-9)    PHQ-2 Score: 0  Alcohol Screen: Low Risk (10/01/2024)   Alcohol Screen    Last Alcohol Screening Score (AUDIT): 0  Housing: Low Risk (10/01/2024)   Epic    Unable to Pay for Housing in the Last Year: No    Number of Times Moved in the Last Year: 0    Homeless in the Last Year: No  Utilities: Not At Risk (10/01/2024)   Epic    Threatened with loss of utilities: No  Health Literacy: Adequate Health Literacy (10/01/2024)   B1300 Health Literacy    Frequency of need for help with medical instructions: Never    Family History  Problem Relation Age of Onset   COPD Mother    Heart  disease Mother    Heart disease Father    Heart attack Brother    Colon cancer Neg Hx    Stomach cancer Neg Hx    Esophageal cancer Neg Hx    Pancreatic cancer Neg Hx     Medications Ordered Prior to Encounter[1]  Allergies[2]     Physical Exam Vitals requested from patient and listed below if patient had equipment and was able to obtain at home for this virtual visit: There were no vitals filed for this visit. Estimated body mass index is 35.67 kg/m as calculated from the following:   Height as of 09/09/24: 5' 1.5 (1.562 m).   Weight as of 09/09/24: 191 lb 14.4 oz (87 kg).  EKG (optional): deferred due to virtual visit  GENERAL: alert, oriented, no acute distress detected, full vision exam deferred due to pandemic and/or virtual encounter  PSYCH/NEURO: pleasant and cooperative, no obvious depression or anxiety, speech and thought processing grossly intact, Cognitive function grossly intact  Flowsheet Row Office Visit from 04/13/2024 in [facility] [place]  PHQ-9 Total Score 1        10/01/2024    1:15 PM 10/01/2024   11:04 AM 09/09/2024   11:31 AM 04/13/2024   11:16 AM 01/06/2024    9:19 AM  Depression screen PHQ 2/9  Decreased Interest 0 0 0 0 0  Down, Depressed, Hopeless 0 0 0 0 0  PHQ - 2 Score 0 0 0 0 0  Altered sleeping    1 0  Tired, decreased energy    0 1  Change in appetite    0 0  Feeling bad or failure about yourself     0 0  Trouble concentrating    0 0  Moving slowly or fidgety/restless    0 0  Suicidal thoughts    0 0  PHQ-9 Score    1  1   Difficult doing work/chores    Not difficult at all      Data saved with a previous flowsheet row definition       10/08/2023   10:26 AM 01/06/2024    9:19 AM 04/13/2024   11:16 AM 10/01/2024   11:04 AM 10/01/2024    1:14 PM  Fall Risk  Falls in the past year? 0 0 0 0   Was there an injury with Fall? 0  0  0  0   Fall Risk Category Calculator  0 0 0 0   Patient at Risk for Falls Due to     No Fall Risks No Fall Risks  Fall risk Follow up  Falls evaluation completed Falls evaluation completed Falls evaluation completed Falls evaluation completed     Data saved with a previous flowsheet row definition     SUMMARY AND PLAN:  Encounter for Medicare annual wellness exam  Discussed applicable health maintenance/preventive health measures and advised and referred or ordered per patient preferences: -discussed vaccines due recs and risks -advised on eye exam - she agrees to schedule -she says PCP ordered bone density - orders in, she agrees to call office to check on this Health Maintenance  Topic Date Due   Pneumococcal Vaccine: 50+ Years (3 of 3 - PCV20 or PCV21) 08/29/2021   Bone Density Scan  Never done   COVID-19 Vaccine (5 - 2025-26 season) 05/11/2024   OPHTHALMOLOGY EXAM  06/20/2024   Diabetic kidney evaluation - eGFR measurement  10/07/2024   HIV Screening  09/09/2025 (Originally 12/27/1975)   FOOT EXAM  10/07/2024   HEMOGLOBIN A1C  03/09/2025   Diabetic kidney evaluation - Urine ACR  09/21/2025   Medicare Annual Wellness (AWV)  10/01/2025   Mammogram  12/19/2025   DTaP/Tdap/Td (3 - Td or Tdap) 04/28/2030   Colonoscopy  11/14/2031   Influenza Vaccine  Completed   Hepatitis C Screening  Completed   Zoster Vaccines- Shingrix   Completed   Hepatitis B Vaccines 19-59 Average Risk  Aged Out   Meningococcal B Vaccine  Aged Out      Education and counseling on the following was provided based on the above review of health and a plan/checklist for the patient, along with additional information discussed, was provided for the patient in the patient instructions :   -Advised and counseled on a healthy lifestyle - including the importance of a healthy diet, regular physical activity, social connections and stress management. -Reviewed patient's current diet. Advised and counseled on a whole foods based healthy diet. A summary of a healthy diet was provided in the  Patient Instructions.  -reviewed patient's current physical activity level and discussed exercise guidelines for adults. Discussed community resources and ideas for safe exercise at home to assist in meeting exercise guideline recommendations in a safe and healthy way.  -Advise yearly dental visits at minimum and regular eye exams  Follow up: see patient instructions     Patient Instructions  I really enjoyed getting to talk with you today! I am available on Tuesdays and Thursdays for virtual visits if you have any questions or concerns, or if I can be of any further assistance.   CHECKLIST FROM ANNUAL WELLNESS VISIT:  -Follow up (please call to schedule if not scheduled after visit):   -yearly for annual wellness visit with primary care office  Here is a list of your preventive care/health maintenance measures and the plan for each if any are due:  PLAN For any measures below that may be due:    1. Can get vaccines at the pharmacy. If you do, please let us  know so that we can update your record.   2. Please schedule eye exam.   3. Please call to check on the status of your bone density test. Thanks.   Health Maintenance  Topic Date Due   Pneumococcal Vaccine: 50+ Years (3 of 3 - PCV20 or PCV21) 08/29/2021   Bone Density Scan  Never done   COVID-19 Vaccine (5 - 2025-26 season) 05/11/2024  OPHTHALMOLOGY EXAM  06/20/2024   Medicare Annual Wellness (AWV)  09/30/2024   Diabetic kidney evaluation - eGFR measurement  10/07/2024   HIV Screening  09/09/2025 (Originally 12/27/1975)   FOOT EXAM  10/07/2024   HEMOGLOBIN A1C  03/09/2025   Diabetic kidney evaluation - Urine ACR  09/21/2025   Mammogram  12/19/2025   DTaP/Tdap/Td (3 - Td or Tdap) 04/28/2030   Colonoscopy  11/14/2031   Influenza Vaccine  Completed   Hepatitis C Screening  Completed   Zoster Vaccines- Shingrix   Completed   Hepatitis B Vaccines 19-59 Average Risk  Aged Out   Meningococcal B Vaccine  Aged Out    -See a  dentist at least yearly  -Get your eyes checked and then per your eye specialist's recommendations  -Other issues addressed today:   -I have included below further information regarding a healthy whole foods based diet, physical activity guidelines for adults, stress management and opportunities for social connections. I hope you find this information useful.   -----------------------------------------------------------------------------------------------------------------------------------------------------------------------------------------------------------------------------------------------------------    NUTRITION: -eat real food: lots of colorful vegetables (half the plate) and fruits -5-7 servings of vegetables and fruits per day (fresh or steamed is best), exp. 2 servings of vegetables with lunch and dinner and 2 servings of fruit per day. Berries and greens such as kale and collards are great choices.  -consume on a regular basis:  fresh fruits, fresh veggies, fish, nuts, seeds, healthy oils (such as olive oil, avocado oil), whole grains (make sure for bread/pasta/crackers/etc., that the first ingredient on label contains the word whole), legumes. -can eat small amounts of dairy and lean meat (no larger than the palm of your hand), but avoid processed meats such as ham, bacon, lunch meat, etc. -drink water  -try to avoid fast food and pre-packaged foods, processed meat, ultra processed foods/beverages (donuts, candy, etc.) -most experts advise limiting sodium to < 2300mg  per day, should limit further is any chronic conditions such as high blood pressure, heart disease, diabetes, etc. The American Heart Association advised that < 1500mg  is is ideal -try to avoid foods/beverages that contain any ingredients with names you do not recognize  -try to avoid foods/beverages  with added sugar or sweeteners/sweets  -try to avoid sweet drinks (including diet drinks): soda, juice, Gatorade,  sweet tea, power drinks, diet drinks -try to avoid white rice, white bread, pasta (unless whole grain)  EXERCISE GUIDELINES FOR ADULTS: -if you wish to increase your physical activity, do so gradually and with the approval of your doctor -STOP and seek medical care immediately if you have any chest pain, chest discomfort or trouble breathing when starting or increasing exercise  -move and stretch your body, legs, feet and arms when sitting for long periods -Physical activity guidelines for optimal health in adults: -get at least 150 minutes per week of moderate exercise (can talk, but not sing); this is about 20-30 minutes of sustained activity 5-7 days per week or two 10-15 minute episodes of sustained activity 5-7 days per week -do some muscle building/resistance training/strength training at least 2 days per week  -balance exercises 3+ days per week:   Stand somewhere where you have something sturdy to hold onto if you lose balance    1) lift up on toes, then back down, start with 5x per day and work up to 20x   2) stand and lift one leg straight out to the side so that foot is a few inches of the floor, start with 5x each side and work  up to 20x each side   3) stand on one foot, start with 5 seconds each side and work up to 20 seconds on each side  If you need ideas or help with getting more active:  -Silver sneakers https://tools.silversneakers.com  -Walk with a Doc: Http://www.duncan-williams.com/  -try to include resistance (weight lifting/strength building) and balance exercises twice per week: or the following link for ideas: http://castillo-powell.com/  buyducts.dk  STRESS MANAGEMENT: -can try meditating, or just sitting quietly with deep breathing while intentionally relaxing all parts of your body for 5 minutes daily -if you need further help with stress, anxiety or depression please follow  up with your primary doctor or contact the wonderful folks at [facility]: [phone]  SOCIAL CONNECTIONS: -options in [place] if you wish to engage in more social and exercise related activities:  -Silver sneakers https://tools.silversneakers.com  -Walk with a Doc: Http://www.duncan-williams.com/  -Check out the [facility] Active Adults 50+ section on the [place] of [employer] companies (hiking clubs, book clubs, cards and games, chess, exercise classes, aquatic classes and much more) - see the website for details: https://www.[place].gov/departments/parks-recreation/active-adults50  -YouTube has lots of exercise videos for different ages and abilities as well  -[facility] (a variety of indoor and outdoor inperson activities for adults). [phone]. [address].  -Virtual Online Classes (a variety of topics): see seniorplanet.org or call [phone]  -consider volunteering at a school, hospice center, church, senior center or elsewhere            Chiquita JONELLE Cramp, DO      [1]  Current Outpatient Medications on File Prior to Visit  Medication Sig Dispense Refill   albuterol  (PROVENTIL  HFA) 108 (90 Base) MCG/ACT inhaler Inhale 2 puffs into the lungs every 6 (six) hours as needed for wheezing or shortness of breath. 18 g 3   atorvastatin  (LIPITOR) 80 MG tablet Take 1 tablet (80 mg total) by mouth daily. 90 tablet 1   blood glucose meter kit and supplies KIT Dispense based on patient and insurance preference. Use up to four times daily as directed. 1 each 0   Budeson-Glycopyrrol-Formoterol  (BREZTRI AEROSPHERE) 160-9-4.8 MCG/ACT AERO Inhale 2 puffs into the lungs in the morning and at bedtime.     clonazePAM  (KLONOPIN ) 0.5 MG tablet TAKE 1 TO 2 TABLETS BY MOUTH AS NEEDED FOR SLEEP 60 tablet 0   dapagliflozin  propanediol (FARXIGA ) 10 MG TABS tablet Take 1 tablet (10 mg total) by mouth daily. 90 tablet 1   fluticasone  (FLONASE ) 50 MCG/ACT  nasal spray Place 1 spray into both nostrils daily. (Patient taking differently: Place 1 spray into both nostrils daily as needed.) 16 g 2   lisinopril  (ZESTRIL ) 10 MG tablet Take 1 tablet by mouth once daily 100 tablet 0   loperamide (IMODIUM) 2 MG capsule Take 4 mg by mouth as needed for diarrhea or loose stools.     meloxicam  (MOBIC ) 15 MG tablet Take 1 tablet by mouth once daily 100 tablet 0   OVER THE COUNTER MEDICATION Take 1 capsule by mouth daily. Vitamin D3     pantoprazole  (PROTONIX ) 40 MG tablet Take 1 tablet by mouth twice daily 180 tablet 1   Semaglutide , 1 MG/DOSE, 4 MG/3ML SOPN Inject 1 mg as directed once a week. 3 mL 2   sertraline  (ZOLOFT ) 100 MG tablet Take 1 tablet by mouth once daily 100 tablet 1   Tacrolimus  0.1 % CREA Apply small amount to facial rash 30 g 0   thyroid  (ARMOUR) 30 MG  tablet Take 30 mg by mouth daily before breakfast.     venlafaxine  (EFFEXOR ) 75 MG tablet Take 1 tablet by mouth once daily 100 tablet 1   vitamin B-12 (CYANOCOBALAMIN ) 1000 MCG tablet Take 1,000 mcg by mouth daily.     No current facility-administered medications on file prior to visit.  [2]  Allergies Allergen Reactions   Morphine  And Codeine Shortness Of Breath and Itching   Ibuprofen Itching   "

## 2025-03-08 ENCOUNTER — Encounter: Admitting: Pulmonary Disease
